# Patient Record
Sex: Female | Born: 1940
Health system: Southern US, Community
[De-identification: ages and names within clinical notes are randomized; demographics above are authoritative.]

## PROBLEM LIST (undated history)

## (undated) DIAGNOSIS — E876 Hypokalemia: Secondary | ICD-10-CM

## (undated) DIAGNOSIS — R748 Abnormal levels of other serum enzymes: Secondary | ICD-10-CM

## (undated) DIAGNOSIS — G709 Myoneural disorder, unspecified: Secondary | ICD-10-CM

## (undated) DIAGNOSIS — R011 Cardiac murmur, unspecified: Secondary | ICD-10-CM

## (undated) DIAGNOSIS — M1710 Unilateral primary osteoarthritis, unspecified knee: Secondary | ICD-10-CM

## (undated) DIAGNOSIS — I4891 Unspecified atrial fibrillation: Secondary | ICD-10-CM

## (undated) DIAGNOSIS — J309 Allergic rhinitis, unspecified: Secondary | ICD-10-CM

## (undated) DIAGNOSIS — Z8719 Personal history of other diseases of the digestive system: Secondary | ICD-10-CM

## (undated) DIAGNOSIS — R978 Other abnormal tumor markers: Secondary | ICD-10-CM

## (undated) DIAGNOSIS — H269 Unspecified cataract: Secondary | ICD-10-CM

## (undated) DIAGNOSIS — E119 Type 2 diabetes mellitus without complications: Secondary | ICD-10-CM

## (undated) DIAGNOSIS — M48061 Spinal stenosis, lumbar region without neurogenic claudication: Secondary | ICD-10-CM

## (undated) DIAGNOSIS — T7840XA Allergy, unspecified, initial encounter: Secondary | ICD-10-CM

## (undated) DIAGNOSIS — I10 Essential (primary) hypertension: Secondary | ICD-10-CM

## (undated) DIAGNOSIS — K219 Gastro-esophageal reflux disease without esophagitis: Secondary | ICD-10-CM

## (undated) DIAGNOSIS — M810 Age-related osteoporosis without current pathological fracture: Secondary | ICD-10-CM

## (undated) DIAGNOSIS — E785 Hyperlipidemia, unspecified: Secondary | ICD-10-CM

## (undated) DIAGNOSIS — M545 Low back pain: Secondary | ICD-10-CM

## (undated) DIAGNOSIS — E039 Hypothyroidism, unspecified: Secondary | ICD-10-CM

## (undated) HISTORY — DX: Allergic rhinitis, unspecified: J30.9

## (undated) HISTORY — DX: Hypokalemia: E87.6

## (undated) HISTORY — DX: Gastro-esophageal reflux disease without esophagitis: K21.9

## (undated) HISTORY — DX: Age-related osteoporosis without current pathological fracture: M81.0

## (undated) HISTORY — DX: Unspecified atrial fibrillation: I48.91

## (undated) HISTORY — DX: Personal history of other diseases of the digestive system: Z87.19

## (undated) HISTORY — PX: BACK SURGERY: SHX140

## (undated) HISTORY — DX: Myoneural disorder, unspecified: G70.9

## (undated) HISTORY — DX: Type 2 diabetes mellitus without complications: E11.9

## (undated) HISTORY — DX: Allergy, unspecified, initial encounter: T78.40XA

## (undated) HISTORY — PX: JOINT REPLACEMENT: SHX530

## (undated) HISTORY — DX: Hyperlipidemia, unspecified: E78.5

## (undated) HISTORY — DX: Unspecified cataract: H26.9

## (undated) HISTORY — DX: Other abnormal tumor markers: R97.8

## (undated) HISTORY — DX: Hypothyroidism, unspecified: E03.9

## (undated) HISTORY — DX: Low back pain: M54.5

## (undated) HISTORY — DX: Unilateral primary osteoarthritis, unspecified knee: M17.10

## (undated) HISTORY — DX: Essential (primary) hypertension: I10

## (undated) HISTORY — DX: Spinal stenosis, lumbar region without neurogenic claudication: M48.061

---

## 1997-09-09 ENCOUNTER — Ambulatory Visit (HOSPITAL_COMMUNITY): Admission: RE | Admit: 1997-09-09 | Discharge: 1997-09-09 | Payer: Self-pay | Admitting: *Deleted

## 1999-03-28 ENCOUNTER — Encounter: Payer: Self-pay | Admitting: Family Medicine

## 1999-03-28 ENCOUNTER — Ambulatory Visit (HOSPITAL_COMMUNITY): Admission: RE | Admit: 1999-03-28 | Discharge: 1999-03-28 | Payer: Self-pay | Admitting: Family Medicine

## 1999-08-02 ENCOUNTER — Encounter: Payer: Self-pay | Admitting: *Deleted

## 1999-08-02 ENCOUNTER — Encounter: Admission: RE | Admit: 1999-08-02 | Discharge: 1999-08-02 | Payer: Self-pay | Admitting: *Deleted

## 1999-08-15 ENCOUNTER — Encounter: Admission: RE | Admit: 1999-08-15 | Discharge: 1999-08-15 | Payer: Self-pay | Admitting: *Deleted

## 1999-08-15 ENCOUNTER — Encounter: Payer: Self-pay | Admitting: *Deleted

## 1999-08-22 ENCOUNTER — Other Ambulatory Visit: Admission: RE | Admit: 1999-08-22 | Discharge: 1999-08-22 | Payer: Self-pay | Admitting: Obstetrics and Gynecology

## 2000-08-26 ENCOUNTER — Encounter: Payer: Self-pay | Admitting: Internal Medicine

## 2000-08-26 ENCOUNTER — Ambulatory Visit (HOSPITAL_COMMUNITY): Admission: RE | Admit: 2000-08-26 | Discharge: 2000-08-26 | Payer: Self-pay | Admitting: Internal Medicine

## 2000-10-01 ENCOUNTER — Other Ambulatory Visit: Admission: RE | Admit: 2000-10-01 | Discharge: 2000-10-01 | Payer: Self-pay | Admitting: Obstetrics and Gynecology

## 2001-08-27 ENCOUNTER — Encounter: Payer: Self-pay | Admitting: Internal Medicine

## 2001-08-27 ENCOUNTER — Ambulatory Visit (HOSPITAL_COMMUNITY): Admission: RE | Admit: 2001-08-27 | Discharge: 2001-08-27 | Payer: Self-pay | Admitting: Internal Medicine

## 2001-10-06 ENCOUNTER — Other Ambulatory Visit: Admission: RE | Admit: 2001-10-06 | Discharge: 2001-10-06 | Payer: Self-pay | Admitting: Obstetrics and Gynecology

## 2002-07-18 ENCOUNTER — Emergency Department (HOSPITAL_COMMUNITY): Admission: EM | Admit: 2002-07-18 | Discharge: 2002-07-18 | Payer: Self-pay | Admitting: Emergency Medicine

## 2002-07-18 ENCOUNTER — Encounter: Payer: Self-pay | Admitting: Emergency Medicine

## 2002-12-31 ENCOUNTER — Encounter: Admission: RE | Admit: 2002-12-31 | Discharge: 2002-12-31 | Payer: Self-pay | Admitting: Internal Medicine

## 2002-12-31 ENCOUNTER — Encounter: Payer: Self-pay | Admitting: Internal Medicine

## 2003-02-23 ENCOUNTER — Other Ambulatory Visit: Admission: RE | Admit: 2003-02-23 | Discharge: 2003-02-23 | Payer: Self-pay | Admitting: Obstetrics and Gynecology

## 2003-02-23 ENCOUNTER — Encounter: Payer: Self-pay | Admitting: Obstetrics and Gynecology

## 2003-02-23 ENCOUNTER — Ambulatory Visit (HOSPITAL_COMMUNITY): Admission: RE | Admit: 2003-02-23 | Discharge: 2003-02-23 | Payer: Self-pay | Admitting: Obstetrics and Gynecology

## 2004-03-06 ENCOUNTER — Ambulatory Visit (HOSPITAL_COMMUNITY): Admission: RE | Admit: 2004-03-06 | Discharge: 2004-03-06 | Payer: Self-pay | Admitting: Obstetrics and Gynecology

## 2004-04-09 ENCOUNTER — Other Ambulatory Visit: Admission: RE | Admit: 2004-04-09 | Discharge: 2004-04-09 | Payer: Self-pay | Admitting: Obstetrics and Gynecology

## 2004-05-09 ENCOUNTER — Inpatient Hospital Stay (HOSPITAL_COMMUNITY): Admission: RE | Admit: 2004-05-09 | Discharge: 2004-05-12 | Payer: Self-pay | Admitting: Orthopedic Surgery

## 2004-07-15 HISTORY — PX: TOTAL KNEE ARTHROPLASTY: SHX125

## 2005-04-30 ENCOUNTER — Ambulatory Visit (HOSPITAL_COMMUNITY): Admission: RE | Admit: 2005-04-30 | Discharge: 2005-04-30 | Payer: Self-pay | Admitting: Internal Medicine

## 2005-08-30 ENCOUNTER — Other Ambulatory Visit: Admission: RE | Admit: 2005-08-30 | Discharge: 2005-08-30 | Payer: Self-pay | Admitting: Internal Medicine

## 2006-04-14 LAB — HM COLONOSCOPY: HM Colonoscopy: NORMAL

## 2006-05-20 ENCOUNTER — Ambulatory Visit (HOSPITAL_COMMUNITY): Admission: RE | Admit: 2006-05-20 | Discharge: 2006-05-20 | Payer: Self-pay | Admitting: Internal Medicine

## 2007-04-29 ENCOUNTER — Encounter: Payer: Self-pay | Admitting: Internal Medicine

## 2007-04-29 ENCOUNTER — Ambulatory Visit: Payer: Self-pay | Admitting: Internal Medicine

## 2007-04-29 DIAGNOSIS — E785 Hyperlipidemia, unspecified: Secondary | ICD-10-CM | POA: Insufficient documentation

## 2007-04-29 DIAGNOSIS — E039 Hypothyroidism, unspecified: Secondary | ICD-10-CM | POA: Insufficient documentation

## 2007-04-29 DIAGNOSIS — M545 Low back pain, unspecified: Secondary | ICD-10-CM

## 2007-04-29 DIAGNOSIS — I10 Essential (primary) hypertension: Secondary | ICD-10-CM

## 2007-04-29 DIAGNOSIS — M48061 Spinal stenosis, lumbar region without neurogenic claudication: Secondary | ICD-10-CM

## 2007-04-29 DIAGNOSIS — Z8719 Personal history of other diseases of the digestive system: Secondary | ICD-10-CM

## 2007-04-29 DIAGNOSIS — J309 Allergic rhinitis, unspecified: Secondary | ICD-10-CM

## 2007-04-29 HISTORY — DX: Personal history of other diseases of the digestive system: Z87.19

## 2007-04-29 HISTORY — DX: Hyperlipidemia, unspecified: E78.5

## 2007-04-29 HISTORY — DX: Spinal stenosis, lumbar region without neurogenic claudication: M48.061

## 2007-04-29 HISTORY — DX: Allergic rhinitis, unspecified: J30.9

## 2007-04-29 HISTORY — DX: Low back pain, unspecified: M54.50

## 2007-04-29 HISTORY — DX: Essential (primary) hypertension: I10

## 2007-04-29 HISTORY — DX: Hypothyroidism, unspecified: E03.9

## 2007-04-29 LAB — CONVERTED CEMR LAB
AST: 47 units/L — ABNORMAL HIGH (ref 0–37)
Basophils Absolute: 0 10*3/uL (ref 0.0–0.1)
Bilirubin Urine: NEGATIVE
Bilirubin, Direct: 0.3 mg/dL (ref 0.0–0.3)
Chloride: 104 meq/L (ref 96–112)
Creatinine, Ser: 0.8 mg/dL (ref 0.4–1.2)
Glucose, Bld: 228 mg/dL — ABNORMAL HIGH (ref 70–99)
HCT: 43.6 % (ref 36.0–46.0)
Hemoglobin, Urine: NEGATIVE
Hemoglobin: 15 g/dL (ref 12.0–15.0)
Hgb A1c MFr Bld: 9.9 % — ABNORMAL HIGH (ref 4.6–6.0)
Lymphocytes Relative: 31.4 % (ref 12.0–46.0)
MCHC: 34.4 g/dL (ref 30.0–36.0)
MCV: 91.1 fL (ref 78.0–100.0)
Monocytes Absolute: 0.5 10*3/uL (ref 0.2–0.7)
Neutrophils Relative %: 55.3 % (ref 43.0–77.0)
Nitrite: NEGATIVE
RDW: 12.4 % (ref 11.5–14.6)
Sodium: 141 meq/L (ref 135–145)
Total Bilirubin: 1.2 mg/dL (ref 0.3–1.2)
Total Protein, Urine: NEGATIVE mg/dL
Total Protein: 6.7 g/dL (ref 6.0–8.3)
pH: 5.5 (ref 5.0–8.0)

## 2007-05-05 ENCOUNTER — Ambulatory Visit: Payer: Self-pay | Admitting: Internal Medicine

## 2007-05-12 ENCOUNTER — Encounter: Payer: Self-pay | Admitting: Internal Medicine

## 2007-05-12 DIAGNOSIS — M810 Age-related osteoporosis without current pathological fracture: Secondary | ICD-10-CM

## 2007-05-12 HISTORY — DX: Age-related osteoporosis without current pathological fracture: M81.0

## 2007-05-26 ENCOUNTER — Encounter: Payer: Self-pay | Admitting: Internal Medicine

## 2007-05-28 ENCOUNTER — Ambulatory Visit (HOSPITAL_COMMUNITY): Admission: RE | Admit: 2007-05-28 | Discharge: 2007-05-28 | Payer: Self-pay | Admitting: Obstetrics and Gynecology

## 2007-06-23 ENCOUNTER — Ambulatory Visit: Payer: Self-pay | Admitting: Internal Medicine

## 2007-06-23 DIAGNOSIS — E119 Type 2 diabetes mellitus without complications: Secondary | ICD-10-CM | POA: Insufficient documentation

## 2007-06-23 DIAGNOSIS — IMO0002 Reserved for concepts with insufficient information to code with codable children: Secondary | ICD-10-CM

## 2007-06-23 DIAGNOSIS — M1710 Unilateral primary osteoarthritis, unspecified knee: Secondary | ICD-10-CM

## 2007-06-23 DIAGNOSIS — E1122 Type 2 diabetes mellitus with diabetic chronic kidney disease: Secondary | ICD-10-CM | POA: Insufficient documentation

## 2007-06-23 HISTORY — DX: Reserved for concepts with insufficient information to code with codable children: IMO0002

## 2007-06-23 HISTORY — DX: Type 2 diabetes mellitus without complications: E11.9

## 2007-06-25 LAB — CONVERTED CEMR LAB
Calcium: 9.1 mg/dL (ref 8.4–10.5)
Chloride: 103 meq/L (ref 96–112)
GFR calc non Af Amer: 77 mL/min
Hgb A1c MFr Bld: 8.4 % — ABNORMAL HIGH (ref 4.6–6.0)
LDL Cholesterol: 93 mg/dL (ref 0–99)
VLDL: 14 mg/dL (ref 0–40)

## 2007-07-21 ENCOUNTER — Telehealth (INDEPENDENT_AMBULATORY_CARE_PROVIDER_SITE_OTHER): Payer: Self-pay | Admitting: *Deleted

## 2007-08-11 ENCOUNTER — Encounter: Payer: Self-pay | Admitting: Internal Medicine

## 2007-10-13 ENCOUNTER — Ambulatory Visit: Payer: Self-pay | Admitting: Internal Medicine

## 2007-10-13 LAB — CONVERTED CEMR LAB
CO2: 31 meq/L (ref 19–32)
Calcium: 8.7 mg/dL (ref 8.4–10.5)
Chloride: 106 meq/L (ref 96–112)
Glucose, Bld: 174 mg/dL — ABNORMAL HIGH (ref 70–99)
HDL: 58.5 mg/dL (ref >39.0)
Sodium: 141 meq/L (ref 135–145)
Triglycerides: 79 mg/dL (ref 0–149)

## 2007-10-20 ENCOUNTER — Ambulatory Visit: Payer: Self-pay | Admitting: Internal Medicine

## 2007-12-16 ENCOUNTER — Encounter: Payer: Self-pay | Admitting: Internal Medicine

## 2008-04-19 ENCOUNTER — Ambulatory Visit: Payer: Self-pay | Admitting: Internal Medicine

## 2008-04-20 LAB — CONVERTED CEMR LAB
AST: 32 units/L (ref 0–37)
Albumin: 3.7 g/dL (ref 3.5–5.2)
Alkaline Phosphatase: 52 units/L (ref 39–117)
Basophils Absolute: 0 10*3/uL (ref 0.0–0.1)
Bilirubin Urine: NEGATIVE
Chloride: 105 meq/L (ref 96–112)
Cholesterol: 173 mg/dL (ref 0–200)
Eosinophils Absolute: 0.1 10*3/uL (ref 0.0–0.7)
GFR calc Af Amer: 93 mL/min
GFR calc non Af Amer: 77 mL/min
HCT: 37.5 % (ref 36.0–46.0)
HDL: 56 mg/dL (ref >39.0)
Hemoglobin, Urine: NEGATIVE
Ketones, ur: NEGATIVE mg/dL
LDL Cholesterol: 98 mg/dL (ref 0–99)
MCHC: 34.2 g/dL (ref 30.0–36.0)
MCV: 93.4 fL (ref 78.0–100.0)
Monocytes Absolute: 0.4 10*3/uL (ref 0.1–1.0)
Neutrophils Relative %: 55.4 % (ref 43.0–77.0)
Platelets: 185 10*3/uL (ref 150–400)
Potassium: 4.4 meq/L (ref 3.5–5.1)
RDW: 12.9 % (ref 11.5–14.6)
Sodium: 143 meq/L (ref 135–145)
Total Bilirubin: 1 mg/dL (ref 0.3–1.2)
Urine Glucose: NEGATIVE mg/dL
Urobilinogen, UA: 0.2 (ref 0.0–1.0)
VLDL: 19 mg/dL (ref 0–40)
WBC: 3.1 10*3/uL — ABNORMAL LOW (ref 4.5–10.5)

## 2008-04-27 ENCOUNTER — Ambulatory Visit: Payer: Self-pay | Admitting: Internal Medicine

## 2008-05-31 ENCOUNTER — Ambulatory Visit (HOSPITAL_COMMUNITY): Admission: RE | Admit: 2008-05-31 | Discharge: 2008-05-31 | Payer: Self-pay | Admitting: Obstetrics and Gynecology

## 2008-08-16 ENCOUNTER — Encounter: Payer: Self-pay | Admitting: Internal Medicine

## 2008-10-18 ENCOUNTER — Ambulatory Visit: Payer: Self-pay | Admitting: Internal Medicine

## 2008-10-18 LAB — CONVERTED CEMR LAB
BUN: 23 mg/dL (ref 6–23)
Chloride: 106 meq/L (ref 96–112)
Cholesterol: 191 mg/dL (ref 0–200)
GFR calc non Af Amer: 76.28 mL/min (ref >60)
Glucose, Bld: 162 mg/dL — ABNORMAL HIGH (ref 70–99)
HDL: 56.4 mg/dL (ref >39.00)
Hgb A1c MFr Bld: 6.7 % — ABNORMAL HIGH (ref 4.6–6.5)
LDL Cholesterol: 117 mg/dL — ABNORMAL HIGH (ref 0–99)
Potassium: 3.8 meq/L (ref 3.5–5.1)
Sodium: 138 meq/L (ref 135–145)
VLDL: 18 mg/dL (ref 0.0–40.0)

## 2008-10-25 ENCOUNTER — Ambulatory Visit: Payer: Self-pay | Admitting: Internal Medicine

## 2009-03-16 ENCOUNTER — Encounter: Payer: Self-pay | Admitting: Internal Medicine

## 2009-04-26 ENCOUNTER — Ambulatory Visit: Payer: Self-pay | Admitting: Internal Medicine

## 2009-04-26 LAB — CONVERTED CEMR LAB
ALT: 36 units/L — ABNORMAL HIGH (ref 0–35)
AST: 38 units/L — ABNORMAL HIGH (ref 0–37)
Albumin: 3.9 g/dL (ref 3.5–5.2)
Basophils Relative: 0.4 % (ref 0.0–3.0)
Bilirubin Urine: NEGATIVE
CO2: 30 meq/L (ref 19–32)
Calcium: 9.3 mg/dL (ref 8.4–10.5)
Creatinine, Ser: 0.9 mg/dL (ref 0.4–1.2)
Creatinine,U: 224 mg/dL
Eosinophils Relative: 4.1 % (ref 0.0–5.0)
GFR calc non Af Amer: 66.48 mL/min (ref >60)
Glucose, Bld: 150 mg/dL — ABNORMAL HIGH (ref 70–99)
HCT: 40.4 % (ref 36.0–46.0)
Hemoglobin, Urine: NEGATIVE
Hemoglobin: 13.9 g/dL (ref 12.0–15.0)
Ketones, ur: NEGATIVE mg/dL
Leukocytes, UA: NEGATIVE
Lymphs Abs: 0.9 10*3/uL (ref 0.7–4.0)
MCV: 94.2 fL (ref 78.0–100.0)
Monocytes Absolute: 0.4 10*3/uL (ref 0.1–1.0)
Monocytes Relative: 11.2 % (ref 3.0–12.0)
Neutro Abs: 2.2 10*3/uL (ref 1.4–7.7)
RBC: 4.29 M/uL (ref 3.87–5.11)
Sodium: 140 meq/L (ref 135–145)
TSH: 0.61 microintl units/mL (ref 0.35–5.50)
Total Bilirubin: 1.1 mg/dL (ref 0.3–1.2)
Total CHOL/HDL Ratio: 4
Total Protein: 6.8 g/dL (ref 6.0–8.3)
WBC: 3.6 10*3/uL — ABNORMAL LOW (ref 4.5–10.5)
pH: 6 (ref 5.0–8.0)

## 2009-05-02 ENCOUNTER — Ambulatory Visit: Payer: Self-pay | Admitting: Internal Medicine

## 2009-05-02 DIAGNOSIS — E876 Hypokalemia: Secondary | ICD-10-CM | POA: Insufficient documentation

## 2009-05-02 HISTORY — DX: Hypokalemia: E87.6

## 2009-06-01 ENCOUNTER — Ambulatory Visit (HOSPITAL_COMMUNITY): Admission: RE | Admit: 2009-06-01 | Discharge: 2009-06-01 | Payer: Self-pay | Admitting: Internal Medicine

## 2009-06-20 ENCOUNTER — Ambulatory Visit: Payer: Self-pay | Admitting: Internal Medicine

## 2009-06-20 ENCOUNTER — Encounter: Payer: Self-pay | Admitting: Internal Medicine

## 2009-07-15 HISTORY — PX: OTHER SURGICAL HISTORY: SHX169

## 2009-10-24 ENCOUNTER — Ambulatory Visit: Payer: Self-pay | Admitting: Internal Medicine

## 2009-10-24 LAB — CONVERTED CEMR LAB
Chloride: 102 meq/L (ref 96–112)
Cholesterol: 174 mg/dL (ref 0–200)
GFR calc non Af Amer: 66.38 mL/min (ref >60)
Glucose, Bld: 144 mg/dL — ABNORMAL HIGH (ref 70–99)
HDL: 55.8 mg/dL (ref >39.00)
Hgb A1c MFr Bld: 6.5 % (ref 4.6–6.5)
LDL Cholesterol: 93 mg/dL (ref 0–99)
Potassium: 3.6 meq/L (ref 3.5–5.1)
Sodium: 141 meq/L (ref 135–145)
VLDL: 25.6 mg/dL (ref 0.0–40.0)

## 2009-10-31 ENCOUNTER — Telehealth: Payer: Self-pay | Admitting: Internal Medicine

## 2009-10-31 ENCOUNTER — Ambulatory Visit: Payer: Self-pay | Admitting: Internal Medicine

## 2009-11-06 ENCOUNTER — Telehealth: Payer: Self-pay | Admitting: Internal Medicine

## 2009-11-23 ENCOUNTER — Encounter: Payer: Self-pay | Admitting: Internal Medicine

## 2010-02-20 ENCOUNTER — Telehealth: Payer: Self-pay | Admitting: Internal Medicine

## 2010-02-20 DIAGNOSIS — M79609 Pain in unspecified limb: Secondary | ICD-10-CM | POA: Insufficient documentation

## 2010-03-12 ENCOUNTER — Encounter: Payer: Self-pay | Admitting: Internal Medicine

## 2010-03-20 ENCOUNTER — Telehealth (INDEPENDENT_AMBULATORY_CARE_PROVIDER_SITE_OTHER): Payer: Self-pay | Admitting: *Deleted

## 2010-05-01 ENCOUNTER — Ambulatory Visit: Payer: Self-pay | Admitting: Internal Medicine

## 2010-05-01 LAB — CONVERTED CEMR LAB
ALT: 27 units/L (ref 0–35)
AST: 35 units/L (ref 0–37)
Albumin: 3.9 g/dL (ref 3.5–5.2)
Basophils Relative: 0.6 % (ref 0.0–3.0)
Bilirubin Urine: NEGATIVE
Eosinophils Relative: 4.6 % (ref 0.0–5.0)
GFR calc non Af Amer: 68.92 mL/min (ref >60)
HCT: 36.5 % (ref 36.0–46.0)
Hemoglobin: 12.9 g/dL (ref 12.0–15.0)
Ketones, ur: NEGATIVE mg/dL
LDL Cholesterol: 107 mg/dL — ABNORMAL HIGH (ref 0–99)
Lymphs Abs: 0.9 10*3/uL (ref 0.7–4.0)
Monocytes Relative: 9 % (ref 3.0–12.0)
Neutro Abs: 2 10*3/uL (ref 1.4–7.7)
Potassium: 3.9 meq/L (ref 3.5–5.1)
Sodium: 141 meq/L (ref 135–145)
TSH: 0.17 microintl units/mL — ABNORMAL LOW (ref 0.35–5.50)
Total Bilirubin: 0.9 mg/dL (ref 0.3–1.2)
VLDL: 21.6 mg/dL (ref 0.0–40.0)
WBC: 3.4 10*3/uL — ABNORMAL LOW (ref 4.5–10.5)
pH: 6 (ref 5.0–8.0)

## 2010-05-08 ENCOUNTER — Ambulatory Visit: Payer: Self-pay | Admitting: Internal Medicine

## 2010-05-08 ENCOUNTER — Telehealth: Payer: Self-pay | Admitting: Internal Medicine

## 2010-05-09 ENCOUNTER — Telehealth: Payer: Self-pay | Admitting: Internal Medicine

## 2010-06-19 ENCOUNTER — Ambulatory Visit: Payer: Self-pay | Admitting: Internal Medicine

## 2010-06-26 LAB — CONVERTED CEMR LAB
GFR calc non Af Amer: 60.76 mL/min (ref >60.00)
Glucose, Bld: 112 mg/dL — ABNORMAL HIGH (ref 70–99)
Hgb A1c MFr Bld: 6.1 % (ref 4.6–6.5)
Potassium: 3.9 meq/L (ref 3.5–5.1)
Sodium: 140 meq/L (ref 135–145)
TSH: 2.11 microintl units/mL (ref 0.35–5.50)

## 2010-07-15 HISTORY — PX: TOTAL KNEE ARTHROPLASTY: SHX125

## 2010-08-14 ENCOUNTER — Encounter: Payer: Self-pay | Admitting: Internal Medicine

## 2010-08-15 ENCOUNTER — Telehealth: Payer: Self-pay | Admitting: Internal Medicine

## 2010-08-16 NOTE — Medication Information (Signed)
Summary: [facility]   Imported By: Lester Roundup 05/10/2010 08:56:28  _____________________________________________________________________  External Attachment:    Type:   Image     Comment:   External Document

## 2010-08-16 NOTE — Letter (Signed)
Summary: [facility]  [facility]   Imported By: Lennie Odor 12/01/2009 15:54:59  _____________________________________________________________________  External Attachment:    Type:   Image     Comment:   External Document

## 2010-08-16 NOTE — Assessment & Plan Note (Signed)
Summary: 6 mos f/u #/cd   Vital Signs:  Patient profile:   70 year old female Height:      66.5 inches Weight:      270.75 pounds BMI:     43.20 O2 Sat:      98 % on Room air Temp:     97.8 degrees F oral Pulse rate:   57 / minute BP sitting:   122 / 62  (left arm) Cuff size:   large  Vitals Entered ByZella Ball Ewing (October 31, 2009 9:14 AM)  O2 Flow:  Room air  CC: 6 Month Followup/RE   CC:  6 Month Followup/RE.  History of Present Illness: did not take the potassium pills;  lost her job in [place] and has been working on diet and  exercise, now with wanted wt loss as a result ;  pt states lost 19 lbs - chart show 12 lb wt loss since last visit 6 mo ago;  walks 1 mile per day and gym 3 times per wk;  had some depression to start after the job loss, but now feels more secure, less depressed with soc secuirty and pension; Pt denies CP, sob, doe, wheezing, orthopnea, pnd, worsening LE edema, palps, dizziness or syncope   Pt denies new neuro symptoms such as headache, facial or extremity weakness  .  Pt denies polydipsia, polyuria, or low sugar symptoms such as shakiness improved with eating.  Overall good compliance with meds, trying to follow low chol, DM diet.   Hearing: Intact bilateral except with background noise situation Visual Acuity: Grossly normal, gets exam yearly, wears glasses End-of-Life Planning: Advance directive - DNR per pt/has living will as well   Problems Prior to Update: 1)  Hypokalemia  (ICD-276.8) 2)  Preventive Health Care  (ICD-V70.0) 3)  Osteoarthritis, Knee, Left  (ICD-715.96) 4)  Diabetes Mellitus, Type II  (ICD-250.00) 5)  Osteoporosis Nos  (ICD-733.00) 6)  Preventive Health Care  (ICD-V70.0) 7)  Family History Diabetes 1st Degree Relative  (ICD-V18.0) 8)  Family History of Cad Female 1st Degree Relative <50  (ICD-V17.3) 9)  Allergic Rhinitis  (ICD-477.9) 10)  Hiatal Hernia, Hx of  (ICD-V12.79) 11)  Spinal Stenosis, Lumbar  (ICD-724.02) 12)  Low Back  Pain  (ICD-724.2) 13)  Hypothyroidism  (ICD-244.9) 14)  Hypertension  (ICD-401.9) 15)  Hyperlipidemia  (ICD-272.4)  Medications Prior to Update: 1)  Meloxicam 15 Mg Tabs (Meloxicam) .Marland Kitchen.. 1 By Mouth Once Daily 2)  Levothroid 175 Mcg  Tabs (Levothyroxine Sodium) .Marland Kitchen.. 1 By Mouth Once Daily 3)  Atenolol-Chlorthalidone 50-25 Mg Tabs (Atenolol-Chlorthalidone) .Marland Kitchen.. 1 By Mouth Once Daily 4)  Actos 45 Mg  Tabs (Pioglitazone Hcl) .Marland Kitchen.. 1 By Mouth Once Daily 5)  Alendronate Sodium 70 Mg  Tabs (Alendronate Sodium) .Marland Kitchen.. 1 By Mouth Q Wk 6)  Onetouch Ultra Test   Strp (Glucose Blood) .... Use Asd 1 Two Times A Day 7)  Lancets   Misc (Lancets) .... Use Asd Two Times A Day 8)  Metformin Hcl 500 Mg  Tabs (Metformin Hcl) .... 2 By Mouth Once Daily 9)  Adult Aspirin Ec Low Strength 81 Mg Tbec (Aspirin) .Marland Kitchen.. 1po Once Daily 10)  Klor-Con 10 10 Meq Cr-Tabs (Potassium Chloride) .Marland Kitchen.. 1 By Mouth Once Daily  Current Medications (verified): 1)  Meloxicam 15 Mg Tabs (Meloxicam) .Marland Kitchen.. 1 By Mouth Once Daily 2)  Levothroid 175 Mcg  Tabs (Levothyroxine Sodium) .Marland Kitchen.. 1 By Mouth Once Daily 3)  Atenolol-Chlorthalidone 50-25 Mg Tabs (Atenolol-Chlorthalidone) .Marland Kitchen.. 1 By  Mouth Once Daily 4)  Actos 45 Mg  Tabs (Pioglitazone Hcl) .Marland Kitchen.. 1 By Mouth Once Daily 5)  Alendronate Sodium 70 Mg  Tabs (Alendronate Sodium) .Marland Kitchen.. 1 By Mouth Q Wk 6)  Onetouch Ultra Test   Strp (Glucose Blood) .... Use Asd 1 Two Times A Day 7)  Lancets   Misc (Lancets) .... Use Asd Two Times A Day 8)  Metformin Hcl 500 Mg  Tabs (Metformin Hcl) .... 2 By Mouth Once Daily 9)  Adult Aspirin Ec Low Strength 81 Mg Tbec (Aspirin) .Marland Kitchen.. 1po Once Daily 10)  Klor-Con 10 10 Meq Cr-Tabs (Potassium Chloride) .Marland Kitchen.. 1 By Mouth Once Daily  Allergies (verified): No Known Drug Allergies  Directives (verified): 1)  Do Not Resuscitate   Past History:  Past Medical History: Last updated: 10/20/2007 Hyperlipidemia Hypertension Hypothyroidism Low back pain/spinal  stenosis hiatal hernia Allergic rhinitis Diabetes mellitus, type II left knee DJD  Past Surgical History: Last updated: 10/20/2007 Total knee replacement - right - 2006  Social History: Last updated: 10/20/2007 Never Smoked Alcohol use-yes 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, materials at the [employer]  Risk Factors: Smoking Status: never (04/29/2007)  Family History: Reviewed history from 04/27/2008 and no changes required. Family History of CAD Female 1st degree relative Family History Diabetes 1st degree relative  Social History: Reviewed history from 10/20/2007 and no changes required. 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   Head:  normocephalic and atraumatic.   Eyes:  vision grossly intact, pupils equal, and pupils round.   Ears:  R ear normal and L ear normal.   Nose:  no external deformity and no nasal discharge.   Mouth:  no gingival abnormalities and pharynx pink and moist.   Neck:  supple and no masses.   Lungs:  normal respiratory effort and normal breath sounds.   Heart:  normal rate and regular rhythm.   Extremities:  no edema, no erythema  Skin:  color normal and no rashes.   Psych:  not anxious appearing and not depressed appearing.     Impression & Recommendations:  Problem # 1:  OSTEOPOROSIS NOS (ICD-733.00)  Her updated medication list for this problem includes:    Alendronate Sodium 70 Mg Tabs (Alendronate sodium) .Marland Kitchen... 1 by mouth q wk d/w pt option of prolia, has only been on alendronate  for approx 2 yrs, will cont same tx for now  Problem # 2:  DIABETES MELLITUS, TYPE II (ICD-250.00)  Her updated medication list for this problem includes:    Actos 45 Mg Tabs (Pioglitazone hcl) .Marland Kitchen... 1 by mouth once daily    Metformin Hcl 500 Mg Tabs (Metformin hcl) .Marland Kitchen... 2 by mouth once daily    Adult Aspirin Ec  Low Strength 81 Mg Tbec (Aspirin) .Marland Kitchen... 1po once daily  Labs Reviewed: Creat: 0.9 (10/24/2009)    Reviewed HgBA1c results: 6.5 (10/24/2009)  6.7 (04/26/2009) stable overall by hx and exam, ok to continue meds/tx as is   Problem # 3:  HYPERTENSION (ICD-401.9)  Her updated medication list for this problem includes:    Atenolol-chlorthalidone 50-25 Mg Tabs (Atenolol-chlorthalidone) .Marland Kitchen... 1 by mouth once daily  BP today: 122/62 Prior BP: 120/64 (05/02/2009)  Labs Reviewed: K+: 3.6 (10/24/2009) Creat: : 0.9 (10/24/2009)   Chol: 174 (10/24/2009)   HDL: 55.80 (10/24/2009)   LDL:  93 (10/24/2009)   TG: 128.0 (10/24/2009) stable overall by hx and exam, ok to continue meds/tx as is   Problem # 4:  HYPERLIPIDEMIA (ICD-272.4)  Labs Reviewed: SGOT: 38 (04/26/2009)   SGPT: 36 (04/26/2009)   HDL:55.80 (10/24/2009), 53.00 (04/26/2009)  LDL:93 (10/24/2009), 119 (14/78/2956)  Chol:174 (10/24/2009), 195 (04/26/2009)  Trig:128.0 (10/24/2009), 115.0 (04/26/2009) d/w pt - LDL goal < 70, she has been doing better with diet and wt loss and wants to cont as is for now  Complete Medication List: 1)  Meloxicam 15 Mg Tabs (Meloxicam) .Marland Kitchen.. 1 by mouth once daily 2)  Levothroid 175 Mcg Tabs (Levothyroxine sodium) .Marland Kitchen.. 1 by mouth once daily 3)  Atenolol-chlorthalidone 50-25 Mg Tabs (Atenolol-chlorthalidone) .Marland Kitchen.. 1 by mouth once daily 4)  Actos 45 Mg Tabs (Pioglitazone hcl) .Marland Kitchen.. 1 by mouth once daily 5)  Alendronate Sodium 70 Mg Tabs (Alendronate sodium) .Marland Kitchen.. 1 by mouth q wk 6)  Onetouch Ultra Test Strp (Glucose blood) .... Use asd 1 two times a day 7)  Lancets Misc (Lancets) .... Use asd two times a day 8)  Metformin Hcl 500 Mg Tabs (Metformin hcl) .... 2 by mouth once daily 9)  Adult Aspirin Ec Low Strength 81 Mg Tbec (Aspirin) .Marland Kitchen.. 1po once daily 10)  Klor-con 10 10 Meq Cr-tabs (Potassium chloride) .Marland Kitchen.. 1 by mouth once daily  Patient Instructions: 1)  Continue all previous medications as before this  visit 2)   consider change of fosamax to Prolia in the future 3)  Continue all previous medications as before this visit  4)  Please schedule a follow-up appointment in 6 months. 5)  (your BP med was sent to the pharmacy on the computer) 6)  we'll do you lab work at your next office visit Prescriptions: ATENOLOL-CHLORTHALIDONE 50-25 MG TABS (ATENOLOL-CHLORTHALIDONE) 1 by mouth once daily  #90 x 3   Entered and Authorized by:   Corwin Levins MD   Signed by:   Corwin Levins MD on 10/31/2009   Method used:   Electronically to        [facility]  [phone]* (retail)       [address]       [place], [place]  [address]       Ph: [phone] or [phone]       Fax: [phone]   RxID:   [id number]

## 2010-08-16 NOTE — Progress Notes (Signed)
----   Converted from flag ---- ---- 11/04/2009 9:43 PM, Corwin Levins MD wrote: please arrange for CPX labs to be done prior to next visit in 6 mo (has [employer] insurance - should be ok) ------------------------------  called pt left msg to call back 11/06/2009   called pt left msg to call back 11/06/2009  called pt scheduled for CPX v70.0 labs for May 01, 2010 11/06/2009

## 2010-08-16 NOTE — Progress Notes (Signed)
  Phone Note Refill Request  on October 31, 2009 10:36 AM  Refills Requested: Medication #1:  ATENOLOL-CHLORTHALIDONE 50-25 MG TABS 1 by mouth once daily   Dosage confirmed as above?Dosage Confirmed   Notes: [facility] Initial call taken by: Scharlene Gloss,  October 31, 2009 10:36 AM    Prescriptions: ATENOLOL-CHLORTHALIDONE 50-25 MG TABS (ATENOLOL-CHLORTHALIDONE) 1 by mouth once daily  #90 x 3   Entered by:   Scharlene Gloss   Authorized by:   Corwin Levins MD   Signed by:   Scharlene Gloss on 10/31/2009   Method used:   Faxed to ...       [facility] (retail)       [address]       [place], [place]  [address]       Ph: [phone]       Fax: [phone]   RxID:   [id number]

## 2010-08-16 NOTE — Consult Note (Signed)
Summary: [facility]   Imported By: Lennie Odor 03/20/2010 14:24:38  _____________________________________________________________________  External Attachment:    Type:   Image     Comment:   External Document

## 2010-08-16 NOTE — Progress Notes (Signed)
Summary: Referral  Phone Note Call from Patient Call back at Home Phone [phone]   Caller: Patient Summary of Call: Pt called requesting referral to Podiatry for problems with nail on her LT foot. Initial call taken by: Ambry Pyle, CMA,  February 20, 2010 3:37 PM  Follow-up for Phone Call        ok - will do Follow-up by: Corwin Levins MD,  February 20, 2010 3:40 PM  Additional Follow-up for Phone Call Additional follow up Details #1::        Pt informed and will expect call from [facility] Additional Follow-up by: Celeste Pyle, CMA,  February 20, 2010 4:07 PM  New Problems: TOE PAIN (ICD-729.5)   New Problems: TOE PAIN (ICD-729.5)

## 2010-08-16 NOTE — Assessment & Plan Note (Signed)
Summary: 6 mo rov /nws   Vital Signs:  Patient profile:   70 year old female Height:      66.5 inches Weight:      261.50 pounds BMI:     41.73 O2 Sat:      98 % on Room air Temp:     97 degrees F oral Pulse rate:   54 / minute BP sitting:   110 / 70  (left arm) Cuff size:   large  Vitals Entered By: Zella Ball Ewing CMA Duncan Dull) (May 08, 2010 9:02 AM)  O2 Flow:  Room air  Preventive Care Screening  Pap Smear:    Date:  05/04/2010    Results:  normal   Bone Density:    Date:  06/30/2009    Results:  abnormal std dev  CC: 6 month ROV/RE   CC:  6 month ROV/RE.  History of Present Illness: here for wellness;  never took the K supplement; Pt denies CP, worsening sob, doe, wheezing, orthopnea, pnd, worsening LE edema, palps, dizziness or syncope Pt denies new neuro symptoms such as headache, facial or extremity weakness  Pt denies polydipsia, polyuria, or low sugar symptoms such as shakiness improved with eating.  Overall good compliance with meds, trying to follow low chol, DM diet, wt stable, little excercise however  No fever, wt loss, night sweats, loss of appetite or other constitutional symptoms  Denies worsening depressive symptoms, suicidal ideation, or panic.  Pt states good ability with ADL's, low fall risk, home safety reviewed and adequate, no significant change in hearing or vision, trying to follow lower chol diet, and occasionally active only with regular excercise.   Preventive Screening-Counseling & Management      Drug Use:  no.    Problems Prior to Update: 1)  Toe Pain  (ICD-729.5) 2)  Hypokalemia  (ICD-276.8) 3)  Preventive Health Care  (ICD-V70.0) 4)  Osteoarthritis, Knee, Left  (ICD-715.96) 5)  Diabetes Mellitus, Type II  (ICD-250.00) 6)  Osteoporosis Nos  (ICD-733.00) 7)  Preventive Health Care  (ICD-V70.0) 8)  Family History Diabetes 1st Degree Relative  (ICD-V18.0) 9)  Family History of Cad Female 1st Degree Relative <50  (ICD-V17.3) 10)  Allergic  Rhinitis  (ICD-477.9) 11)  Hiatal Hernia, Hx of  (ICD-V12.79) 12)  Spinal Stenosis, Lumbar  (ICD-724.02) 13)  Low Back Pain  (ICD-724.2) 14)  Hypothyroidism  (ICD-244.9) 15)  Hypertension  (ICD-401.9) 16)  Hyperlipidemia  (ICD-272.4)  Medications Prior to Update: 1)  Meloxicam 15 Mg Tabs (Meloxicam) .Marland Kitchen.. 1 By Mouth Once Daily 2)  Levothroid 175 Mcg  Tabs (Levothyroxine Sodium) .Marland Kitchen.. 1 By Mouth Once Daily 3)  Atenolol-Chlorthalidone 50-25 Mg Tabs (Atenolol-Chlorthalidone) .Marland Kitchen.. 1 By Mouth Once Daily 4)  Actos 45 Mg  Tabs (Pioglitazone Hcl) .Marland Kitchen.. 1 By Mouth Once Daily 5)  Alendronate Sodium 70 Mg  Tabs (Alendronate Sodium) .Marland Kitchen.. 1 By Mouth Q Wk 6)  Onetouch Ultra Test   Strp (Glucose Blood) .... Use Asd 1 Two Times A Day 7)  Lancets   Misc (Lancets) .... Use Asd Two Times A Day 8)  Metformin Hcl 500 Mg  Tabs (Metformin Hcl) .... 2 By Mouth Once Daily 9)  Adult Aspirin Ec Low Strength 81 Mg Tbec (Aspirin) .Marland Kitchen.. 1po Once Daily 10)  Klor-Con 10 10 Meq Cr-Tabs (Potassium Chloride) .Marland Kitchen.. 1 By Mouth Once Daily  Current Medications (verified): 1)  Meloxicam 15 Mg Tabs (Meloxicam) .Marland Kitchen.. 1 By Mouth Once Daily 2)  Levothyroxine Sodium 150 Mcg Tabs (Levothyroxine Sodium) .Marland KitchenMarland KitchenMarland Kitchen  1po Once Daily 3)  Atenolol-Chlorthalidone 50-25 Mg Tabs (Atenolol-Chlorthalidone) .Marland Kitchen.. 1 By Mouth Once Daily 4)  Actos 45 Mg  Tabs (Pioglitazone Hcl) .Marland Kitchen.. 1 By Mouth Once Daily 5)  Alendronate Sodium 70 Mg  Tabs (Alendronate Sodium) .Marland Kitchen.. 1 By Mouth Q Wk 6)  Onetouch Ultra Test   Strp (Glucose Blood) .... Use Asd 1 Two Times A Day 7)  Lancets   Misc (Lancets) .... Use Asd Two Times A Day 8)  Metformin Hcl 500 Mg  Tabs (Metformin Hcl) .... 2 By Mouth Once Daily 9)  Adult Aspirin Ec Low Strength 81 Mg Tbec (Aspirin) .Marland Kitchen.. 1po Once Daily 10)  Tums E-X 750 750 Mg Chew (Calcium Carbonate Antacid) .Marland Kitchen.. 1 By Mouth Two Times A Day  Allergies (verified): No Known Drug Allergies  Directives: 1)  Do Not Resuscitate   Past  History:  Past Medical History: Last updated: 10/20/2007 Hyperlipidemia Hypertension Hypothyroidism Low back pain/spinal stenosis hiatal hernia Allergic rhinitis Diabetes mellitus, type II left knee DJD  Past Surgical History: Last updated: 10/20/2007 Total knee replacement - right - 2006  Family History: Last updated: 04/27/2008 Family History of CAD Female 1st degree relative Family History Diabetes 1st degree relative  Social History: Last updated: 05/08/2010 Never Smoked Alcohol use-yes 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, 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  Risk Factors: Smoking Status: never (04/29/2007)  Social History: Never Smoked Alcohol use-yes 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         all otherwise negative per pt -    Physical Exam  General:  alert and overweight-appearing.   Head:  normocephalic and atraumatic.   Eyes:  vision grossly intact, pupils equal, and pupils round.   Ears:  R ear normal and L ear normal.   Nose:  no external deformity and no nasal discharge.   Mouth:  no gingival abnormalities and pharynx pink and moist.   Neck:  supple and no masses.   Lungs:  normal respiratory effort and normal breath sounds.   Heart:  normal rate and regular rhythm.   Abdomen:  soft, non-tender, and normal bowel sounds.   Msk:  no joint tenderness and no joint swelling.   Extremities:  no edema, no erythema  Neurologic:  cranial nerves II-XII intact and strength normal in all  extremities.   Skin:  color normal and no rashes.   Psych:  not anxious appearing and not depressed appearing.     Impression & Recommendations:  Problem # 1:  Preventive Health Care (ICD-V70.0) Overall doing well, age appropriate education and counseling updated, referral for preventive services and immunizations addressed, dietary counseling and smoking status adressed , most recent labs reviewed, ecg declined I have personally reviewed and have noted 1.The patient's medical and social history 2.Their use of alcohol, tobacco or illicit drugs 3.Their current medications and supplements 4. Functional ability including ADL's, fall risk, home safety risk, hearing & visual impairment  5.Diet and physical activities 6.Evidence for depression or mood disorders The patients weight, height, BMI  have been recorded in the chart I have made referrals, counseling and provided education to the patient based  review of the above   Problem # 2:  DIABETES MELLITUS, TYPE II (ICD-250.00)  Her updated medication list for this problem includes:    Actos 45 Mg Tabs (Pioglitazone hcl) .Marland Kitchen... 1 by mouth once daily    Metformin Hcl 500 Mg Tabs (Metformin hcl) .Marland Kitchen... 2 by mouth once daily    Adult Aspirin Ec Low Strength 81 Mg Tbec (Aspirin) .Marland Kitchen... 1po once daily    Glimepiride 4 Mg Tabs (Glimepiride) .Marland Kitchen... 1 by mouth two times a day  Labs Reviewed: Creat: 0.9 (05/01/2010)    Reviewed HgBA1c results: 6.5 (10/24/2009)  6.7 (04/26/2009) treat as above, f/u any worsening signs or symptoms  - consider change of actos due to ? incr risk of bladder cancer  Problem # 3:  HYPOTHYROIDISM (ICD-244.9)  Her updated medication list for this problem includes:    Levothyroxine Sodium 150 Mcg Tabs (Levothyroxine sodium) .Marland Kitchen... 1po once daily overcontrolled - to decr to 150  Labs Reviewed: TSH: 0.17 (05/01/2010)    HgBA1c: 6.5 (10/24/2009) Chol: 193 (05/01/2010)   HDL: 64.50 (05/01/2010)   LDL: 107 (05/01/2010)   TG:  108.0 (05/01/2010)  Problem # 4:  OSTEOPOROSIS NOS (ICD-733.00)  Her updated medication list for this problem includes:    Alendronate Sodium 70 Mg Tabs (Alendronate sodium) .Marland Kitchen... 1 by mouth q wk consider change to prolia  Complete Medication List: 1)  Meloxicam 15 Mg Tabs (Meloxicam) .Marland Kitchen.. 1 by mouth once daily 2)  Levothyroxine Sodium 150 Mcg Tabs (Levothyroxine sodium) .Marland Kitchen.. 1po once daily 3)  Atenolol-chlorthalidone 50-25 Mg Tabs (Atenolol-chlorthalidone) .Marland Kitchen.. 1 by mouth once daily 4)  Actos 45 Mg Tabs (Pioglitazone hcl) .Marland Kitchen.. 1 by mouth once daily 5)  Alendronate Sodium 70 Mg Tabs (Alendronate sodium) .Marland Kitchen.. 1 by mouth q wk 6)  Onetouch Ultra Test Strp (Glucose blood) .... Use asd 1 two times a day 7)  Lancets Misc (Lancets) .... Use asd two times a day 8)  Metformin Hcl 500 Mg Tabs (Metformin hcl) .... 2 by mouth once daily 9)  Adult Aspirin Ec Low Strength 81 Mg Tbec (Aspirin) .Marland Kitchen.. 1po once daily 10)  Tums E-x 750 750 Mg Chew (Calcium carbonate antacid) .Marland Kitchen.. 1 by mouth two times a day 11)  Glimepiride 4 Mg Tabs (Glimepiride) .Marland Kitchen.. 1 by mouth two times a day  Patient Instructions: 1)  we will look into the prolia copay, and you should be called  2)  please contact your pharmacist about your copay with your insurance for the janumet 50/1000 two times a day   --   if it seems reasonable we can switch from the actos + metformin you are now taking 3)  please return in 4 wks for LAB only:   4)  BMP prior to visit, ICD-9: 250.02 5)  HbgA1C prior to visit, ICD-9: 250.02 6)  TSH prior to visit, ICD-9: 244.8 7)  Please schedule a follow-up appointment in 6 months with: 8)  BMP prior to visit, ICD-9: 250.02 9)  Lipid Panel prior to visit, ICD-9: 10)  HbgA1C prior to visit, ICD-9: Prescriptions: ONETOUCH ULTRA TEST   STRP (GLUCOSE BLOOD) use asd 1 two times a day  #200 x 11   Entered and Authorized by:   Corwin Levins MD   Signed by:   Corwin Levins MD on 05/08/2010   Method used:   Print then  Give to Patient   RxID:   [id number] METFORMIN HCL 500 MG  TABS (METFORMIN HCL) 2 by mouth once daily  #  180 x 3   Entered and Authorized by:   Corwin Levins MD   Signed by:   Corwin Levins MD on 05/08/2010   Method used:   Print then Give to Patient   RxID:   [id number] ALENDRONATE SODIUM 70 MG  TABS (ALENDRONATE SODIUM) 1 by mouth q wk  #12 x 3   Entered and Authorized by:   Corwin Levins MD   Signed by:   Corwin Levins MD on 05/08/2010   Method used:   Print then Give to Patient   RxID:   [id number] MELOXICAM 15 MG TABS (MELOXICAM) 1 by mouth once daily  #90 x 3   Entered and Authorized by:   Corwin Levins MD   Signed by:   Corwin Levins MD on 05/08/2010   Method used:   Print then Give to Patient   RxID:   [id number] ATENOLOL-CHLORTHALIDONE 50-25 MG TABS (ATENOLOL-CHLORTHALIDONE) 1 by mouth once daily  #90 x 3   Entered and Authorized by:   Corwin Levins MD   Signed by:   Corwin Levins MD on 05/08/2010   Method used:   Print then Give to Patient   RxID:   [id number] ACTOS 45 MG  TABS (PIOGLITAZONE HCL) 1 by mouth once daily  #90 x 3   Entered and Authorized by:   Corwin Levins MD   Signed by:   Corwin Levins MD on 05/08/2010   Method used:   Print then Give to Patient   RxID:   [id number] LEVOTHYROXINE SODIUM 150 MCG TABS (LEVOTHYROXINE SODIUM) 1po once daily  #90 x 3   Entered and Authorized by:   Corwin Levins MD   Signed by:   Corwin Levins MD on 05/08/2010   Method used:   Electronically to        [facility] (retail)       [address]       [place], [place]  [address]       Ph: [phone]       Fax: [phone]   RxID:   [id number]    Orders Added: 1)  Est. Patient 65& > [31517]

## 2010-08-16 NOTE — Progress Notes (Signed)
  Phone Note Other Incoming   Request: Send information Summary of Call: Request received from [facility] forwarded to [facility].

## 2010-08-16 NOTE — Progress Notes (Signed)
Summary: Prolia  Phone Note Outgoing Call   Call placed by: Lanier Prude, [facility]),  May 08, 2010 11:31 AM Summary of Call: faxed in verfi request for Prolia.Marland Kitchen...will wait on summary of benefits  Follow-up for Phone Call        left mess for pt to call back to inform her Prolia will cost 20% which is around $180-190. Follow-up by: Lanier Prude, [facility]),  May 14, 2010 9:46 AM  Additional Follow-up for Phone Call Additional follow up Details #1::        pt informed......she states she will wait for now and will discuss it furhter at next OV. Additional Follow-up by: Lanier Prude, [facility]),  May 14, 2010 12:07 PM    Additional Follow-up for Phone Call Additional follow up Details #2::    noted Follow-up by: Corwin Levins MD,  May 14, 2010 12:58 PM

## 2010-08-16 NOTE — Progress Notes (Signed)
Summary: Rx change  Phone Note Call from Patient Call back at Home Phone [phone]   Caller: Patient [phone] Summary of Call: Pt called stating that copay for Janumet 50/1000 is affordable, pt is req Rx to [facility]. Okay to change Rxs and send? Initial call taken by: Shaquitta Pyle, CMA,  May 09, 2010 1:58 PM  Follow-up for Phone Call        we'll abandon the janumet idea for now  ok to d/c actos; and start glimeparide 4 mg two times a day  - ro robin to handle Follow-up by: Corwin Levins MD,  May 09, 2010 8:28 PM    New/Updated Medications: GLIMEPIRIDE 4 MG TABS (GLIMEPIRIDE) 1 by mouth two times a day Prescriptions: GLIMEPIRIDE 4 MG TABS (GLIMEPIRIDE) 1 by mouth two times a day  #60 x 11   Entered by:   Zella Ball Ewing CMA (AAMA)   Authorized by:   Corwin Levins MD   Signed by:   Scharlene Gloss CMA (AAMA) on 05/10/2010   Method used:   Faxed to ...       [facility] DrMarland Kitchen (retail)       [address]       [place], [place]  [address]       Ph: [phone]       Fax: [phone]   RxID:   [id number]

## 2010-08-22 NOTE — Progress Notes (Signed)
Summary: Labs prior  Phone Note Call from Patient Call back at Home Phone [phone]   Caller: Patient Summary of Call: Pt called stating that she has appt 04/02 for surgical clearance. Pt wants to know if MD suggest labs prior to appt Initial call taken by: Meagen Pyle, CMA,  August 15, 2010 11:24 AM  Follow-up for Phone Call        ok - for BMP/hgba1c and lipids 250.02 Follow-up by: Corwin Levins MD,  August 15, 2010 12:03 PM  Additional Follow-up for Phone Call Additional follow up Details #1::        Labs scheduled 03/26 @ 8a, pt informed via home VM. Told to call back with any further questions or concerns Additional Follow-up by: Kyleeann Pyle, CMA,  August 15, 2010 3:55 PM

## 2010-08-30 NOTE — Letter (Signed)
Summary: Nilda Simmer MD  Nilda Simmer MD   Imported By: Lester [place] 08/21/2010 09:13:43  _____________________________________________________________________  External Attachment:    Type:   Image     Comment:   External Document

## 2010-09-13 ENCOUNTER — Other Ambulatory Visit: Payer: Self-pay | Admitting: Internal Medicine

## 2010-09-13 DIAGNOSIS — Z1231 Encounter for screening mammogram for malignant neoplasm of breast: Secondary | ICD-10-CM

## 2010-09-20 ENCOUNTER — Ambulatory Visit (HOSPITAL_COMMUNITY)
Admission: RE | Admit: 2010-09-20 | Discharge: 2010-09-20 | Disposition: A | Discharge status: Home / Self Care | Payer: Medicare Other | Source: Ambulatory Visit | Attending: Internal Medicine | Admitting: Internal Medicine

## 2010-09-20 DIAGNOSIS — Z1231 Encounter for screening mammogram for malignant neoplasm of breast: Secondary | ICD-10-CM | POA: Insufficient documentation

## 2010-10-05 ENCOUNTER — Other Ambulatory Visit: Payer: Self-pay | Admitting: *Deleted

## 2010-10-05 DIAGNOSIS — E785 Hyperlipidemia, unspecified: Secondary | ICD-10-CM

## 2010-10-05 DIAGNOSIS — IMO0001 Reserved for inherently not codable concepts without codable children: Secondary | ICD-10-CM

## 2010-10-08 ENCOUNTER — Other Ambulatory Visit (INDEPENDENT_AMBULATORY_CARE_PROVIDER_SITE_OTHER): Payer: Medicare Other

## 2010-10-08 DIAGNOSIS — IMO0001 Reserved for inherently not codable concepts without codable children: Secondary | ICD-10-CM

## 2010-10-08 DIAGNOSIS — E119 Type 2 diabetes mellitus without complications: Secondary | ICD-10-CM

## 2010-10-08 DIAGNOSIS — E785 Hyperlipidemia, unspecified: Secondary | ICD-10-CM

## 2010-10-08 LAB — BASIC METABOLIC PANEL
BUN: 23 mg/dL (ref 6–23)
CO2: 31 mEq/L (ref 19–32)
Calcium: 9.3 mg/dL (ref 8.4–10.5)
Chloride: 98 mEq/L (ref 96–112)
Creatinine, Ser: 1 mg/dL (ref 0.4–1.2)
Glucose, Bld: 148 mg/dL — ABNORMAL HIGH (ref 70–99)

## 2010-10-08 LAB — LIPID PANEL
LDL Cholesterol: 101 mg/dL — ABNORMAL HIGH (ref 0–99)
Total CHOL/HDL Ratio: 3
VLDL: 28 mg/dL (ref 0.0–40.0)

## 2010-10-15 ENCOUNTER — Encounter: Payer: Self-pay | Admitting: Internal Medicine

## 2010-10-15 ENCOUNTER — Ambulatory Visit (INDEPENDENT_AMBULATORY_CARE_PROVIDER_SITE_OTHER): Payer: Medicare Other | Admitting: Internal Medicine

## 2010-10-15 VITALS — BP 144/74 | HR 58 | Temp 98.0°F | Ht 67.0 in | Wt 286.2 lb

## 2010-10-15 DIAGNOSIS — I10 Essential (primary) hypertension: Secondary | ICD-10-CM

## 2010-10-15 DIAGNOSIS — E785 Hyperlipidemia, unspecified: Secondary | ICD-10-CM

## 2010-10-15 DIAGNOSIS — Z01818 Encounter for other preprocedural examination: Secondary | ICD-10-CM | POA: Insufficient documentation

## 2010-10-15 DIAGNOSIS — Z0000 Encounter for general adult medical examination without abnormal findings: Secondary | ICD-10-CM

## 2010-10-15 DIAGNOSIS — E119 Type 2 diabetes mellitus without complications: Secondary | ICD-10-CM

## 2010-10-15 DIAGNOSIS — Z0001 Encounter for general adult medical examination with abnormal findings: Secondary | ICD-10-CM | POA: Insufficient documentation

## 2010-10-15 MED ORDER — MELOXICAM 15 MG PO TABS: 15.0000 mg | ORAL_TABLET | Freq: Every day | ORAL | Status: DC

## 2010-10-15 MED ORDER — ATENOLOL-CHLORTHALIDONE 50-25 MG PO TABS: 1.0000 | ORAL_TABLET | Freq: Every day | ORAL | Status: DC

## 2010-10-15 MED ORDER — METFORMIN HCL 500 MG PO TABS: 500.0000 mg | ORAL_TABLET | Freq: Two times a day (BID) | ORAL | Status: DC

## 2010-10-15 MED ORDER — PIOGLITAZONE HCL 45 MG PO TABS: 45.0000 mg | ORAL_TABLET | Freq: Every day | ORAL | Status: DC

## 2010-10-15 MED ORDER — LEVOTHYROXINE SODIUM 150 MCG PO TABS: 150.0000 ug | ORAL_TABLET | Freq: Every day | ORAL | Status: DC

## 2010-10-15 MED ORDER — ALENDRONATE SODIUM 70 MG PO TABS: 70.0000 mg | ORAL_TABLET | ORAL | Status: DC

## 2010-10-15 MED ORDER — GLIMEPIRIDE 4 MG PO TABS: 4.0000 mg | ORAL_TABLET | Freq: Two times a day (BID) | ORAL | Status: DC

## 2010-10-15 NOTE — Assessment & Plan Note (Signed)
stable overall by hx and exam, most recent lab reviewed with pt, and pt to continue medical treatment as before, but with mult CRFs will need preop CV evaluation - for adenosine stress test. If neg will be OK for surgury

## 2010-10-15 NOTE — Assessment & Plan Note (Signed)
stable overall by hx and exam, most recent lab reviewed with pt, and pt to continue medical treatment as before   BP Readings from Last 3 Encounters:  10/15/10 144/74  05/08/10 110/70  10/31/09 122/62

## 2010-10-15 NOTE — Patient Instructions (Signed)
Continue all other medications as before You will be contacted regarding the referral for: stress test Please return in 6 mo with Lab testing done 3-5 days before

## 2010-10-15 NOTE — Assessment & Plan Note (Signed)
stable overall by hx and exam, most recent lab reviewed with pt, and pt to continue medical treatment as before   Lab Results  Component Value Date   HGBA1C 7.0* 10/08/2010

## 2010-10-15 NOTE — Assessment & Plan Note (Signed)
stable overall by hx and exam, most recent lab reviewed with pt, and pt to continue medical treatment as before   Lab Results  Component Value Date   LDLCALC 101* 10/08/2010

## 2010-10-15 NOTE — Progress Notes (Signed)
Subjective:    Patient ID: Kelsey Keller, female    DOB: [birth year], 70 y.o.   MRN: [id number]  HPI  Here to f/u;  Has been shceduled complete left TKA for apr 30.  Pt denies chest pain, increased sob or doe, wheezing, orthopnea, PND, increased LE swelling, palpitations, dizziness or syncope.  Pt denies new neurological symptoms such as new headache, or facial or extremity weakness or numbness   Pt denies polydipsia, polyuria, or low sugar symptoms such as weakness or confusion improved with po intake.  Pt states overall good compliance with meds, trying to follow lower cholesterol, diabetic diet, but little exercise however due to the knee , and has gained a few lbs back she had lost previously.  Denies worsening depressive symptoms except more "feeling sorry for myself lately", no suicidal ideation, or panic, though has ongoing anxiety, not increased recently. Overall good compliance with treatment, and good medicine tolerability.  Pt denies fever, wt loss, night sweats, loss of appetite, or other constitutional symptoms .   No recent falls, walks with cane to right hand., knee has some potential giveaway, and has to get up slowly to avoid falling.  Never had stress test in the past, no hx of heart.  dz. BP at home has been < 140/90  Past Medical History  Diagnosis Date  . HYPOTHYROIDISM 04/29/2007  . DIABETES MELLITUS, TYPE II 06/23/2007  . HYPERLIPIDEMIA 04/29/2007  . HYPOKALEMIA 05/02/2009  . HYPERTENSION 04/29/2007  . ALLERGIC RHINITIS 04/29/2007  . OSTEOARTHRITIS, KNEE, LEFT 06/23/2007  . SPINAL STENOSIS, LUMBAR 04/29/2007  . LOW BACK PAIN 04/29/2007  . TOE PAIN 02/20/2010  . OSTEOPOROSIS NOS 05/12/2007  . HIATAL HERNIA, HX OF 04/29/2007   Past Surgical History  Procedure Date  . Total knee arthroplasty 2006    Right  . Total knee arthroplasty 2006    right    reports that she has never smoked. She does not have any smokeless tobacco history on file. She reports that she drinks  alcohol. She reports that she does not use illicit drugs. family history includes Coronary artery disease in her other and Diabetes in her other. No Known Allergies  Current Outpatient Prescriptions on File Prior to Visit  Medication Sig Dispense Refill  . alendronate (FOSAMAX) 70 MG tablet Take 70 mg by mouth every 7 (seven) days. Take with a full glass of water on an empty stomach.       Marland Kitchen aspirin 81 MG EC tablet Take 81 mg by mouth daily.        Marland Kitchen atenolol-chlorthalidone (TENORETIC) 50-25 MG per tablet Take 1 tablet by mouth daily.        . calcium carbonate (TUMS EX) 750 MG chewable tablet Chew 1 tablet by mouth 2 (two) times daily.        Marland Kitchen glimepiride (AMARYL) 4 MG tablet Take 4 mg by mouth 2 (two) times daily.        Marland Kitchen glucose blood (ONE TOUCH ULTRA TEST) test strip 1 each. Use as directed two times a day       . Lancets MISC Use as directed two times a day       . levothyroxine (SYNTHROID, LEVOTHROID) 150 MCG tablet Take 150 mcg by mouth daily.        . meloxicam (MOBIC) 15 MG tablet Take 15 mg by mouth daily.        . metFORMIN (GLUCOPHAGE) 500 MG tablet Take 500 mg by mouth 2 (two) times daily.        Marland Kitchen  pioglitazone (ACTOS) 45 MG tablet Take 45 mg by mouth daily.         Review of Systems Review of Systems  Constitutional: Negative for diaphoresis, activity change, appetite change and unexpected weight change.  HENT: Negative for hearing loss, ear pain, facial swelling, mouth sores and neck stiffness.   Eyes: Negative for pain, redness and visual disturbance.  Respiratory: Negative for shortness of breath and wheezing.   Cardiovascular: Negative for chest pain and palpitations.  Gastrointestinal: Negative for diarrhea, blood in stool, abdominal distention and rectal pain.  Genitourinary: Negative for hematuria, flank pain and decreased urine volume.  Musculoskeletal: Negative for myalgias and joint swelling.  Skin: Negative for color change and wound.  Neurological: Negative  for syncope and numbness.  Hematological: Negative for adenopathy.  Psychiatric/Behavioral: Negative for hallucinations, self-injury, decreased concentration and agitation.     Objective:   Physical Exam BP 144/74  Pulse 58  Temp(Src) 98 F (36.7 C) (Oral)  Ht 5\' 7"  (1.702 m)  Wt 286 lb 4 oz (129.842 kg)  BMI 44.83 kg/m2  SpO2 96% Physical Exam  VS noted Constitutional: Pt is oriented to person, place, and time. Appears well-developed and well-nourished.  HENT:  Head: Normocephalic and atraumatic.  Right Ear: External ear normal.  Left Ear: External ear normal.  Nose: Nose normal.  Mouth/Throat: Oropharynx is clear and moist.  Eyes: Conjunctivae and EOM are normal. Pupils are equal, round, and reactive to light.  Neck: Normal range of motion. Neck supple. No JVD present. No tracheal deviation present.  Cardiovascular: Normal rate, regular rhythm, normal heart sounds and intact distal pulses.   Pulmonary/Chest: Effort normal and breath sounds normal.  Abdominal: Soft. Bowel sounds are normal. There is no tenderness.  Musculoskeletal: Normal range of motion. Exhibits no edema.  Lymphadenopathy:  Has no cervical adenopathy.  Neurological: Pt is alert and oriented to person, place, and time. Pt has normal reflexes. No cranial nerve deficit.  Skin: Skin is warm and dry. No rash noted.  Psychiatric:  Has  normal mood and affect. Behavior is normal.  Right knee normal ROM, NT, no effusion Left knee with mild tenderness, 1+ effusion and decreased ROM with crepitus       Assessment & Plan:

## 2010-10-24 ENCOUNTER — Telehealth: Payer: Self-pay

## 2010-10-24 DIAGNOSIS — Z01810 Encounter for preprocedural cardiovascular examination: Secondary | ICD-10-CM

## 2010-10-24 NOTE — Telephone Encounter (Addendum)
Pt called to check the status of stress test she was advised to have at last OV. I see were MD states she will be contacted but referral for test but referral was not placed, please order test.

## 2010-10-24 NOTE — Telephone Encounter (Signed)
Order forwarded to [facility] Debra for scheduling

## 2010-10-24 NOTE — Telephone Encounter (Signed)
Test ordered

## 2010-10-29 ENCOUNTER — Ambulatory Visit (HOSPITAL_COMMUNITY): Payer: Medicare Other | Attending: Internal Medicine | Admitting: Radiology

## 2010-10-29 ENCOUNTER — Encounter (HOSPITAL_COMMUNITY): Payer: Medicare Other | Admitting: Radiology

## 2010-10-29 DIAGNOSIS — R0989 Other specified symptoms and signs involving the circulatory and respiratory systems: Secondary | ICD-10-CM

## 2010-10-29 DIAGNOSIS — R0602 Shortness of breath: Secondary | ICD-10-CM

## 2010-10-29 DIAGNOSIS — E119 Type 2 diabetes mellitus without complications: Secondary | ICD-10-CM

## 2010-10-29 DIAGNOSIS — R0609 Other forms of dyspnea: Secondary | ICD-10-CM

## 2010-10-29 DIAGNOSIS — Z01810 Encounter for preprocedural cardiovascular examination: Secondary | ICD-10-CM | POA: Insufficient documentation

## 2010-10-29 MED ORDER — REGADENOSON 0.4 MG/5ML IV SOLN
0.4000 mg | Freq: Once | INTRAVENOUS | Status: AC
  Administered 2010-10-29: 0.4 mg via INTRAVENOUS

## 2010-10-29 MED ORDER — TECHNETIUM TC 99M TETROFOSMIN IV KIT
11.0000 | PACK | Freq: Once | INTRAVENOUS | Status: AC | PRN
  Administered 2010-10-29: 11 via INTRAVENOUS

## 2010-10-29 MED ORDER — TECHNETIUM TC 99M TETROFOSMIN IV KIT
33.0000 | PACK | Freq: Once | INTRAVENOUS | Status: AC | PRN
  Administered 2010-10-29: 33 via INTRAVENOUS

## 2010-10-29 NOTE — Progress Notes (Signed)
[facility] SITE 3 NUCLEAR MED [address] [place] [address] [phone]  Cardiology Nuclear Med Study  Kelsey Keller is a 71 y.o. female [id number] June 21, 1941   Nuclear Med Background Indication for Stress Test:  Evaluation for Ischemia and Surgical Clearance (L) TKR on 11/12/10 by Dr.Robert Thurston Hole History:  No previous documented CAD Cardiac Risk Factors: Family History - CAD, Hypertension, Lipids and NIDDM  Symptoms:  DOE   Nuclear Pre-Procedure Caffeine/Decaff Intake:  None NPO After: 7:00am   Lungs:  clear IV 0.9% NS with Angio Cath:  22g  IV Site: R Hand  IV Started by:  Irean Hong, RN  Chest Size (in):  46 Cup Size: C  Height: 5\' 7"  (1.702 m)  Weight:  284 lb (128.822 kg)  BMI:  Body mass index is 44.48 kg/(m^2). Tech Comments:  Atenolol/chlor held x 24 hrs.    Nuclear Med Study 1 or 2 day study: 1 day  Stress Test Type:  Eugenie Birks  Reading MD: Willa Rough, MD  Order Authorizing Provider:  J.John  Resting Radionuclide: Technetium 69m Tetrofosmin  Resting Radionuclide Dose: 11 mCi   Stress Radionuclide:  Technetium 79m Tetrofosmin  Stress Radionuclide Dose: 33 mCi           Stress Protocol Rest HR: 67 Stress HR: 81  Rest BP: 121/75 Stress BP: 148/46  Exercise Time (min): n/a METS: n/a   Predicted Max HR: 152 bpm % Max HR: 53.29 bpm Rate Pressure Product: 21308   Dose of Adenosine (mg):  n/a Dose of Lexiscan: 0.4 mg  Dose of Atropine (mg): n/a Dose of Dobutamine: n/a mcg/kg/min (at max HR)  Stress Test Technologist: Milana Na, EMT-P  Nuclear Technologist:  Domenic Polite, CNMT     Rest Procedure:  Myocardial perfusion imaging was performed at rest 45 minutes following the intravenous administration of Technetium 51m Tetrofosmin. Rest ECG: SR with PACS  Stress Procedure:  The patient received IV Lexiscan 0.4 mg over 15-seconds.  Technetium 69m Tetrofosmin injected at 30-seconds.  There were no significant changes and occ  pacs with Lexiscan.  Quantitative spect images were obtained after a 45 minute delay. Stress ECG: No significant change from baseline ECG  QPS Raw Data Images:  Normal; no motion artifact; normal heart/lung ratio. Stress Images:  Normal homogeneous uptake in all areas of the myocardium. Rest Images:  Normal homogeneous uptake in all areas of the myocardium. Subtraction (SDS):  No evidence of ischemia. Transient Ischemic Dilatation (Normal <1.22):  0.98 Lung/Heart Ratio (Normal <0.45):  0.34  Quantitative Gated Spect Images QGS EDV:  n/a QGS ESV:  n/a QGS cine images:  study not gated QGS EF: Study not gated  Impression Exercise Capacity:  Lexiscan with no exercise. BP Response:  Normal blood pressure response. Clinical Symptoms:  Dizzy ECG Impression:  No significant ST segment change suggestive of ischemia. Comparison with Prior Nuclear Study: No images to compare  Overall Impression:  Normal stress nuclear study. Wall motion could not be assessed.    Willa Rough

## 2010-10-30 ENCOUNTER — Telehealth: Payer: Self-pay | Admitting: Internal Medicine

## 2010-10-30 NOTE — Progress Notes (Signed)
COPY ROUTED TO DR . Jonny Ruiz.Mirna Mires

## 2010-10-30 NOTE — Telephone Encounter (Signed)
Stress test negative - robin to notify pt  Robin to prepare note to Dr Thurston Hole: - [facility]  OK for surgury

## 2010-10-31 NOTE — Telephone Encounter (Signed)
Completed letter and faxed along with stress test to [facility] attn. Dr. Thurston Hole to [phone].

## 2010-11-01 ENCOUNTER — Other Ambulatory Visit: Payer: Self-pay | Admitting: Internal Medicine

## 2010-11-01 ENCOUNTER — Other Ambulatory Visit: Payer: Self-pay

## 2010-11-01 DIAGNOSIS — IMO0001 Reserved for inherently not codable concepts without codable children: Secondary | ICD-10-CM

## 2010-11-06 ENCOUNTER — Encounter (HOSPITAL_COMMUNITY)
Admission: RE | Admit: 2010-11-06 | Discharge: 2010-11-06 | Disposition: A | Discharge status: Home / Self Care | Payer: Medicare Other | Source: Ambulatory Visit | Attending: Orthopedic Surgery | Admitting: Orthopedic Surgery

## 2010-11-06 ENCOUNTER — Ambulatory Visit (HOSPITAL_COMMUNITY)
Admission: RE | Admit: 2010-11-06 | Discharge: 2010-11-06 | Disposition: A | Discharge status: Home / Self Care | Payer: Medicare Other | Source: Ambulatory Visit | Attending: Orthopedic Surgery | Admitting: Orthopedic Surgery

## 2010-11-06 ENCOUNTER — Other Ambulatory Visit (HOSPITAL_COMMUNITY): Payer: Self-pay | Admitting: Orthopedic Surgery

## 2010-11-06 DIAGNOSIS — Z01810 Encounter for preprocedural cardiovascular examination: Secondary | ICD-10-CM | POA: Insufficient documentation

## 2010-11-06 DIAGNOSIS — Z01818 Encounter for other preprocedural examination: Secondary | ICD-10-CM | POA: Insufficient documentation

## 2010-11-06 DIAGNOSIS — M1712 Unilateral primary osteoarthritis, left knee: Secondary | ICD-10-CM

## 2010-11-06 DIAGNOSIS — Z01812 Encounter for preprocedural laboratory examination: Secondary | ICD-10-CM | POA: Insufficient documentation

## 2010-11-06 DIAGNOSIS — M1710 Unilateral primary osteoarthritis, unspecified knee: Secondary | ICD-10-CM | POA: Insufficient documentation

## 2010-11-06 DIAGNOSIS — IMO0002 Reserved for concepts with insufficient information to code with codable children: Secondary | ICD-10-CM | POA: Insufficient documentation

## 2010-11-06 DIAGNOSIS — I517 Cardiomegaly: Secondary | ICD-10-CM | POA: Insufficient documentation

## 2010-11-06 LAB — CBC
HCT: 42.6 % (ref 36.0–46.0)
Hemoglobin: 14.7 g/dL (ref 12.0–15.0)
MCHC: 34.5 g/dL (ref 30.0–36.0)
RBC: 4.56 MIL/uL (ref 3.87–5.11)
WBC: 6.4 10*3/uL (ref 4.0–10.5)

## 2010-11-06 LAB — COMPREHENSIVE METABOLIC PANEL
ALT: 44 U/L — ABNORMAL HIGH (ref 0–35)
AST: 47 U/L — ABNORMAL HIGH (ref 0–37)
Calcium: 10.2 mg/dL (ref 8.4–10.5)
Creatinine, Ser: 0.99 mg/dL (ref 0.4–1.2)
GFR calc Af Amer: >60 mL/min (ref >60)
GFR calc non Af Amer: 56 mL/min — ABNORMAL LOW (ref >60)
Sodium: 136 mEq/L (ref 135–145)
Total Protein: 6.2 g/dL (ref 6.0–8.3)

## 2010-11-06 LAB — DIFFERENTIAL
Basophils Absolute: 0 10*3/uL (ref 0.0–0.1)
Basophils Relative: 0 % (ref 0–1)
Eosinophils Relative: 5 % (ref 0–5)
Monocytes Absolute: 0.5 10*3/uL (ref 0.1–1.0)

## 2010-11-06 LAB — PROTIME-INR
INR: 0.9 (ref 0.00–1.49)
Prothrombin Time: 12.4 seconds (ref 11.6–15.2)

## 2010-11-06 LAB — URINALYSIS, ROUTINE W REFLEX MICROSCOPIC
Glucose, UA: NEGATIVE mg/dL
Hgb urine dipstick: NEGATIVE
Specific Gravity, Urine: 1.009 (ref 1.005–1.030)
pH: 7.5 (ref 5.0–8.0)

## 2010-11-06 LAB — SURGICAL PCR SCREEN: Staphylococcus aureus: NEGATIVE

## 2010-11-07 LAB — URINE CULTURE
Culture  Setup Time: 201204241859
Culture: NO GROWTH

## 2010-11-08 ENCOUNTER — Ambulatory Visit: Payer: Self-pay | Admitting: Internal Medicine

## 2010-11-12 ENCOUNTER — Inpatient Hospital Stay (HOSPITAL_COMMUNITY)
Admission: RE | Admit: 2010-11-12 | Discharge: 2010-11-14 | DRG: 470 | Disposition: A | Discharge status: Home / Self Care | Payer: Medicare Other | Source: Ambulatory Visit | Attending: Orthopedic Surgery | Admitting: Orthopedic Surgery

## 2010-11-12 DIAGNOSIS — D62 Acute posthemorrhagic anemia: Secondary | ICD-10-CM | POA: Diagnosis not present

## 2010-11-12 DIAGNOSIS — M1710 Unilateral primary osteoarthritis, unspecified knee: Principal | ICD-10-CM | POA: Diagnosis present

## 2010-11-12 DIAGNOSIS — I10 Essential (primary) hypertension: Secondary | ICD-10-CM | POA: Diagnosis present

## 2010-11-12 DIAGNOSIS — K5900 Constipation, unspecified: Secondary | ICD-10-CM | POA: Diagnosis present

## 2010-11-12 DIAGNOSIS — E039 Hypothyroidism, unspecified: Secondary | ICD-10-CM | POA: Diagnosis present

## 2010-11-12 DIAGNOSIS — E119 Type 2 diabetes mellitus without complications: Secondary | ICD-10-CM | POA: Diagnosis present

## 2010-11-12 LAB — GLUCOSE, CAPILLARY
Glucose-Capillary: 144 mg/dL — ABNORMAL HIGH (ref 70–99)
Glucose-Capillary: 254 mg/dL — ABNORMAL HIGH (ref 70–99)
Glucose-Capillary: 147 mg/dL — ABNORMAL HIGH (ref 70–99)

## 2010-11-12 LAB — ABO/RH: ABO/RH(D): O POS

## 2010-11-12 LAB — TYPE AND SCREEN

## 2010-11-13 LAB — GLUCOSE, CAPILLARY
Glucose-Capillary: 219 mg/dL — ABNORMAL HIGH (ref 70–99)
Glucose-Capillary: 131 mg/dL — ABNORMAL HIGH (ref 70–99)
Glucose-Capillary: 178 mg/dL — ABNORMAL HIGH (ref 70–99)

## 2010-11-13 LAB — BASIC METABOLIC PANEL
Calcium: 9 mg/dL (ref 8.4–10.5)
GFR calc Af Amer: >60 mL/min (ref >60)
GFR calc non Af Amer: 54 mL/min — ABNORMAL LOW (ref >60)
Glucose, Bld: 161 mg/dL — ABNORMAL HIGH (ref 70–99)
Potassium: 4.2 mEq/L (ref 3.5–5.1)
Sodium: 133 mEq/L — ABNORMAL LOW (ref 135–145)

## 2010-11-13 LAB — CBC
HCT: 33.9 % — ABNORMAL LOW (ref 36.0–46.0)
Hemoglobin: 11.5 g/dL — ABNORMAL LOW (ref 12.0–15.0)
RBC: 3.65 MIL/uL — ABNORMAL LOW (ref 3.87–5.11)
WBC: 8 10*3/uL (ref 4.0–10.5)

## 2010-11-13 LAB — HEMOGLOBIN A1C
Hgb A1c MFr Bld: 6.9 % — ABNORMAL HIGH (ref <5.7)
Mean Plasma Glucose: 151 mg/dL — ABNORMAL HIGH (ref <117)

## 2010-11-14 LAB — CBC
HCT: 29.8 % — ABNORMAL LOW (ref 36.0–46.0)
Hemoglobin: 10.1 g/dL — ABNORMAL LOW (ref 12.0–15.0)
MCH: 31.6 pg (ref 26.0–34.0)
MCHC: 33.9 g/dL (ref 30.0–36.0)
MCV: 93.1 fL (ref 78.0–100.0)
RBC: 3.2 MIL/uL — ABNORMAL LOW (ref 3.87–5.11)

## 2010-11-14 LAB — GLUCOSE, CAPILLARY: Glucose-Capillary: 189 mg/dL — ABNORMAL HIGH (ref 70–99)

## 2010-11-14 LAB — BASIC METABOLIC PANEL
CO2: 32 mEq/L (ref 19–32)
Chloride: 92 mEq/L — ABNORMAL LOW (ref 96–112)
Creatinine, Ser: 0.97 mg/dL (ref 0.4–1.2)
GFR calc Af Amer: >60 mL/min (ref >60)
Glucose, Bld: 186 mg/dL — ABNORMAL HIGH (ref 70–99)

## 2010-11-30 NOTE — Op Note (Signed)
Kelsey Keller, Kelsey Keller              ACCOUNT NO.:  [id number]   MEDICAL RECORD NO.:  [id number]          PATIENT TYPE:  INP   LOCATION:  4705                         FACILITY:  [facility]   PHYSICIAN:  Robert A. Thurston Hole, M.D. DATE OF BIRTH:  24-Sep-1940   DATE OF PROCEDURE:  05/09/2004  DATE OF DISCHARGE:                                 OPERATIVE REPORT   PREOPERATIVE DIAGNOSIS:  Right knee degenerative joint disease.   POSTOPERATIVE DIAGNOSIS:  Right knee degenerative joint disease.   PROCEDURE:  Right total knee replacement using [employer]ics Scorpio total knee  system with #11 cemented femur, #9 cemented tibia, with 15 mm polyethylene  flexed tibial spacer and 28 mm polyethylene cemented patella.   SURGEON:  Elana Alm. Thurston Hole, MD.   ASSISTANTJulien Girt, PA.   ANESTHESIA:  General.   OPERATIVE TIME:  1 hour and 30 minutes.   COMPLICATIONS:  None.   DESCRIPTION OF PROCEDURE:  Ms. Wahlberg is brought to the operating room on  May 09, 2004, after a femoral nerve block had been placed by Anesthesia  in the holding room.  She was placed on the operative table in the supine  position.  After an adequate level of general anesthesia was obtained, her  right knee was examined.  Range of motion from minus 5 to 125 degrees,  moderate varus deformity, knee stable ligamentous exam with normal patella  tracking.  She had a Foley catheter placed under sterile conditions and  received Ancef 1 gm IV preoperatively for prophylaxis.  Her right leg was  prepped using sterile Duraprep and draped using a sterile technique.  The  leg was exsanguinated and a thigh tourniquet elevated to 375 mm.  Initially  through a 15 cm longitudinal incision based over the patella, initial  exposure was made.  The underlying subcutaneous tissues were incised in line  with the skin incision.  A median arthrotomy was performed revealing an  excessive amount of normal-appearing joint fluid.  The articular  surfaces  were inspected.  She had grade 4 changes medially, grade 3 changes  laterally, and grade 3 and 4 changes in the patellofemoral joint.  The  medial and lateral meniscal remnants were removed as well as the anterior  cruciate ligament.  Osteophytes were removed off the femoral condyles and  tibial plateau.  The intramedullary drill was then drilled up the femoral  canal for placement of the distal femoral cutting jig, which was placed in  the appropriate amount of rotation, and the distal 12-mm cut was made.  The  distal femur was incised.  A #11 was found to be the appropriate size.  A  #11 cutting jig was placed and then these cuts were made.  After this was  done, the proximal tibia was then exposed.  Tibial __________  were removed  with an oscillating saw.  Intramedullary drill drilled down the tibial canal  for placement of the proximal tibial cutting jig, which was placed in the  appropriate amount of rotation, and a proximal 6-mm cut was made.  A Scorpio  PCL cutter was then placed back  on the distal femur, and these cuts were  made.  After this was done, a #11 femoral trial was placed.  The #9 tibial  base plate trial was placed, and with a 15 mm polyethylene flexed tibial  spacer there was found to be excellent restoration with normal alignment,  excellent stability, range of motion from 0 to 125 degrees with no lift-off  on the tray.  The tibial tray was then marked for rotation, and the Keel cut  was made.  After this was done, the patella was sized.  A 28 mm was found to  be the appropriate size, and a recessed 10 mm x 28-mm cut was made and 3  locking holes were placed.  At this point, it was felt that all the trial  components were of excellent size, fit, and stability.  They were then  removed, and the knee was then jet-lavaged irrigated with 3 liters of saline  solution.  The proximal tibia was then exposed.  Then, a #9 tibial base  plate with cement backing was  hammered into position with an excellent fit,  with excess cement being removed from around the edges.  The #11 femoral  component with cement backing was hammered into position also with an  excellent fit, with excess cement being removed from around the edges.  The  15 mm polyethylene flexed tibial spacer was then locked on the tibial base  plate.  Knee taken through a range of motion from 0 to 120 degrees with  excellent stability and excellent correction of her varus deformity.  The 28  mm polyethylene cement backed patella was then locked into its recess hole  and held there with a clamp.  After the cement hardened, patellofemoral  tracking was evaluated and this was found to be normal.  At this point, it  was felt that all of the components were of excellent size, fit, and  stability.  The knee was further irrigated with saline. and then the  arthrotomy was closed with #1 Ethibond suture over 2 medium Hemovac drains.  The subcutaneous tissues were closed with 0 and 2-0 Vicryl, the skin closed  with skin staples, sterile dressings were applied, the Hemovac injected with  0.25% Marcaine with epinephrine, and clamped.  Tourniquet was released, a  long leg splint applied after sterile dressings were applied, and then the  patient awakened, extubated, and taken to the recovery room in stable  condition.  Needle and sponge counts were correct x2 at the end of the case.       RAW/MEDQ  D:  05/09/2004  T:  05/09/2004  Job:  [id number]

## 2010-11-30 NOTE — Consult Note (Signed)
NAMELESLEIGH, HUGHSON              ACCOUNT NO.:  [id number]   MEDICAL RECORD NO.:  [id number]          PATIENT TYPE:  INP   LOCATION:  4705                         FACILITY:  [facility]   PHYSICIAN:  Hollice Espy, M.D.DATE OF BIRTH:  1940-09-12   DATE OF CONSULTATION:  05/09/2004  DATE OF DISCHARGE:                                   CONSULTATION   REQUESTING PHYSICIAN:  Elana Alm. Thurston Hole, M.D.   REASON FOR CONSULTATION:  Medical management of hypertension and  hypothyroidism.   The patient is a 70 year old white female with past medical history of  arthritis, hypertension, and hypothyroidism who underwent a right total knee  replacement for degenerative joint disease.  Following the procedure, she  was transferred to the medical floor where she is stable.  Medical consult  was requested for management of her medical issues.  Specifically, the  patient has history of hypertension which she tells me is relatively well  controlled as well as arthritis which she is on anti-inflammatories as well  as Chondroitin and Glucosamine for.  In addition, the patient reportedly has  a history of diabetes according to the requesting physician.  Blood sugars  were reportedly in the 200s.  However, I am unable to see any vital sign  reports of this, and the patient tells me she has no history of diabetes.  She apparently tells me she is feeling quite tired and her right leg is  sore, but she otherwise has no complaints.  She is currently in a cast and  immobilization with the leg elevated.  She denies any other complaints such  as headaches, visual changes, dysphagia, chest pain, palpitations, shortness  of breath, wheeze, cough, abdominal pain, hematuria, dysuria, constipation  or diarrhea.   The patient's past medical history is for hypertension, hypothyroidism,  arthritis.  She has no known drug allergies.   SOCIAL HISTORY:  She denies any tobacco, alcohol or drug use.   MEDICATIONS:  1.   Evista 60 mg p.o. daily.  2.  Synthroid 200 mcg p.o. daily.  3.  Bisoprolol/Hydrochlorothiazide 5/6.25 daily.  4.  Chondroitin, Glucosamine.  5.  Tylenol 800 mg three times daily.  6.  Multivitamin.   Her family history is noncontributory.   PHYSICAL EXAMINATION:  VITAL SIGNS:  Vitals postop:  Blood pressure 130/79,  respirations 20, pulse 62, temperature 96.4, O2 saturations 100% on two  liters.  GENERAL:  She is alert and oriented x3 in no apparent distress.  HEENT:  Normocephalic, atraumatic.  Mucous membranes are moist.  She has no  carotid bruits.  HEART:  Regular rate and rhythm. S1, S2.  LUNGS:  Clear to auscultation bilaterally.  ABDOMEN:  Soft, nontender, nondistended.  Hypoactive bowel sounds.  EXTREMITIES:  Her right lower extremity is status post right total knee and  is casted and elevated.  Her left leg shows trace pitting edema, at best  with good 2+ pulses.   LABORATORY DATA:  Preop labs done from October 20 show normal urinalysis  with no evidence of any infection. PT, PTT, and INR are normal.  The patient  has a  normal white count of 5.4 with H&H of 14.1 and 41, MCV 91, platelet  count 244, sodium 137, potassium 4, chloride 104, bicarb 27, BUN 15,  creatinine 0.8, glucose 212, calcium 9.4.   ASSESSMENT/PLAN:  1.  Hypertension.  Her blood pressure appears stable.  I would just continue      her bisoprolol and Hydrochlorothiazide at this time although with pain      the patient can become slightly dehydrated easily and would watch an      occasional BMET to ensure that she does not become dry, and if so,      Hydrochlorothiazide may need to be held.  2.  Hypothyroidism.  Continue her Synthroid.  3.  Noted blood sugar of 212.  This is not a fasting blood sugar.  It is a      not a fully appropriate screening for diabetes.  I would order a fasting      blood sugar for the morning, and if this is normal then would assume      that she does not have any history of  diabetes.  4.  History of arthritis and possible osteoporosis.  Continue her Evista.      She may resume glucosamine and chondroitin as an outpatient.      Send   SKK/MEDQ  D:  05/09/2004  T:  05/09/2004  Job:  [id number]

## 2010-12-04 NOTE — Op Note (Signed)
NAMEANNELIES, COYT              ACCOUNT NO.:  [id number]  MEDICAL RECORD NO.:  [id number]           PATIENT TYPE:  I  LOCATION:  5021                         FACILITY:  [facility]  PHYSICIAN:  Euretha Najarro A. Thurston Hole, M.D. DATE OF BIRTH:  [birth year]  DATE OF PROCEDURE:  11/12/2010 DATE OF DISCHARGE:                              OPERATIVE REPORT   PREOPERATIVE DIAGNOSIS:  Left knee degenerative joint disease.  POSTOPERATIVE DIAGNOSIS:  Left knee degenerative joint disease.  PROCEDURES: 1. Left total knee replacement using DePuy cemented total knee system     with #5 cemented femur, #4 cemented tibia with 12.5-mm polyethylene     RP tibial spacer, and 38-mm polyethylene cemented patella. 2. Left knee lateral retinacular release. 3. Left knee Zinacef-impregnated cement.  SURGEON:  Elana Alm. Thurston Hole, MD  ASSISTANT:  Julien Girt, PA-C  ANESTHESIA:  General.  OPERATIVE TIME:  One hour and forty minutes.  COMPLICATIONS:  None.  DESCRIPTION OF THE PROCEDURE:  Ms. Dearman was brought to the operating room on November 12, 2010, after a femoral nerve block was placed in holding area by Anesthesia.  She was placed on the operating table in the supine position.  She received Ancef 2 g IV preoperatively for prophylaxis.  After being placed under general anesthesia, she had a Foley catheter placed under sterile conditions.  Her left knee was examined.  Range of motion from -8 to 125 degrees, moderate valgus deformity of knee, stable ligamentous exam with moderate lateral patellar tracking.  Left leg was prepped using sterile DuraPrep and draped using sterile technique.  Time-out procedure was called and the correct left knee was identified.  The left leg was exsanguinated and a thigh tourniquet was elevated at 375 mmHg.  Initially, through a 15-cm longitudinal incision based over the patella, initial exposure was made. The underlying subcutaneous tissues were incised along with  skin incision.  A median arthrotomy was performed revealing an excessive amount of normal-appearing joint fluid.  The articular surfaces were inspected.  She had grade 4 changes medially, laterally, and in the patellofemoral joint.  Osteophytes were removed from the femoral condyles and tibial plateau.  The medial and lateral meniscal remnants were removed as well as the anterior cruciate ligament.  Intramedullary drill was then drilled up the femoral canal for placement of distal femoral cutting guide which was placed in the appropriate manner rotation and a distal 11-mm cut was made.  The distal femur was incised. A #5 was found be the appropriate size.  A #5 cutting jig was placed in the appropriate manner of external rotation and then these cuts were made.  At this point, the proximal tibia was exposed.  The tibial spines were removed with an oscillating saw.  Intramedullary drill was drilled down the tibial canal for placement of proximal tibial cutting jig which was placed in the appropriate manner rotation and a proximal 2-mm cut was made based off the lateral or lower side.  Spacer blocks were then placed in flexion and extension.  A 12.5-mm epoxy gave excellent balancing, excellent stability, and excellent correction of her flexion and valgus deformities.  At this point,  the #4 tibial baseplate trial was placed on the cut tibial surface with an excellent fit and the keel cut was made.  The PCL box cutter was then placed on the distal femur and these cuts were made.  At this point, a #5 femoral trial was placed and with a #4 tibial baseplate trial and a 12.5-mm polyethylene RP tibial spacer, knee was reduced, taken through range of motion from 0 to 125 degrees with excellent stability and excellent correction of her flexion and valgus deformities.  There was still moderate lateral patellar tracking, thus a lateral retinacular release was carried out significantly improving  patellar tracking.  At this point, a resurfacing 9-mm cut was then made on the patella and three locking holes were placed for a 38-mm polyethylene patellar trial, and at this point, patellofemoral tracking was reevaluated and this was found to be normal. At this point, it was felt that all trial components were of excellent size, fit, and stability.  They were then removed.  The knee was then jet lavage irrigated with 3 liters of saline.  Proximal tibia was then exposed and a #4 tibial baseplate with Zinacef-impregnated cement was hammered into position with an excellent fit with excess cement being removed from around the edges.  A #5 femoral component with cement backing was hammered into position, also with an excellent fit with excess cement being removed around the edges.  The 12.5-mm polyethylene RP tibial spacer was placed on tibial baseplate, the knee reduced, taken through the range of motion from 0 to 125 degrees with excellent stability and excellent correction of her flexion and valgus deformities.  The 38-mm polyethylene cement backed patella was then placed in its position and held there with a clamp.  After the cement hardened, again patellofemoral tracking was evaluated and found to be normal.  At this point, it was felt that all the components were of excellent size, fit, and stability.  The wound was further irrigated with saline.  The tourniquet was released.  Hemostasis was obtained with cautery.  The arthrotomy was then closed with #1 Ethibond suture over two medium Hemovac drains.  Subcutaneous tissues were closed 0 and 2-0 Vicryl, subcuticular layer was closed with 4-0 Monocryl.  Sterile dressings and a long-leg splint applied.  The patient was awakened, extubated, and taken to recovery room in stable condition.  Needle, sponge counts were correct x2 at the end of the case.  Neurovascular status normal.  Pulses 2+ and symmetric.     Kimara Bencomo A. Thurston Hole,  M.D.     RAW/MEDQ  D:  11/12/2010  T:  11/13/2010  Job:  [id number]  Electronically Signed by Salvatore Marvel M.D. on 12/04/2010 05:22:44 PM

## 2010-12-25 ENCOUNTER — Encounter: Payer: Self-pay | Admitting: Internal Medicine

## 2011-03-01 ENCOUNTER — Other Ambulatory Visit: Payer: Self-pay | Admitting: Internal Medicine

## 2011-04-16 ENCOUNTER — Ambulatory Visit: Payer: Medicare Other | Admitting: Internal Medicine

## 2011-05-16 HISTORY — PX: CATARACT EXTRACTION W/ INTRAOCULAR LENS  IMPLANT, BILATERAL: SHX1307

## 2011-09-05 ENCOUNTER — Other Ambulatory Visit: Payer: Self-pay | Admitting: Internal Medicine

## 2011-09-05 DIAGNOSIS — Z1231 Encounter for screening mammogram for malignant neoplasm of breast: Secondary | ICD-10-CM

## 2011-09-10 ENCOUNTER — Other Ambulatory Visit (INDEPENDENT_AMBULATORY_CARE_PROVIDER_SITE_OTHER): Payer: Medicare Other

## 2011-09-10 ENCOUNTER — Telehealth: Payer: Self-pay | Admitting: Internal Medicine

## 2011-09-10 DIAGNOSIS — E785 Hyperlipidemia, unspecified: Secondary | ICD-10-CM

## 2011-09-10 DIAGNOSIS — IMO0001 Reserved for inherently not codable concepts without codable children: Secondary | ICD-10-CM

## 2011-09-10 DIAGNOSIS — Z0000 Encounter for general adult medical examination without abnormal findings: Secondary | ICD-10-CM

## 2011-09-10 LAB — BASIC METABOLIC PANEL
BUN: 22 mg/dL (ref 6–23)
CO2: 28 mEq/L (ref 19–32)
Calcium: 9.7 mg/dL (ref 8.4–10.5)
Chloride: 99 mEq/L (ref 96–112)
Creatinine, Ser: 0.9 mg/dL (ref 0.4–1.2)

## 2011-09-10 LAB — URINALYSIS, ROUTINE W REFLEX MICROSCOPIC
Hgb urine dipstick: NEGATIVE
Nitrite: NEGATIVE
Specific Gravity, Urine: 1.02 (ref 1.000–1.030)
Total Protein, Urine: NEGATIVE
Urine Glucose: NEGATIVE

## 2011-09-10 LAB — CBC WITH DIFFERENTIAL/PLATELET
Basophils Relative: 0.6 % (ref 0.0–3.0)
Eosinophils Absolute: 0.2 10*3/uL (ref 0.0–0.7)
Eosinophils Relative: 3.7 % (ref 0.0–5.0)
Hemoglobin: 14.2 g/dL (ref 12.0–15.0)
Lymphocytes Relative: 21.6 % (ref 12.0–46.0)
Monocytes Relative: 10.8 % (ref 3.0–12.0)
Neutro Abs: 2.6 10*3/uL (ref 1.4–7.7)
Neutrophils Relative %: 63.3 % (ref 43.0–77.0)
RBC: 4.47 Mil/uL (ref 3.87–5.11)
WBC: 4.1 10*3/uL — ABNORMAL LOW (ref 4.5–10.5)

## 2011-09-10 LAB — HEPATIC FUNCTION PANEL
ALT: 54 U/L — ABNORMAL HIGH (ref 0–35)
Albumin: 4 g/dL (ref 3.5–5.2)
Bilirubin, Direct: 0.2 mg/dL (ref 0.0–0.3)
Total Protein: 6.9 g/dL (ref 6.0–8.3)

## 2011-09-10 LAB — MICROALBUMIN / CREATININE URINE RATIO: Microalb, Ur: 1.5 mg/dL (ref 0.0–1.9)

## 2011-09-10 LAB — LIPID PANEL
Cholesterol: 171 mg/dL (ref 0–200)
HDL: 59.9 mg/dL (ref >39.00)
Total CHOL/HDL Ratio: 3
Triglycerides: 140 mg/dL (ref 0.0–149.0)

## 2011-09-10 NOTE — Telephone Encounter (Signed)
Message copied by Corwin Levins on Tue Sep 10, 2011 11:42 AM ------      Message from: Pincus Sanes      Created: Tue Sep 10, 2011  9:36 AM       Patient came to the lab needs labs put in.

## 2011-09-10 NOTE — Telephone Encounter (Signed)
Labs done.

## 2011-09-17 ENCOUNTER — Ambulatory Visit: Payer: Medicare Other | Admitting: Internal Medicine

## 2011-09-18 ENCOUNTER — Ambulatory Visit (INDEPENDENT_AMBULATORY_CARE_PROVIDER_SITE_OTHER): Payer: Medicare Other | Admitting: Internal Medicine

## 2011-09-18 ENCOUNTER — Encounter: Payer: Self-pay | Admitting: Internal Medicine

## 2011-09-18 VITALS — BP 132/70 | HR 61 | Temp 98.5°F | Ht 66.0 in | Wt 277.5 lb

## 2011-09-18 DIAGNOSIS — E669 Obesity, unspecified: Secondary | ICD-10-CM

## 2011-09-18 DIAGNOSIS — I10 Essential (primary) hypertension: Secondary | ICD-10-CM

## 2011-09-18 DIAGNOSIS — E119 Type 2 diabetes mellitus without complications: Secondary | ICD-10-CM

## 2011-09-18 DIAGNOSIS — Z0000 Encounter for general adult medical examination without abnormal findings: Secondary | ICD-10-CM

## 2011-09-18 MED ORDER — ATENOLOL-CHLORTHALIDONE 50-25 MG PO TABS: 1.0000 | ORAL_TABLET | Freq: Every day | ORAL | Status: DC

## 2011-09-18 MED ORDER — MELOXICAM 15 MG PO TABS: 15.0000 mg | ORAL_TABLET | Freq: Every day | ORAL | Status: DC

## 2011-09-18 MED ORDER — GLUCOSE BLOOD VI STRP: ORAL_STRIP | Status: DC

## 2011-09-18 MED ORDER — GLIMEPIRIDE 4 MG PO TABS: 4.0000 mg | ORAL_TABLET | Freq: Two times a day (BID) | ORAL | Status: DC

## 2011-09-18 MED ORDER — PHENTERMINE HCL 37.5 MG PO CAPS: 37.5000 mg | ORAL_CAPSULE | ORAL | Status: DC

## 2011-09-18 MED ORDER — METFORMIN HCL 500 MG PO TABS: 500.0000 mg | ORAL_TABLET | Freq: Two times a day (BID) | ORAL | Status: DC

## 2011-09-18 MED ORDER — ALENDRONATE SODIUM 70 MG PO TABS: 70.0000 mg | ORAL_TABLET | ORAL | Status: DC

## 2011-09-18 MED ORDER — LEVOTHYROXINE SODIUM 150 MCG PO TABS: 150.0000 ug | ORAL_TABLET | Freq: Every day | ORAL | Status: DC

## 2011-09-18 MED ORDER — LANCETS MISC: 1.0000 "application " | Freq: Two times a day (BID) | Status: DC

## 2011-09-18 NOTE — Patient Instructions (Signed)
Take all new medications as prescribed - the phentermine Continue all other medications as before Your refills are all sent to the pharmacy You are otherwise up to date with prevention at this time Please return in 6 mo with Lab testing done 3-5 days before

## 2011-09-18 NOTE — Progress Notes (Signed)
Subjective:    Patient ID: Kelsey Keller, female    DOB: 01-19-1941, 71 y.o.   MRN: [id number]  HPI  Here for wellness and f/u;  Overall doing ok;  Pt denies CP, worsening SOB, DOE, wheezing, orthopnea, PND, worsening LE edema, palpitations, dizziness or syncope.  Pt denies neurological change such as new Headache, facial or extremity weakness.  Pt denies polydipsia, polyuria, or low sugar symptoms. Pt states overall good compliance with treatment and medications, good tolerability, and trying to follow lower cholesterol diet.  Pt denies worsening depressive symptoms, suicidal ideation or panic. No fever, wt loss, night sweats, loss of appetite, or other constitutional symptoms.  Pt states good ability with ADL's, low fall risk, home safety reviewed and adequate, no significant changes in hearing or vision, and occasionally active with exercise.  S/p left knee replacement approx 1 yr with some numbness just below the knee persistent since then. Going to the gym regularly with about 10 lbs wt loss; but wants to consider med for wt loss. Past Medical History  Diagnosis Date  . HYPOTHYROIDISM 04/29/2007  . DIABETES MELLITUS, TYPE II 06/23/2007  . HYPERLIPIDEMIA 04/29/2007  . HYPOKALEMIA 05/02/2009  . HYPERTENSION 04/29/2007  . ALLERGIC RHINITIS 04/29/2007  . OSTEOARTHRITIS, KNEE, LEFT 06/23/2007  . SPINAL STENOSIS, LUMBAR 04/29/2007  . LOW BACK PAIN 04/29/2007  . TOE PAIN 02/20/2010  . OSTEOPOROSIS NOS 05/12/2007  . HIATAL HERNIA, HX OF 04/29/2007   Past Surgical History  Procedure Date  . Total knee arthroplasty 2006    Right  . Total knee arthroplasty 2006    right    reports that she has never smoked. She does not have any smokeless tobacco history on file. She reports that she drinks alcohol. She reports that she does not use illicit drugs. family history includes Coronary artery disease in her other and Diabetes in her other. No Known Allergies Current Outpatient Prescriptions on File  Prior to Visit  Medication Sig Dispense Refill  . aspirin 81 MG EC tablet Take 81 mg by mouth daily.        . calcium carbonate (TUMS EX) 750 MG chewable tablet Chew 1 tablet by mouth 2 (two) times daily.         Review of Systems Review of Systems  Constitutional: Negative for diaphoresis, activity change, appetite change and unexpected weight change.  HENT: Negative for hearing loss, ear pain, facial swelling, mouth sores and neck stiffness.   Eyes: Negative for pain, redness and visual disturbance.  Respiratory: Negative for shortness of breath and wheezing.   Cardiovascular: Negative for chest pain and palpitations.  Gastrointestinal: Negative for diarrhea, blood in stool, abdominal distention and rectal pain.  Genitourinary: Negative for hematuria, flank pain and decreased urine volume.  Musculoskeletal: Negative for myalgias and joint swelling.  Skin: Negative for color change and wound.  Neurological: Negative for syncope and numbness.  Hematological: Negative for adenopathy.  Psychiatric/Behavioral: Negative for hallucinations, self-injury, decreased concentration and agitation.      Objective:   Physical Exam BP 132/70  Pulse 61  Temp(Src) 98.5 F (36.9 C) (Oral)  Ht 5\' 6"  (1.676 m)  Wt 277 lb 8 oz (125.873 kg)  BMI 44.79 kg/m2  SpO2 96% Physical Exam  VS noted Constitutional: Pt is oriented to person, place, and time. Appears well-developed and well-nourished.  HENT:  Head: Normocephalic and atraumatic.  Right Ear: External ear normal.  Left Ear: External ear normal.  Nose: Nose normal.  Mouth/Throat: Oropharynx is clear and moist.  Eyes: Conjunctivae and EOM are normal. Pupils are equal, round, and reactive to light.  Neck: Normal range of motion. Neck supple. No JVD present. No tracheal deviation present.  Cardiovascular: Normal rate, regular rhythm, normal heart sounds and intact distal pulses.   Pulmonary/Chest: Effort normal and breath sounds normal.   Abdominal: Soft. Bowel sounds are normal. There is no tenderness.  Musculoskeletal: Normal range of motion. Exhibits no edema.  Lymphadenopathy:  Has no cervical adenopathy.  Neurological: Pt is alert and oriented to person, place, and time. Pt has normal reflexes. No cranial nerve deficit.  Skin: Skin is warm and dry. No rash noted.  Psychiatric:  Has  normal mood and affect. Behavior is normal.     Assessment & Plan:

## 2011-09-22 ENCOUNTER — Encounter: Payer: Self-pay | Admitting: Internal Medicine

## 2011-09-22 NOTE — Assessment & Plan Note (Signed)
Ok for phentermine asd 

## 2011-09-22 NOTE — Assessment & Plan Note (Signed)
Consider decr med for wt loss

## 2011-09-22 NOTE — Assessment & Plan Note (Signed)
Consider decreased med for wt loss

## 2011-09-22 NOTE — Assessment & Plan Note (Signed)

## 2011-10-01 ENCOUNTER — Ambulatory Visit (HOSPITAL_COMMUNITY)
Admission: RE | Admit: 2011-10-01 | Discharge: 2011-10-01 | Disposition: A | Discharge status: Home / Self Care | Payer: Medicare Other | Source: Ambulatory Visit | Attending: Internal Medicine | Admitting: Internal Medicine

## 2011-10-01 DIAGNOSIS — Z1231 Encounter for screening mammogram for malignant neoplasm of breast: Secondary | ICD-10-CM

## 2012-02-05 ENCOUNTER — Other Ambulatory Visit: Payer: Self-pay | Admitting: Orthopedic Surgery

## 2012-02-05 DIAGNOSIS — M545 Low back pain, unspecified: Secondary | ICD-10-CM

## 2012-02-06 ENCOUNTER — Ambulatory Visit
Admission: RE | Admit: 2012-02-06 | Discharge: 2012-02-06 | Disposition: A | Discharge status: Home / Self Care | Payer: Medicare Other | Source: Ambulatory Visit | Attending: Orthopedic Surgery | Admitting: Orthopedic Surgery

## 2012-02-06 DIAGNOSIS — M545 Low back pain, unspecified: Secondary | ICD-10-CM

## 2012-02-08 ENCOUNTER — Other Ambulatory Visit: Payer: Medicare Other

## 2012-03-18 ENCOUNTER — Other Ambulatory Visit (INDEPENDENT_AMBULATORY_CARE_PROVIDER_SITE_OTHER): Payer: Medicare Other

## 2012-03-18 DIAGNOSIS — E785 Hyperlipidemia, unspecified: Secondary | ICD-10-CM

## 2012-03-18 DIAGNOSIS — E119 Type 2 diabetes mellitus without complications: Secondary | ICD-10-CM

## 2012-03-18 LAB — LIPID PANEL
Cholesterol: 203 mg/dL — ABNORMAL HIGH (ref 0–200)
Total CHOL/HDL Ratio: 3
Triglycerides: 175 mg/dL — ABNORMAL HIGH (ref 0.0–149.0)
VLDL: 35 mg/dL (ref 0.0–40.0)

## 2012-03-18 LAB — LDL CHOLESTEROL, DIRECT: Direct LDL: 113 mg/dL

## 2012-03-18 LAB — BASIC METABOLIC PANEL
Calcium: 9.5 mg/dL (ref 8.4–10.5)
Chloride: 95 mEq/L — ABNORMAL LOW (ref 96–112)
Creatinine, Ser: 0.8 mg/dL (ref 0.4–1.2)
Sodium: 133 mEq/L — ABNORMAL LOW (ref 135–145)

## 2012-03-25 ENCOUNTER — Ambulatory Visit (INDEPENDENT_AMBULATORY_CARE_PROVIDER_SITE_OTHER): Payer: Medicare Other | Admitting: Internal Medicine

## 2012-03-25 ENCOUNTER — Encounter: Payer: Self-pay | Admitting: Internal Medicine

## 2012-03-25 VITALS — BP 122/80 | HR 64 | Temp 97.3°F | Ht 66.0 in | Wt 256.5 lb

## 2012-03-25 DIAGNOSIS — Z0000 Encounter for general adult medical examination without abnormal findings: Secondary | ICD-10-CM

## 2012-03-25 DIAGNOSIS — E785 Hyperlipidemia, unspecified: Secondary | ICD-10-CM

## 2012-03-25 DIAGNOSIS — E119 Type 2 diabetes mellitus without complications: Secondary | ICD-10-CM

## 2012-03-25 DIAGNOSIS — I10 Essential (primary) hypertension: Secondary | ICD-10-CM

## 2012-03-25 MED ORDER — METFORMIN HCL 500 MG PO TABS: 500.0000 mg | ORAL_TABLET | Freq: Two times a day (BID) | ORAL | Status: DC

## 2012-03-25 MED ORDER — LEVOTHYROXINE SODIUM 150 MCG PO TABS: 150.0000 ug | ORAL_TABLET | Freq: Every day | ORAL | Status: DC

## 2012-03-25 MED ORDER — MELOXICAM 15 MG PO TABS: 15.0000 mg | ORAL_TABLET | Freq: Every day | ORAL | Status: DC

## 2012-03-25 MED ORDER — ALENDRONATE SODIUM 70 MG PO TABS: 70.0000 mg | ORAL_TABLET | ORAL | Status: DC

## 2012-03-25 MED ORDER — ATENOLOL-CHLORTHALIDONE 50-25 MG PO TABS: 1.0000 | ORAL_TABLET | Freq: Every day | ORAL | Status: DC

## 2012-03-25 MED ORDER — GLIMEPIRIDE 4 MG PO TABS: 4.0000 mg | ORAL_TABLET | Freq: Two times a day (BID) | ORAL | Status: DC

## 2012-03-25 NOTE — Patient Instructions (Addendum)
Continue all other medications as before Please continue your efforts at being more active, low cholesterol diabetic diet, and weight control. Please return if you change your mind about the flu shot Please return in 6 mo with Lab testing done 3-5 days before All of your medications were refilled today

## 2012-03-28 ENCOUNTER — Encounter: Payer: Self-pay | Admitting: Internal Medicine

## 2012-03-28 NOTE — Assessment & Plan Note (Signed)
With some increased a1c despite wt loss and recent steroid tx,  O/w stable overall by hx and exam, most recent data reviewed with pt, and pt to continue medical treatment as before Lab Results  Component Value Date   HGBA1C 7.5* 03/18/2012   HGBA1C 6.8* 09/10/2011   HGBA1C  Value: 6.9 (NOTE)                                                                       According to the ADA Clinical Practice Recommendations for 2011, when HbA1c is used as a screening test:   >=6.5%   Diagnostic of Diabetes Mellitus           (if abnormal result  is confirmed)  5.7-6.4%   Increased risk of developing Diabetes Mellitus  References:Diagnosis and Classification of Diabetes Mellitus,Diabetes Care,2011,34(Suppl 1):S62-S69 and Standards of Medical Care in         Diabetes - 2011,Diabetes Care,2011,34  (Suppl 1):S11-S61.* 11/13/2010   Lab Results  Component Value Date   MICROALBUR 1.5 09/10/2011   LDLCALC 83 09/10/2011   CREATININE 0.8 03/18/2012

## 2012-03-28 NOTE — Assessment & Plan Note (Signed)
stable overall by hx and exam, most recent data reviewed with pt, and pt to continue medical treatment as before Lab Results  Component Value Date   LDLCALC 83 09/10/2011

## 2012-03-28 NOTE — Assessment & Plan Note (Signed)
stable overall by hx and exam, most recent data reviewed with pt, and pt to continue medical treatment as before BP Readings from Last 3 Encounters:  03/25/12 122/80  09/18/11 132/70  10/15/10 144/74

## 2012-03-28 NOTE — Progress Notes (Signed)
Subjective:    Patient ID: Kelsey Keller, female    DOB: 1941/05/14, 71 y.o.   MRN: [id number]  HPI  Here to f/u; overall doing ok,  Pt denies chest pain, increased sob or doe, wheezing, orthopnea, PND, increased LE swelling, palpitations, dizziness or syncope.  Pt denies new neurological symptoms such as new headache, or facial or extremity weakness or numbness   Pt denies polydipsia, polyuria, or low sugar symptoms such as weakness or confusion improved with po intake.  Pt states overall good compliance with meds, trying to follow lower cholesterol, diabetic diet, wt overall stable but little exercise however. Had recent predpack for back pain and PT per ortho, and tramadol prn.  Has had intentional wt loss from mar 2012 277 to current 256.  Declines flu shot.   Pt denies fever, wt loss, night sweats, loss of appetite, or other constitutional symptoms  No other acute complaints Past Medical History  Diagnosis Date  . HYPOTHYROIDISM 04/29/2007  . DIABETES MELLITUS, TYPE II 06/23/2007  . HYPERLIPIDEMIA 04/29/2007  . HYPOKALEMIA 05/02/2009  . HYPERTENSION 04/29/2007  . ALLERGIC RHINITIS 04/29/2007  . OSTEOARTHRITIS, KNEE, LEFT 06/23/2007  . SPINAL STENOSIS, LUMBAR 04/29/2007  . LOW BACK PAIN 04/29/2007  . TOE PAIN 02/20/2010  . OSTEOPOROSIS NOS 05/12/2007  . HIATAL HERNIA, HX OF 04/29/2007   Past Surgical History  Procedure Date  . Total knee arthroplasty 2006    Right  . Total knee arthroplasty 2006    right    reports that she has never smoked. She does not have any smokeless tobacco history on file. She reports that she drinks alcohol. She reports that she does not use illicit drugs. family history includes Coronary artery disease in her other and Diabetes in her other. No Known Allergies Current Outpatient Prescriptions on File Prior to Visit  Medication Sig Dispense Refill  . aspirin 81 MG EC tablet Take 81 mg by mouth daily.        Marland Kitchen atenolol-chlorthalidone (TENORETIC) 50-25 MG  per tablet Take 1 tablet by mouth daily.  90 tablet  3  . calcium carbonate (TUMS EX) 750 MG chewable tablet Chew 1 tablet by mouth 2 (two) times daily.        Marland Kitchen glimepiride (AMARYL) 4 MG tablet Take 1 tablet (4 mg total) by mouth 2 (two) times daily.  180 tablet  3  . metFORMIN (GLUCOPHAGE) 500 MG tablet Take 1 tablet (500 mg total) by mouth 2 (two) times daily.  180 tablet  3  . levothyroxine (SYNTHROID, LEVOTHROID) 150 MCG tablet Take 1 tablet (150 mcg total) by mouth daily.  90 tablet  3  . phentermine 37.5 MG capsule Take 1 capsule (37.5 mg total) by mouth every morning.  30 capsule  2   Review of Systems  Constitutional: Negative for diaphoresis and unexpected weight change.  HENT: Negative for tinnitus.   Eyes: Negative for photophobia and visual disturbance.  Respiratory: Negative for choking and stridor.   Gastrointestinal: Negative for vomiting and blood in stool.  Genitourinary: Negative for hematuria and decreased urine volume.  Musculoskeletal: Negative for gait problem.  Skin: Negative for color change and wound.  Neurological: Negative for tremors and numbness.  Psychiatric/Behavioral: Negative for decreased concentration. The patient is not hyperactive.       Objective:   Physical Exam BP 122/80  Pulse 64  Temp 97.3 F (36.3 C) (Oral)  Ht 5\' 6"  (1.676 m)  Wt 256 lb 8 oz (116.348 kg)  BMI 41.40  kg/m2  SpO2 96% Physical Exam  VS noted, not ill appaering Constitutional: Pt appears well-developed and well-nourished.  HENT: Head: Normocephalic.  Right Ear: External ear normal.  Left Ear: External ear normal.  Eyes: Conjunctivae and EOM are normal. Pupils are equal, round, and reactive to light.  Neck: Normal range of motion. Neck supple.  Cardiovascular: Normal rate and regular rhythm.   Pulmonary/Chest: Effort normal and breath sounds normal.  Neurological: Pt is alert. Not confused  Skin: Skin is warm. No erythema.  Psychiatric: Pt behavior is normal. Thought  content normal.     Assessment & Plan:

## 2012-07-20 ENCOUNTER — Other Ambulatory Visit: Payer: Self-pay

## 2012-07-20 MED ORDER — METFORMIN HCL 500 MG PO TABS: 500.0000 mg | ORAL_TABLET | Freq: Two times a day (BID) | ORAL | Status: DC

## 2012-07-20 MED ORDER — ATENOLOL-CHLORTHALIDONE 50-25 MG PO TABS: 1.0000 | ORAL_TABLET | Freq: Every day | ORAL | Status: DC

## 2012-07-20 MED ORDER — LEVOTHYROXINE SODIUM 150 MCG PO TABS: 150.0000 ug | ORAL_TABLET | Freq: Every day | ORAL | Status: DC

## 2012-07-20 MED ORDER — ALENDRONATE SODIUM 70 MG PO TABS: 70.0000 mg | ORAL_TABLET | ORAL | Status: DC

## 2012-07-20 MED ORDER — MELOXICAM 15 MG PO TABS: 15.0000 mg | ORAL_TABLET | Freq: Every day | ORAL | Status: DC

## 2012-07-20 MED ORDER — GLIMEPIRIDE 4 MG PO TABS: 4.0000 mg | ORAL_TABLET | Freq: Two times a day (BID) | ORAL | Status: DC

## 2012-08-31 ENCOUNTER — Other Ambulatory Visit: Payer: Self-pay | Admitting: Neurosurgery

## 2012-08-31 ENCOUNTER — Ambulatory Visit
Admission: RE | Admit: 2012-08-31 | Discharge: 2012-08-31 | Disposition: A | Discharge status: Home / Self Care | Payer: Medicare Other | Source: Ambulatory Visit | Attending: Neurosurgery | Admitting: Neurosurgery

## 2012-08-31 DIAGNOSIS — M47816 Spondylosis without myelopathy or radiculopathy, lumbar region: Secondary | ICD-10-CM

## 2012-09-02 ENCOUNTER — Other Ambulatory Visit: Payer: Self-pay | Admitting: Neurosurgery

## 2012-09-02 DIAGNOSIS — M810 Age-related osteoporosis without current pathological fracture: Secondary | ICD-10-CM

## 2012-09-15 ENCOUNTER — Ambulatory Visit
Admission: RE | Admit: 2012-09-15 | Discharge: 2012-09-15 | Disposition: A | Discharge status: Home / Self Care | Payer: Medicare Other | Source: Ambulatory Visit | Attending: Neurosurgery | Admitting: Neurosurgery

## 2012-09-15 ENCOUNTER — Encounter: Payer: Self-pay | Admitting: Internal Medicine

## 2012-09-15 DIAGNOSIS — M810 Age-related osteoporosis without current pathological fracture: Secondary | ICD-10-CM

## 2012-09-17 ENCOUNTER — Other Ambulatory Visit (INDEPENDENT_AMBULATORY_CARE_PROVIDER_SITE_OTHER): Payer: Medicare Other

## 2012-09-17 DIAGNOSIS — Z0000 Encounter for general adult medical examination without abnormal findings: Secondary | ICD-10-CM

## 2012-09-17 DIAGNOSIS — E119 Type 2 diabetes mellitus without complications: Secondary | ICD-10-CM

## 2012-09-17 LAB — CBC WITH DIFFERENTIAL/PLATELET
Basophils Relative: 0.7 % (ref 0.0–3.0)
Eosinophils Absolute: 0.3 10*3/uL (ref 0.0–0.7)
Eosinophils Relative: 4.5 % (ref 0.0–5.0)
HCT: 43 % (ref 36.0–46.0)
Hemoglobin: 14.7 g/dL (ref 12.0–15.0)
MCHC: 34.2 g/dL (ref 30.0–36.0)
MCV: 92.5 fl (ref 78.0–100.0)
Monocytes Absolute: 0.5 10*3/uL (ref 0.1–1.0)
Neutro Abs: 3.9 10*3/uL (ref 1.4–7.7)
RBC: 4.65 Mil/uL (ref 3.87–5.11)

## 2012-09-17 LAB — LIPID PANEL
Cholesterol: 204 mg/dL — ABNORMAL HIGH (ref 0–200)
HDL: 68 mg/dL (ref >39.00)
Total CHOL/HDL Ratio: 3
VLDL: 30.4 mg/dL (ref 0.0–40.0)

## 2012-09-17 LAB — URINALYSIS, ROUTINE W REFLEX MICROSCOPIC
Bilirubin Urine: NEGATIVE
Hgb urine dipstick: NEGATIVE
Total Protein, Urine: NEGATIVE
Urine Glucose: NEGATIVE
pH: 6 (ref 5.0–8.0)

## 2012-09-17 LAB — BASIC METABOLIC PANEL
Calcium: 9.9 mg/dL (ref 8.4–10.5)
Chloride: 98 mEq/L (ref 96–112)
Creatinine, Ser: 1.1 mg/dL (ref 0.4–1.2)
GFR: 51.13 mL/min — ABNORMAL LOW (ref >60.00)

## 2012-09-17 LAB — HEMOGLOBIN A1C: Hgb A1c MFr Bld: 7.5 % — ABNORMAL HIGH (ref 4.6–6.5)

## 2012-09-17 LAB — HEPATIC FUNCTION PANEL
Bilirubin, Direct: 0.2 mg/dL (ref 0.0–0.3)
Total Bilirubin: 1.2 mg/dL (ref 0.3–1.2)

## 2012-09-17 LAB — LDL CHOLESTEROL, DIRECT: Direct LDL: 111.1 mg/dL

## 2012-09-23 ENCOUNTER — Ambulatory Visit (INDEPENDENT_AMBULATORY_CARE_PROVIDER_SITE_OTHER): Payer: Medicare Other | Admitting: Internal Medicine

## 2012-09-23 ENCOUNTER — Encounter: Payer: Self-pay | Admitting: Internal Medicine

## 2012-09-23 VITALS — BP 130/80 | HR 72 | Temp 97.9°F | Ht 66.0 in | Wt 268.4 lb

## 2012-09-23 DIAGNOSIS — M545 Low back pain, unspecified: Secondary | ICD-10-CM

## 2012-09-23 DIAGNOSIS — Z0000 Encounter for general adult medical examination without abnormal findings: Secondary | ICD-10-CM

## 2012-09-23 DIAGNOSIS — E785 Hyperlipidemia, unspecified: Secondary | ICD-10-CM

## 2012-09-23 DIAGNOSIS — E119 Type 2 diabetes mellitus without complications: Secondary | ICD-10-CM

## 2012-09-23 MED ORDER — METFORMIN HCL 500 MG PO TABS: ORAL_TABLET | ORAL | Status: DC

## 2012-09-23 NOTE — Patient Instructions (Addendum)
OK to increase the metformin to 1000 mg in the AM, and 500 mg in the PM (this was sent to the [facility] pharmacy) Please continue all other medications as before, and refills have been done if requested. Please have the pharmacy call with any other refills you may need. Please remember to followup with your GYN for the yearly pap smear and/or mammogram Please continue your efforts at being more active, low cholesterol diet, and weight control (as you can) You are otherwise up to date with prevention measures today. We will call for recent records from Dr Channing Mutters, and try to do a support letter later for your proposed back surgury Thank you for enrolling in MyChart. Please follow the instructions below to securely access your online medical record. MyChart allows you to send messages to your doctor, view your test results, renew your prescriptions, schedule appointments, and more. To Log into My Chart online, please go by [employer] or [employer] to [email or web address], or download the MyChart App from the [employer] of [employer] .  Your Username is: [email or web address] (pass [phone]) Please send a practice Message on Mychart later today. Please return in 6 months, or sooner if needed, with Lab testing done 3-5 days before

## 2012-09-23 NOTE — Assessment & Plan Note (Signed)
Declines statin for now, wants to get through her back surgury first

## 2012-09-23 NOTE — Assessment & Plan Note (Signed)
Mild uncontrolled, to increase the metformin to 1000 am, 500 pm, cont diet and efforts at wt control,  to f/u any worsening symptoms or concerns

## 2012-09-23 NOTE — Assessment & Plan Note (Signed)

## 2012-09-23 NOTE — Assessment & Plan Note (Signed)
Ongoing pain markedly activity limiting, severe predicament and health threatening with respect to her ability to improve her co-morbids such as DM, also with recent fall and functional impairment, not expected to improve, should have surgury as proposed per Dr Channing Mutters, Neurosurgury

## 2012-09-23 NOTE — Progress Notes (Signed)
Subjective:    Patient ID: Kelsey Keller, female    DOB: October 21, 1940, 72 y.o.   MRN: [id number]  HPI Here for wellness and f/u;  Overall doing ok;  Pt denies CP, worsening SOB, DOE, wheezing, orthopnea, PND, worsening LE edema, palpitations, dizziness or syncope.  Pt denies neurological change such as new headache, facial or extremity weakness.  Pt denies polydipsia, polyuria, or low sugar symptoms. Pt states overall good compliance with treatment and medications, good tolerability, and has been trying to follow lower cholesterol diet.  Pt denies worsening depressive symptoms, suicidal ideation or panic. No fever, night sweats, wt loss, loss of appetite, or other constitutional symptoms.  Pt states good ability with ADL's, has low fall risk, home safety reviewed and adequate, no other significant changes in hearing or vision, and only occasionally active with exercise.  Had recent feb 2014 plain back films with T12/l1 fx, has scoliosis and spinal stenosis surgury that is quite involved with rods and spacers needed. Has persistent back pain, worse on the left with left leg giveaway., despite recent MRI, steroid shot, PT and chiropracter and accupunture. Has also seen neurology.   Pt fell for the first time with tripping on rug at church 2 days ago,but states pain not worse, declines need for f/u films. Has osteopenia by DXA ith t-score -2.0.  Pain ok for now with ES tylneol and tramadol and mobic. Walks with cane daily. Past Medical History  Diagnosis Date  . HYPOTHYROIDISM 04/29/2007  . DIABETES MELLITUS, TYPE II 06/23/2007  . HYPERLIPIDEMIA 04/29/2007  . HYPOKALEMIA 05/02/2009  . HYPERTENSION 04/29/2007  . ALLERGIC RHINITIS 04/29/2007  . OSTEOARTHRITIS, KNEE, LEFT 06/23/2007  . SPINAL STENOSIS, LUMBAR 04/29/2007  . LOW BACK PAIN 04/29/2007  . TOE PAIN 02/20/2010  . OSTEOPOROSIS NOS 05/12/2007  . HIATAL HERNIA, HX OF 04/29/2007   Past Surgical History  Procedure Laterality Date  . Total knee  arthroplasty  2006    Right  . Total knee arthroplasty  2006    right    reports that she has never smoked. She does not have any smokeless tobacco history on file. She reports that  drinks alcohol. She reports that she does not use illicit drugs. family history includes Coronary artery disease in her other and Diabetes in her other. No Known Allergies Current Outpatient Prescriptions on File Prior to Visit  Medication Sig Dispense Refill  . alendronate (FOSAMAX) 70 MG tablet Take 1 tablet (70 mg total) by mouth every 7 (seven) days. Take with a full glass of water on an empty stomach.  12 tablet  3  . aspirin 81 MG EC tablet Take 81 mg by mouth daily.        Marland Kitchen atenolol-chlorthalidone (TENORETIC) 50-25 MG per tablet Take 1 tablet by mouth daily.  90 tablet  3  . calcium carbonate (TUMS EX) 750 MG chewable tablet Chew 1 tablet by mouth 2 (two) times daily.        Marland Kitchen glimepiride (AMARYL) 4 MG tablet Take 1 tablet (4 mg total) by mouth 2 (two) times daily.  180 tablet  3  . glucose blood test strip Use as directed two times a day, diagnosis code 250.00      . Lancets MISC Apply 1 application topically 2 (two) times daily. Use as directed two times a day, diagnosis code 250.00      . levothyroxine (SYNTHROID, LEVOTHROID) 150 MCG tablet Take 1 tablet (150 mcg total) by mouth daily.  90 tablet  3  .  meloxicam (MOBIC) 15 MG tablet Take 1 tablet (15 mg total) by mouth daily.  90 tablet  3   No current facility-administered medications on file prior to visit.   Review of Systems Constitutional: Negative for diaphoresis, activity change, appetite change or unexpected weight change.  HENT: Negative for hearing loss, ear pain, facial swelling, mouth sores and neck stiffness.   Eyes: Negative for pain, redness and visual disturbance.  Respiratory: Negative for shortness of breath and wheezing.   Cardiovascular: Negative for chest pain and palpitations.  Gastrointestinal: Negative for diarrhea, blood in  stool, abdominal distention or other pain Genitourinary: Negative for hematuria, flank pain or change in urine volume.  Musculoskeletal: Negative for myalgias and joint swelling.  Skin: Negative for color change and wound.  Neurological: Negative for syncope and numbness. other than noted Hematological: Negative for adenopathy.  Psychiatric/Behavioral: Negative for hallucinations, self-injury, decreased concentration and agitation. , but frustrated and tearful when speaking of her back predicament as this is markedly activity limiting, hard to lose wt    Objective:   Physical Exam BP 130/80  Pulse 72  Temp(Src) 97.9 F (36.6 C) (Oral)  Ht 5\' 6"  (1.676 m)  Wt 268 lb 6 oz (121.734 kg)  BMI 43.34 kg/m2  SpO2 95% VS noted, morbid obese Constitutional: Pt is oriented to person, place, and time. Appears well-developed and well-nourished.  Head: Normocephalic and atraumatic.  Right Ear: External ear normal.  Left Ear: External ear normal.  Nose: Nose normal.  Mouth/Throat: Oropharynx is clear and moist.  Eyes: Conjunctivae and EOM are normal. Pupils are equal, round, and reactive to light.  Neck: Normal range of motion. Neck supple. No JVD present. No tracheal deviation present.  Cardiovascular: Normal rate, regular rhythm, normal heart sounds and intact distal pulses.   Pulmonary/Chest: Effort normal and breath sounds normal.  Abdominal: Soft. Bowel sounds are normal. There is no tenderness. No HSM  Musculoskeletal: Normal range of motion. Exhibits no edema.Spine with diffuse marked tender to upper lumbar midline, no swelling or erythema noted  Lymphadenopathy:  Has no cervical adenopathy.  Neurological: Pt is alert and oriented to person, place, and time. Pt has normal reflexes. No cranial nerve deficit.  Skin: Skin is warm and dry. No rash noted.  Psychiatric:  Has  normal mood and affect except tears when speaking of her pain and impairment. Behavior is normal.     Assessment & Plan:

## 2012-11-09 ENCOUNTER — Other Ambulatory Visit: Payer: Self-pay | Admitting: Internal Medicine

## 2012-11-09 DIAGNOSIS — Z1231 Encounter for screening mammogram for malignant neoplasm of breast: Secondary | ICD-10-CM

## 2012-11-20 ENCOUNTER — Ambulatory Visit (HOSPITAL_COMMUNITY)
Admission: RE | Admit: 2012-11-20 | Discharge: 2012-11-20 | Disposition: A | Discharge status: Home / Self Care | Payer: Medicare Other | Source: Ambulatory Visit | Attending: Internal Medicine | Admitting: Internal Medicine

## 2012-11-20 DIAGNOSIS — Z1231 Encounter for screening mammogram for malignant neoplasm of breast: Secondary | ICD-10-CM | POA: Insufficient documentation

## 2012-12-21 ENCOUNTER — Encounter: Payer: Self-pay | Admitting: Internal Medicine

## 2013-03-30 ENCOUNTER — Ambulatory Visit: Payer: Medicare Other | Admitting: Internal Medicine

## 2013-04-15 ENCOUNTER — Encounter: Payer: Self-pay | Admitting: Internal Medicine

## 2013-04-15 ENCOUNTER — Ambulatory Visit (INDEPENDENT_AMBULATORY_CARE_PROVIDER_SITE_OTHER): Payer: Medicare Other | Admitting: Internal Medicine

## 2013-04-15 VITALS — BP 110/64 | HR 71 | Temp 97.2°F | Ht 65.0 in | Wt 259.0 lb

## 2013-04-15 DIAGNOSIS — E119 Type 2 diabetes mellitus without complications: Secondary | ICD-10-CM

## 2013-04-15 DIAGNOSIS — E785 Hyperlipidemia, unspecified: Secondary | ICD-10-CM

## 2013-04-15 DIAGNOSIS — Z0000 Encounter for general adult medical examination without abnormal findings: Secondary | ICD-10-CM

## 2013-04-15 DIAGNOSIS — Z23 Encounter for immunization: Secondary | ICD-10-CM

## 2013-04-15 DIAGNOSIS — I10 Essential (primary) hypertension: Secondary | ICD-10-CM

## 2013-04-15 MED ORDER — LANCETS MISC: 1.0000 "application " | Freq: Two times a day (BID) | Status: DC

## 2013-04-15 MED ORDER — MELOXICAM 15 MG PO TABS: 15.0000 mg | ORAL_TABLET | Freq: Every day | ORAL | Status: DC

## 2013-04-15 MED ORDER — ATENOLOL-CHLORTHALIDONE 50-25 MG PO TABS: 1.0000 | ORAL_TABLET | Freq: Every day | ORAL | Status: DC

## 2013-04-15 MED ORDER — GLIMEPIRIDE 4 MG PO TABS: 4.0000 mg | ORAL_TABLET | Freq: Two times a day (BID) | ORAL | Status: DC

## 2013-04-15 MED ORDER — GLUCOSE BLOOD VI STRP: ORAL_STRIP | Status: DC

## 2013-04-15 MED ORDER — METFORMIN HCL 500 MG PO TABS: ORAL_TABLET | ORAL | Status: DC

## 2013-04-15 MED ORDER — ALENDRONATE SODIUM 70 MG PO TABS: 70.0000 mg | ORAL_TABLET | ORAL | Status: DC

## 2013-04-15 MED ORDER — LEVOTHYROXINE SODIUM 150 MCG PO TABS: 150.0000 ug | ORAL_TABLET | Freq: Every day | ORAL | Status: DC

## 2013-04-15 NOTE — Addendum Note (Signed)
Addended by: Scharlene Gloss B on: 04/15/2013 02:55 PM   Modules accepted: Orders

## 2013-04-15 NOTE — Assessment & Plan Note (Signed)
stable overall by history and exam, recent data reviewed with pt, and pt to continue medical treatment as before,  to f/u any worsening symptoms or concerns Lab Results  Component Value Date   HGBA1C 7.5* 09/17/2012    

## 2013-04-15 NOTE — Assessment & Plan Note (Signed)
stable overall by history and exam, recent data reviewed with pt, and pt to continue medical treatment as before,  to f/u any worsening symptoms or concerns Lab Results  Component Value Date   LDLCALC 83 09/10/2011    

## 2013-04-15 NOTE — Progress Notes (Signed)
Subjective:    Patient ID: Kelsey Keller, female    DOB: Jan 22, 1941, 72 y.o.   MRN: [id number]  HPI  Here to f/u; overall doing ok,  Pt denies chest pain, increased sob or doe, wheezing, orthopnea, PND, increased LE swelling, palpitations, dizziness or syncope.  Pt denies polydipsia, polyuria, or low sugar symptoms such as weakness or confusion improved with po intake.  Pt denies new neurological symptoms such as new headache, or facial or extremity weakness or numbness.   Pt states overall good compliance with meds, has been trying to follow lower cholesterol, diabetic diet, with wt overall stable,  but little exercise however.  S/p fusion revision t10 through s1 sept 19 per NS in [place].  Also with disc surgury recently April 1.  Pt continues to have recurring LBP though much improved, bowel or bladder change, fever, wt loss,  worsening LE pain/numbness/weakness, gait change or falls.  Getting PT at home.  No falls.  No overt bleeding or bruising. Declines flu shot today Past Medical History  Diagnosis Date  . HYPOTHYROIDISM 04/29/2007  . DIABETES MELLITUS, TYPE II 06/23/2007  . HYPERLIPIDEMIA 04/29/2007  . HYPOKALEMIA 05/02/2009  . HYPERTENSION 04/29/2007  . ALLERGIC RHINITIS 04/29/2007  . OSTEOARTHRITIS, KNEE, LEFT 06/23/2007  . SPINAL STENOSIS, LUMBAR 04/29/2007  . LOW BACK PAIN 04/29/2007  . TOE PAIN 02/20/2010  . OSTEOPOROSIS NOS 05/12/2007  . HIATAL HERNIA, HX OF 04/29/2007   Past Surgical History  Procedure Laterality Date  . Total knee arthroplasty  2006    Right  . Total knee arthroplasty  2006    right    reports that she has never smoked. She does not have any smokeless tobacco history on file. She reports that  drinks alcohol. She reports that she does not use illicit drugs. family history includes Coronary artery disease in her other; Diabetes in her other. No Known Allergies' Current Outpatient Prescriptions on File Prior to Visit  Medication Sig Dispense Refill  .  aspirin 81 MG EC tablet Take 81 mg by mouth daily.        . calcium carbonate (TUMS EX) 750 MG chewable tablet Chew 1 tablet by mouth 2 (two) times daily.         No current facility-administered medications on file prior to visit.   Review of Systems  Constitutional: Negative for unexpected weight change, or unusual diaphoresis  HENT: Negative for tinnitus.   Eyes: Negative for photophobia and visual disturbance.  Respiratory: Negative for choking and stridor.   Gastrointestinal: Negative for vomiting and blood in stool.  Genitourinary: Negative for hematuria and decreased urine volume.  Musculoskeletal: Negative for acute joint swelling Skin: Negative for color change and wound.  Neurological: Negative for tremors and numbness other than noted  Psychiatric/Behavioral: Negative for decreased concentration or  hyperactivity.       Objective:   Physical Exam BP 110/64  Pulse 71  Temp(Src) 97.2 F (36.2 C) (Oral)  Ht 5\' 5"  (1.651 m)  Wt 259 lb (117.482 kg)  BMI 43.1 kg/m2  SpO2 94% VS noted, wearing back support Constitutional: Pt appears well-developed and well-nourished.  HENT: Head: NCAT.  Right Ear: External ear normal.  Left Ear: External ear normal.  Eyes: Conjunctivae and EOM are normal. Pupils are equal, round, and reactive to light.  Neck: Normal range of motion. Neck supple.  Cardiovascular: Normal rate and regular rhythm.   Pulmonary/Chest: Effort normal and breath sounds normal.  Abd:  Soft, NT, non-distended, + BS Neurological: Pt  is alert. Not confused  Skin: Skin is warm. No erythema.  Psychiatric: Pt behavior is normal. Thought content normal.     Assessment & Plan:

## 2013-04-15 NOTE — Patient Instructions (Addendum)
You had the Prevnar pneumonia shot today Please continue all other medications as before, and refills have been done if requested. I think we can hold off on more blood work today  To Log into My Chart online, please go by [employer] or [employer] to [email or web address], or download the MyChart App from the [employer] of [employer] . Your Username is: [email or web address] (pass [id number])  Please return in 6 months, or sooner if needed, with Lab testing done 3-5 days before

## 2013-04-15 NOTE — Assessment & Plan Note (Signed)
stable overall by history and exam, recent data reviewed with pt, and pt to continue medical treatment as before,  to f/u any worsening symptoms or concerns BP Readings from Last 3 Encounters:  04/15/13 110/64  09/23/12 130/80  03/25/12 122/80

## 2013-05-04 ENCOUNTER — Telehealth: Payer: Self-pay

## 2013-05-04 MED ORDER — LANCETS MISC: 1.0000 "application " | Freq: Two times a day (BID) | Status: DC

## 2013-05-04 MED ORDER — GLUCOSE BLOOD VI STRP: ORAL_STRIP | Status: DC

## 2013-05-04 NOTE — Telephone Encounter (Signed)
Strips and lancets sent to pharmacy 

## 2013-05-20 ENCOUNTER — Other Ambulatory Visit: Payer: Self-pay

## 2013-07-12 ENCOUNTER — Telehealth: Payer: Self-pay

## 2013-07-12 NOTE — Telephone Encounter (Signed)
The patient is hoping to get something called in for a sinus infection.  She states she does not want to come in, but is hoping to pick up an rx from the pharmacy.   Callback [phone]

## 2013-07-13 NOTE — Telephone Encounter (Signed)
Very sorry, office policy is such that OV is needed for antibx tx, due to the fact the problem sometimes is not infection, and we try to use antibx appropriately

## 2013-07-13 NOTE — Telephone Encounter (Signed)
Patient informed and did schedule appointment for next week.

## 2013-07-21 ENCOUNTER — Other Ambulatory Visit (INDEPENDENT_AMBULATORY_CARE_PROVIDER_SITE_OTHER): Payer: Medicare HMO

## 2013-07-21 ENCOUNTER — Encounter: Payer: Self-pay | Admitting: Internal Medicine

## 2013-07-21 ENCOUNTER — Ambulatory Visit (INDEPENDENT_AMBULATORY_CARE_PROVIDER_SITE_OTHER): Payer: Medicare HMO | Admitting: Internal Medicine

## 2013-07-21 VITALS — BP 128/70 | HR 65 | Temp 98.0°F | Resp 16 | Wt 265.0 lb

## 2013-07-21 DIAGNOSIS — Z0000 Encounter for general adult medical examination without abnormal findings: Secondary | ICD-10-CM

## 2013-07-21 DIAGNOSIS — J019 Acute sinusitis, unspecified: Secondary | ICD-10-CM

## 2013-07-21 DIAGNOSIS — E119 Type 2 diabetes mellitus without complications: Secondary | ICD-10-CM

## 2013-07-21 DIAGNOSIS — M519 Unspecified thoracic, thoracolumbar and lumbosacral intervertebral disc disorder: Secondary | ICD-10-CM

## 2013-07-21 DIAGNOSIS — I10 Essential (primary) hypertension: Secondary | ICD-10-CM

## 2013-07-21 DIAGNOSIS — E785 Hyperlipidemia, unspecified: Secondary | ICD-10-CM

## 2013-07-21 LAB — LIPID PANEL
CHOL/HDL RATIO: 3
Cholesterol: 210 mg/dL — ABNORMAL HIGH (ref 0–200)
HDL: 68.2 mg/dL (ref >39.00)
Triglycerides: 159 mg/dL — ABNORMAL HIGH (ref 0.0–149.0)
VLDL: 31.8 mg/dL (ref 0.0–40.0)

## 2013-07-21 LAB — BASIC METABOLIC PANEL
BUN: 23 mg/dL (ref 6–23)
CO2: 31 mEq/L (ref 19–32)
CREATININE: 1 mg/dL (ref 0.4–1.2)
Calcium: 10.1 mg/dL (ref 8.4–10.5)
Chloride: 101 mEq/L (ref 96–112)
GFR: 55.56 mL/min — ABNORMAL LOW (ref >60.00)
Glucose, Bld: 95 mg/dL (ref 70–99)
Potassium: 4.2 mEq/L (ref 3.5–5.1)
Sodium: 140 mEq/L (ref 135–145)

## 2013-07-21 LAB — HEPATIC FUNCTION PANEL
ALK PHOS: 81 U/L (ref 39–117)
ALT: 47 U/L — AB (ref 0–35)
AST: 56 U/L — ABNORMAL HIGH (ref 0–37)
Albumin: 4.1 g/dL (ref 3.5–5.2)
BILIRUBIN DIRECT: 0.1 mg/dL (ref 0.0–0.3)
TOTAL PROTEIN: 7.4 g/dL (ref 6.0–8.3)
Total Bilirubin: 0.5 mg/dL (ref 0.3–1.2)

## 2013-07-21 LAB — LDL CHOLESTEROL, DIRECT: Direct LDL: 107.5 mg/dL

## 2013-07-21 LAB — HEMOGLOBIN A1C: Hgb A1c MFr Bld: 7.9 % — ABNORMAL HIGH (ref 4.6–6.5)

## 2013-07-21 MED ORDER — LEVOFLOXACIN 250 MG PO TABS: 250.0000 mg | ORAL_TABLET | Freq: Every day | ORAL | Status: DC

## 2013-07-21 MED ORDER — ATENOLOL-CHLORTHALIDONE 50-25 MG PO TABS: 1.0000 | ORAL_TABLET | Freq: Every day | ORAL | Status: DC

## 2013-07-21 MED ORDER — LEVOTHYROXINE SODIUM 150 MCG PO TABS
150.0000 ug | ORAL_TABLET | Freq: Every day | ORAL | Status: DC
Start: 2013-07-21 — End: 2014-01-21

## 2013-07-21 MED ORDER — GLIMEPIRIDE 4 MG PO TABS: 4.0000 mg | ORAL_TABLET | Freq: Two times a day (BID) | ORAL | Status: DC

## 2013-07-21 MED ORDER — HYDROCODONE-HOMATROPINE 5-1.5 MG/5ML PO SYRP: 5.0000 mL | ORAL_SOLUTION | Freq: Four times a day (QID) | ORAL | Status: DC | PRN

## 2013-07-21 MED ORDER — MELOXICAM 15 MG PO TABS: 15.0000 mg | ORAL_TABLET | Freq: Every day | ORAL | Status: DC

## 2013-07-21 MED ORDER — METFORMIN HCL 500 MG PO TABS: ORAL_TABLET | ORAL | Status: DC

## 2013-07-21 MED ORDER — ALENDRONATE SODIUM 70 MG PO TABS: 70.0000 mg | ORAL_TABLET | ORAL | Status: DC

## 2013-07-21 NOTE — Assessment & Plan Note (Signed)
stable overall by history and exam, recent data reviewed with pt, and pt to continue medical treatment as before,  to f/u any worsening symptoms or concerns Lab Results  Component Value Date   LDLCALC 83 09/10/2011

## 2013-07-21 NOTE — Progress Notes (Signed)
Pre-visit discussion using our clinic review tool. No additional management support is needed unless otherwise documented below in the visit note.  

## 2013-07-21 NOTE — Patient Instructions (Signed)
Please take all new medication as prescribed - the antibiotic and cough medicine Please continue all other medications as before, and refills have been done if requested -to the mailorder Please have the pharmacy call with any other refills you may need. Please go to the LAB in the Basement (turn left off the elevator) for the tests to be done today You will be contacted by phone if any changes need to be made immediately.  Otherwise, you will receive a letter about your results with an explanation, but please check with MyChart first.  Please remember to sign up for My Chart if you have not done so, as this will be important to you in the future with finding out test results, communicating by private email, and scheduling acute appointments online when needed.  OK to cancer the April 2015 appt  Please return in 6 months, or sooner if needed, with Lab testing done 3-5 days before

## 2013-07-21 NOTE — Assessment & Plan Note (Signed)
stable overall by history and exam, recent data reviewed with pt, and pt to continue medical treatment as before,  to f/u any worsening symptoms or concerns BP Readings from Last 3 Encounters:  07/21/13 128/70  04/15/13 110/64  09/23/12 130/80

## 2013-07-21 NOTE — Progress Notes (Signed)
Subjective:    Patient ID: Kelsey Keller, female    DOB: [birth year], 73 y.o.   MRN: [id number]  HPI    Here with 2-3 days acute onset fever, facial pain, pressure, headache, general weakness and malaise, and greenish d/c, with mild ST and signifi cough, cant sleep at night,  but pt denies chest pain, wheezing, increased sob or doe, orthopnea, PND, increased LE swelling, palpitations, dizziness or syncope.  Past Medical History  Diagnosis Date  . HYPOTHYROIDISM 04/29/2007  . DIABETES MELLITUS, TYPE II 06/23/2007  . HYPERLIPIDEMIA 04/29/2007  . HYPOKALEMIA 05/02/2009  . HYPERTENSION 04/29/2007  . ALLERGIC RHINITIS 04/29/2007  . OSTEOARTHRITIS, KNEE, LEFT 06/23/2007  . SPINAL STENOSIS, LUMBAR 04/29/2007  . LOW BACK PAIN 04/29/2007  . TOE PAIN 02/20/2010  . OSTEOPOROSIS NOS 05/12/2007  . HIATAL HERNIA, HX OF 04/29/2007   Past Surgical History  Procedure Laterality Date  . Total knee arthroplasty  2006    Right  . Total knee arthroplasty  2006    right    reports that she has never smoked. She does not have any smokeless tobacco history on file. She reports that she drinks alcohol. She reports that she does not use illicit drugs. family history includes Coronary artery disease in her other; Diabetes in her other. No Known Allergies Current Outpatient Prescriptions on File Prior to Visit  Medication Sig Dispense Refill  . alendronate (FOSAMAX) 70 MG tablet Take 1 tablet (70 mg total) by mouth every 7 (seven) days. Take with a full glass of water on an empty stomach.  12 tablet  3  . aspirin 81 MG EC tablet Take 81 mg by mouth daily.        Marland Kitchen atenolol-chlorthalidone (TENORETIC) 50-25 MG per tablet Take 1 tablet by mouth daily.  90 tablet  3  . calcium carbonate (TUMS EX) 750 MG chewable tablet Chew 1 tablet by mouth 2 (two) times daily.        Marland Kitchen glimepiride (AMARYL) 4 MG tablet Take 1 tablet (4 mg total) by mouth 2 (two) times daily.  180 tablet  3  . glucose blood test strip Use as  directed two times a day, diagnosis code 250.00  200 each  3  . Lancets MISC Apply 1 application topically 2 (two) times daily. Use as directed two times a day, diagnosis code 250.00  200 each  3  . levothyroxine (SYNTHROID, LEVOTHROID) 150 MCG tablet Take 1 tablet (150 mcg total) by mouth daily.  90 tablet  3  . meloxicam (MOBIC) 15 MG tablet Take 1 tablet (15 mg total) by mouth daily.  90 tablet  3  . metFORMIN (GLUCOPHAGE) 500 MG tablet 2 tabs by mouth in the AM, and 1 tab in the PM  270 tablet  3   No current facility-administered medications on file prior to visit.   Review of Systems  Constitutional: Negative for unexpected weight change, or unusual diaphoresis  HENT: Negative for tinnitus.   Eyes: Negative for photophobia and visual disturbance.  Respiratory: Negative for choking and stridor.   Gastrointestinal: Negative for vomiting and blood in stool.  Genitourinary: Negative for hematuria and decreased urine volume.  Musculoskeletal: Negative for acute joint swelling Skin: Negative for color change and wound.  Neurological: Negative for tremors and numbness other than noted  Psychiatric/Behavioral: Negative for decreased concentration or  hyperactivity.       Objective:   Physical Exam BP 128/70  Pulse 65  Temp(Src) 98 F (36.7 C) (Oral)  Resp 16  Wt 265 lb 0.6 oz (120.221 kg)  SpO2 97% VS noted, mild ill Constitutional: Pt appears well-developed and well-nourished.  HENT: Head: NCAT.  Right Ear: External ear normal.  Left Ear: External ear normal.  Bilat tm's with mild erythema.  Max sinus areas mild tender.  Pharynx with mild erythema, no exudate Eyes: Conjunctivae and EOM are normal. Pupils are equal, round, and reactive to light.  Neck: Normal range of motion. Neck supple.  Cardiovascular: Normal rate and regular rhythm.   Pulmonary/Chest: Effort normal and breath sounds normal.  Neurological: Pt is alert. Not confused  Skin: Skin is warm. No erythema. except +  rosacea Psychiatric: Pt behavior is normal. Thought content normal.      Assessment & Plan:

## 2013-07-21 NOTE — Assessment & Plan Note (Signed)
stable overall by history and exam, recent data reviewed with pt, and pt to continue medical treatment as before,  to f/u any worsening symptoms or concerns Lab Results  Component Value Date   HGBA1C 7.5* 09/17/2012

## 2013-07-21 NOTE — Assessment & Plan Note (Signed)
Mild to mod, for antibx course,  to f/u any worsening symptoms or concerns 

## 2013-08-03 ENCOUNTER — Ambulatory Visit (INDEPENDENT_AMBULATORY_CARE_PROVIDER_SITE_OTHER): Payer: Medicare HMO | Admitting: Internal Medicine

## 2013-08-03 ENCOUNTER — Encounter: Payer: Self-pay | Admitting: Internal Medicine

## 2013-08-03 VITALS — BP 132/72 | HR 78 | Temp 98.2°F | Ht 67.0 in | Wt 269.0 lb

## 2013-08-03 DIAGNOSIS — J019 Acute sinusitis, unspecified: Secondary | ICD-10-CM

## 2013-08-03 DIAGNOSIS — I10 Essential (primary) hypertension: Secondary | ICD-10-CM

## 2013-08-03 DIAGNOSIS — J309 Allergic rhinitis, unspecified: Secondary | ICD-10-CM

## 2013-08-03 MED ORDER — METHYLPREDNISOLONE ACETATE 80 MG/ML IJ SUSP
80.0000 mg | Freq: Once | INTRAMUSCULAR | Status: AC
  Administered 2013-08-03: 80 mg via INTRAMUSCULAR

## 2013-08-03 MED ORDER — HYDROCODONE-HOMATROPINE 5-1.5 MG/5ML PO SYRP: 5.0000 mL | ORAL_SOLUTION | Freq: Four times a day (QID) | ORAL | Status: DC | PRN

## 2013-08-03 MED ORDER — AZITHROMYCIN 250 MG PO TABS: ORAL_TABLET | ORAL | Status: DC

## 2013-08-03 MED ORDER — CETIRIZINE HCL 10 MG PO TABS: 10.0000 mg | ORAL_TABLET | Freq: Every day | ORAL | Status: DC

## 2013-08-03 MED ORDER — FLUTICASONE PROPIONATE 50 MCG/ACT NA SUSP: 2.0000 | Freq: Every day | NASAL | Status: DC

## 2013-08-03 NOTE — Progress Notes (Signed)
Subjective:    Patient ID: Kelsey Keller, female    DOB: 08/01/1940, 73 y.o.   MRN: [id number]  HPI Here to f/u, was better then worse again with prod cough and sinus congestion, has had allergies in the past, no asthma or worsening reflux, also with low grade fever as well, sputum greenish yellow.  Pt denies chest pain, increased sob or doe, wheezing, orthopnea, PND, increased LE swelling, palpitations, dizziness or syncope.  ; Pt denies fever, wt loss, night sweats, loss of appetite, or other constitutional symptoms other than above.  Husband was former smoker, died of ENT cancer 10 yrs ago. Pt is never smoker Past Medical History  Diagnosis Date  . HYPOTHYROIDISM 04/29/2007  . DIABETES MELLITUS, TYPE II 06/23/2007  . HYPERLIPIDEMIA 04/29/2007  . HYPOKALEMIA 05/02/2009  . HYPERTENSION 04/29/2007  . ALLERGIC RHINITIS 04/29/2007  . OSTEOARTHRITIS, KNEE, LEFT 06/23/2007  . SPINAL STENOSIS, LUMBAR 04/29/2007  . LOW BACK PAIN 04/29/2007  . TOE PAIN 02/20/2010  . OSTEOPOROSIS NOS 05/12/2007  . HIATAL HERNIA, HX OF 04/29/2007   Past Surgical History  Procedure Laterality Date  . Total knee arthroplasty  2006    Right  . Total knee arthroplasty  2006    right    reports that she has never smoked. She does not have any smokeless tobacco history on file. She reports that she drinks alcohol. She reports that she does not use illicit drugs. family history includes Coronary artery disease in her other; Diabetes in her other. No Known Allergies Current Outpatient Prescriptions on File Prior to Visit  Medication Sig Dispense Refill  . alendronate (FOSAMAX) 70 MG tablet Take 1 tablet (70 mg total) by mouth every 7 (seven) days. Take with a full glass of water on an empty stomach.  12 tablet  3  . aspirin 81 MG EC tablet Take 81 mg by mouth daily.        Marland Kitchen atenolol-chlorthalidone (TENORETIC) 50-25 MG per tablet Take 1 tablet by mouth daily.  90 tablet  3  . calcium carbonate (TUMS EX) 750 MG  chewable tablet Chew 1 tablet by mouth 2 (two) times daily.        Marland Kitchen glimepiride (AMARYL) 4 MG tablet Take 1 tablet (4 mg total) by mouth 2 (two) times daily.  180 tablet  3  . glucose blood test strip Use as directed two times a day, diagnosis code 250.00  200 each  3  . HYDROcodone-homatropine (HYCODAN) 5-1.5 MG/5ML syrup Take 5 mLs by mouth every 6 (six) hours as needed for cough.  180 mL  0  . Lancets MISC Apply 1 application topically 2 (two) times daily. Use as directed two times a day, diagnosis code 250.00  200 each  3  . levothyroxine (SYNTHROID, LEVOTHROID) 150 MCG tablet Take 1 tablet (150 mcg total) by mouth daily.  90 tablet  3  . meloxicam (MOBIC) 15 MG tablet Take 1 tablet (15 mg total) by mouth daily.  90 tablet  3  . metFORMIN (GLUCOPHAGE) 500 MG tablet 2 tabs by mouth in the AM, and 1 tab in the PM  270 tablet  3   No current facility-administered medications on file prior to visit.   Review of Systems  Constitutional: Negative for unexpected weight change, or unusual diaphoresis  HENT: Negative for tinnitus.   Eyes: Negative for photophobia and visual disturbance.  Respiratory: Negative for choking and stridor.   Gastrointestinal: Negative for vomiting and blood in stool.  Genitourinary: Negative  for hematuria and decreased urine volume.  Musculoskeletal: Negative for acute joint swelling Skin: Negative for color change and wound.  Neurological: Negative for tremors and numbness other than noted  Psychiatric/Behavioral: Negative for decreased concentration or  hyperactivity.       Objective:   Physical Exam BP 132/72  Pulse 78  Temp(Src) 98.2 F (36.8 C) (Oral)  Ht 5\' 7"  (1.702 m)  Wt 269 lb (122.018 kg)  BMI 42.12 kg/m2  SpO2 96% VS noted, mild ill Constitutional: Pt appears well-developed and well-nourished.  HENT: Head: NCAT.  Right Ear: External ear normal.  Left Ear: External ear normal.  Bilat tm's with mild erythema.  Max sinus areas non tender.   Pharynx with mild erythema, no exudate Eyes: Conjunctivae and EOM are normal. Pupils are equal, round, and reactive to light.  Neck: Normal range of motion. Neck supple.  Cardiovascular: Normal rate and regular rhythm.   Pulmonary/Chest: Effort normal and breath sounds without rales, rhonchi or wheezing.  Neurological: Pt is alert. Not confused  Skin: Skin is warm. No erythema.  Psychiatric: Pt behavior is normal. Thought content normal.     Assessment & Plan:

## 2013-08-03 NOTE — Assessment & Plan Note (Signed)
stable overall by history and exam, recent data reviewed with pt, and pt to continue medical treatment as before,  to f/u any worsening symptoms or concerns BP Readings from Last 3 Encounters:  08/03/13 132/72  07/21/13 128/70  04/15/13 110/64

## 2013-08-03 NOTE — Assessment & Plan Note (Signed)
Mild to mod, for depomedrol IM, zyrtec/flonase asd,  to f/u any worsening symptoms or concerns

## 2013-08-03 NOTE — Progress Notes (Signed)
Pre-visit discussion using our clinic review tool. No additional management support is needed unless otherwise documented below in the visit note.  

## 2013-08-03 NOTE — Assessment & Plan Note (Signed)
Mild to mod, for antibx course,  to f/u any worsening symptoms or concerns 

## 2013-08-03 NOTE — Patient Instructions (Addendum)
You had the steroid shot today Please take all new medication as prescribed - the antibiotic, cough mediciine, zyrtec and flonase as directed Please continue all other medications as before, and refills have been done if requested. Please have the pharmacy call with any other refills you may need.  Please remember to sign up for My Chart if you have not done so, as this will be important to you in the future with finding out test results, communicating by private email, and scheduling acute appointments online when needed.

## 2013-08-04 ENCOUNTER — Telehealth: Payer: Self-pay | Admitting: Internal Medicine

## 2013-08-04 NOTE — Telephone Encounter (Signed)
Relevant patient education assigned to patient using Emmi. ° °

## 2013-10-19 ENCOUNTER — Ambulatory Visit: Payer: Medicare Other | Admitting: Internal Medicine

## 2013-10-20 ENCOUNTER — Other Ambulatory Visit: Payer: Self-pay | Admitting: Internal Medicine

## 2013-10-20 DIAGNOSIS — Z1231 Encounter for screening mammogram for malignant neoplasm of breast: Secondary | ICD-10-CM

## 2013-11-30 ENCOUNTER — Telehealth: Payer: Self-pay | Admitting: Internal Medicine

## 2013-11-30 DIAGNOSIS — E1165 Type 2 diabetes mellitus with hyperglycemia: Secondary | ICD-10-CM

## 2013-11-30 DIAGNOSIS — IMO0001 Reserved for inherently not codable concepts without codable children: Secondary | ICD-10-CM

## 2013-11-30 DIAGNOSIS — Z0100 Encounter for examination of eyes and vision without abnormal findings: Secondary | ICD-10-CM

## 2013-11-30 NOTE — Telephone Encounter (Signed)
Done per emr 

## 2013-11-30 NOTE — Telephone Encounter (Signed)
Pt request referral to go to Dr. Heather [place] on [address] for diabetic eye check. Pt has [facility] that's why they require the referral from PCP. Please advise. Appt with Dr. [place] is 12/07/13.

## 2013-11-30 NOTE — Telephone Encounter (Signed)
Notified pt md has placed the referral.../lmb 

## 2013-12-02 ENCOUNTER — Ambulatory Visit (HOSPITAL_COMMUNITY)
Admission: RE | Admit: 2013-12-02 | Discharge: 2013-12-02 | Disposition: A | Discharge status: Home / Self Care | Payer: Medicare HMO | Source: Ambulatory Visit | Attending: Internal Medicine | Admitting: Internal Medicine

## 2013-12-02 DIAGNOSIS — Z1231 Encounter for screening mammogram for malignant neoplasm of breast: Secondary | ICD-10-CM | POA: Insufficient documentation

## 2013-12-07 ENCOUNTER — Other Ambulatory Visit: Payer: Self-pay | Admitting: Internal Medicine

## 2013-12-07 DIAGNOSIS — R928 Other abnormal and inconclusive findings on diagnostic imaging of breast: Secondary | ICD-10-CM

## 2013-12-07 LAB — HM DIABETES EYE EXAM

## 2013-12-07 LAB — HM MAMMOGRAPHY

## 2013-12-28 ENCOUNTER — Ambulatory Visit
Admission: RE | Admit: 2013-12-28 | Discharge: 2013-12-28 | Disposition: A | Discharge status: Home / Self Care | Payer: Commercial Managed Care - HMO | Source: Ambulatory Visit | Attending: Internal Medicine | Admitting: Internal Medicine

## 2013-12-28 DIAGNOSIS — R928 Other abnormal and inconclusive findings on diagnostic imaging of breast: Secondary | ICD-10-CM

## 2014-01-11 ENCOUNTER — Other Ambulatory Visit (INDEPENDENT_AMBULATORY_CARE_PROVIDER_SITE_OTHER): Payer: Commercial Managed Care - HMO

## 2014-01-11 DIAGNOSIS — E119 Type 2 diabetes mellitus without complications: Secondary | ICD-10-CM

## 2014-01-11 DIAGNOSIS — Z0000 Encounter for general adult medical examination without abnormal findings: Secondary | ICD-10-CM

## 2014-01-11 LAB — CBC WITH DIFFERENTIAL/PLATELET
BASOS PCT: 0.5 % (ref 0.0–3.0)
Basophils Absolute: 0 10*3/uL (ref 0.0–0.1)
Eosinophils Absolute: 0.3 10*3/uL (ref 0.0–0.7)
Eosinophils Relative: 5.6 % — ABNORMAL HIGH (ref 0.0–5.0)
HEMATOCRIT: 41.5 % (ref 36.0–46.0)
HEMOGLOBIN: 14.1 g/dL (ref 12.0–15.0)
LYMPHS ABS: 1.2 10*3/uL (ref 0.7–4.0)
Lymphocytes Relative: 23.7 % (ref 12.0–46.0)
MCHC: 34 g/dL (ref 30.0–36.0)
MCV: 94.1 fl (ref 78.0–100.0)
MONO ABS: 0.5 10*3/uL (ref 0.1–1.0)
MONOS PCT: 9.1 % (ref 3.0–12.0)
NEUTROS ABS: 3 10*3/uL (ref 1.4–7.7)
Neutrophils Relative %: 61.1 % (ref 43.0–77.0)
PLATELETS: 212 10*3/uL (ref 150.0–400.0)
RBC: 4.41 Mil/uL (ref 3.87–5.11)
RDW: 13.8 % (ref 11.5–15.5)
WBC: 5 10*3/uL (ref 4.0–10.5)

## 2014-01-11 LAB — LIPID PANEL
CHOL/HDL RATIO: 3
Cholesterol: 201 mg/dL — ABNORMAL HIGH (ref 0–200)
HDL: 61.8 mg/dL (ref >39.00)
LDL Cholesterol: 108 mg/dL — ABNORMAL HIGH (ref 0–99)
NONHDL: 139.2
Triglycerides: 158 mg/dL — ABNORMAL HIGH (ref 0.0–149.0)
VLDL: 31.6 mg/dL (ref 0.0–40.0)

## 2014-01-11 LAB — BASIC METABOLIC PANEL
BUN: 24 mg/dL — ABNORMAL HIGH (ref 6–23)
CALCIUM: 9.7 mg/dL (ref 8.4–10.5)
CHLORIDE: 103 meq/L (ref 96–112)
CO2: 27 mEq/L (ref 19–32)
CREATININE: 1 mg/dL (ref 0.4–1.2)
GFR: 57.39 mL/min — ABNORMAL LOW (ref >60.00)
Glucose, Bld: 203 mg/dL — ABNORMAL HIGH (ref 70–99)
Potassium: 4.3 mEq/L (ref 3.5–5.1)
Sodium: 138 mEq/L (ref 135–145)

## 2014-01-11 LAB — URINALYSIS, ROUTINE W REFLEX MICROSCOPIC
BILIRUBIN URINE: NEGATIVE
Hgb urine dipstick: NEGATIVE
KETONES UR: NEGATIVE
Leukocytes, UA: NEGATIVE
Nitrite: NEGATIVE
RBC / HPF: NONE SEEN (ref 0–?)
Specific Gravity, Urine: 1.02 (ref 1.000–1.030)
TOTAL PROTEIN, URINE-UPE24: NEGATIVE
Urine Glucose: NEGATIVE
Urobilinogen, UA: 0.2 (ref 0.0–1.0)
WBC, UA: NONE SEEN (ref 0–?)
pH: 6.5 (ref 5.0–8.0)

## 2014-01-11 LAB — MICROALBUMIN / CREATININE URINE RATIO
Creatinine,U: 167.5 mg/dL
MICROALB UR: 0.2 mg/dL (ref 0.0–1.9)
MICROALB/CREAT RATIO: 0.1 mg/g (ref 0.0–30.0)

## 2014-01-11 LAB — HEMOGLOBIN A1C: HEMOGLOBIN A1C: 8.4 % — AB (ref 4.6–6.5)

## 2014-01-11 LAB — HEPATIC FUNCTION PANEL
ALT: 86 U/L — ABNORMAL HIGH (ref 0–35)
AST: 79 U/L — AB (ref 0–37)
Albumin: 4 g/dL (ref 3.5–5.2)
Alkaline Phosphatase: 78 U/L (ref 39–117)
BILIRUBIN TOTAL: 0.8 mg/dL (ref 0.2–1.2)
Bilirubin, Direct: 0.2 mg/dL (ref 0.0–0.3)
Total Protein: 7.3 g/dL (ref 6.0–8.3)

## 2014-01-11 LAB — TSH: TSH: 3.58 u[IU]/mL (ref 0.35–4.50)

## 2014-01-18 ENCOUNTER — Ambulatory Visit: Payer: Medicare HMO | Admitting: Internal Medicine

## 2014-01-21 ENCOUNTER — Ambulatory Visit (INDEPENDENT_AMBULATORY_CARE_PROVIDER_SITE_OTHER): Payer: Commercial Managed Care - HMO | Admitting: Internal Medicine

## 2014-01-21 ENCOUNTER — Encounter: Payer: Self-pay | Admitting: Internal Medicine

## 2014-01-21 VITALS — BP 130/80 | HR 77 | Temp 97.7°F | Ht 66.0 in | Wt 273.0 lb

## 2014-01-21 DIAGNOSIS — E119 Type 2 diabetes mellitus without complications: Secondary | ICD-10-CM

## 2014-01-21 DIAGNOSIS — E785 Hyperlipidemia, unspecified: Secondary | ICD-10-CM

## 2014-01-21 DIAGNOSIS — Z0000 Encounter for general adult medical examination without abnormal findings: Secondary | ICD-10-CM

## 2014-01-21 DIAGNOSIS — L989 Disorder of the skin and subcutaneous tissue, unspecified: Secondary | ICD-10-CM

## 2014-01-21 MED ORDER — METFORMIN HCL 1000 MG PO TABS: 1000.0000 mg | ORAL_TABLET | Freq: Two times a day (BID) | ORAL | Status: DC

## 2014-01-21 MED ORDER — MELOXICAM 15 MG PO TABS: 15.0000 mg | ORAL_TABLET | Freq: Every day | ORAL | Status: DC

## 2014-01-21 MED ORDER — ALENDRONATE SODIUM 70 MG PO TABS: 70.0000 mg | ORAL_TABLET | ORAL | Status: DC

## 2014-01-21 MED ORDER — FLUTICASONE PROPIONATE 50 MCG/ACT NA SUSP: 2.0000 | Freq: Every day | NASAL | Status: DC

## 2014-01-21 MED ORDER — ATENOLOL-CHLORTHALIDONE 50-25 MG PO TABS: 1.0000 | ORAL_TABLET | Freq: Every day | ORAL | Status: DC

## 2014-01-21 MED ORDER — LEVOTHYROXINE SODIUM 150 MCG PO TABS: 150.0000 ug | ORAL_TABLET | Freq: Every day | ORAL | Status: DC

## 2014-01-21 MED ORDER — METFORMIN HCL 500 MG PO TABS: ORAL_TABLET | ORAL | Status: DC

## 2014-01-21 MED ORDER — GLIMEPIRIDE 4 MG PO TABS: 4.0000 mg | ORAL_TABLET | Freq: Two times a day (BID) | ORAL | Status: DC

## 2014-01-21 MED ORDER — CETIRIZINE HCL 10 MG PO TABS: 10.0000 mg | ORAL_TABLET | Freq: Every day | ORAL | Status: DC

## 2014-01-21 NOTE — Patient Instructions (Addendum)
OK to increase the metformin to 1000 mg twice per day  Please continue all other medications as before, and refills have been done if requested.  Please have the pharmacy call with any other refills you may need.  Please continue your efforts at being more active, low cholesterol diet, and weight control.  You are otherwise up to date with prevention measures today.  Please keep your appointments with your specialists as you may have planned  You will be contacted regarding the referral for: dermatology  Please return in 6 months, or sooner if needed, with Lab testing done 3-5 days before

## 2014-01-21 NOTE — Assessment & Plan Note (Signed)

## 2014-01-21 NOTE — Assessment & Plan Note (Signed)
Ok to incr the metformin to 1000 bid, .stable overall by history and exam, recent data reviewed with pt, and pt to continue medical treatment as before,  to f/u any worsening symptoms or concerns Lab Results  Component Value Date   HGBA1C 8.4* 01/11/2014

## 2014-01-21 NOTE — Assessment & Plan Note (Signed)
Declines statin, cont dm diet

## 2014-01-21 NOTE — Assessment & Plan Note (Signed)
Ok for derm referral 

## 2014-01-21 NOTE — Progress Notes (Signed)
Pre visit review using our clinic review tool, if applicable. No additional management support is needed unless otherwise documented below in the visit note. 

## 2014-01-21 NOTE — Progress Notes (Signed)
Subjective:    Patient ID: Kelsey Keller, female    DOB: [birth year], 73 y.o.   MRN: [id number]  HPI  Here for wellness and f/u;  Overall doing ok;  Pt denies CP, worsening SOB, DOE, wheezing, orthopnea, PND, worsening LE edema, palpitations, dizziness or syncope.  Pt denies neurological change such as new headache, facial or extremity weakness.  Pt denies polydipsia, polyuria, or low sugar symptoms. Pt states overall good compliance with treatment and medications, good tolerability, and has been trying to follow lower cholesterol diet.  Pt denies worsening depressive symptoms, suicidal ideation or panic. No fever, night sweats, wt loss, loss of appetite, or other constitutional symptoms.  Pt states good ability with ADL's, has low fall risk, home safety reviewed and adequate, no other significant changes in hearing or vision, and only occasionally active with exercise.  No current complaints.    As occas feet or lower leg leg carmps, left > right, mild but has to stand.  Peak wt has been 285 in past, had lost 40 lbs, developed LBP, has surgury sept 2014, and much less active than prior post op, had been walking up to 3 miles per day before surgury.  Does have recent onset nasal lesion below bridge of nose  Declines statin due to risk of leg weakness, and stil trying to get over the surgur. Past Medical History  Diagnosis Date  . HYPOTHYROIDISM 04/29/2007  . DIABETES MELLITUS, TYPE II 06/23/2007  . HYPERLIPIDEMIA 04/29/2007  . HYPOKALEMIA 05/02/2009  . HYPERTENSION 04/29/2007  . ALLERGIC RHINITIS 04/29/2007  . OSTEOARTHRITIS, KNEE, LEFT 06/23/2007  . SPINAL STENOSIS, LUMBAR 04/29/2007  . LOW BACK PAIN 04/29/2007  . TOE PAIN 02/20/2010  . OSTEOPOROSIS NOS 05/12/2007  . HIATAL HERNIA, HX OF 04/29/2007   Past Surgical History  Procedure Laterality Date  . Total knee arthroplasty  2006    Right  . Total knee arthroplasty  2006    right    reports that she has never smoked. She does not have  any smokeless tobacco history on file. She reports that she drinks alcohol. She reports that she does not use illicit drugs. family history includes Coronary artery disease in her other; Diabetes in her other. No Known Allergies Current Outpatient Prescriptions on File Prior to Visit  Medication Sig Dispense Refill  . aspirin 81 MG EC tablet Take 81 mg by mouth daily.        . calcium carbonate (TUMS EX) 750 MG chewable tablet Chew 1 tablet by mouth 2 (two) times daily.        Marland Kitchen glucose blood test strip Use as directed two times a day, diagnosis code 250.00  200 each  3  . Lancets MISC Apply 1 application topically 2 (two) times daily. Use as directed two times a day, diagnosis code 250.00  200 each  3   No current facility-administered medications on file prior to visit.   Review of Systems Constitutional: Negative for increased diaphoresis, other activity, appetite or other siginficant weight change  HENT: Negative for worsening hearing loss, ear pain, facial swelling, mouth sores and neck stiffness.   Eyes: Negative for other worsening pain, redness or visual disturbance.  Respiratory: Negative for shortness of breath and wheezing.   Cardiovascular: Negative for chest pain and palpitations.  Gastrointestinal: Negative for diarrhea, blood in stool, abdominal distention or other pain Genitourinary: Negative for hematuria, flank pain or change in urine volume.  Musculoskeletal: Negative for myalgias or other joint complaints.  Skin:  Negative for color change and wound.  Neurological: Negative for syncope and numbness. other than noted Hematological: Negative for adenopathy. or other swelling Psychiatric/Behavioral: Negative for hallucinations, self-injury, decreased concentration or other worsening agitation.      Objective:   Physical Exam BP 130/80  Pulse 77  Temp(Src) 97.7 F (36.5 C) (Oral)  Ht 5\' 6"  (1.676 m)  Wt 273 lb (123.832 kg)  BMI 44.08 kg/m2  SpO2 96% VS noted,    Constitutional: Pt is oriented to person, place, and time. Appears well-developed and well-nourished.  Head: Normocephalic and atraumatic.  Right Ear: External ear normal.  Left Ear: External ear normal.  Nose: Nose normal.  Mouth/Throat: Oropharynx is clear and moist.  Eyes: Conjunctivae and EOM are normal. Pupils are equal, round, and reactive to light.  Neck: Normal range of motion. Neck supple. No JVD present. No tracheal deviation present.  Cardiovascular: Normal rate, regular rhythm, normal heart sounds and intact distal pulses.   Pulmonary/Chest: Effort normal and breath sounds without rales or wheezing  Abdominal: Soft. Bowel sounds are normal. NT. No HSM  Musculoskeletal: Normal range of motion. Exhibits no edema.  Lymphadenopathy:  Has no cervical adenopathy.  Neurological: Pt is alert and oriented to person, place, and time. Pt has normal reflexes. No cranial nerve deficit. Motor grossly intact Skin: Skin is warm and dry. No rash noted. Has small 10 mm area rough erythema, nonscaly, nontender  Psychiatric:  Has normal mood and affect. Behavior is normal.     Assessment & Plan:

## 2014-02-03 ENCOUNTER — Encounter: Payer: Self-pay | Admitting: Internal Medicine

## 2014-02-03 ENCOUNTER — Telehealth: Payer: Self-pay | Admitting: *Deleted

## 2014-02-03 NOTE — Telephone Encounter (Signed)
Left msg on triage stating [facility] been trying to contact md for clarification on her metformin. Requesting md to contact them [phone]. Called [facility] wanted to make sure which dosage of metformin is taking. They received 500 mg & 1000 mg on the same day. Spoke with Kristen/pharmacist md increase med to 1000 mg twice a day. She stated they will contact and ship med out...Johny Chess

## 2014-02-28 ENCOUNTER — Telehealth: Payer: Self-pay | Admitting: Internal Medicine

## 2014-02-28 ENCOUNTER — Encounter: Payer: Self-pay | Admitting: Internal Medicine

## 2014-02-28 DIAGNOSIS — E669 Obesity, unspecified: Secondary | ICD-10-CM

## 2014-02-28 NOTE — Telephone Encounter (Signed)
Patient is calling to request a referral to participate in a weight loss program at [facility] with Dr. Leeroy Cha. Ph#: [phone]  She says she has already mad an appointment with this office for Friday, 8/21.

## 2014-03-01 NOTE — Telephone Encounter (Signed)
Patient informed. 

## 2014-03-01 NOTE — Telephone Encounter (Signed)
referral done  I am guessing the provider is a Education officer, environmental

## 2014-05-31 ENCOUNTER — Other Ambulatory Visit: Payer: Self-pay | Admitting: Internal Medicine

## 2014-06-24 ENCOUNTER — Other Ambulatory Visit: Payer: Self-pay | Admitting: Internal Medicine

## 2014-07-14 ENCOUNTER — Other Ambulatory Visit (INDEPENDENT_AMBULATORY_CARE_PROVIDER_SITE_OTHER): Payer: Commercial Managed Care - HMO

## 2014-07-14 DIAGNOSIS — E119 Type 2 diabetes mellitus without complications: Secondary | ICD-10-CM

## 2014-07-14 LAB — LIPID PANEL
Cholesterol: 164 mg/dL (ref 0–200)
HDL: 52.9 mg/dL (ref >39.00)
LDL CALC: 83 mg/dL (ref 0–99)
NONHDL: 111.1
Total CHOL/HDL Ratio: 3
Triglycerides: 139 mg/dL (ref 0.0–149.0)
VLDL: 27.8 mg/dL (ref 0.0–40.0)

## 2014-07-14 LAB — HEPATIC FUNCTION PANEL
ALK PHOS: 74 U/L (ref 39–117)
ALT: 64 U/L — AB (ref 0–35)
AST: 85 U/L — ABNORMAL HIGH (ref 0–37)
Albumin: 3.7 g/dL (ref 3.5–5.2)
BILIRUBIN DIRECT: 0.2 mg/dL (ref 0.0–0.3)
BILIRUBIN TOTAL: 0.7 mg/dL (ref 0.2–1.2)
Total Protein: 7 g/dL (ref 6.0–8.3)

## 2014-07-14 LAB — BASIC METABOLIC PANEL
BUN: 25 mg/dL — AB (ref 6–23)
CHLORIDE: 101 meq/L (ref 96–112)
CO2: 30 mEq/L (ref 19–32)
Calcium: 9.7 mg/dL (ref 8.4–10.5)
Creatinine, Ser: 1 mg/dL (ref 0.4–1.2)
GFR: 58.65 mL/min — ABNORMAL LOW (ref >60.00)
Glucose, Bld: 152 mg/dL — ABNORMAL HIGH (ref 70–99)
Potassium: 3.8 mEq/L (ref 3.5–5.1)
Sodium: 135 mEq/L (ref 135–145)

## 2014-07-14 LAB — HEMOGLOBIN A1C: Hgb A1c MFr Bld: 7.7 % — ABNORMAL HIGH (ref 4.6–6.5)

## 2014-07-26 ENCOUNTER — Encounter: Payer: Self-pay | Admitting: Internal Medicine

## 2014-07-26 ENCOUNTER — Ambulatory Visit (INDEPENDENT_AMBULATORY_CARE_PROVIDER_SITE_OTHER)
Admission: RE | Admit: 2014-07-26 | Discharge: 2014-07-26 | Disposition: A | Discharge status: Home / Self Care | Payer: Commercial Managed Care - HMO | Source: Ambulatory Visit | Attending: Internal Medicine | Admitting: Internal Medicine

## 2014-07-26 ENCOUNTER — Ambulatory Visit (INDEPENDENT_AMBULATORY_CARE_PROVIDER_SITE_OTHER): Payer: Commercial Managed Care - HMO | Admitting: Internal Medicine

## 2014-07-26 VITALS — BP 138/84 | HR 64 | Temp 98.2°F | Resp 16 | Ht 66.5 in | Wt 260.0 lb

## 2014-07-26 DIAGNOSIS — R05 Cough: Secondary | ICD-10-CM

## 2014-07-26 DIAGNOSIS — E119 Type 2 diabetes mellitus without complications: Secondary | ICD-10-CM

## 2014-07-26 DIAGNOSIS — I10 Essential (primary) hypertension: Secondary | ICD-10-CM

## 2014-07-26 DIAGNOSIS — E785 Hyperlipidemia, unspecified: Secondary | ICD-10-CM

## 2014-07-26 DIAGNOSIS — R059 Cough, unspecified: Secondary | ICD-10-CM | POA: Insufficient documentation

## 2014-07-26 DIAGNOSIS — Z0000 Encounter for general adult medical examination without abnormal findings: Secondary | ICD-10-CM

## 2014-07-26 DIAGNOSIS — Z0189 Encounter for other specified special examinations: Secondary | ICD-10-CM

## 2014-07-26 MED ORDER — HYDROCODONE-HOMATROPINE 5-1.5 MG/5ML PO SYRP: 5.0000 mL | ORAL_SOLUTION | Freq: Four times a day (QID) | ORAL | Status: DC | PRN

## 2014-07-26 MED ORDER — AZITHROMYCIN 250 MG PO TABS: ORAL_TABLET | ORAL | Status: DC

## 2014-07-26 NOTE — Assessment & Plan Note (Signed)
Mild improved but still mild elev, pt declines further oral med, want  To cont current meds, diet, exercise, with f/u lab next visit Lab Results  Component Value Date   HGBA1C 7.7* 07/14/2014

## 2014-07-26 NOTE — Progress Notes (Signed)
Pre visit review using our clinic review tool, if applicable. No additional management support is needed unless otherwise documented below in the visit note. 

## 2014-07-26 NOTE — Progress Notes (Signed)
Subjective:    Patient ID: Kelsey Keller, female    DOB: Jan 05, 1941, 74 y.o.   MRN: [id number]  HPI  Here with persistent cough, scan prod, since onset post xmas of uri/bronchitis like symptoms. Pt denies chest pain, increased sob or doe, wheezing, orthopnea, PND, increased LE swelling, palpitations, dizziness or syncope, exceot for right lateral side (lateral to RUQ) pain with cough only, sharp, nonraidating, worse with cough and lying on left side, and not better with mucinex.  Just back fron [place] to visist vamiloy, had a misstep going down stairs that jarred her as well. Has hx of allergies, some post nasal gtt.   Pt denies polydipsia, polyuria, or low sugar symptoms such as weakness or confusion improved with po intake.  Pt states overall good compliance with meds, trying to follow lower cholesterol, diabetic diet, wt overall stable but little exercise however.  A1c improved, does not want further meds, wants to work further on diet.  Has been rx contrave to help but too expensive and BP slightly increased, so stopped. Lab Results  Component Value Date   HGBA1C 7.7* 07/14/2014  Lost 13 lbs overall with better diet Wt Readings from Last 3 Encounters:  07/26/14 260 lb (117.935 kg)  01/21/14 273 lb (123.832 kg)  08/03/13 269 lb (122.018 kg)   Past Medical History  Diagnosis Date  . HYPOTHYROIDISM 04/29/2007  . DIABETES MELLITUS, TYPE II 06/23/2007  . HYPERLIPIDEMIA 04/29/2007  . HYPOKALEMIA 05/02/2009  . HYPERTENSION 04/29/2007  . ALLERGIC RHINITIS 04/29/2007  . OSTEOARTHRITIS, KNEE, LEFT 06/23/2007  . SPINAL STENOSIS, LUMBAR 04/29/2007  . LOW BACK PAIN 04/29/2007  . TOE PAIN 02/20/2010  . OSTEOPOROSIS NOS 05/12/2007  . HIATAL HERNIA, HX OF 04/29/2007   Past Surgical History  Procedure Laterality Date  . Total knee arthroplasty  2006    Right  . Total knee arthroplasty  2006    right    reports that she has never smoked. She does not have any smokeless tobacco history on file. She  reports that she drinks alcohol. She reports that she does not use illicit drugs. family history includes Coronary artery disease in her other; Diabetes in her other. No Known Allergies Current Outpatient Prescriptions on File Prior to Visit  Medication Sig Dispense Refill  . alendronate (FOSAMAX) 70 MG tablet TAKE 1 TABLET EVERY 7 DAYS. TAKE WITH A FULL GLASS OF WATER ON AN EMPTY STOMACH. 12 tablet 3  . aspirin 81 MG EC tablet Take 81 mg by mouth daily.      Marland Kitchen atenolol-chlorthalidone (TENORETIC) 50-25 MG per tablet TAKE 1 TABLET EVERY DAY 90 tablet 1  . calcium carbonate (TUMS EX) 750 MG chewable tablet Chew 1 tablet by mouth 2 (two) times daily.      . cetirizine (ZYRTEC) 10 MG tablet Take 1 tablet (10 mg total) by mouth daily. 90 tablet 3  . fluticasone (FLONASE) 50 MCG/ACT nasal spray Place 2 sprays into both nostrils daily. 48 g 3  . glimepiride (AMARYL) 4 MG tablet Take 1 tablet (4 mg total) by mouth 2 (two) times daily. 180 tablet 3  . glucose blood test strip Use as directed two times a day, diagnosis code 250.00 200 each 3  . Lancets MISC Apply 1 application topically 2 (two) times daily. Use as directed two times a day, diagnosis code 250.00 200 each 3  . levothyroxine (SYNTHROID, LEVOTHROID) 150 MCG tablet TAKE 1 TABLET EVERY DAY 90 tablet 1  . meloxicam (MOBIC) 15 MG tablet  TAKE 1 TABLET EVERY DAY 90 tablet 1  . metFORMIN (GLUCOPHAGE) 1000 MG tablet Take 1 tablet (1,000 mg total) by mouth 2 (two) times daily with a meal. 180 tablet 3   No current facility-administered medications on file prior to visit.   Review of Systems  Constitutional: Negative for unusual diaphoresis or other sweats  HENT: Negative for ringing in ear Eyes: Negative for double vision or worsening visual disturbance.  Respiratory: Negative for choking and stridor.   Gastrointestinal: Negative for vomiting or other signifcant bowel change Genitourinary: Negative for hematuria or decreased urine volume.    Musculoskeletal: Negative for other MSK pain or swelling Skin: Negative for color change and worsening wound.  Neurological: Negative for tremors and numbness other than noted  Psychiatric/Behavioral: Negative for decreased concentration or agitation other than above       Objective:   Physical Exam BP 138/84 mmHg  Pulse 64  Temp(Src) 98.2 F (36.8 C) (Oral)  Resp 16  Ht 5' 6.5" (1.689 m)  Wt 260 lb (117.935 kg)  BMI 41.34 kg/m2  SpO2 98% VS noted, mild ill Constitutional: Pt appears well-developed, well-nourished.  HENT: Head: NCAT.  Right Ear: External ear normal.  Left Ear: External ear normal.  Bilat tm's with mild erythema.  Max sinus areas mild tender.  Pharynx with mild erythema, no exudate Eyes: . Pupils are equal, round, and reactive to light. Conjunctivae and EOM are normal Neck: Normal range of motion. Neck supple.  Cardiovascular: Normal rate and regular rhythm.   Pulmonary/Chest: Effort normal and breath sounds without rales or wheezing, but decreased somewhat bilat  Abd:  Soft, NT, ND, + BS Neurological: Pt is alert. Not confused , motor grossly intact Skin: Skin is warm. No rash Psychiatric: Pt behavior is normal. No agitation.      Assessment & Plan:

## 2014-07-26 NOTE — Patient Instructions (Signed)
Please take all new medication as prescribed - the antibiotic, and cough medicine  Please continue all other medications as before, and refills have been done if requested.  Please have the pharmacy call with any other refills you may need.  Please continue your efforts at being more active, low cholesterol diabetic diet, and weight control.  Please keep your appointments with your specialists as you may have planned  Please go to the XRAY Department in the Basement (go straight as you get off the elevator) for the x-ray testing  You will be contacted by phone if any changes need to be made immediately.  Otherwise, you will receive a letter about your results with an explanation, but please check with MyChart first.  Please remember to sign up for MyChart if you have not done so, as this will be important to you in the future with finding out test results, communicating by private email, and scheduling acute appointments online when needed.  Please return in 6 months, or sooner if needed, with Lab testing done 3-5 days before

## 2014-07-26 NOTE — Assessment & Plan Note (Signed)
stable overall by history and exam, recent data reviewed with pt, and pt to continue medical treatment as before,  to f/u any worsening symptoms or concerns BP Readings from Last 3 Encounters:  07/26/14 138/84  01/21/14 130/80  08/03/13 132/72

## 2014-07-26 NOTE — Assessment & Plan Note (Signed)
stable overall by history and exam, recent data reviewed with pt, and pt to continue medical treatment as before,  to f/u any worsening symptoms or concerns Lab Results  Component Value Date   LDLCALC 83 07/14/2014

## 2014-07-26 NOTE — Assessment & Plan Note (Signed)
C/w bronchitic type but with CP on the right will ask for cxr r/o pna, Mild to mod, for antibx course,  to f/u any worsening symptoms or concerns

## 2014-09-05 ENCOUNTER — Telehealth: Payer: Self-pay | Admitting: Internal Medicine

## 2014-09-05 NOTE — Telephone Encounter (Signed)
Is requesting silverback referral to Dr. Glenna Fellows.  Appt scheduled 3/15 at 9:30am.  Patient states she is a Lumbar lordosis patient.  She was not sure of spelling.

## 2014-09-06 NOTE — Telephone Encounter (Signed)
Silverback auth submitted. Awaiting approval. 

## 2014-09-10 ENCOUNTER — Encounter: Payer: Self-pay | Admitting: Internal Medicine

## 2014-09-10 DIAGNOSIS — M545 Low back pain: Secondary | ICD-10-CM

## 2014-09-12 NOTE — Telephone Encounter (Signed)
Silverback Kelsey Keller #[id number] valid 09/27/2014 - 03/26/2015 for 6 visits

## 2014-10-19 ENCOUNTER — Ambulatory Visit (INDEPENDENT_AMBULATORY_CARE_PROVIDER_SITE_OTHER): Payer: Commercial Managed Care - HMO

## 2014-10-19 VITALS — BP 144/84 | HR 69 | Temp 97.7°F | Ht 65.0 in | Wt 270.5 lb

## 2014-10-19 DIAGNOSIS — Z0000 Encounter for general adult medical examination without abnormal findings: Secondary | ICD-10-CM | POA: Diagnosis not present

## 2014-10-19 NOTE — Patient Instructions (Signed)
Kelsey Keller , Thank you for taking time to come for your Medicare Wellness Visit. I appreciate your ongoing commitment to your health goals. Please review the following plan we discussed and let me know if I can assist you in the future.   We reviewed risk and the patient plans to work on exercise and increasing from 120 a week to 190 minutes a week; Plans to try recumbent bike Likes to be out in the yard. Safety reviewed; one fall in garden pulling weeds; no injury Given information regarding advanced directives  Diet; lean meat; fruits; vegetables Dental; once time q year Diabetic Foot exam; Will discuss with MD Vision: once a year;   [facility] Dr. Heather [place] - back doctor  Glenna Fellows neurosugeon  Advance Directive Advance directives are the legal documents that allow you to make choices about your health care and medical treatment if you cannot speak for yourself. Advance directives are a way for you to communicate your wishes to family, friends, and health care providers. The specified people can then convey your decisions about end-of-life care to avoid confusion if you should become unable to communicate. Ideally, the process of discussing and writing advance directives should happen over time rather than making decisions all at once. Advance directives can be modified as your situation changes, and you can change your mind at any time, even after you have signed the advance directives. Each state has its own laws regarding advance directives. You may want to check with your health care provider, attorney, or state representative about the law in your state. Below are some examples of advance directives. LIVING WILL A living will is a set of instructions documenting your wishes about medical care when you cannot care for yourself. It is used if you become:  Terminally ill.  Incapacitated.  Unable to communicate.  Unable to make decisions. Items to consider in your living will  include:  The use or non-use of life-sustaining equipment, such as dialysis machines and breathing machines (ventilators).  A do not resuscitate (DNR) order, which is the instruction not to use cardiopulmonary resuscitation (CPR) if breathing or heartbeat stops.  Tube feeding.  Withholding of food and fluids.  Comfort (palliative) care when the goal becomes comfort rather than a cure.  Organ and tissue donation. A living will does not give instructions about distribution of your money and property if you should pass away. It is advisable to seek the expert advice of a lawyer in drawing up a will regarding your possessions. Decisions about taxes, beneficiaries, and asset distribution will be legally binding. This process can relieve your family and friends of any burdens surrounding disputes or questions that may come up about the allocation of your assets. DO NOT RESUSCITATE (DNR) A do not resuscitate (DNR) order is a request to not have CPR in the event that your heart stops beating or you stop breathing. Unless given other instructions, a health care provider will try to help any patient whose heart has stopped or who has stopped breathing.  HEALTH CARE PROXY AND DURABLE POWER OF ATTORNEY FOR HEALTH CARE A health care proxy is a person (agent) appointed to make medical decisions for you if you cannot. Generally, people choose someone they know well and trust to represent their preferences when they can no longer do so. You should be sure to ask this person for agreement to act as your agent. An agent may have to exercise judgment in the event of a medical decision for  which your wishes are not known. The durable power of attorney for health care is the legal document that names your health care proxy. Once written, it should be:  Signed.  Notarized.  Dated.  Copied.  Witnessed.  Incorporated into your medical record. You may also want to appoint someone to manage your financial  affairs if you cannot. This is called a durable power of attorney for finances. It is a separate legal document from the durable power of attorney for health care. You may choose the same person or someone different from your health care proxy to act as your agent in financial matters. Document Released: 10/08/2007 Document Revised: 07/06/2013 Document Reviewed: 11/18/2012 [facility] Patient Information 2015 [place], [place]. This information is not intended to replace advice given to you by your health care provider. Make sure you discuss any questions you have with your health care provider.  These are the goals we discussed: Goals    . Exercise 150 minutes per week (moderate activity)     Exercise plan bicycle and upper body strength; Accomplished 30 min  Likes work around the house Goal is to assist with back pain/ DDD      . Weight < 200 lb (90.719 kg)     Is using myfitnesspal to document and plans to start using a recumbant bike;        This is a list of the screening recommended for you and due dates:  Health Maintenance  Topic Date Due  . Flu Shot  02/13/2016*  . Eye exam for diabetics  12/08/2014  . Hemoglobin A1C  01/12/2015  . Urine Protein Check  01/12/2015  . Complete foot exam   01/22/2015  . Mammogram  12/29/2015  . Colon Cancer Screening  04/14/2016  . Tetanus Vaccine  10/26/2018  . DEXA scan (bone density measurement)  Completed  . Shingles Vaccine  Completed  . Pneumonia vaccines  Completed  *Topic was postponed. The date shown is not the original due date.

## 2014-10-19 NOTE — Progress Notes (Signed)
Subjective:   Kelsey Keller is a 74 y.o. female who presents for Medicare Annual (Subsequent) preventive examination.   Cardiac Risk Factors include: advanced age (>66men, >39 women);diabetes mellitus;hypertension;obesity (BMI >30kg/m2) Reviewed with the patient; A1c is improving; using my fitness pal; exercising    Objective:     Vitals: BP 144/84 mmHg  Pulse 69  Temp(Src) 97.7 F (36.5 C) (Oral)  Ht 5\' 5"  (1.651 m)  Wt 270 lb 8 oz (122.698 kg)  BMI 45.01 kg/m2  SpO2 96%  Tobacco History  Smoking status  . Never Smoker   Smokeless tobacco  . Not on file     Counseling given: Yes   Past Medical History  Diagnosis Date  . HYPOTHYROIDISM 04/29/2007  . DIABETES MELLITUS, TYPE II 06/23/2007  . HYPERLIPIDEMIA 04/29/2007  . HYPOKALEMIA 05/02/2009  . HYPERTENSION 04/29/2007  . ALLERGIC RHINITIS 04/29/2007  . OSTEOARTHRITIS, KNEE, LEFT 06/23/2007  . SPINAL STENOSIS, LUMBAR 04/29/2007  . LOW BACK PAIN 04/29/2007  . TOE PAIN 02/20/2010  . OSTEOPOROSIS NOS 05/12/2007  . HIATAL HERNIA, HX OF 04/29/2007   Past Surgical History  Procedure Laterality Date  . Total knee arthroplasty  2006    Right  . Total knee arthroplasty  2006    right  . Cataract extraction w/ intraocular lens  implant, bilateral Bilateral 05/2011    doing well  . Back surgery N/A 2014    Lower back? not sure of procedure, will get medical records for practice   Family History  Problem Relation Age of Onset  . Coronary artery disease Other     Female 1st degree relative  . Diabetes Other     1st degree relative  . Arthritis Mother   . Hypertension Mother   . Coronary artery disease Father   . Arthritis Brother   . Stroke Brother   . Hyperlipidemia Brother   . COPD Brother    History  Sexual Activity  . Sexual Activity: Not on file    Outpatient Encounter Prescriptions as of 10/19/2014  Medication Sig  . alendronate (FOSAMAX) 70 MG tablet TAKE 1 TABLET EVERY 7 DAYS. TAKE WITH A FULL  GLASS OF WATER ON AN EMPTY STOMACH.  Marland Kitchen aspirin 81 MG EC tablet Take 81 mg by mouth daily.    Marland Kitchen atenolol-chlorthalidone (TENORETIC) 50-25 MG per tablet TAKE 1 TABLET EVERY DAY  . calcium carbonate (TUMS EX) 750 MG chewable tablet Chew 1 tablet by mouth 2 (two) times daily.    . cetirizine (ZYRTEC) 10 MG tablet Take 1 tablet (10 mg total) by mouth daily.  Marland Kitchen co-enzyme Q-10 30 MG capsule Take 100 mg by mouth daily.  . fluticasone (FLONASE) 50 MCG/ACT nasal spray Place 2 sprays into both nostrils daily.  Marland Kitchen glimepiride (AMARYL) 4 MG tablet Take 1 tablet (4 mg total) by mouth 2 (two) times daily.  Marland Kitchen glucose blood test strip Use as directed two times a day, diagnosis code 250.00  . Lancets MISC Apply 1 application topically 2 (two) times daily. Use as directed two times a day, diagnosis code 250.00  . levothyroxine (SYNTHROID, LEVOTHROID) 150 MCG tablet TAKE 1 TABLET EVERY DAY  . meloxicam (MOBIC) 15 MG tablet TAKE 1 TABLET EVERY DAY  . metFORMIN (GLUCOPHAGE) 1000 MG tablet Take 1 tablet (1,000 mg total) by mouth 2 (two) times daily with a meal.  . Red Yeast Rice 600 MG CAPS Take 600 mg by mouth 2 (two) times daily.  . [DISCONTINUED] azithromycin (ZITHROMAX Z-PAK) 250 MG  tablet Use as directed  . [DISCONTINUED] HYDROcodone-homatropine (HYCODAN) 5-1.5 MG/5ML syrup Take 5 mLs by mouth every 6 (six) hours as needed for cough.    Activities of Daily Living In your present state of health, do you have any difficulty performing the following activities: 10/19/2014  Is the patient deaf or have difficulty hearing? N  Hearing N  Vision N  Difficulty concentrating or making decisions N  Walking or climbing stairs? N  Doing errands, shopping? N  Preparing Food and eating ? N  Using the Toilet? N  In the past six months, have you accidently leaked urine? Y  Do you have problems with loss of bowel control? N  Managing your Medications? N  Managing your Finances? N  Housekeeping or managing your  Housekeeping? N    Patient Care Team: Biagio Borg, MD as PCP - General    Assessment:     Exercise Activities and Dietary recommendations Current Exercise Habits:: Home exercise routine, Type of exercise: Other - see comments, Time (Minutes): 30, Frequency (Times/Week): 3, Weekly Exercise (Minutes/Week): 90  Goals    . Exercise 150 minutes per week (moderate activity)     Exercise plan bicycle and upper body strength; Accomplished 30 min  Likes work around the house Goal is to assist with back pain/ DDD      . Weight < 200 lb (90.719 kg)     Is using myfitnesspal to document and plans to start using a recumbant bike;       Fall Risk Fall Risk  10/19/2014 01/21/2014 04/15/2013  Falls in the past year? Yes Yes Yes  Number falls in past yr: 1 1 1   Injury with Fall? No No No  Risk for fall due to : Other (Comment) - -   Depression Screen PHQ 2/9 Scores 10/19/2014 01/21/2014 04/15/2013  PHQ - 2 Score 0 0 0     Cognitive Testing MMSE - Mini Mental State Exam 10/19/2014  Orientation to time 5  Orientation to Place 5  Registration 3  Attention/ Calculation 3  Language- name 2 objects 2  Language- repeat 1  Language- follow 3 step command 3  Language- read & follow direction 1  Write a sentence 1  Copy design 1    Immunization History  Administered Date(s) Administered  . Pneumococcal Conjugate-13 04/15/2013  . Pneumococcal Polysaccharide-23 04/27/2008  . Td 10/25/2008  . Zoster 10/20/2007   Screening Tests Health Maintenance  Topic Date Due  . INFLUENZA VACCINE  02/13/2016 (Originally 02/13/2015)  . OPHTHALMOLOGY EXAM  12/08/2014  . HEMOGLOBIN A1C  01/12/2015  . URINE MICROALBUMIN  01/12/2015  . FOOT EXAM  01/22/2015  . MAMMOGRAM  12/29/2015  . COLONOSCOPY  04/14/2016  . TETANUS/TDAP  10/26/2018  . DEXA SCAN  Completed  . ZOSTAVAX  Completed  . PNA vac Low Risk Adult  Completed      Plan:   During the course of the visit the patient was educated and counseled  about the following appropriate screening and preventive services:   Vaccines to include Pneumoccal, Influenza, Hepatitis B, Td, Zostavax, HCV patient is up to date  Electrocardiogram- deferred  Cardiovascular Disease - risk reviewed; plan discussed  Colorectal cancer screening -colonoscopy not due  Bone density screening; reviewed osteopenia  Diabetes screening YES  Glaucoma screening ; yes  Mammography/PAP / no  Nutrition counseling   Patient Instructions (the written plan) was given to the patient.   Wynetta Fines, RN  10/19/2014  Subjective:   Kelsey Keller is a 74 y.o. female who presents for Medicare Annual (Subsequent) preventive examination.    Cardiac Risk Factors include: advanced age (>1men, >72 women);diabetes mellitus;hypertension;obesity (BMI >30kg/m2)     Objective:     Vitals: BP 144/84 mmHg  Pulse 69  Temp(Src) 97.7 F (36.5 C) (Oral)  Ht 5\' 5"  (1.651 m)  Wt 270 lb 8 oz (122.698 kg)  BMI 45.01 kg/m2  SpO2 96%  Tobacco History  Smoking status  . Never Smoker   Smokeless tobacco  . Not on file     Counseling given: Yes   Past Medical History  Diagnosis Date  . HYPOTHYROIDISM 04/29/2007  . DIABETES MELLITUS, TYPE II 06/23/2007  . HYPERLIPIDEMIA 04/29/2007  . HYPOKALEMIA 05/02/2009  . HYPERTENSION 04/29/2007  . ALLERGIC RHINITIS 04/29/2007  . OSTEOARTHRITIS, KNEE, LEFT 06/23/2007  . SPINAL STENOSIS, LUMBAR 04/29/2007  . LOW BACK PAIN 04/29/2007  . TOE PAIN 02/20/2010  . OSTEOPOROSIS NOS 05/12/2007  . HIATAL HERNIA, HX OF 04/29/2007   Past Surgical History  Procedure Laterality Date  . Total knee arthroplasty  2006    Right  . Total knee arthroplasty  2006    right  . Cataract extraction w/ intraocular lens  implant, bilateral Bilateral 05/2011    doing well  . Back surgery N/A 2014    Lower back? not sure of procedure, will get medical records for practice   Family History  Problem Relation Age of Onset  . Coronary  artery disease Other     Female 1st degree relative  . Diabetes Other     1st degree relative  . Arthritis Mother   . Hypertension Mother   . Coronary artery disease Father   . Arthritis Brother   . Stroke Brother   . Hyperlipidemia Brother   . COPD Brother    History  Sexual Activity  . Sexual Activity: Not on file    Outpatient Encounter Prescriptions as of 10/19/2014  Medication Sig  . alendronate (FOSAMAX) 70 MG tablet TAKE 1 TABLET EVERY 7 DAYS. TAKE WITH A FULL GLASS OF WATER ON AN EMPTY STOMACH.  Marland Kitchen aspirin 81 MG EC tablet Take 81 mg by mouth daily.    Marland Kitchen atenolol-chlorthalidone (TENORETIC) 50-25 MG per tablet TAKE 1 TABLET EVERY DAY  . calcium carbonate (TUMS EX) 750 MG chewable tablet Chew 1 tablet by mouth 2 (two) times daily.    . cetirizine (ZYRTEC) 10 MG tablet Take 1 tablet (10 mg total) by mouth daily.  Marland Kitchen co-enzyme Q-10 30 MG capsule Take 100 mg by mouth daily.  . fluticasone (FLONASE) 50 MCG/ACT nasal spray Place 2 sprays into both nostrils daily.  Marland Kitchen glimepiride (AMARYL) 4 MG tablet Take 1 tablet (4 mg total) by mouth 2 (two) times daily.  Marland Kitchen glucose blood test strip Use as directed two times a day, diagnosis code 250.00  . Lancets MISC Apply 1 application topically 2 (two) times daily. Use as directed two times a day, diagnosis code 250.00  . levothyroxine (SYNTHROID, LEVOTHROID) 150 MCG tablet TAKE 1 TABLET EVERY DAY  . meloxicam (MOBIC) 15 MG tablet TAKE 1 TABLET EVERY DAY  . metFORMIN (GLUCOPHAGE) 1000 MG tablet Take 1 tablet (1,000 mg total) by mouth 2 (two) times daily with a meal.  . Red Yeast Rice 600 MG CAPS Take 600 mg by mouth 2 (two) times daily.  . [DISCONTINUED] azithromycin (ZITHROMAX Z-PAK) 250 MG tablet Use as directed  . [DISCONTINUED] HYDROcodone-homatropine (HYCODAN) 5-1.5 MG/5ML syrup  Take 5 mLs by mouth every 6 (six) hours as needed for cough.    Activities of Daily Living In your present state of health, do you have any difficulty performing the  following activities: 10/19/2014  Hearing? N  Vision? N  Difficulty concentrating or making decisions? N  Walking or climbing stairs? N  Dressing or bathing? N  Doing errands, shopping? N  Preparing Food and eating ? N  Using the Toilet? N  In the past six months, have you accidently leaked urine? Y  Do you have problems with loss of bowel control? N  Managing your Medications? N  Managing your Finances? N  Housekeeping or managing your Housekeeping? N    Patient Care Team: Biagio Borg, MD as PCP - General    Assessment:     Exercise Activities and Dietary recommendations Current Exercise Habits:: Home exercise routine, Type of exercise: Other - see comments, Time (Minutes): 30, Frequency (Times/Week): 3, Weekly Exercise (Minutes/Week): 90  Goals    . Exercise 150 minutes per week (moderate activity)     Exercise plan bicycle and upper body strength; Accomplished 30 min  Likes work around the house Goal is to assist with back pain/ DDD      . Weight < 200 lb (90.719 kg)     Is using myfitnesspal to document and plans to start using a recumbant bike;       Fall Risk Fall Risk  10/19/2014 01/21/2014 04/15/2013  Falls in the past year? Yes Yes Yes  Number falls in past yr: 1 1 1   Injury with Fall? No No No  Risk for fall due to : Other (Comment) - -   Depression Screen PHQ 2/9 Scores 10/19/2014 01/21/2014 04/15/2013  PHQ - 2 Score 0 0 0     Cognitive Testing MMSE - Mini Mental State Exam 10/19/2014  Orientation to time 5  Orientation to Place 5  Registration 3  Attention/ Calculation 3  Language- name 2 objects 2  Language- repeat 1  Language- follow 3 step command 3  Language- read & follow direction 1  Write a sentence 1  Copy design 1    Immunization History  Administered Date(s) Administered  . Pneumococcal Conjugate-13 04/15/2013  . Pneumococcal Polysaccharide-23 04/27/2008  . Td 10/25/2008  . Zoster 10/20/2007   Screening Tests Health Maintenance  Topic  Date Due  . INFLUENZA VACCINE  02/13/2016 (Originally 02/13/2015)  . OPHTHALMOLOGY EXAM  12/08/2014  . HEMOGLOBIN A1C  01/12/2015  . URINE MICROALBUMIN  01/12/2015  . FOOT EXAM  01/22/2015  . MAMMOGRAM  12/29/2015  . COLONOSCOPY  04/14/2016  . TETANUS/TDAP  10/26/2018  . DEXA SCAN  Completed  . ZOSTAVAX  Completed  . PNA vac Low Risk Adult  Completed      Plan:    During the course of the visit the patient was educated and counseled about the following appropriate screening and preventive services:   Vaccines to include Pneumoccal, Influenza, Hepatitis B, Td, Zostavax, HCV  Electrocardiogram  Cardiovascular Disease  Colorectal cancer screening  Bone density screening  Diabetes screening  Glaucoma screening  Mammography/PAP  Nutrition counseling   Patient Instructions (the written plan) was given to the patient.   Wynetta Fines, RN  10/26/2014

## 2014-10-21 NOTE — Progress Notes (Deleted)
Subjective:   Kelsey Keller is a 74 y.o. female who presents for Medicare Annual (Subsequent) preventive examination.  Review of Systems:   Cardiac Risk Factors include: advanced age (>53men, >100 women);diabetes mellitus;hypertension;obesity (BMI >30kg/m2)     Objective:     Vitals: BP 144/84 mmHg  Pulse 69  Temp(Src) 97.7 F (36.5 C) (Oral)  Ht 5\' 5"  (1.651 m)  Wt 270 lb 8 oz (122.698 kg)  BMI 45.01 kg/m2  SpO2 96%  Tobacco History  Smoking status  . Never Smoker   Smokeless tobacco  . Not on file     Counseling given: Yes   Past Medical History  Diagnosis Date  . HYPOTHYROIDISM 04/29/2007  . DIABETES MELLITUS, TYPE II 06/23/2007  . HYPERLIPIDEMIA 04/29/2007  . HYPOKALEMIA 05/02/2009  . HYPERTENSION 04/29/2007  . ALLERGIC RHINITIS 04/29/2007  . OSTEOARTHRITIS, KNEE, LEFT 06/23/2007  . SPINAL STENOSIS, LUMBAR 04/29/2007  . LOW BACK PAIN 04/29/2007  . TOE PAIN 02/20/2010  . OSTEOPOROSIS NOS 05/12/2007  . HIATAL HERNIA, HX OF 04/29/2007   Past Surgical History  Procedure Laterality Date  . Total knee arthroplasty  2006    Right  . Total knee arthroplasty  2006    right  . Cataract extraction w/ intraocular lens  implant, bilateral Bilateral 05/2011    doing well  . Back surgery N/A 2014    Lower back? not sure of procedure, will get medical records for practice   Family History  Problem Relation Age of Onset  . Coronary artery disease Other     Female 1st degree relative  . Diabetes Other     1st degree relative  . Arthritis Mother   . Hypertension Mother   . Coronary artery disease Father   . Arthritis Brother   . Stroke Brother   . Hyperlipidemia Brother   . COPD Brother    History  Sexual Activity  . Sexual Activity: Not on file    Outpatient Encounter Prescriptions as of 10/19/2014  Medication Sig  . alendronate (FOSAMAX) 70 MG tablet TAKE 1 TABLET EVERY 7 DAYS. TAKE WITH A FULL GLASS OF WATER ON AN EMPTY STOMACH.  Marland Kitchen aspirin 81 MG EC  tablet Take 81 mg by mouth daily.    Marland Kitchen atenolol-chlorthalidone (TENORETIC) 50-25 MG per tablet TAKE 1 TABLET EVERY DAY  . calcium carbonate (TUMS EX) 750 MG chewable tablet Chew 1 tablet by mouth 2 (two) times daily.    . cetirizine (ZYRTEC) 10 MG tablet Take 1 tablet (10 mg total) by mouth daily.  Marland Kitchen co-enzyme Q-10 30 MG capsule Take 100 mg by mouth daily.  . fluticasone (FLONASE) 50 MCG/ACT nasal spray Place 2 sprays into both nostrils daily.  Marland Kitchen glimepiride (AMARYL) 4 MG tablet Take 1 tablet (4 mg total) by mouth 2 (two) times daily.  Marland Kitchen glucose blood test strip Use as directed two times a day, diagnosis code 250.00  . Lancets MISC Apply 1 application topically 2 (two) times daily. Use as directed two times a day, diagnosis code 250.00  . levothyroxine (SYNTHROID, LEVOTHROID) 150 MCG tablet TAKE 1 TABLET EVERY DAY  . meloxicam (MOBIC) 15 MG tablet TAKE 1 TABLET EVERY DAY  . metFORMIN (GLUCOPHAGE) 1000 MG tablet Take 1 tablet (1,000 mg total) by mouth 2 (two) times daily with a meal.  . Red Yeast Rice 600 MG CAPS Take 600 mg by mouth 2 (two) times daily.  . [DISCONTINUED] azithromycin (ZITHROMAX Z-PAK) 250 MG tablet Use as directed  . [DISCONTINUED]  HYDROcodone-homatropine (HYCODAN) 5-1.5 MG/5ML syrup Take 5 mLs by mouth every 6 (six) hours as needed for cough.    Activities of Daily Living In your present state of health, do you have any difficulty performing the following activities: 10/19/2014  Hearing? N  Vision? N  Difficulty concentrating or making decisions? N  Walking or climbing stairs? N  Dressing or bathing? N  Doing errands, shopping? N  Preparing Food and eating ? N  Using the Toilet? N  In the past six months, have you accidently leaked urine? Y  Do you have problems with loss of bowel control? N  Managing your Medications? N  Managing your Finances? N  Housekeeping or managing your Housekeeping? N    Patient Care Team: Biagio Borg, MD as PCP - General    Assessment:       Exercise Activities and Dietary recommendations Current Exercise Habits:: Home exercise routine, Type of exercise: Other - see comments, Time (Minutes): 30, Frequency (Times/Week): 3, Weekly Exercise (Minutes/Week): 90  Goals    . Exercise 150 minutes per week (moderate activity)     Exercise plan bicycle and upper body strength; Accomplished 30 min  Likes work around the house Goal is to assist with back pain/ DDD      . Weight < 200 lb (90.719 kg)     Is using myfitnesspal to document and plans to start using a recumbant bike;       Fall Risk Fall Risk  10/19/2014 01/21/2014 04/15/2013  Falls in the past year? Yes Yes Yes  Number falls in past yr: 1 1 1   Injury with Fall? No No No  Risk for fall due to : Other (Comment) - -   Depression Screen PHQ 2/9 Scores 10/19/2014 01/21/2014 04/15/2013  PHQ - 2 Score 0 0 0     Cognitive Testing MMSE - Mini Mental State Exam 10/19/2014  Orientation to time 5  Orientation to Place 5  Registration 3  Attention/ Calculation 3  Language- name 2 objects 2  Language- repeat 1  Language- follow 3 step command 3  Language- read & follow direction 1  Write a sentence 1  Copy design 1    Immunization History  Administered Date(s) Administered  . Pneumococcal Conjugate-13 04/15/2013  . Pneumococcal Polysaccharide-23 04/27/2008  . Td 10/25/2008  . Zoster 10/20/2007   Screening Tests Health Maintenance  Topic Date Due  . INFLUENZA VACCINE  02/13/2016 (Originally 02/13/2015)  . OPHTHALMOLOGY EXAM  12/08/2014  . HEMOGLOBIN A1C  01/12/2015  . URINE MICROALBUMIN  01/12/2015  . FOOT EXAM  01/22/2015  . MAMMOGRAM  12/29/2015  . COLONOSCOPY  04/14/2016  . TETANUS/TDAP  10/26/2018  . DEXA SCAN  Completed  . ZOSTAVAX  Completed  . PNA vac Low Risk Adult  Completed      Plan:    During the course of the visit the patient was educated and counseled about the following appropriate screening and preventive services:   Vaccines to include  Pneumoccal, Influenza, Hepatitis B, Td, Zostavax, HCV  Electrocardiogram  Cardiovascular Disease  Colorectal cancer screening  Bone density screening  Diabetes screening  Glaucoma screening  Mammography/PAP  Nutrition counseling   Patient Instructions (the written plan) was given to the patient.   Gala Lewandowsky, RN  10/21/2014        Subjective:   Kelsey Keller is a 74 y.o. female who presents for Medicare Annual (Subsequent) preventive examination.  Review of Systems:   Cardiac Risk Factors include: advanced  age (>30men, >6 women);diabetes mellitus;hypertension;obesity (BMI >30kg/m2)     Objective:     Vitals: BP 144/84 mmHg  Pulse 69  Temp(Src) 97.7 F (36.5 C) (Oral)  Ht 5\' 5"  (1.651 m)  Wt 270 lb 8 oz (122.698 kg)  BMI 45.01 kg/m2  SpO2 96%  Tobacco History  Smoking status  . Never Smoker   Smokeless tobacco  . Not on file     Counseling given: Yes   Past Medical History  Diagnosis Date  . HYPOTHYROIDISM 04/29/2007  . DIABETES MELLITUS, TYPE II 06/23/2007  . HYPERLIPIDEMIA 04/29/2007  . HYPOKALEMIA 05/02/2009  . HYPERTENSION 04/29/2007  . ALLERGIC RHINITIS 04/29/2007  . OSTEOARTHRITIS, KNEE, LEFT 06/23/2007  . SPINAL STENOSIS, LUMBAR 04/29/2007  . LOW BACK PAIN 04/29/2007  . TOE PAIN 02/20/2010  . OSTEOPOROSIS NOS 05/12/2007  . HIATAL HERNIA, HX OF 04/29/2007   Past Surgical History  Procedure Laterality Date  . Total knee arthroplasty  2006    Right  . Total knee arthroplasty  2006    right  . Cataract extraction w/ intraocular lens  implant, bilateral Bilateral 05/2011    doing well  . Back surgery N/A 2014    Lower back? not sure of procedure, will get medical records for practice   Family History  Problem Relation Age of Onset  . Coronary artery disease Other     Female 1st degree relative  . Diabetes Other     1st degree relative  . Arthritis Mother   . Hypertension Mother   . Coronary artery disease Father   .  Arthritis Brother   . Stroke Brother   . Hyperlipidemia Brother   . COPD Brother    History  Sexual Activity  . Sexual Activity: Not on file    Outpatient Encounter Prescriptions as of 10/19/2014  Medication Sig  . alendronate (FOSAMAX) 70 MG tablet TAKE 1 TABLET EVERY 7 DAYS. TAKE WITH A FULL GLASS OF WATER ON AN EMPTY STOMACH.  Marland Kitchen aspirin 81 MG EC tablet Take 81 mg by mouth daily.    Marland Kitchen atenolol-chlorthalidone (TENORETIC) 50-25 MG per tablet TAKE 1 TABLET EVERY DAY  . calcium carbonate (TUMS EX) 750 MG chewable tablet Chew 1 tablet by mouth 2 (two) times daily.    . cetirizine (ZYRTEC) 10 MG tablet Take 1 tablet (10 mg total) by mouth daily.  Marland Kitchen co-enzyme Q-10 30 MG capsule Take 100 mg by mouth daily.  . fluticasone (FLONASE) 50 MCG/ACT nasal spray Place 2 sprays into both nostrils daily.  Marland Kitchen glimepiride (AMARYL) 4 MG tablet Take 1 tablet (4 mg total) by mouth 2 (two) times daily.  Marland Kitchen glucose blood test strip Use as directed two times a day, diagnosis code 250.00  . Lancets MISC Apply 1 application topically 2 (two) times daily. Use as directed two times a day, diagnosis code 250.00  . levothyroxine (SYNTHROID, LEVOTHROID) 150 MCG tablet TAKE 1 TABLET EVERY DAY  . meloxicam (MOBIC) 15 MG tablet TAKE 1 TABLET EVERY DAY  . metFORMIN (GLUCOPHAGE) 1000 MG tablet Take 1 tablet (1,000 mg total) by mouth 2 (two) times daily with a meal.  . Red Yeast Rice 600 MG CAPS Take 600 mg by mouth 2 (two) times daily.  . [DISCONTINUED] azithromycin (ZITHROMAX Z-PAK) 250 MG tablet Use as directed  . [DISCONTINUED] HYDROcodone-homatropine (HYCODAN) 5-1.5 MG/5ML syrup Take 5 mLs by mouth every 6 (six) hours as needed for cough.    Activities of Daily Living In your present state of health, do  you have any difficulty performing the following activities: 10/19/2014  Hearing? N  Vision? N  Difficulty concentrating or making decisions? N  Walking or climbing stairs? N  Dressing or bathing? N  Doing errands,  shopping? N  Preparing Food and eating ? N  Using the Toilet? N  In the past six months, have you accidently leaked urine? Y  Do you have problems with loss of bowel control? N  Managing your Medications? N  Managing your Finances? N  Housekeeping or managing your Housekeeping? N    Patient Care Team: Biagio Borg, MD as PCP - General    Assessment:     Exercise Activities and Dietary recommendations Current Exercise Habits:: Home exercise routine, Type of exercise: Other - see comments, Time (Minutes): 30, Frequency (Times/Week): 3, Weekly Exercise (Minutes/Week): 90  Goals    . Exercise 150 minutes per week (moderate activity)     Exercise plan bicycle and upper body strength; Accomplished 30 min  Likes work around the house Goal is to assist with back pain/ DDD      . Weight < 200 lb (90.719 kg)     Is using myfitnesspal to document and plans to start using a recumbant bike;       Fall Risk Fall Risk  10/19/2014 01/21/2014 04/15/2013  Falls in the past year? Yes Yes Yes  Number falls in past yr: 1 1 1   Injury with Fall? No No No  Risk for fall due to : Other (Comment) - -   Depression Screen PHQ 2/9 Scores 10/19/2014 01/21/2014 04/15/2013  PHQ - 2 Score 0 0 0     Cognitive Testing MMSE - Mini Mental State Exam 10/19/2014  Orientation to time 5  Orientation to Place 5  Registration 3  Attention/ Calculation 3  Language- name 2 objects 2  Language- repeat 1  Language- follow 3 step command 3  Language- read & follow direction 1  Write a sentence 1  Copy design 1    Immunization History  Administered Date(s) Administered  . Pneumococcal Conjugate-13 04/15/2013  . Pneumococcal Polysaccharide-23 04/27/2008  . Td 10/25/2008  . Zoster 10/20/2007   Screening Tests Health Maintenance  Topic Date Due  . INFLUENZA VACCINE  02/13/2016 (Originally 02/13/2015)  . OPHTHALMOLOGY EXAM  12/08/2014  . HEMOGLOBIN A1C  01/12/2015  . URINE MICROALBUMIN  01/12/2015  . FOOT EXAM   01/22/2015  . MAMMOGRAM  12/29/2015  . COLONOSCOPY  04/14/2016  . TETANUS/TDAP  10/26/2018  . DEXA SCAN  Completed  . ZOSTAVAX  Completed  . PNA vac Low Risk Adult  Completed      Plan:    During the course of the visit the patient was educated and counseled about the following appropriate screening and preventive services:   Vaccines to include Pneumoccal, Influenza, Hepatitis B, Td, Zostavax, HCV  Electrocardiogram  Cardiovascular Disease  Colorectal cancer screening  Bone density screening  Diabetes screening  Glaucoma screening  Mammography/PAP  Nutrition counseling   Patient Instructions (the written plan) was given to the patient.

## 2014-11-14 ENCOUNTER — Other Ambulatory Visit: Payer: Self-pay | Admitting: Internal Medicine

## 2014-11-23 ENCOUNTER — Other Ambulatory Visit: Payer: Self-pay

## 2014-11-23 DIAGNOSIS — Z1231 Encounter for screening mammogram for malignant neoplasm of breast: Secondary | ICD-10-CM

## 2014-12-27 ENCOUNTER — Ambulatory Visit: Payer: Commercial Managed Care - HMO

## 2015-01-06 ENCOUNTER — Ambulatory Visit
Admission: RE | Admit: 2015-01-06 | Discharge: 2015-01-06 | Disposition: A | Discharge status: Home / Self Care | Payer: Commercial Managed Care - HMO | Source: Ambulatory Visit

## 2015-01-06 DIAGNOSIS — Z1231 Encounter for screening mammogram for malignant neoplasm of breast: Secondary | ICD-10-CM

## 2015-01-10 ENCOUNTER — Other Ambulatory Visit (INDEPENDENT_AMBULATORY_CARE_PROVIDER_SITE_OTHER): Payer: Commercial Managed Care - HMO

## 2015-01-10 DIAGNOSIS — E119 Type 2 diabetes mellitus without complications: Secondary | ICD-10-CM

## 2015-01-10 DIAGNOSIS — Z0000 Encounter for general adult medical examination without abnormal findings: Secondary | ICD-10-CM

## 2015-01-10 DIAGNOSIS — Z0189 Encounter for other specified special examinations: Secondary | ICD-10-CM

## 2015-01-10 LAB — CBC WITH DIFFERENTIAL/PLATELET
Basophils Absolute: 0 10*3/uL (ref 0.0–0.1)
Basophils Relative: 0.7 % (ref 0.0–3.0)
EOS PCT: 5.2 % — AB (ref 0.0–5.0)
Eosinophils Absolute: 0.3 10*3/uL (ref 0.0–0.7)
HEMATOCRIT: 40.5 % (ref 36.0–46.0)
HEMOGLOBIN: 13.6 g/dL (ref 12.0–15.0)
Lymphocytes Relative: 22.3 % (ref 12.0–46.0)
Lymphs Abs: 1.2 10*3/uL (ref 0.7–4.0)
MCHC: 33.7 g/dL (ref 30.0–36.0)
MCV: 93.4 fl (ref 78.0–100.0)
MONOS PCT: 9.6 % (ref 3.0–12.0)
Monocytes Absolute: 0.5 10*3/uL (ref 0.1–1.0)
Neutro Abs: 3.5 10*3/uL (ref 1.4–7.7)
Neutrophils Relative %: 62.2 % (ref 43.0–77.0)
Platelets: 208 10*3/uL (ref 150.0–400.0)
RBC: 4.33 Mil/uL (ref 3.87–5.11)
RDW: 14.2 % (ref 11.5–15.5)
WBC: 5.6 10*3/uL (ref 4.0–10.5)

## 2015-01-10 LAB — LIPID PANEL
Cholesterol: 169 mg/dL (ref 0–200)
HDL: 58.7 mg/dL (ref >39.00)
LDL Cholesterol: 80 mg/dL (ref 0–99)
NONHDL: 110.3
Total CHOL/HDL Ratio: 3
Triglycerides: 150 mg/dL — ABNORMAL HIGH (ref 0.0–149.0)
VLDL: 30 mg/dL (ref 0.0–40.0)

## 2015-01-10 LAB — MICROALBUMIN / CREATININE URINE RATIO
Creatinine,U: 102.9 mg/dL
MICROALB/CREAT RATIO: 0.7 mg/g (ref 0.0–30.0)
Microalb, Ur: <0.7 mg/dL (ref 0.0–1.9)

## 2015-01-10 LAB — HEPATIC FUNCTION PANEL
ALK PHOS: 90 U/L (ref 39–117)
ALT: 57 U/L — ABNORMAL HIGH (ref 0–35)
AST: 58 U/L — ABNORMAL HIGH (ref 0–37)
Albumin: 3.9 g/dL (ref 3.5–5.2)
BILIRUBIN DIRECT: 0.1 mg/dL (ref 0.0–0.3)
BILIRUBIN TOTAL: 0.7 mg/dL (ref 0.2–1.2)
TOTAL PROTEIN: 7 g/dL (ref 6.0–8.3)

## 2015-01-10 LAB — HEMOGLOBIN A1C: HEMOGLOBIN A1C: 8.2 % — AB (ref 4.6–6.5)

## 2015-01-10 LAB — BASIC METABOLIC PANEL
BUN: 16 mg/dL (ref 6–23)
CHLORIDE: 98 meq/L (ref 96–112)
CO2: 31 mEq/L (ref 19–32)
Calcium: 10.2 mg/dL (ref 8.4–10.5)
Creatinine, Ser: 1.03 mg/dL (ref 0.40–1.20)
GFR: 55.95 mL/min — AB (ref >60.00)
GLUCOSE: 198 mg/dL — AB (ref 70–99)
Potassium: 4.2 mEq/L (ref 3.5–5.1)
SODIUM: 136 meq/L (ref 135–145)

## 2015-01-10 LAB — URINALYSIS, ROUTINE W REFLEX MICROSCOPIC
BILIRUBIN URINE: NEGATIVE
Hgb urine dipstick: NEGATIVE
Ketones, ur: NEGATIVE
LEUKOCYTES UA: NEGATIVE
NITRITE: NEGATIVE
RBC / HPF: NONE SEEN (ref 0–?)
Specific Gravity, Urine: 1.01 (ref 1.000–1.030)
Total Protein, Urine: NEGATIVE
UROBILINOGEN UA: 0.2 (ref 0.0–1.0)
Urine Glucose: NEGATIVE
pH: 6 (ref 5.0–8.0)

## 2015-01-10 LAB — TSH: TSH: 3.8 u[IU]/mL (ref 0.35–4.50)

## 2015-01-17 ENCOUNTER — Ambulatory Visit (INDEPENDENT_AMBULATORY_CARE_PROVIDER_SITE_OTHER): Payer: Commercial Managed Care - HMO | Admitting: Internal Medicine

## 2015-01-17 ENCOUNTER — Encounter: Payer: Self-pay | Admitting: Internal Medicine

## 2015-01-17 VITALS — BP 134/80 | HR 63 | Temp 97.9°F | Ht 66.0 in | Wt 269.0 lb

## 2015-01-17 DIAGNOSIS — R32 Unspecified urinary incontinence: Secondary | ICD-10-CM | POA: Diagnosis not present

## 2015-01-17 DIAGNOSIS — Z0000 Encounter for general adult medical examination without abnormal findings: Secondary | ICD-10-CM

## 2015-01-17 DIAGNOSIS — E119 Type 2 diabetes mellitus without complications: Secondary | ICD-10-CM

## 2015-01-17 DIAGNOSIS — I10 Essential (primary) hypertension: Secondary | ICD-10-CM | POA: Diagnosis not present

## 2015-01-17 DIAGNOSIS — M519 Unspecified thoracic, thoracolumbar and lumbosacral intervertebral disc disorder: Secondary | ICD-10-CM | POA: Insufficient documentation

## 2015-01-17 MED ORDER — CETIRIZINE HCL 10 MG PO TABS: 10.0000 mg | ORAL_TABLET | Freq: Every day | ORAL | Status: DC

## 2015-01-17 MED ORDER — PIOGLITAZONE HCL 15 MG PO TABS: 15.0000 mg | ORAL_TABLET | Freq: Every day | ORAL | Status: DC

## 2015-01-17 MED ORDER — FLUTICASONE PROPIONATE 50 MCG/ACT NA SUSP: 2.0000 | Freq: Every day | NASAL | Status: DC

## 2015-01-17 MED ORDER — LEVOTHYROXINE SODIUM 150 MCG PO TABS: 150.0000 ug | ORAL_TABLET | Freq: Every day | ORAL | Status: DC

## 2015-01-17 MED ORDER — ALENDRONATE SODIUM 70 MG PO TABS: 70.0000 mg | ORAL_TABLET | ORAL | Status: DC

## 2015-01-17 MED ORDER — ATENOLOL-CHLORTHALIDONE 50-25 MG PO TABS: 1.0000 | ORAL_TABLET | Freq: Every day | ORAL | Status: DC

## 2015-01-17 MED ORDER — GLIMEPIRIDE 4 MG PO TABS: 4.0000 mg | ORAL_TABLET | Freq: Two times a day (BID) | ORAL | Status: DC

## 2015-01-17 MED ORDER — MELOXICAM 15 MG PO TABS: 15.0000 mg | ORAL_TABLET | Freq: Every day | ORAL | Status: DC

## 2015-01-17 MED ORDER — METFORMIN HCL 1000 MG PO TABS: 1000.0000 mg | ORAL_TABLET | Freq: Two times a day (BID) | ORAL | Status: DC

## 2015-01-17 NOTE — Progress Notes (Signed)
Pre visit review using our clinic review tool, if applicable. No additional management support is needed unless otherwise documented below in the visit note. 

## 2015-01-17 NOTE — Progress Notes (Signed)
Subjective:    Patient ID: Kelsey Keller, female    DOB: 1941-03-07, 74 y.o.   MRN: [id number]  HPI  Here for wellness and f/u;  Overall doing ok;  Pt denies Chest pain, worsening SOB, DOE, wheezing, orthopnea, PND, worsening LE edema, palpitations, dizziness or syncope.  Pt denies neurological change such as new headache, facial or extremity weakness.  Pt denies polydipsia, polyuria, or low sugar symptoms. Pt states overall good compliance with treatment and medications, good tolerability, and has been trying to follow appropriate diet.  Pt denies worsening depressive symptoms, suicidal ideation or panic. No fever, night sweats, wt loss, loss of appetite, or other constitutional symptoms.  Pt states good ability with ADL's, has low fall risk, home safety reviewed and adequate, no other significant changes in hearing or vision, and only occasionally active with exercise. Walks with cane, no recent falls, ses Dr Roy/NS for lumbar surg x 2.  Pt continues to have recurring LBP without change in severity, bowel or bladder change, fever, wt loss,  worsening LE pain/numbness/weakness, gait change or falls, except for some left buttock pain which radiates the left ankle and foot occas. .  Gets better with tylenol. Often has to sleep in recliner to help the back  Denies urinary symptoms such as dysuria, frequency, urgency, flank pain, hematuria or n/v, fever, chills but does have overall worsening urinary incontinence, has not seen urology Past Medical History  Diagnosis Date  . HYPOTHYROIDISM 04/29/2007  . DIABETES MELLITUS, TYPE II 06/23/2007  . HYPERLIPIDEMIA 04/29/2007  . HYPOKALEMIA 05/02/2009  . HYPERTENSION 04/29/2007  . ALLERGIC RHINITIS 04/29/2007  . OSTEOARTHRITIS, KNEE, LEFT 06/23/2007  . SPINAL STENOSIS, LUMBAR 04/29/2007  . LOW BACK PAIN 04/29/2007  . TOE PAIN 02/20/2010  . OSTEOPOROSIS NOS 05/12/2007  . HIATAL HERNIA, HX OF 04/29/2007   Past Surgical History  Procedure Laterality Date  .  Total knee arthroplasty  2006    Right  . Total knee arthroplasty  2006    right  . Cataract extraction w/ intraocular lens  implant, bilateral Bilateral 05/2011    doing well  . Back surgery N/A 2014    Lower back? not sure of procedure, will get medical records for practice    reports that she has never smoked. She does not have any smokeless tobacco history on file. She reports that she drinks alcohol. She reports that she does not use illicit drugs. family history includes Arthritis in her brother and mother; COPD in her brother; Coronary artery disease in her father and other; Diabetes in her other; Hyperlipidemia in her brother; Hypertension in her mother; Stroke in her brother. No Known Allergies Current Outpatient Prescriptions on File Prior to Visit  Medication Sig Dispense Refill  . aspirin 81 MG EC tablet Take 81 mg by mouth daily.      . calcium carbonate (TUMS EX) 750 MG chewable tablet Chew 1 tablet by mouth 2 (two) times daily.      Marland Kitchen co-enzyme Q-10 30 MG capsule Take 100 mg by mouth daily.    Marland Kitchen glucose blood test strip Use as directed two times a day, diagnosis code 250.00 200 each 3  . Lancets MISC Apply 1 application topically 2 (two) times daily. Use as directed two times a day, diagnosis code 250.00 200 each 3  . Red Yeast Rice 600 MG CAPS Take 600 mg by mouth 2 (two) times daily.     No current facility-administered medications on file prior to visit.  Review of Systems Constitutional: Negative for increased diaphoresis, other activity, appetite or siginficant weight change other than noted HENT: Negative for worsening hearing loss, ear pain, facial swelling, mouth sores and neck stiffness.   Eyes: Negative for other worsening pain, redness or visual disturbance.  Respiratory: Negative for shortness of breath and wheezing  Cardiovascular: Negative for chest pain and palpitations.  Gastrointestinal: Negative for diarrhea, blood in stool, abdominal distention or other  pain Genitourinary: Negative for hematuria, flank pain or change in urine volume.  Musculoskeletal: Negative for myalgias or other joint complaints.  Skin: Negative for color change and wound or drainage.  Neurological: Negative for syncope and numbness. other than noted Hematological: Negative for adenopathy. or other swelling Psychiatric/Behavioral: Negative for hallucinations, SI, self-injury, decreased concentration or other worsening agitation.      Objective:   Physical Exam BP 134/80 mmHg  Pulse 63  Temp(Src) 97.9 F (36.6 C) (Oral)  Ht 5\' 6"  (1.676 m)  Wt 269 lb (122.018 kg)  BMI 43.44 kg/m2  SpO2 97% VS noted,  Constitutional: Pt is oriented to person, place, and time. Appears well-developed and well-nourished, in no significant distress Head: Normocephalic and atraumatic.  Right Ear: External ear normal.  Left Ear: External ear normal.  Nose: Nose normal.  Mouth/Throat: Oropharynx is clear and moist.  Eyes: Conjunctivae and EOM are normal. Pupils are equal, round, and reactive to light.  Neck: Normal range of motion. Neck supple. No JVD present. No tracheal deviation present or significant neck LA or mass Cardiovascular: Normal rate, regular rhythm, normal heart sounds and intact distal pulses.   Pulmonary/Chest: Effort normal and breath sounds without rales or wheezing  Abdominal: Soft. Bowel sounds are normal. NT. No HSM  Musculoskeletal: Normal range of motion. Exhibits no edema.  Lymphadenopathy:  Has no cervical adenopathy.  Neurological: Pt is alert and oriented to person, place, and time. Pt has normal reflexes. No cranial nerve deficit. Motor grossly intact Skin: Skin is warm and dry. No rash noted.  Psychiatric:  Has normal mood and affect. Behavior is normal.      Assessment & Plan:

## 2015-01-17 NOTE — Patient Instructions (Signed)
Please take all new medication as prescribed - the low dose generic actos 15 mg per day for diabetes  Please continue all other medications as before, and refills have been done if requested.  Please have the pharmacy call with any other refills you may need.  Please continue your efforts at being more active, low cholesterol diet, and weight control.  You are otherwise up to date with prevention measures today.  Please keep your appointments with your specialists as you may have planned  You will be contacted regarding the referral for: urology  Please return in 6 months, or sooner if needed, with Lab testing done 3-5 days before

## 2015-01-17 NOTE — Assessment & Plan Note (Addendum)
uncontrolled by a1cc, o/wstable overall by history and exam, recent data reviewed with pt, and pt to continue medical treatment as before except to add actos 15 qd,  to f/u any worsening symptoms or concerns le Lab Results  Component Value Date   HGBA1C 8.2* 01/10/2015

## 2015-01-17 NOTE — Assessment & Plan Note (Signed)

## 2015-01-17 NOTE — Assessment & Plan Note (Signed)
stable overall by history and exam, recent data reviewed with pt, and pt to continue medical treatment as before,  to f/u any worsening symptoms or concerns BP Readings from Last 3 Encounters:  01/17/15 134/80  10/19/14 144/84  07/26/14 138/84

## 2015-01-17 NOTE — Assessment & Plan Note (Signed)
Ok for urology referral.  

## 2015-01-18 ENCOUNTER — Encounter: Payer: Self-pay | Admitting: Internal Medicine

## 2015-01-23 ENCOUNTER — Encounter: Payer: Self-pay | Admitting: Internal Medicine

## 2015-01-23 DIAGNOSIS — L989 Disorder of the skin and subcutaneous tissue, unspecified: Secondary | ICD-10-CM

## 2015-01-24 ENCOUNTER — Ambulatory Visit: Payer: Commercial Managed Care - HMO | Admitting: Internal Medicine

## 2015-01-24 NOTE — Telephone Encounter (Signed)
Placed.

## 2015-01-24 NOTE — Telephone Encounter (Signed)
Please place referral for this pt in PCP's absence, thanks

## 2015-01-24 NOTE — Addendum Note (Signed)
Addended by: Vertell Novak A on: 01/24/2015 11:13 AM   Modules accepted: Orders

## 2015-03-09 ENCOUNTER — Ambulatory Visit (INDEPENDENT_AMBULATORY_CARE_PROVIDER_SITE_OTHER): Payer: Commercial Managed Care - HMO | Admitting: Internal Medicine

## 2015-03-09 ENCOUNTER — Telehealth: Payer: Self-pay

## 2015-03-09 ENCOUNTER — Encounter: Payer: Self-pay | Admitting: Internal Medicine

## 2015-03-09 VITALS — BP 130/80 | HR 72 | Temp 98.1°F | Ht 66.0 in | Wt 262.0 lb

## 2015-03-09 DIAGNOSIS — E118 Type 2 diabetes mellitus with unspecified complications: Secondary | ICD-10-CM

## 2015-03-09 DIAGNOSIS — E785 Hyperlipidemia, unspecified: Secondary | ICD-10-CM | POA: Diagnosis not present

## 2015-03-09 DIAGNOSIS — I10 Essential (primary) hypertension: Secondary | ICD-10-CM

## 2015-03-09 MED ORDER — PIOGLITAZONE HCL 15 MG PO TABS: 15.0000 mg | ORAL_TABLET | Freq: Every day | ORAL | Status: DC

## 2015-03-09 MED ORDER — METFORMIN HCL 1000 MG PO TABS: 1000.0000 mg | ORAL_TABLET | Freq: Two times a day (BID) | ORAL | Status: DC

## 2015-03-09 MED ORDER — HYDROCODONE-HOMATROPINE 5-1.5 MG/5ML PO SYRP: 5.0000 mL | ORAL_SOLUTION | Freq: Four times a day (QID) | ORAL | Status: DC | PRN

## 2015-03-09 MED ORDER — GLIMEPIRIDE 4 MG PO TABS: 4.0000 mg | ORAL_TABLET | Freq: Two times a day (BID) | ORAL | Status: DC

## 2015-03-09 MED ORDER — AZITHROMYCIN 250 MG PO TABS: ORAL_TABLET | ORAL | Status: DC

## 2015-03-09 NOTE — Progress Notes (Signed)
Pre visit review using our clinic review tool, if applicable. No additional management support is needed unless otherwise documented below in the visit note. 

## 2015-03-09 NOTE — Telephone Encounter (Signed)
A user error has taken place: discard

## 2015-03-09 NOTE — Assessment & Plan Note (Signed)
stable overall by history and exam, recent data reviewed with pt, and pt to continue medical treatment as before,  to f/u any worsening symptoms or concerns Lab Results  Component Value Date   LDLCALC 80 01/10/2015

## 2015-03-09 NOTE — Assessment & Plan Note (Signed)
Multifactorial increase in symptomatic elev glucose related to medication/diet noncompliance, wt changes, and possibly also acute bronchitis related today as well; to restart metformin and glimeparide, cont actos 15 qd, cont diet/excercise, wt loss efforts

## 2015-03-09 NOTE — Patient Instructions (Signed)
Please take all new medication as prescribed - the antibiotic and cough medicine  Please re-start the glimeparide and metformin  Please continue all other medications as before, including the actos  Please have the pharmacy call with any other refills you may need.  Please continue your efforts at being more active, low cholesterol diet, and weight control.  Please keep your appointments with your specialists as you may have planned

## 2015-03-09 NOTE — Progress Notes (Signed)
Subjective:    Patient ID: Kelsey Keller, female    DOB: 1941-06-11, 74 y.o.   MRN: [id number]  HPI  Here to f/u, since last visit mar 2016 started actos 15 mg (and stopped her glimeparide and glucophage even though this was not instructed, states she just assumed the actos was in place of the other 2), did go on cruise, gained some wt, experienced polydipsia/polyuria, appetite lower, and has lost wt since then.  Overall good compliance with treatment, and good medicine tolerability.   Pt denies low sugar symptoms such as weakness or confusion improved with po intake.  Pt states overall good compliance with meds, trying to follow lower cholesterol, diabetic diet, wt overall stable but little exercise however.    Walks with cane .  CBG's often 200's until today for some reason 66 (states she knows the large sweet tea from [employer] probably didn't help) and scared her to come in today Also incidnetly - Here with acute onset mild to mod 2-3 days ST, HA, general weakness and malaise, with prod cough greenish sputum, but Pt denies chest pain, increased sob or doe, wheezing, orthopnea, PND, increased LE swelling, palpitations  Leaaving tomorrow for a week at the beach with a friend in her 5th wheel Wt Readings from Last 3 Encounters:  03/09/15 262 lb (118.842 kg)  01/17/15 269 lb (122.018 kg)  10/19/14 270 lb 8 oz (122.698 kg)   Past Medical History  Diagnosis Date  . HYPOTHYROIDISM 04/29/2007  . DIABETES MELLITUS, TYPE II 06/23/2007  . HYPERLIPIDEMIA 04/29/2007  . HYPOKALEMIA 05/02/2009  . HYPERTENSION 04/29/2007  . ALLERGIC RHINITIS 04/29/2007  . OSTEOARTHRITIS, KNEE, LEFT 06/23/2007  . SPINAL STENOSIS, LUMBAR 04/29/2007  . LOW BACK PAIN 04/29/2007  . TOE PAIN 02/20/2010  . OSTEOPOROSIS NOS 05/12/2007  . HIATAL HERNIA, HX OF 04/29/2007   Past Surgical History  Procedure Laterality Date  . Total knee arthroplasty  2006    Right  . Total knee arthroplasty  2006    right  . Cataract  extraction w/ intraocular lens  implant, bilateral Bilateral 05/2011    doing well  . Back surgery N/A 2014    Lower back? not sure of procedure, will get medical records for practice    reports that she has never smoked. She does not have any smokeless tobacco history on file. She reports that she drinks alcohol. She reports that she does not use illicit drugs. family history includes Arthritis in her brother and mother; COPD in her brother; Coronary artery disease in her father and other; Diabetes in her other; Hyperlipidemia in her brother; Hypertension in her mother; Stroke in her brother. No Known Allergies Current Outpatient Prescriptions on File Prior to Visit  Medication Sig Dispense Refill  . alendronate (FOSAMAX) 70 MG tablet Take 1 tablet (70 mg total) by mouth once a week. Take with a full glass of water on an empty stomach. 12 tablet 3  . aspirin 81 MG EC tablet Take 81 mg by mouth daily.      Marland Kitchen atenolol-chlorthalidone (TENORETIC) 50-25 MG per tablet Take 1 tablet by mouth daily. 90 tablet 3  . calcium carbonate (TUMS EX) 750 MG chewable tablet Chew 1 tablet by mouth 2 (two) times daily.      . cetirizine (ZYRTEC) 10 MG tablet Take 1 tablet (10 mg total) by mouth daily. 90 tablet 3  . co-enzyme Q-10 30 MG capsule Take 100 mg by mouth daily.    . fluticasone (FLONASE) 50  MCG/ACT nasal spray Place 2 sprays into both nostrils daily. 48 g 3  . glucose blood test strip Use as directed two times a day, diagnosis code 250.00 200 each 3  . Lancets MISC Apply 1 application topically 2 (two) times daily. Use as directed two times a day, diagnosis code 250.00 200 each 3  . levothyroxine (SYNTHROID, LEVOTHROID) 150 MCG tablet Take 1 tablet (150 mcg total) by mouth daily. 90 tablet 3  . meloxicam (MOBIC) 15 MG tablet Take 1 tablet (15 mg total) by mouth daily. 90 tablet 3  . pioglitazone (ACTOS) 15 MG tablet Take 1 tablet (15 mg total) by mouth daily. 90 tablet 3  . Red Yeast Rice 600 MG CAPS  Take 600 mg by mouth 2 (two) times daily.    Marland Kitchen glimepiride (AMARYL) 4 MG tablet Take 1 tablet (4 mg total) by mouth 2 (two) times daily. (Patient not taking: Reported on 03/09/2015) 180 tablet 3  . metFORMIN (GLUCOPHAGE) 1000 MG tablet Take 1 tablet (1,000 mg total) by mouth 2 (two) times daily with a meal. (Patient not taking: Reported on 03/09/2015) 180 tablet 3   No current facility-administered medications on file prior to visit.    Review of Systems   Constitutional: Negative for unusual diaphoresis or night sweats HENT: Negative for ringing in ear or discharge Eyes: Negative for double vision or worsening visual disturbance.  Respiratory: Negative for choking and stridor.   Gastrointestinal: Negative for vomiting or other signifcant bowel change Genitourinary: Negative for hematuria or change in urine volume.  Musculoskeletal: Negative for other MSK pain or swelling Skin: Negative for color change and worsening wound.  Neurological: Negative for tremors and numbness other than noted  Psychiatric/Behavioral: Negative for decreased concentration or agitation other than above       Objective:   Physical Exam BP 130/80 mmHg  Pulse 72  Temp(Src) 98.1 F (36.7 C) (Oral)  Ht 5\' 6"  (1.676 m)  Wt 262 lb (118.842 kg)  BMI 42.31 kg/m2  SpO2 96% VS noted, mild ill appearing, coughing non prod Constitutional: Pt appears in no significant distress HENT: Head: NCAT.  Right Ear: External ear normal.  Left Ear: External ear normal.  Bilat tm's with mild erythema.  Max sinus areas non tender.  Pharynx with mild erythema, no exudate Eyes: . Pupils are equal, round, and reactive to light. Conjunctivae and EOM are normal Neck: Normal range of motion. Neck supple.  Cardiovascular: Normal rate and regular rhythm.   Pulmonary/Chest: Effort normal and breath sounds without rales or wheezing.  Neurological: Pt is alert. Not confused , motor grossly intact Skin: Skin is warm. No rash, no LE  edema Psychiatric: Pt behavior is normal. No agitation.     Assessment & Plan:

## 2015-03-09 NOTE — Assessment & Plan Note (Signed)
stable overall by history and exam, recent data reviewed with pt, and pt to continue medical treatment as before,  to f/u any worsening symptoms or concerns BP Readings from Last 3 Encounters:  03/09/15 130/80  01/17/15 134/80  10/19/14 144/84

## 2015-03-10 ENCOUNTER — Encounter: Payer: Self-pay | Admitting: Internal Medicine

## 2015-06-06 ENCOUNTER — Telehealth: Payer: Self-pay | Admitting: Internal Medicine

## 2015-06-06 NOTE — Telephone Encounter (Signed)
Patient states that she has spoken with you in the past regarding her legs. The suspected issue was neuropathy. She states that the pain now has her up at night. She is requesting that something be called in or advice given.

## 2015-06-07 ENCOUNTER — Encounter: Payer: Self-pay | Admitting: Internal Medicine

## 2015-06-07 MED ORDER — GABAPENTIN 300 MG PO CAPS: ORAL_CAPSULE | ORAL | Status: DC

## 2015-06-07 NOTE — Telephone Encounter (Signed)
gabapentin done erx to [facility]

## 2015-06-07 NOTE — Telephone Encounter (Signed)
Pt phone number on file is incorrect. Contacted pharmacy to advise pt

## 2015-07-12 ENCOUNTER — Other Ambulatory Visit: Payer: Commercial Managed Care - HMO

## 2015-07-20 ENCOUNTER — Ambulatory Visit (INDEPENDENT_AMBULATORY_CARE_PROVIDER_SITE_OTHER): Payer: Commercial Managed Care - HMO | Admitting: Internal Medicine

## 2015-07-20 ENCOUNTER — Other Ambulatory Visit (INDEPENDENT_AMBULATORY_CARE_PROVIDER_SITE_OTHER): Payer: Commercial Managed Care - HMO

## 2015-07-20 ENCOUNTER — Encounter: Payer: Self-pay | Admitting: Internal Medicine

## 2015-07-20 VITALS — BP 124/82 | HR 62 | Temp 97.8°F | Ht 66.0 in | Wt 276.0 lb

## 2015-07-20 DIAGNOSIS — Z0001 Encounter for general adult medical examination with abnormal findings: Secondary | ICD-10-CM

## 2015-07-20 DIAGNOSIS — Z0000 Encounter for general adult medical examination without abnormal findings: Secondary | ICD-10-CM | POA: Diagnosis not present

## 2015-07-20 DIAGNOSIS — E118 Type 2 diabetes mellitus with unspecified complications: Secondary | ICD-10-CM

## 2015-07-20 DIAGNOSIS — M545 Low back pain: Secondary | ICD-10-CM | POA: Diagnosis not present

## 2015-07-20 DIAGNOSIS — I10 Essential (primary) hypertension: Secondary | ICD-10-CM

## 2015-07-20 DIAGNOSIS — E785 Hyperlipidemia, unspecified: Secondary | ICD-10-CM

## 2015-07-20 LAB — HEPATIC FUNCTION PANEL
ALBUMIN: 4 g/dL (ref 3.5–5.2)
ALK PHOS: 76 U/L (ref 39–117)
ALT: 45 U/L — ABNORMAL HIGH (ref 0–35)
AST: 62 U/L — AB (ref 0–37)
BILIRUBIN DIRECT: 0.2 mg/dL (ref 0.0–0.3)
BILIRUBIN TOTAL: 0.8 mg/dL (ref 0.2–1.2)
Total Protein: 6.8 g/dL (ref 6.0–8.3)

## 2015-07-20 LAB — URINALYSIS, ROUTINE W REFLEX MICROSCOPIC
BILIRUBIN URINE: NEGATIVE
HGB URINE DIPSTICK: NEGATIVE
LEUKOCYTES UA: NEGATIVE
NITRITE: NEGATIVE
RBC / HPF: NONE SEEN (ref 0–?)
Total Protein, Urine: NEGATIVE
URINE GLUCOSE: NEGATIVE
Urobilinogen, UA: 0.2 (ref 0.0–1.0)
pH: 6 (ref 5.0–8.0)

## 2015-07-20 LAB — LIPID PANEL
CHOLESTEROL: 173 mg/dL (ref 0–200)
HDL: 69.4 mg/dL (ref >39.00)
LDL CALC: 79 mg/dL (ref 0–99)
NONHDL: 103.16
Total CHOL/HDL Ratio: 2
Triglycerides: 119 mg/dL (ref 0.0–149.0)
VLDL: 23.8 mg/dL (ref 0.0–40.0)

## 2015-07-20 LAB — BASIC METABOLIC PANEL
BUN: 25 mg/dL — ABNORMAL HIGH (ref 6–23)
CO2: 31 mEq/L (ref 19–32)
Calcium: 10.5 mg/dL (ref 8.4–10.5)
Chloride: 102 mEq/L (ref 96–112)
Creatinine, Ser: 1.01 mg/dL (ref 0.40–1.20)
GFR: 57.15 mL/min — AB (ref >60.00)
GLUCOSE: 205 mg/dL — AB (ref 70–99)
POTASSIUM: 4.9 meq/L (ref 3.5–5.1)
SODIUM: 139 meq/L (ref 135–145)

## 2015-07-20 LAB — MICROALBUMIN / CREATININE URINE RATIO
CREATININE, U: 221.8 mg/dL
MICROALB UR: 0.9 mg/dL (ref 0.0–1.9)
Microalb Creat Ratio: 0.4 mg/g (ref 0.0–30.0)

## 2015-07-20 LAB — CBC WITH DIFFERENTIAL/PLATELET
BASOS PCT: 0.6 % (ref 0.0–3.0)
Basophils Absolute: 0 10*3/uL (ref 0.0–0.1)
EOS PCT: 6.1 % — AB (ref 0.0–5.0)
Eosinophils Absolute: 0.3 10*3/uL (ref 0.0–0.7)
HCT: 39.3 % (ref 36.0–46.0)
Hemoglobin: 13.1 g/dL (ref 12.0–15.0)
LYMPHS ABS: 1.2 10*3/uL (ref 0.7–4.0)
Lymphocytes Relative: 24.4 % (ref 12.0–46.0)
MCHC: 33.2 g/dL (ref 30.0–36.0)
MCV: 95.8 fl (ref 78.0–100.0)
MONO ABS: 0.5 10*3/uL (ref 0.1–1.0)
MONOS PCT: 10.1 % (ref 3.0–12.0)
NEUTROS ABS: 3 10*3/uL (ref 1.4–7.7)
NEUTROS PCT: 58.8 % (ref 43.0–77.0)
Platelets: 188 10*3/uL (ref 150.0–400.0)
RBC: 4.1 Mil/uL (ref 3.87–5.11)
RDW: 14.3 % (ref 11.5–15.5)
WBC: 5.1 10*3/uL (ref 4.0–10.5)

## 2015-07-20 LAB — TSH: TSH: 4.49 u[IU]/mL (ref 0.35–4.50)

## 2015-07-20 LAB — HEMOGLOBIN A1C: Hgb A1c MFr Bld: 7.1 % — ABNORMAL HIGH (ref 4.6–6.5)

## 2015-07-20 MED ORDER — GLUCOSE BLOOD VI STRP: ORAL_STRIP | Status: DC

## 2015-07-20 MED ORDER — GABAPENTIN 600 MG PO TABS: 600.0000 mg | ORAL_TABLET | Freq: Three times a day (TID) | ORAL | Status: DC

## 2015-07-20 NOTE — Progress Notes (Signed)
Pre visit review using our clinic review tool, if applicable. No additional management support is needed unless otherwise documented below in the visit note. 

## 2015-07-20 NOTE — Assessment & Plan Note (Signed)
stable overall by history and exam, recent data reviewed with pt, and pt to continue medical treatment as before,  to f/u any worsening symptoms or concerns BP Readings from Last 3 Encounters:  07/20/15 124/82  03/09/15 130/80  01/17/15 134/80

## 2015-07-20 NOTE — Assessment & Plan Note (Signed)
stable overall by history and exam, recent data reviewed with pt, and pt to continue medical treatment as before,  to f/u any worsening symptoms or concerns Lab Results  Component Value Date   HGBA1C 8.2* 01/10/2015

## 2015-07-20 NOTE — Assessment & Plan Note (Signed)

## 2015-07-20 NOTE — Assessment & Plan Note (Signed)
stable overall by history and exam, recent data reviewed with pt, and pt to continue medical treatment as before,  to f/u any worsening symptoms or concerns Lab Results  Component Value Date   LDLCALC 80 01/10/2015    

## 2015-07-20 NOTE — Assessment & Plan Note (Signed)
[place] for increased gabapentin to 600 tid

## 2015-07-20 NOTE — Progress Notes (Signed)
Subjective:    Patient ID: Kelsey Keller, female    DOB: Dec 19, 1940, 75 y.o.   MRN: [id number]  HPI  Here for wellness and f/u;  Overall doing ok;  Pt denies Chest pain, worsening SOB, DOE, wheezing, orthopnea, PND, worsening LE edema, palpitations, dizziness or syncope.  Pt denies neurological change such as new headache, facial or extremity weakness.  Pt denies polydipsia, polyuria, or low sugar symptoms. Pt states overall good compliance with treatment and medications, good tolerability, and has been trying to follow appropriate diet.  Pt denies worsening depressive symptoms, suicidal ideation or panic. No fever, night sweats, wt loss, loss of appetite, or other constitutional symptoms.  Pt states good ability with ADL's, has low fall risk, home safety reviewed and adequate, no other significant changes in hearing or vision, and only occasionally active with exercise.  Still walks with cane but no falls.  S/p surgury x 2 in 2014 per Dr Carloyn Manner in [place], both lumbar. . Pt continues to have recurring LBP without change in severity, bowel or bladder change, fever, wt loss,  worsening LE pain/numbness/weakness, gait change or falls, except for worsening/peristent sciatica like pain on the left. Past Medical History  Diagnosis Date  . HYPOTHYROIDISM 04/29/2007  . DIABETES MELLITUS, TYPE II 06/23/2007  . HYPERLIPIDEMIA 04/29/2007  . HYPOKALEMIA 05/02/2009  . HYPERTENSION 04/29/2007  . ALLERGIC RHINITIS 04/29/2007  . OSTEOARTHRITIS, KNEE, LEFT 06/23/2007  . SPINAL STENOSIS, LUMBAR 04/29/2007  . LOW BACK PAIN 04/29/2007  . TOE PAIN 02/20/2010  . OSTEOPOROSIS NOS 05/12/2007  . HIATAL HERNIA, HX OF 04/29/2007   Past Surgical History  Procedure Laterality Date  . Total knee arthroplasty  2006    Right  . Total knee arthroplasty  2006    right  . Cataract extraction w/ intraocular lens  implant, bilateral Bilateral 05/2011    doing well  . Back surgery N/A 2014    Lower back? not sure of procedure,  will get medical records for practice    reports that she has never smoked. She does not have any smokeless tobacco history on file. She reports that she drinks alcohol. She reports that she does not use illicit drugs. family history includes Arthritis in her brother and mother; COPD in her brother; Coronary artery disease in her father and other; Diabetes in her other; Hyperlipidemia in her brother; Hypertension in her mother; Stroke in her brother. No Known Allergies Current Outpatient Prescriptions on File Prior to Visit  Medication Sig Dispense Refill  . alendronate (FOSAMAX) 70 MG tablet Take 1 tablet (70 mg total) by mouth once a week. Take with a full glass of water on an empty stomach. 12 tablet 3  . aspirin 81 MG EC tablet Take 81 mg by mouth daily.      Marland Kitchen atenolol-chlorthalidone (TENORETIC) 50-25 MG per tablet Take 1 tablet by mouth daily. 90 tablet 3  . calcium carbonate (TUMS EX) 750 MG chewable tablet Chew 1 tablet by mouth 2 (two) times daily.      Marland Kitchen co-enzyme Q-10 30 MG capsule Take 100 mg by mouth daily.    Marland Kitchen gabapentin (NEURONTIN) 300 MG capsule 1-2 tabs by mouth at bedtime as needed 180 capsule 1  . glimepiride (AMARYL) 4 MG tablet Take 1 tablet (4 mg total) by mouth 2 (two) times daily. 180 tablet 3  . Lancets MISC Apply 1 application topically 2 (two) times daily. Use as directed two times a day, diagnosis code 250.00 200 each 3  .  levothyroxine (SYNTHROID, LEVOTHROID) 150 MCG tablet Take 1 tablet (150 mcg total) by mouth daily. 90 tablet 3  . meloxicam (MOBIC) 15 MG tablet Take 1 tablet (15 mg total) by mouth daily. 90 tablet 3  . metFORMIN (GLUCOPHAGE) 1000 MG tablet Take 1 tablet (1,000 mg total) by mouth 2 (two) times daily with a meal. 180 tablet 3  . pioglitazone (ACTOS) 15 MG tablet Take 1 tablet (15 mg total) by mouth daily. 90 tablet 3  . Red Yeast Rice 600 MG CAPS Take 600 mg by mouth 2 (two) times daily.    Marland Kitchen azithromycin (ZITHROMAX Z-PAK) 250 MG tablet Use as  directed (Patient not taking: Reported on 07/20/2015) 6 tablet 1  . cetirizine (ZYRTEC) 10 MG tablet Take 1 tablet (10 mg total) by mouth daily. (Patient not taking: Reported on 07/20/2015) 90 tablet 3  . fluticasone (FLONASE) 50 MCG/ACT nasal spray Place 2 sprays into both nostrils daily. (Patient not taking: Reported on 07/20/2015) 48 g 3  . HYDROcodone-homatropine (HYCODAN) 5-1.5 MG/5ML syrup Take 5 mLs by mouth every 6 (six) hours as needed for cough. (Patient not taking: Reported on 07/20/2015) 180 mL 0   No current facility-administered medications on file prior to visit.   Review of Systems Constitutional: Negative for increased diaphoresis, other activity, appetite or siginficant weight change other than noted HENT: Negative for worsening hearing loss, ear pain, facial swelling, mouth sores and neck stiffness.   Eyes: Negative for other worsening pain, redness or visual disturbance.  Respiratory: Negative for shortness of breath and wheezing  Cardiovascular: Negative for chest pain and palpitations.  Gastrointestinal: Negative for diarrhea, blood in stool, abdominal distention or other pain Genitourinary: Negative for hematuria, flank pain or change in urine volume.  Musculoskeletal: Negative for myalgias or other joint complaints.  Skin: Negative for color change and wound or drainage.  Neurological: Negative for syncope and numbness. other than noted Hematological: Negative for adenopathy. or other swelling Psychiatric/Behavioral: Negative for hallucinations, SI, self-injury, decreased concentration or other worsening agitation.      Objective:   Physical Exam BP 124/82 mmHg  Pulse 62  Temp(Src) 97.8 F (36.6 C) (Oral)  Ht 5\' 6"  (1.676 m)  Wt 276 lb (125.193 kg)  BMI 44.57 kg/m2  SpO2 97% VS noted,  Constitutional: Pt is oriented to person, place, and time. Appears well-developed and well-nourished, in no significant distress Head: Normocephalic and atraumatic.  Right Ear:  External ear normal.  Left Ear: External ear normal.  Nose: Nose normal.  Mouth/Throat: Oropharynx is clear and moist.  Eyes: Conjunctivae and EOM are normal. Pupils are equal, round, and reactive to light.  Neck: Normal range of motion. Neck supple. No JVD present. No tracheal deviation present or significant neck LA or mass Cardiovascular: Normal rate, regular rhythm, normal heart sounds and intact distal pulses.   Pulmonary/Chest: Effort normal and breath sounds without rales or wheezing  Abdominal: Soft. Bowel sounds are normal. NT. No HSM  Musculoskeletal: Normal range of motion. Exhibits no edema.  Lymphadenopathy:  Has no cervical adenopathy.  Neurological: Pt is alert and oriented to person, place, and time. Pt has normal reflexes. No cranial nerve deficit. Motor grossly intact Skin: Skin is warm and dry. No rash noted.  Psychiatric:  Has normal mood and affect. Behavior is normal.     Assessment & Plan:

## 2015-07-20 NOTE — Patient Instructions (Signed)
OK to increase the gabapentin to 600 mg three times per day  Please continue all other medications as before, and refills have been done if requested.  Please have the pharmacy call with any other refills you may need.  Please continue your efforts at being more active, low cholesterol diet, and weight control.  You are otherwise up to date with prevention measures today.  Please keep your appointments with your specialists as you may have planned  Please go to the LAB in the Basement (turn left off the elevator) for the tests to be done today  You will be contacted by phone if any changes need to be made immediately.  Otherwise, you will receive a letter about your results with an explanation, but please check with MyChart first.  Please remember to sign up for MyChart if you have not done so, as this will be important to you in the future with finding out test results, communicating by private email, and scheduling acute appointments online when needed.  Please return in 6 months, or sooner if needed, with Lab testing done 3-5 days before

## 2015-09-01 ENCOUNTER — Telehealth: Payer: Self-pay | Admitting: Internal Medicine

## 2015-09-06 ENCOUNTER — Other Ambulatory Visit: Payer: Self-pay | Admitting: Neurosurgery

## 2015-09-06 DIAGNOSIS — M47816 Spondylosis without myelopathy or radiculopathy, lumbar region: Secondary | ICD-10-CM

## 2015-09-08 ENCOUNTER — Ambulatory Visit
Admission: RE | Admit: 2015-09-08 | Discharge: 2015-09-08 | Disposition: A | Discharge status: Home / Self Care | Payer: Commercial Managed Care - HMO | Source: Ambulatory Visit | Attending: Neurosurgery | Admitting: Neurosurgery

## 2015-09-08 DIAGNOSIS — M47816 Spondylosis without myelopathy or radiculopathy, lumbar region: Secondary | ICD-10-CM

## 2015-10-13 ENCOUNTER — Other Ambulatory Visit: Payer: Self-pay | Admitting: Internal Medicine

## 2015-12-04 ENCOUNTER — Other Ambulatory Visit: Payer: Self-pay | Admitting: Internal Medicine

## 2015-12-05 ENCOUNTER — Telehealth: Payer: Self-pay

## 2015-12-05 DIAGNOSIS — L989 Disorder of the skin and subcutaneous tissue, unspecified: Secondary | ICD-10-CM

## 2015-12-05 NOTE — Telephone Encounter (Signed)
Patient state she needs a referral so she can go see DR. Lupon he is a Paediatric nurse . The office told her she needs a referral. Please follow up.

## 2015-12-05 NOTE — Telephone Encounter (Signed)
Please advise 

## 2015-12-05 NOTE — Telephone Encounter (Signed)
Ok, his has been done

## 2015-12-18 ENCOUNTER — Other Ambulatory Visit: Payer: Self-pay | Admitting: Internal Medicine

## 2015-12-18 ENCOUNTER — Ambulatory Visit (INDEPENDENT_AMBULATORY_CARE_PROVIDER_SITE_OTHER): Payer: Commercial Managed Care - HMO | Admitting: Family

## 2015-12-18 ENCOUNTER — Encounter: Payer: Self-pay | Admitting: Family

## 2015-12-18 ENCOUNTER — Ambulatory Visit (INDEPENDENT_AMBULATORY_CARE_PROVIDER_SITE_OTHER)
Admission: RE | Admit: 2015-12-18 | Discharge: 2015-12-18 | Disposition: A | Discharge status: Home / Self Care | Payer: Commercial Managed Care - HMO | Source: Ambulatory Visit | Attending: Family | Admitting: Family

## 2015-12-18 ENCOUNTER — Telehealth: Payer: Self-pay

## 2015-12-18 VITALS — BP 170/80 | HR 76 | Temp 97.9°F | Ht 66.0 in | Wt 281.2 lb

## 2015-12-18 DIAGNOSIS — M25551 Pain in right hip: Secondary | ICD-10-CM

## 2015-12-18 DIAGNOSIS — Z1231 Encounter for screening mammogram for malignant neoplasm of breast: Secondary | ICD-10-CM

## 2015-12-18 MED ORDER — DICLOFENAC SODIUM 1 % TD GEL: 4.0000 g | Freq: Four times a day (QID) | TRANSDERMAL | Status: DC

## 2015-12-18 NOTE — Patient Instructions (Signed)
Trial of voltaren gel.   You may also try Thermacare and Capsaicin.   PT referral if no improvement.   If there is no improvement in your symptoms, or if there is any worsening of symptoms, or if you have any additional concerns, please return for re-evaluation; or, if we are closed, consider going to the Emergency Room for evaluation if symptoms urgent.   Low Back Sprain With Rehab A sprain is an injury in which a ligament is torn. The ligaments of the lower back are vulnerable to sprains. However, they are strong and require great force to be injured. These ligaments are important for stabilizing the spinal column. Sprains are classified into three categories. Grade 1 sprains cause pain, but the tendon is not lengthened. Grade 2 sprains include a lengthened ligament, due to the ligament being stretched or partially ruptured. With grade 2 sprains there is still function, although the function may be decreased. Grade 3 sprains involve a complete tear of the tendon or muscle, and function is usually impaired. SYMPTOMS   Severe pain in the lower back.  Sometimes, a feeling of a "pop," "snap," or tear, at the time of injury.  Tenderness and sometimes swelling at the injury site.  Uncommonly, bruising (contusion) within 48 hours of injury.  Muscle spasms in the back. CAUSES  Low back sprains occur when a force is placed on the ligaments that is greater than they can handle. Common causes of injury include:  Performing a stressful act while off-balance.  Repetitive stressful activities that involve movement of the lower back.  Direct hit (trauma) to the lower back. RISK INCREASES WITH:  Contact sports (football, wrestling).  Collisions (major skiing accidents).  Sports that require throwing or lifting (baseball, weightlifting).  Sports involving twisting of the spine (gymnastics, diving, tennis, golf).  Poor strength and flexibility.  Inadequate protection.  Previous back  injury or surgery (especially fusion). PREVENTION  Wear properly fitted and padded protective equipment.  Warm up and stretch properly before activity.  Allow for adequate recovery between workouts.  Maintain physical fitness:  Strength, flexibility, and endurance.  Cardiovascular fitness.  Maintain a healthy body weight. PROGNOSIS  If treated properly, low back sprains usually heal with non-surgical treatment. The length of time for healing depends on the severity of the injury.  RELATED COMPLICATIONS   Recurring symptoms, resulting in a chronic problem.  Chronic inflammation and pain in the low back.  Delayed healing or resolution of symptoms, especially if activity is resumed too soon.  Prolonged impairment.  Unstable or arthritic joints of the low back. TREATMENT  Treatment first involves the use of ice and medicine, to reduce pain and inflammation. The use of strengthening and stretching exercises may help reduce pain with activity. These exercises may be performed at home or with a therapist. Severe injuries may require referral to a therapist for further evaluation and treatment, such as ultrasound. Your caregiver may advise that you wear a back brace or corset, to help reduce pain and discomfort. Often, prolonged bed rest results in greater harm then benefit. Corticosteroid injections may be recommended. However, these should be reserved for the most serious cases. It is important to avoid using your back when lifting objects. At night, sleep on your back on a firm mattress, with a pillow placed under your knees. If non-surgical treatment is unsuccessful, surgery may be needed.  MEDICATION   If pain medicine is needed, nonsteroidal anti-inflammatory medicines (aspirin and ibuprofen), or other minor pain relievers (acetaminophen), are  often advised.  Do not take pain medicine for 7 days before surgery.  Prescription pain relievers may be given, if your caregiver thinks  they are needed. Use only as directed and only as much as you need.  Ointments applied to the skin may be helpful.  Corticosteroid injections may be given by your caregiver. These injections should be reserved for the most serious cases, because they may only be given a certain number of times. HEAT AND COLD  Cold treatment (icing) should be applied for 10 to 15 minutes every 2 to 3 hours for inflammation and pain, and immediately after activity that aggravates your symptoms. Use ice packs or an ice massage.  Heat treatment may be used before performing stretching and strengthening activities prescribed by your caregiver, physical therapist, or athletic trainer. Use a heat pack or a warm water soak. SEEK MEDICAL CARE IF:   Symptoms get worse or do not improve in 2 to 4 weeks, despite treatment.  You develop numbness or weakness in either leg.  You lose bowel or bladder function.  Any of the following occur after surgery: fever, increased pain, swelling, redness, drainage of fluids, or bleeding in the affected area.  New, unexplained symptoms develop. (Drugs used in treatment may produce side effects.) EXERCISES  RANGE OF MOTION (ROM) AND STRETCHING EXERCISES - Low Back Sprain Most people with lower back pain will find that their symptoms get worse with excessive bending forward (flexion) or arching at the lower back (extension). The exercises that will help resolve your symptoms will focus on the opposite motion.  Your physician, physical therapist or athletic trainer will help you determine which exercises will be most helpful to resolve your lower back pain. Do not complete any exercises without first consulting with your caregiver. Discontinue any exercises which make your symptoms worse, until you speak to your caregiver. If you have pain, numbness or tingling which travels down into your buttocks, leg or foot, the goal of the therapy is for these symptoms to move closer to your back and  eventually resolve. Sometimes, these leg symptoms will get better, but your lower back pain may worsen. This is often an indication of progress in your rehabilitation. Be very alert to any changes in your symptoms and the activities in which you participated in the 24 hours prior to the change. Sharing this information with your caregiver will allow him or her to most efficiently treat your condition. These exercises may help you when beginning to rehabilitate your injury. Your symptoms may resolve with or without further involvement from your physician, physical therapist or athletic trainer. While completing these exercises, remember:   Restoring tissue flexibility helps normal motion to return to the joints. This allows healthier, less painful movement and activity.  An effective stretch should be held for at least 30 seconds.  A stretch should never be painful. You should only feel a gentle lengthening or release in the stretched tissue. FLEXION RANGE OF MOTION AND STRETCHING EXERCISES: STRETCH - Flexion, Single Knee to Chest   Lie on a firm bed or floor with both legs extended in front of you.  Keeping one leg in contact with the floor, bring your opposite knee to your chest. Hold your leg in place by either grabbing behind your thigh or at your knee.  Pull until you feel a gentle stretch in your low back. Hold __________ seconds.  Slowly release your grasp and repeat the exercise with the opposite side. Repeat __________ times. Complete this  exercise __________ times per day.  STRETCH - Flexion, Double Knee to Chest  Lie on a firm bed or floor with both legs extended in front of you.  Keeping one leg in contact with the floor, bring your opposite knee to your chest.  Tense your stomach muscles to support your back and then lift your other knee to your chest. Hold your legs in place by either grabbing behind your thighs or at your knees.  Pull both knees toward your chest until you  feel a gentle stretch in your low back. Hold __________ seconds.  Tense your stomach muscles and slowly return one leg at a time to the floor. Repeat __________ times. Complete this exercise __________ times per day.  STRETCH - Low Trunk Rotation  Lie on a firm bed or floor. Keeping your legs in front of you, bend your knees so they are both pointed toward the ceiling and your feet are flat on the floor.  Extend your arms out to the side. This will stabilize your upper body by keeping your shoulders in contact with the floor.  Gently and slowly drop both knees together to one side until you feel a gentle stretch in your low back. Hold for __________ seconds.  Tense your stomach muscles to support your lower back as you bring your knees back to the starting position. Repeat the exercise to the other side. Repeat __________ times. Complete this exercise __________ times per day  EXTENSION RANGE OF MOTION AND FLEXIBILITY EXERCISES: STRETCH - Extension, Prone on Elbows   Lie on your stomach on the floor, a bed will be too soft. Place your palms about shoulder width apart and at the height of your head.  Place your elbows under your shoulders. If this is too painful, stack pillows under your chest.  Allow your body to relax so that your hips drop lower and make contact more completely with the floor.  Hold this position for __________ seconds.  Slowly return to lying flat on the floor. Repeat __________ times. Complete this exercise __________ times per day.  RANGE OF MOTION - Extension, Prone Press Ups  Lie on your stomach on the floor, a bed will be too soft. Place your palms about shoulder width apart and at the height of your head.  Keeping your back as relaxed as possible, slowly straighten your elbows while keeping your hips on the floor. You may adjust the placement of your hands to maximize your comfort. As you gain motion, your hands will come more underneath your  shoulders.  Hold this position __________ seconds.  Slowly return to lying flat on the floor. Repeat __________ times. Complete this exercise __________ times per day.  RANGE OF MOTION- Quadruped, Neutral Spine   Assume a hands and knees position on a firm surface. Keep your hands under your shoulders and your knees under your hips. You may place padding under your knees for comfort.  Drop your head and point your tailbone toward the ground below you. This will round out your lower back like an angry cat. Hold this position for __________ seconds.  Slowly lift your head and release your tail bone so that your back sags into a large arch, like an old horse.  Hold this position for __________ seconds.  Repeat this until you feel limber in your low back.  Now, find your "sweet spot." This will be the most comfortable position somewhere between the two previous positions. This is your neutral spine. Once you have  found this position, tense your stomach muscles to support your low back.  Hold this position for __________ seconds. Repeat __________ times. Complete this exercise __________ times per day.  STRENGTHENING EXERCISES - Low Back Sprain These exercises may help you when beginning to rehabilitate your injury. These exercises should be done near your "sweet spot." This is the neutral, low-back arch, somewhere between fully rounded and fully arched, that is your least painful position. When performed in this safe range of motion, these exercises can be used for people who have either a flexion or extension based injury. These exercises may resolve your symptoms with or without further involvement from your physician, physical therapist or athletic trainer. While completing these exercises, remember:   Muscles can gain both the endurance and the strength needed for everyday activities through controlled exercises.  Complete these exercises as instructed by your physician, physical therapist  or athletic trainer. Increase the resistance and repetitions only as guided.  You may experience muscle soreness or fatigue, but the pain or discomfort you are trying to eliminate should never worsen during these exercises. If this pain does worsen, stop and make certain you are following the directions exactly. If the pain is still present after adjustments, discontinue the exercise until you can discuss the trouble with your caregiver. STRENGTHENING - Deep Abdominals, Pelvic Tilt   Lie on a firm bed or floor. Keeping your legs in front of you, bend your knees so they are both pointed toward the ceiling and your feet are flat on the floor.  Tense your lower abdominal muscles to press your low back into the floor. This motion will rotate your pelvis so that your tail bone is scooping upwards rather than pointing at your feet or into the floor. With a gentle tension and even breathing, hold this position for __________ seconds. Repeat __________ times. Complete this exercise __________ times per day.  STRENGTHENING - Abdominals, Crunches   Lie on a firm bed or floor. Keeping your legs in front of you, bend your knees so they are both pointed toward the ceiling and your feet are flat on the floor. Cross your arms over your chest.  Slightly tip your chin down without bending your neck.  Tense your abdominals and slowly lift your trunk high enough to just clear your shoulder blades. Lifting higher can put excessive stress on the lower back and does not further strengthen your abdominal muscles.  Control your return to the starting position. Repeat __________ times. Complete this exercise __________ times per day.  STRENGTHENING - Quadruped, Opposite UE/LE Lift   Assume a hands and knees position on a firm surface. Keep your hands under your shoulders and your knees under your hips. You may place padding under your knees for comfort.  Find your neutral spine and gently tense your abdominal muscles  so that you can maintain this position. Your shoulders and hips should form a rectangle that is parallel with the floor and is not twisted.  Keeping your trunk steady, lift your right hand no higher than your shoulder and then your left leg no higher than your hip. Make sure you are not holding your breath. Hold this position for __________ seconds.  Continuing to keep your abdominal muscles tense and your back steady, slowly return to your starting position. Repeat with the opposite arm and leg. Repeat __________ times. Complete this exercise __________ times per day.  STRENGTHENING - Abdominals and Quadriceps, Straight Leg Raise   Lie on a firm bed  or floor with both legs extended in front of you.  Keeping one leg in contact with the floor, bend the other knee so that your foot can rest flat on the floor.  Find your neutral spine, and tense your abdominal muscles to maintain your spinal position throughout the exercise.  Slowly lift your straight leg off the floor about 6 inches for a count of 15, making sure to not hold your breath.  Still keeping your neutral spine, slowly lower your leg all the way to the floor. Repeat this exercise with each leg __________ times. Complete this exercise __________ times per day. POSTURE AND BODY MECHANICS CONSIDERATIONS - Low Back Sprain Keeping correct posture when sitting, standing or completing your activities will reduce the stress put on different body tissues, allowing injured tissues a chance to heal and limiting painful experiences. The following are general guidelines for improved posture. Your physician or physical therapist will provide you with any instructions specific to your needs. While reading these guidelines, remember:  The exercises prescribed by your provider will help you have the flexibility and strength to maintain correct postures.  The correct posture provides the best environment for your joints to work. All of your joints have  less wear and tear when properly supported by a spine with good posture. This means you will experience a healthier, less painful body.  Correct posture must be practiced with all of your activities, especially prolonged sitting and standing. Correct posture is as important when doing repetitive low-stress activities (typing) as it is when doing a single heavy-load activity (lifting). RESTING POSITIONS Consider which positions are most painful for you when choosing a resting position. If you have pain with flexion-based activities (sitting, bending, stooping, squatting), choose a position that allows you to rest in a less flexed posture. You would want to avoid curling into a fetal position on your side. If your pain worsens with extension-based activities (prolonged standing, working overhead), avoid resting in an extended position such as sleeping on your stomach. Most people will find more comfort when they rest with their spine in a more neutral position, neither too rounded nor too arched. Lying on a non-sagging bed on your side with a pillow between your knees, or on your back with a pillow under your knees will often provide some relief. Keep in mind, being in any one position for a prolonged period of time, no matter how correct your posture, can still lead to stiffness. PROPER SITTING POSTURE In order to minimize stress and discomfort on your spine, you must sit with correct posture. Sitting with good posture should be effortless for a healthy body. Returning to good posture is a gradual process. Many people can work toward this most comfortably by using various supports until they have the flexibility and strength to maintain this posture on their own. When sitting with proper posture, your ears will fall over your shoulders and your shoulders will fall over your hips. You should use the back of the chair to support your upper back. Your lower back will be in a neutral position, just slightly  arched. You may place a small pillow or folded towel at the base of your lower back for  support.  When working at a desk, create an environment that supports good, upright posture. Without extra support, muscles tire, which leads to excessive strain on joints and other tissues. Keep these recommendations in mind: CHAIR:  A chair should be able to slide under your desk when your  back makes contact with the back of the chair. This allows you to work closely.  The chair's height should allow your eyes to be level with the upper part of your monitor and your hands to be slightly lower than your elbows. BODY POSITION  Your feet should make contact with the floor. If this is not possible, use a foot rest.  Keep your ears over your shoulders. This will reduce stress on your neck and low back. INCORRECT SITTING POSTURES  If you are feeling tired and unable to assume a healthy sitting posture, do not slouch or slump. This puts excessive strain on your back tissues, causing more damage and pain. Healthier options include:  Using more support, like a lumbar pillow.  Switching tasks to something that requires you to be upright or walking.  Talking a brief walk.  Lying down to rest in a neutral-spine position. PROLONGED STANDING WHILE SLIGHTLY LEANING FORWARD  When completing a task that requires you to lean forward while standing in one place for a long time, place either foot up on a stationary 2-4 inch high object to help maintain the best posture. When both feet are on the ground, the lower back tends to lose its slight inward curve. If this curve flattens (or becomes too large), then the back and your other joints will experience too much stress, tire more quickly, and can cause pain. CORRECT STANDING POSTURES Proper standing posture should be assumed with all daily activities, even if they only take a few moments, like when brushing your teeth. As in sitting, your ears should fall over your  shoulders and your shoulders should fall over your hips. You should keep a slight tension in your abdominal muscles to brace your spine. Your tailbone should point down to the ground, not behind your body, resulting in an over-extended swayback posture.  INCORRECT STANDING POSTURES  Common incorrect standing postures include a forward head, locked knees and/or an excessive swayback. WALKING Walk with an upright posture. Your ears, shoulders and hips should all line-up. PROLONGED ACTIVITY IN A FLEXED POSITION When completing a task that requires you to bend forward at your waist or lean over a low surface, try to find a way to stabilize 3 out of 4 of your limbs. You can place a hand or elbow on your thigh or rest a knee on the surface you are reaching across. This will provide you more stability, so that your muscles do not tire as quickly. By keeping your knees relaxed, or slightly bent, you will also reduce stress across your lower back. CORRECT LIFTING TECHNIQUES DO :  Assume a wide stance. This will provide you more stability and the opportunity to get as close as possible to the object which you are lifting.  Tense your abdominals to brace your spine. Bend at the knees and hips. Keeping your back locked in a neutral-spine position, lift using your leg muscles. Lift with your legs, keeping your back straight.  Test the weight of unknown objects before attempting to lift them.  Try to keep your elbows locked down at your sides in order get the best strength from your shoulders when carrying an object.  Always ask for help when lifting heavy or awkward objects. INCORRECT LIFTING TECHNIQUES DO NOT:   Lock your knees when lifting, even if it is a small object.  Bend and twist. Pivot at your feet or move your feet when needing to change directions.  Assume that you can safely pick up  even a paperclip without proper posture.   This information is not intended to replace advice given to you  by your health care provider. Make sure you discuss any questions you have with your health care provider.   Document Released: 07/01/2005 Document Revised: 07/22/2014 Document Reviewed: 10/13/2008 [employer] Interactive Patient Education [employer].

## 2015-12-18 NOTE — Telephone Encounter (Signed)
Can you look at the referral for dermatology?

## 2015-12-18 NOTE — Progress Notes (Signed)
Subjective:    Patient ID: Kelsey Keller, female    DOB: August 31, 1940, 75 y.o.   MRN: [id number]   HUBERT PIETROPAOLO is a 75 y.o. female who presents today for an acute visit.    HPI Comments: Patient here for evaluation of right hip pain, worsening pain has been inconsistent for the last several months. Pain located right buttucks area. Has mild pain in left hip. Hip pain aggravated by long periods of sitting and sleeping on that side.  Uses a cane to walk and occasional walker at home. Pain improved with laying down. Patient states that she did fall twice during this spring while doing some yard work, believes she fell because of soft, unlevel ground. H/o LBP. On gabapentin for numbness and tingling on top of foot. No numbness tingling in buttucks, thighs. No saddle paraestheisa. Legs are giving out.   History of osteoporosis. H/o low back surgery 3 years ago.    Known heart murmur. She denies chest pain, dizziness, palpitations, shortness of breath.    Past Medical History  Diagnosis Date  . HYPOTHYROIDISM 04/29/2007  . DIABETES MELLITUS, TYPE II 06/23/2007  . HYPERLIPIDEMIA 04/29/2007  . HYPOKALEMIA 05/02/2009  . HYPERTENSION 04/29/2007  . ALLERGIC RHINITIS 04/29/2007  . OSTEOARTHRITIS, KNEE, LEFT 06/23/2007  . SPINAL STENOSIS, LUMBAR 04/29/2007  . LOW BACK PAIN 04/29/2007  . TOE PAIN 02/20/2010  . OSTEOPOROSIS NOS 05/12/2007  . HIATAL HERNIA, HX OF 04/29/2007   Allergies: Review of patient's allergies indicates no known allergies. Current Outpatient Prescriptions on File Prior to Visit  Medication Sig Dispense Refill  . alendronate (FOSAMAX) 70 MG tablet TAKE 1 TABLET ONCE A WEEK. TAKE WITH A FULL GLASS OF WATER ON AN EMPTY STOMACH. 12 tablet 3  . aspirin 81 MG EC tablet Take 81 mg by mouth daily.      Marland Kitchen atenolol-chlorthalidone (TENORETIC) 50-25 MG per tablet Take 1 tablet by mouth daily. 90 tablet 3  . calcium carbonate (TUMS EX) 750 MG chewable tablet Chew 1 tablet by mouth 2  (two) times daily.      . cetirizine (ZYRTEC) 10 MG tablet Take 1 tablet (10 mg total) by mouth daily. 90 tablet 3  . co-enzyme Q-10 30 MG capsule Take 100 mg by mouth daily.    . fluticasone (FLONASE) 50 MCG/ACT nasal spray Place 2 sprays into both nostrils daily. 48 g 3  . gabapentin (NEURONTIN) 600 MG tablet TAKE 1 TABLET THREE TIMES DAILY 270 tablet 2  . glimepiride (AMARYL) 4 MG tablet Take 1 tablet (4 mg total) by mouth 2 (two) times daily. 180 tablet 3  . glucose blood test strip Use as directed two times a day, diagnosis code E11.9 200 each 3  . HYDROcodone-homatropine (HYCODAN) 5-1.5 MG/5ML syrup Take 5 mLs by mouth every 6 (six) hours as needed for cough. 180 mL 0  . Lancets MISC Apply 1 application topically 2 (two) times daily. Use as directed two times a day, diagnosis code 250.00 200 each 3  . levothyroxine (SYNTHROID, LEVOTHROID) 150 MCG tablet Take 1 tablet (150 mcg total) by mouth daily. 90 tablet 3  . meloxicam (MOBIC) 15 MG tablet Take 1 tablet (15 mg total) by mouth daily. 90 tablet 3  . metFORMIN (GLUCOPHAGE) 1000 MG tablet Take 1 tablet (1,000 mg total) by mouth 2 (two) times daily with a meal. 180 tablet 3  . pioglitazone (ACTOS) 15 MG tablet Take 1 tablet (15 mg total) by mouth daily. 90 tablet 3  .  Red Yeast Rice 600 MG CAPS Take 600 mg by mouth 2 (two) times daily.     No current facility-administered medications on file prior to visit.    Social History  Substance Use Topics  . Smoking status: Never Smoker   . Smokeless tobacco: None  . Alcohol Use: 0.0 oz/week    0 Standard drinks or equivalent per week     Comment: One or two drinks in month    Review of Systems  Constitutional: Negative for fever and chills.  Respiratory: Negative for cough.   Cardiovascular: Negative for chest pain and palpitations.  Gastrointestinal: Negative for nausea and vomiting.  Musculoskeletal: Positive for back pain (chronic).  Neurological: Positive for numbness. Negative for  weakness.      Objective:    BP 170/80 mmHg  Pulse 76  Temp(Src) 97.9 F (36.6 C) (Oral)  Ht 5\' 6"  (1.676 m)  Wt 281 lb 4 oz (127.574 kg)  BMI 45.42 kg/m2  SpO2 94%   Physical Exam  Constitutional: She appears well-developed and well-nourished.  Eyes: Conjunctivae are normal.  Cardiovascular: Normal rate, regular rhythm and normal pulses.   Murmur heard.  Systolic murmur is present with a grade of 2/6  SEM II/VI, Loudest LSB, non radiating, no thrill   Pulmonary/Chest: Effort normal and breath sounds normal. She has no wheezes. She has no rhonchi. She has no rales.  Musculoskeletal:       Right hip: She exhibits tenderness. She exhibits normal range of motion, normal strength and no bony tenderness.       Left hip: She exhibits normal range of motion, normal strength, no tenderness and no bony tenderness.       Lumbar back: She exhibits normal range of motion, no tenderness, no bony tenderness, no edema, no pain and no spasm.       Back:       Legs: Using cane. Slight limp to gait. Bearing weight on both legs.  Unable to examine patient on exam table, she was in chair.   Scar noted midline thoracic.   Right hip: Pain noted posterior right buttocks with deep palpation. No pain with deep palpation of greater trochanter.   Negative single leg raise bilaterally.       Neurological: She is alert.  Skin: Skin is warm and dry.  Psychiatric: She has a normal mood and affect. Her speech is normal and behavior is normal. Thought content normal.  Vitals reviewed.      Assessment & Plan:   1. Right hip pain Working diagnosis of right hip pain possible SI joint etiology. Patient is not having any radicular symptoms at this time. She has a long history of debilitating low back pain and surgery. We discussed multiple options today including joint injections with sports medicine, and trial of topical agents. Pending films of lumbar spine and bilateral hips and pelvis to evaluate  hardware and for any occult fractures due to her recent falls.  - DG Lumbar Spine 2-3 Views; Future - DG HIPS BILAT WITH PELVIS 3-4 VIEWS; Future - diclofenac sodium (VOLTAREN) 1 % GEL; Apply 4 g topically 4 (four) times daily.  Dispense: 1 Tube; Refill: 3 - Ambulatory referral to Sports Medicine  Note- advised patient to call insurance company to see which glucometer they will cover.  I have discontinued Ms. Faciane's azithromycin. I am also having her maintain her aspirin, calcium carbonate, Lancets, Red Yeast Rice, co-enzyme Q-10, atenolol-chlorthalidone, cetirizine, fluticasone, levothyroxine, meloxicam, HYDROcodone-homatropine, glimepiride, metFORMIN, pioglitazone, glucose blood,  alendronate, and gabapentin.   No orders of the defined types were placed in this encounter.     Start medications as prescribed and explained to patient on After Visit Summary ( AVS). Risks, benefits, and alternatives of the medications and treatment plan prescribed today were discussed, and patient expressed understanding.   Education regarding symptom management and diagnosis given to patient.   Follow-up:Plan follow-up and return precautions given if any worsening symptoms or change in condition.   Continue to follow with Cathlean Cower, MD for routine health maintenance.   Kelsey Keller and I agreed with plan.   Mable Paris, FNP

## 2015-12-19 ENCOUNTER — Ambulatory Visit (INDEPENDENT_AMBULATORY_CARE_PROVIDER_SITE_OTHER)
Admission: RE | Admit: 2015-12-19 | Discharge: 2015-12-19 | Disposition: A | Discharge status: Home / Self Care | Payer: Commercial Managed Care - HMO | Source: Ambulatory Visit | Attending: Family | Admitting: Family

## 2015-12-19 ENCOUNTER — Other Ambulatory Visit: Payer: Self-pay | Admitting: *Deleted

## 2015-12-19 ENCOUNTER — Encounter: Payer: Self-pay | Admitting: Family

## 2015-12-19 DIAGNOSIS — M25551 Pain in right hip: Secondary | ICD-10-CM | POA: Diagnosis not present

## 2015-12-19 MED ORDER — ACCU-CHEK SOFTCLIX LANCETS MISC: 1.0000 | Freq: Two times a day (BID) | Status: DC

## 2015-12-19 MED ORDER — ACCU-CHEK AVIVA PLUS W/DEVICE KIT: PACK | Status: DC

## 2015-12-19 MED ORDER — GLUCOSE BLOOD VI STRP: 1.0000 | ORAL_STRIP | Freq: Two times a day (BID) | Status: DC

## 2015-12-19 MED ORDER — ACCU-CHEK COMPACT PLUS CONTROL VI SOLN: Status: DC

## 2015-12-19 NOTE — Telephone Encounter (Signed)
appt made and pt aware.

## 2015-12-20 NOTE — Telephone Encounter (Signed)
Informed pt that this has been done.

## 2016-01-02 ENCOUNTER — Ambulatory Visit (INDEPENDENT_AMBULATORY_CARE_PROVIDER_SITE_OTHER): Payer: Commercial Managed Care - HMO | Admitting: Family Medicine

## 2016-01-02 VITALS — BP 142/82 | HR 82 | Wt 281.0 lb

## 2016-01-02 DIAGNOSIS — T84498A Other mechanical complication of other internal orthopedic devices, implants and grafts, initial encounter: Secondary | ICD-10-CM | POA: Insufficient documentation

## 2016-01-02 DIAGNOSIS — M4806 Spinal stenosis, lumbar region: Secondary | ICD-10-CM | POA: Diagnosis not present

## 2016-01-02 DIAGNOSIS — M519 Unspecified thoracic, thoracolumbar and lumbosacral intervertebral disc disorder: Secondary | ICD-10-CM | POA: Diagnosis not present

## 2016-01-02 DIAGNOSIS — M48061 Spinal stenosis, lumbar region without neurogenic claudication: Secondary | ICD-10-CM

## 2016-01-02 DIAGNOSIS — E669 Obesity, unspecified: Secondary | ICD-10-CM

## 2016-01-02 MED ORDER — GABAPENTIN 300 MG PO CAPS
ORAL_CAPSULE | ORAL | Status: DC
Start: 2016-01-02 — End: 2016-01-02

## 2016-01-02 MED ORDER — VITAMIN D (ERGOCALCIFEROL) 1.25 MG (50000 UNIT) PO CAPS: 50000.0000 [IU] | ORAL_CAPSULE | ORAL | Status: DC

## 2016-01-02 MED ORDER — GABAPENTIN 300 MG PO CAPS: ORAL_CAPSULE | ORAL | Status: DC

## 2016-01-02 NOTE — Assessment & Plan Note (Signed)
Based on patient's symptoms as well as imaging I do believe the patient does have loosening of the hardware in her spine. It does appear that her S1 screws seems to be loosening. This is consistent with patient's symptoms as well as physical findings today. We discussed with patient at great length. She has been on high-dose a gabapentin that has helped but unfortunately is making her son elected at the day. We will change her dosing to 300 mg in the a.m., 300 mg in the p.m., and 600 mg at night. We discussed with patient about possible need for prednisone in the future if continuing to have some difficulty. This would be only a short course. Once weekly vitamin D also prescribed to help with stabilization of the bone and try to prevent the worsening osteoporosis and osteoarthritic changes in the back. Patient encouraged to follow-up with her surgeon in the near future the patient wants to go on a cruise in July and wait until afterwards when she would have any surgical intervention. Patient does not have any bowel or bladder trouble and very minimal weakness in the leg she states. If worsening symptoms she knows to seek medical attention immediately. We will see patient back again in 3-4 weeks to make sure she is responding as well as give her enough medications to help her through her trip.

## 2016-01-02 NOTE — Patient Instructions (Signed)
Good to see you  Heat is ok but Ice is better afterward.  Tylenol 500mg  3 times a day is best medicine for arthritis  Turmeric 500mg  1-2 times daily  Tart cherry extract at night New prescriptions Gabapentin 300mg  in AM, midday and 600mg  at night Once weekly vitaminD for next 12 weeks See me again in 4 weeks right before your trip to make sure you are better and see if some injections or other medicines can help

## 2016-01-02 NOTE — Progress Notes (Signed)
[facility] [place], [place] [address] Phone: [phone] Subjective:    I'm seeing this patient by the request  of:  Cathlean Cower, MD   CC: Back pain  [id number] Kelsey Keller is a 75 y.o. female coming in with complaint of back pain. Patient has had a long history of back pain including the spinal stenosis. Patient was having back pain as well as bilateral hip pain and tone of the provider. At that time x-rays were ordered. X-rays were independently visualized by me. It does appear the patient did have a posterior lumbar interbody fusion from T10-S1. When reviewing the images it does appear that the inferior aspect of the fusion does have lucency around the bilateral S1 as well as the right L5. Patient also had a CT scan in February 2017 that did show progressive hardware loosening even at that time. Patient did see her neurosurgery and an was to follow-up with him in September. Patient states unfortunately that the pain seems to be worsening. States that certain days she can be in severe pain that she cannot get out of bed and an another day she seems to do quite well. Patient states that sometimes there is intermittent pain going down the leg but not significantly severe. Patient denies any fever, chills, any abnormal weight loss. Patient states that she can be doing certain activities and do fine and then all of a sudden have a sharp stabbing pain on the lower aspect of the back mostly on the right side. States that she'll most feels that there is something loose or in her lower back. Rates the severity of pain when it occurs as 9 out of 10. No pain medications a seem to be beneficial at this time. Denies any recent injuries.     Past Medical History  Diagnosis Date  . HYPOTHYROIDISM 04/29/2007  . DIABETES MELLITUS, TYPE II 06/23/2007  . HYPERLIPIDEMIA 04/29/2007  . HYPOKALEMIA 05/02/2009  . HYPERTENSION 04/29/2007  . ALLERGIC RHINITIS  04/29/2007  . OSTEOARTHRITIS, KNEE, LEFT 06/23/2007  . SPINAL STENOSIS, LUMBAR 04/29/2007  . LOW BACK PAIN 04/29/2007  . TOE PAIN 02/20/2010  . OSTEOPOROSIS NOS 05/12/2007  . HIATAL HERNIA, HX OF 04/29/2007   Past Surgical History  Procedure Laterality Date  . Total knee arthroplasty  2006    Right  . Total knee arthroplasty  2006    right  . Cataract extraction w/ intraocular lens  implant, bilateral Bilateral 05/2011    doing well  . Back surgery N/A 2014    Lower back? not sure of procedure, will get medical records for practice   Social History   Social History  . Marital Status: Divorced    Spouse Name: N/A  . Number of Children: N/A  . Years of Education: N/A   Occupational History  . teller at [employer]    Social History Main Topics  . Smoking status: Never Smoker   . Smokeless tobacco: Not on file  . Alcohol Use: 0.0 oz/week    0 Standard drinks or equivalent per week     Comment: One or two drinks in month  . Drug Use: No  . Sexual Activity: Not on file   Other Topics Concern  . Not on file   Social History Narrative   No Known Allergies Family History  Problem Relation Age of Onset  . Coronary artery disease Other     Female 1st degree relative  . Diabetes Other  1st degree relative  . Arthritis Mother   . Hypertension Mother   . Coronary artery disease Father   . Arthritis Brother   . Stroke Brother   . Hyperlipidemia Brother   . COPD Brother     Past medical history, social, surgical and family history all reviewed in electronic medical record.  No pertanent information unless stated regarding to the chief complaint.   Review of Systems: No headache, visual changes, nausea, vomiting, diarrhea, constipation, dizziness, abdominal pain, skin rash, fevers, chills, night sweats, weight loss, swollen lymph nodes, body aches, joint swelling, muscle aches, chest pain, shortness of breath, mood changes.   Objective Blood pressure 142/82, pulse 82, weight  281 lb (127.461 kg).  General: No apparent distress alert and oriented x3 mood and affect normal, dressed appropriately.  HEENT: Pupils equal, extraocular movements intact  Respiratory: Patient's speak in full sentences and does not appear short of breath  Cardiovascular: No lower extremity edema, non tender, no erythema  Skin: Warm dry intact with no signs of infection or rash on extremities or on axial skeleton.  Abdomen: Soft nontender  Neuro: Cranial nerves II through XII are intact, neurovascularly intact in all extremities with 2+ DTRs and 2+ pulses.  Lymph: No lymphadenopathy of posterior or anterior cervical chain or axillae bilaterally.  Gait Antalgic gait and ambulates with the aid of a cane MSK:  Non tender with full range of motion and good stability and symmetric strength and tone of shoulders, elbows, wrist, hip, knee and ankles bilaterally. Significant arthritic changes of multiple joints. Patient has had bilateral knee replacements noted. Back Exam:  Inspection: Unremarkable patient is morbidly obese Motion: Flexion 15 deg, Extension 55 deg, Side Bending to 25 deg bilaterally,  Rotation to 15 deg bilaterally  SLR laying: Causes severe pain in the sacral area. XSLR laying: Negative  Palpable tenderness: Severe discomfort in the paraspinal musculature as well as over the iliosacral joints bilaterally. FABER: Unable to do secondary to stiffness and pain as well as patient having bilateral knee replacement. Sensory change: Gross sensation intact to all lumbar and sacral dermatomes.  Reflexes: 2+ at both patellar tendons, 2+ at achilles tendons, Babinski's downgoing.  Strength at foot  Plantar-flexion: 5/5 Dorsi-flexion: 5/5 Eversion: 5/5 Inversion: 5/5  Leg strength  4 out of 5 but seems to be symmetric.    Impression and Recommendations:     This case required medical decision making of moderate complexity.      Note: This dictation was prepared with Dragon dictation  along with smaller phrase technology. Any transcriptional errors that result from this process are unintentional.

## 2016-01-03 LAB — HM DIABETES EYE EXAM

## 2016-01-10 ENCOUNTER — Other Ambulatory Visit (INDEPENDENT_AMBULATORY_CARE_PROVIDER_SITE_OTHER): Payer: Commercial Managed Care - HMO

## 2016-01-10 DIAGNOSIS — E118 Type 2 diabetes mellitus with unspecified complications: Secondary | ICD-10-CM

## 2016-01-10 LAB — BASIC METABOLIC PANEL
BUN: 22 mg/dL (ref 6–23)
CALCIUM: 10.6 mg/dL — AB (ref 8.4–10.5)
CHLORIDE: 101 meq/L (ref 96–112)
CO2: 28 meq/L (ref 19–32)
Creatinine, Ser: 0.98 mg/dL (ref 0.40–1.20)
GFR: 59.09 mL/min — ABNORMAL LOW (ref >60.00)
Glucose, Bld: 134 mg/dL — ABNORMAL HIGH (ref 70–99)
POTASSIUM: 4 meq/L (ref 3.5–5.1)
SODIUM: 137 meq/L (ref 135–145)

## 2016-01-10 LAB — HEPATIC FUNCTION PANEL
ALK PHOS: 74 U/L (ref 39–117)
ALT: 31 U/L (ref 0–35)
AST: 44 U/L — ABNORMAL HIGH (ref 0–37)
Albumin: 4.1 g/dL (ref 3.5–5.2)
BILIRUBIN DIRECT: 0.2 mg/dL (ref 0.0–0.3)
TOTAL PROTEIN: 7.1 g/dL (ref 6.0–8.3)
Total Bilirubin: 0.9 mg/dL (ref 0.2–1.2)

## 2016-01-10 LAB — LIPID PANEL
Cholesterol: 161 mg/dL (ref 0–200)
HDL: 62.2 mg/dL (ref >39.00)
LDL Cholesterol: 68 mg/dL (ref 0–99)
NonHDL: 98.31
Total CHOL/HDL Ratio: 3
Triglycerides: 152 mg/dL — ABNORMAL HIGH (ref 0.0–149.0)
VLDL: 30.4 mg/dL (ref 0.0–40.0)

## 2016-01-10 LAB — HEMOGLOBIN A1C: HEMOGLOBIN A1C: 6.9 % — AB (ref 4.6–6.5)

## 2016-01-12 ENCOUNTER — Encounter: Payer: Self-pay | Admitting: Internal Medicine

## 2016-01-17 ENCOUNTER — Encounter: Payer: Self-pay | Admitting: Internal Medicine

## 2016-01-17 ENCOUNTER — Ambulatory Visit (INDEPENDENT_AMBULATORY_CARE_PROVIDER_SITE_OTHER)
Admission: RE | Admit: 2016-01-17 | Discharge: 2016-01-17 | Disposition: A | Discharge status: Home / Self Care | Payer: Commercial Managed Care - HMO | Source: Ambulatory Visit | Attending: Internal Medicine | Admitting: Internal Medicine

## 2016-01-17 ENCOUNTER — Telehealth: Payer: Self-pay

## 2016-01-17 ENCOUNTER — Ambulatory Visit (INDEPENDENT_AMBULATORY_CARE_PROVIDER_SITE_OTHER): Payer: Commercial Managed Care - HMO | Admitting: Internal Medicine

## 2016-01-17 VITALS — BP 140/78 | HR 74 | Temp 98.1°F | Resp 20 | Wt 284.0 lb

## 2016-01-17 DIAGNOSIS — I10 Essential (primary) hypertension: Secondary | ICD-10-CM | POA: Diagnosis not present

## 2016-01-17 DIAGNOSIS — E559 Vitamin D deficiency, unspecified: Secondary | ICD-10-CM | POA: Insufficient documentation

## 2016-01-17 DIAGNOSIS — M25571 Pain in right ankle and joints of right foot: Secondary | ICD-10-CM

## 2016-01-17 DIAGNOSIS — E785 Hyperlipidemia, unspecified: Secondary | ICD-10-CM

## 2016-01-17 DIAGNOSIS — Z0001 Encounter for general adult medical examination with abnormal findings: Secondary | ICD-10-CM

## 2016-01-17 DIAGNOSIS — E118 Type 2 diabetes mellitus with unspecified complications: Secondary | ICD-10-CM | POA: Diagnosis not present

## 2016-01-17 DIAGNOSIS — T84498A Other mechanical complication of other internal orthopedic devices, implants and grafts, initial encounter: Secondary | ICD-10-CM

## 2016-01-17 DIAGNOSIS — R6889 Other general symptoms and signs: Secondary | ICD-10-CM

## 2016-01-17 MED ORDER — METFORMIN HCL 1000 MG PO TABS: 1000.0000 mg | ORAL_TABLET | Freq: Two times a day (BID) | ORAL | Status: DC

## 2016-01-17 MED ORDER — PIOGLITAZONE HCL 15 MG PO TABS: 15.0000 mg | ORAL_TABLET | Freq: Every day | ORAL | Status: DC

## 2016-01-17 MED ORDER — ALENDRONATE SODIUM 70 MG PO TABS: ORAL_TABLET | ORAL | Status: DC

## 2016-01-17 MED ORDER — ATENOLOL-CHLORTHALIDONE 50-25 MG PO TABS: 1.0000 | ORAL_TABLET | Freq: Every day | ORAL | Status: DC

## 2016-01-17 MED ORDER — CETIRIZINE HCL 10 MG PO TABS: 10.0000 mg | ORAL_TABLET | Freq: Every day | ORAL | Status: DC

## 2016-01-17 MED ORDER — FLUTICASONE PROPIONATE 50 MCG/ACT NA SUSP: 2.0000 | Freq: Every day | NASAL | Status: DC

## 2016-01-17 MED ORDER — GABAPENTIN 300 MG PO CAPS: ORAL_CAPSULE | ORAL | Status: DC

## 2016-01-17 MED ORDER — LEVOTHYROXINE SODIUM 150 MCG PO TABS: 150.0000 ug | ORAL_TABLET | Freq: Every day | ORAL | Status: DC

## 2016-01-17 MED ORDER — GLIMEPIRIDE 4 MG PO TABS: 4.0000 mg | ORAL_TABLET | Freq: Two times a day (BID) | ORAL | Status: DC

## 2016-01-17 MED ORDER — MELOXICAM 15 MG PO TABS: 15.0000 mg | ORAL_TABLET | Freq: Every day | ORAL | Status: DC

## 2016-01-17 NOTE — Telephone Encounter (Signed)
Medication refills sent to pharmacy 

## 2016-01-17 NOTE — Progress Notes (Signed)
Subjective:    Patient ID: Kelsey Keller, female    DOB: [birth year], 75 y.o.   MRN: [id number]  HPI  Here to f/u; overall doing ok,  Pt denies chest pain, increasing sob or doe, wheezing, orthopnea, PND, increased LE swelling, palpitations, dizziness or syncope.  Pt denies new neurological symptoms such as new headache, or facial or extremity weakness or numbness.  Pt denies polydipsia, polyuria, or low sugar episode.   Pt denies new neurological symptoms such as new headache, or facial or extremity weakness or numbness.   Pt states overall good compliance with meds, mostly trying to follow appropriate diet, with wt overall stable,  but little exercise however. Wt Readings from Last 3 Encounters:  01/17/16 284 lb (128.822 kg)  01/02/16 281 lb (127.461 kg)  12/18/15 281 lb 4 oz (127.574 kg)   Has appt with NS for Aug 15 for recent findings LBP with loosening screw x 2, s/p lumbar surgury x 2, last one approx 3 yrs ago.  Has plans to f/u with Dr Smith/sport med soon July 13. Has not been using the volt cream after she read about bleeding in the side effect  Also incidentally with right ankle pain, without swelling or falls  Had recent minor elev Ca, and hx of Vit D deficiency Past Medical History  Diagnosis Date  . HYPOTHYROIDISM 04/29/2007  . DIABETES MELLITUS, TYPE II 06/23/2007  . HYPERLIPIDEMIA 04/29/2007  . HYPOKALEMIA 05/02/2009  . HYPERTENSION 04/29/2007  . ALLERGIC RHINITIS 04/29/2007  . OSTEOARTHRITIS, KNEE, LEFT 06/23/2007  . SPINAL STENOSIS, LUMBAR 04/29/2007  . LOW BACK PAIN 04/29/2007  . TOE PAIN 02/20/2010  . OSTEOPOROSIS NOS 05/12/2007  . HIATAL HERNIA, HX OF 04/29/2007   Past Surgical History  Procedure Laterality Date  . Total knee arthroplasty  2006    Right  . Total knee arthroplasty  2006    right  . Cataract extraction w/ intraocular lens  implant, bilateral Bilateral 05/2011    doing well  . Back surgery N/A 2014    Lower back? not sure of procedure, will  get medical records for practice    reports that she has never smoked. She does not have any smokeless tobacco history on file. She reports that she drinks alcohol. She reports that she does not use illicit drugs. family history includes Arthritis in her brother and mother; COPD in her brother; Coronary artery disease in her father and other; Diabetes in her other; Hyperlipidemia in her brother; Hypertension in her mother; Stroke in her brother. No Known Allergies Current Outpatient Prescriptions on File Prior to Visit  Medication Sig Dispense Refill  . ACCU-CHEK SOFTCLIX LANCETS lancets 1 each by Other route 2 (two) times daily. Use to help check blood sugars twice a day Dx e11.9 100 each 3  . aspirin 81 MG EC tablet Take 81 mg by mouth daily.      . Blood Glucose Calibration (ACCU-CHEK COMPACT PLUS CONTROL) SOLN Use as directed Dx E11.9 3 each 3  . Blood Glucose Monitoring Suppl (ACCU-CHEK AVIVA PLUS) w/Device KIT Use as directed to check blood sugars everyday Dx e11.9 1 kit 0  . calcium carbonate (TUMS EX) 750 MG chewable tablet Chew 1 tablet by mouth 2 (two) times daily.      Marland Kitchen co-enzyme Q-10 30 MG capsule Take 100 mg by mouth daily.    Marland Kitchen glucose blood (ACCU-CHEK AVIVA PLUS) test strip 1 each by Other route 2 (two) times daily. Use to check blood sugars twice  a day Dx e11.9 300 each 3  . glucose blood test strip Use as directed two times a day, diagnosis code E11.9 200 each 3  . HYDROcodone-homatropine (HYCODAN) 5-1.5 MG/5ML syrup Take 5 mLs by mouth every 6 (six) hours as needed for cough. 180 mL 0  . Red Yeast Rice 600 MG CAPS Take 600 mg by mouth 2 (two) times daily.    . Vitamin D, Ergocalciferol, (DRISDOL) 50000 units CAPS capsule Take 1 capsule (50,000 Units total) by mouth every 7 (seven) days. 12 capsule 0   No current facility-administered medications on file prior to visit.   Review of Systems  Constitutional: Negative for unusual diaphoresis or night sweats HENT: Negative  for ear swelling or discharge Eyes: Negative for worsening visual haziness  Respiratory: Negative for choking and stridor.   Gastrointestinal: Negative for distension or worsening eructation Genitourinary: Negative for retention or change in urine volume.  Musculoskeletal: Negative for other MSK pain or swelling Skin: Negative for color change and worsening wound Neurological: Negative for tremors and numbness other than noted  Psychiatric/Behavioral: Negative for decreased concentration or agitation other than above       Objective:   Physical Exam BP 140/78 mmHg  Pulse 74  Temp(Src) 98.1 F (36.7 C) (Oral)  Resp 20  Wt 284 lb (128.822 kg)  SpO2 94% VS noted,  Constitutional: Pt appears in no apparent distress HENT: Head: NCAT.  Right Ear: External ear normal.  Left Ear: External ear normal.  Eyes: . Pupils are equal, round, and reactive to light. Conjunctivae and EOM are normal Neck: Normal range of motion. Neck supple.  Cardiovascular: Normal rate and regular rhythm.   Pulmonary/Chest: Effort normal and breath sounds without rales or wheezing.  Abd:  Soft, NT, ND, + BS Neurological: Pt is alert. Not confused , motor grossly intact Skin: Skin is warm. No rash, no LE edema Psychiatric: Pt behavior is normal. No agitation.  Right ankle with small effusion, NT but decreased ROM  December 19 2015 LS Spine films IMPRESSION: Posterior lumbar interbody fusion from T10 through S1. Very screw lucency around bilateral S1 screws and the right L5 screw consistent with loosening. Appearance is similar to the prior exam of 09/08/2015.   Electronically Signed  By: Kathreen Devoid  On: 12/19/2015 11:19    Assessment & Plan:

## 2016-01-17 NOTE — Patient Instructions (Addendum)
OK to take the Voltaren as directed; you can also try this for the right ankle as well as the lower back  Please continue all other medications as before, and refills have been done if requested.  Please have the pharmacy call with any other refills you may need.  Please continue your efforts at being more active, low cholesterol diet, and weight control.  You are otherwise up to date with prevention measures today.  Please keep your appointments with your specialists as you may have planned - Dr Tamala Julian on July 13, and Neurosurgury on Aug 13  Please go to the XRAY Department in the Basement (go straight as you get off the elevator) for the x-ray testing  You will be contacted by phone if any changes need to be made immediately.  Otherwise, you will receive a letter about your results with an explanation, but please check with MyChart first.  Please remember to sign up for MyChart if you have not done so, as this will be important to you in the future with finding out test results, communicating by private email, and scheduling acute appointments online when needed.  Please return in 6 months, or sooner if needed, with Lab testing done 3-5 days before

## 2016-01-17 NOTE — Progress Notes (Signed)
Pre visit review using our clinic review tool, if applicable. No additional management support is needed unless otherwise documented below in the visit note. 

## 2016-01-18 ENCOUNTER — Ambulatory Visit
Admission: RE | Admit: 2016-01-18 | Discharge: 2016-01-18 | Disposition: A | Discharge status: Home / Self Care | Payer: Commercial Managed Care - HMO | Source: Ambulatory Visit | Attending: Internal Medicine | Admitting: Internal Medicine

## 2016-01-18 DIAGNOSIS — Z1231 Encounter for screening mammogram for malignant neoplasm of breast: Secondary | ICD-10-CM

## 2016-01-22 NOTE — Assessment & Plan Note (Addendum)
stable overall by history and exam, recent data reviewed with pt, and pt to continue medical treatment as before,  to f/u any worsening symptoms or concerns Lab Results  Component Value Date   HGBA1C 6.9* 01/10/2016   Note:  Total time for pt hx, exam, review of record with pt in the room, determination of diagnoses and plan for further eval and tx is > 40 min, with over 50% spent in coordination and counseling of patient

## 2016-01-22 NOTE — Assessment & Plan Note (Signed)
stable overall by history and exam, recent data reviewed with pt, and pt to continue medical treatment as before,  to f/u any worsening symptoms or concerns Lab Results  Component Value Date   LDLCALC 68 01/10/2016

## 2016-01-22 NOTE — Assessment & Plan Note (Signed)
For f/u ortho as planned 

## 2016-01-22 NOTE — Assessment & Plan Note (Signed)
Mild, likely OA?, asked pt to f/u with Dr Smith/sports med

## 2016-01-22 NOTE — Assessment & Plan Note (Signed)
For PTH and f/u calcium,  to f/u any worsening symptoms or concerns

## 2016-01-22 NOTE — Assessment & Plan Note (Signed)
stable overall by history and exam, recent data reviewed with pt, and pt to continue medical treatment as before,  to f/u any worsening symptoms or concerns BP Readings from Last 3 Encounters:  01/17/16 140/78  01/02/16 142/82  12/18/15 170/80

## 2016-01-22 NOTE — Assessment & Plan Note (Signed)
Also for vit D f/u,  to f/u any worsening symptoms or concerns

## 2016-01-25 ENCOUNTER — Encounter: Payer: Self-pay | Admitting: Family Medicine

## 2016-01-25 ENCOUNTER — Ambulatory Visit (INDEPENDENT_AMBULATORY_CARE_PROVIDER_SITE_OTHER): Payer: Commercial Managed Care - HMO | Admitting: Family Medicine

## 2016-01-25 VITALS — BP 130/80 | HR 62 | Ht 66.0 in | Wt 279.0 lb

## 2016-01-25 DIAGNOSIS — T84498A Other mechanical complication of other internal orthopedic devices, implants and grafts, initial encounter: Secondary | ICD-10-CM | POA: Diagnosis not present

## 2016-01-25 DIAGNOSIS — M25571 Pain in right ankle and joints of right foot: Secondary | ICD-10-CM | POA: Diagnosis not present

## 2016-01-25 MED ORDER — TRAMADOL HCL 50 MG PO TABS: 50.0000 mg | ORAL_TABLET | Freq: Every evening | ORAL | Status: DC | PRN

## 2016-01-25 MED ORDER — PREDNISONE 20 MG PO TABS: 40.0000 mg | ORAL_TABLET | Freq: Every day | ORAL | Status: DC

## 2016-01-25 NOTE — Progress Notes (Signed)
[facility] [place], [place] [address] Phone: [phone] Subjective:    I'm seeing this patient by the request  of:  Cathlean Cower, MD   CC: Back pain Follow-up  [id number] Kelsey Keller is a 75 y.o. female coming in with complaint of back pain. Patient has had a long history of back pain including the spinal stenosis. Patient was having back pain as well as bilateral hip pain and tone of the provider. At that time x-rays were ordered. X-rays were independently visualized by me. It does appear the patient did have a posterior lumbar interbody fusion from T10-S1. When reviewing the images it does appear that the inferior aspect of the fusion does have lucency around the bilateral S1 as well as the right L5. Patient also had a CT scan in February 2017 that did show progressive hardware loosening even at that time. Patient did see her neurosurgery and an was to follow-up with him in September. Patient was given some medicine, was doing icing protocol, patient was to do some mild range of motion of her back. Patient has not notice any significant improvement. Patient is leaving for a trip for the next week. Patient when she gets back we'll have an appointment with the surgeon August 13. Patient is also complaining of right ankle pain. States that she is feeling some mild weakness. States that if she walks longer than 200 feet she has severe pain. Using a cane to help her with ambulation.     Past Medical History  Diagnosis Date  . HYPOTHYROIDISM 04/29/2007  . DIABETES MELLITUS, TYPE II 06/23/2007  . HYPERLIPIDEMIA 04/29/2007  . HYPOKALEMIA 05/02/2009  . HYPERTENSION 04/29/2007  . ALLERGIC RHINITIS 04/29/2007  . OSTEOARTHRITIS, KNEE, LEFT 06/23/2007  . SPINAL STENOSIS, LUMBAR 04/29/2007  . LOW BACK PAIN 04/29/2007  . TOE PAIN 02/20/2010  . OSTEOPOROSIS NOS 05/12/2007  . HIATAL HERNIA, HX OF 04/29/2007   Past Surgical History  Procedure  Laterality Date  . Total knee arthroplasty  2006    Right  . Total knee arthroplasty  2006    right  . Cataract extraction w/ intraocular lens  implant, bilateral Bilateral 05/2011    doing well  . Back surgery N/A 2014    Lower back? not sure of procedure, will get medical records for practice   Social History   Social History  . Marital Status: Divorced    Spouse Name: N/A  . Number of Children: N/A  . Years of Education: N/A   Occupational History  . teller at [employer]    Social History Main Topics  . Smoking status: Never Smoker   . Smokeless tobacco: None  . Alcohol Use: 0.0 oz/week    0 Standard drinks or equivalent per week     Comment: One or two drinks in month  . Drug Use: No  . Sexual Activity: Not Asked   Other Topics Concern  . None   Social History Narrative   No Known Allergies Family History  Problem Relation Age of Onset  . Coronary artery disease Other     Female 1st degree relative  . Diabetes Other     1st degree relative  . Arthritis Mother   . Hypertension Mother   . Coronary artery disease Father   . Arthritis Brother   . Stroke Brother   . Hyperlipidemia Brother   . COPD Brother     Past medical history, social, surgical and family history all  reviewed in electronic medical record.  No pertanent information unless stated regarding to the chief complaint.   Review of Systems: No headache, visual changes, nausea, vomiting, diarrhea, constipation, dizziness, abdominal pain, skin rash, fevers, chills, night sweats, weight loss, swollen lymph nodes, body aches, joint swelling, muscle aches, chest pain, shortness of breath, mood changes.   Objective Blood pressure 130/80, pulse 62, height 5\' 6"  (1.676 m), weight 279 lb (126.554 kg), SpO2 96 %.  General: No apparent distress alert and oriented x3 mood and affect normal, dressed appropriately.  HEENT: Pupils equal, extraocular movements intact  Respiratory: Patient's speak in full sentences and  does not appear short of breath  Cardiovascular: No lower extremity edema, non tender, no erythema  Skin: Warm dry intact with no signs of infection or rash on extremities or on axial skeleton.  Abdomen: Soft nontender  Neuro: Cranial nerves II through XII are intact, neurovascularly intact in all extremities with 2+ DTRs and 2+ pulses.  Lymph: No lymphadenopathy of posterior or anterior cervical chain or axillae bilaterally.  Gait Antalgic gait and ambulates with the aid of a cane MSK:  Non tender with full range of motion and good stability and symmetric strength and tone of shoulders, elbows, wrist, hip, knees bilaterally. Significant arthritic changes of multiple joints. Patient has had bilateral knee replacements noted. Back Exam:  Inspection: Unremarkable patient is morbidly obese Motion: Flexion 15 deg, Extension 55 deg, Side Bending to 25 deg bilaterally,  Rotation to 15 deg bilaterally  SLR laying: Causes severe pain in the sacral area. XSLR laying: Negative  Palpable tenderness: Severe discomfort in the paraspinal musculature as well as over the iliosacral joints bilaterally. FABER: Unable to do secondary to stiffness and pain as well as patient having bilateral knee replacement. Sensory change: Gross sensation intact to all lumbar and sacral dermatomes.  Reflexes: 2+ at both patellar tendons, 2+ at achilles tendons, Babinski's downgoing.  Strength at foot  Plantar-flexion: 5/5 Dorsi-flexion: 5/5 Eversion: 5/5 Inversion: 5/5  Leg strength  4 out of 5 but seems to be symmetric. No change from previous exam  Ankle exam shows patient does have arthritic changes bilaterally. Patient's left ankle seems to be tender over the lateral aspect of the ankle. Just inferior and anterior to the lateral malleolus. No pain over the malleolus itself. Patient does have tightness of the deltoid ligament. Mild crepitus with range of motion. Mild weakness on the right side compared to the contralateral  side.  Procedure note [id number]; 15 minutes spent for Therapeutic exercises as stated in above notes.  This included exercises focusing on stretching, strengthening, with significant focus on eccentric aspects.  Ankle strengthening that included:  Basic range of motion exercises to allow proper full motion at ankle Stretching of the lower leg and hamstrings  Theraband exercises for the lower leg - inversion, eversion, dorsiflexion and plantarflexion each to be completed with a theraband Balance exercises to increase proprioception Weight bearing exercises to increase strength and balance  Proper technique shown and discussed handout in great detail with ATC.  All questions were discussed and answered.     Impression and Recommendations:     This case required medical decision making of moderate complexity.      Note: This dictation was prepared with Dragon dictation along with smaller phrase technology. Any transcriptional errors that result from this process are unintentional.

## 2016-01-25 NOTE — Assessment & Plan Note (Signed)
Once again discussed with patient at great length. We discussed which activities to avoid. We discussed which symptoms should make her seek medical attention immediately. Patient will continue to angulate with the aid of a cane. Patient was given prednisone for breakthrough pain and will continue the once weekly vitamin D. Patient was also given tramadol for any worsening symptoms. Patient come back and see me again in 2-3 weeks. We will make sure she is doing well until she can see the surgeon.

## 2016-01-25 NOTE — Patient Instructions (Signed)
Good to see you  Have a great trip Wear ankle brace daily  Ice after a lot of walking For the back we will do prednisone daily for 1 week but watch blood sugars a little closer.  Tramadol at night for breakthrough pain  Keep doing everything else.  See me again maybe in 2-3 weeks to make sure you are doing well before you see the surgeon.

## 2016-01-25 NOTE — Assessment & Plan Note (Signed)
New problem. Patient does have arthritic changes of this ankle. Lateral column overload. We discussed with patient about bracing. Given some home exercises and some theraband. Discuss proper positioning of the brace as well. Work with Product/process development scientist to learn home exercises. Will come back and see me again in 2-3 weeks.

## 2016-01-25 NOTE — Progress Notes (Signed)
Pre visit review using our clinic review tool, if applicable. No additional management support is needed unless otherwise documented below in the visit note. 

## 2016-02-06 ENCOUNTER — Ambulatory Visit: Payer: Commercial Managed Care - HMO | Admitting: Internal Medicine

## 2016-02-28 ENCOUNTER — Telehealth: Payer: Self-pay | Admitting: *Deleted

## 2016-02-28 DIAGNOSIS — M5489 Other dorsalgia: Secondary | ICD-10-CM

## 2016-02-28 NOTE — Telephone Encounter (Signed)
Rec'd call pt states she is needing referrals to continuing seeing Dr. Glenna Fellows (Neuro), and Dr. Harley Alto (Pain Management) he located in [place] # [phone]...Johny Chess

## 2016-02-28 NOTE — Telephone Encounter (Signed)
Pt has been notified referral has been placed...Kelsey Keller

## 2016-02-28 NOTE — Telephone Encounter (Signed)
Both referrals have been done

## 2016-03-05 ENCOUNTER — Encounter: Payer: Self-pay | Admitting: Internal Medicine

## 2016-03-05 DIAGNOSIS — M545 Low back pain: Secondary | ICD-10-CM

## 2016-03-07 ENCOUNTER — Encounter: Payer: Self-pay | Admitting: Internal Medicine

## 2016-03-08 NOTE — Telephone Encounter (Signed)
[facility] to note above please

## 2016-05-23 LAB — HM DIABETES EYE EXAM

## 2016-07-02 ENCOUNTER — Other Ambulatory Visit (INDEPENDENT_AMBULATORY_CARE_PROVIDER_SITE_OTHER): Payer: Commercial Managed Care - HMO

## 2016-07-02 DIAGNOSIS — E559 Vitamin D deficiency, unspecified: Secondary | ICD-10-CM

## 2016-07-02 DIAGNOSIS — E118 Type 2 diabetes mellitus with unspecified complications: Secondary | ICD-10-CM | POA: Diagnosis not present

## 2016-07-02 DIAGNOSIS — Z0001 Encounter for general adult medical examination with abnormal findings: Secondary | ICD-10-CM | POA: Diagnosis not present

## 2016-07-02 LAB — MICROALBUMIN / CREATININE URINE RATIO
CREATININE, U: 127.5 mg/dL
Microalb Creat Ratio: 0.5 mg/g (ref 0.0–30.0)

## 2016-07-02 LAB — URINALYSIS, ROUTINE W REFLEX MICROSCOPIC
BILIRUBIN URINE: NEGATIVE
Hgb urine dipstick: NEGATIVE
Ketones, ur: NEGATIVE
LEUKOCYTES UA: NEGATIVE
Nitrite: NEGATIVE
PH: 6 (ref 5.0–8.0)
RBC / HPF: NONE SEEN (ref 0–?)
Specific Gravity, Urine: 1.02 (ref 1.000–1.030)
TOTAL PROTEIN, URINE-UPE24: NEGATIVE
URINE GLUCOSE: NEGATIVE
Urobilinogen, UA: 0.2 (ref 0.0–1.0)

## 2016-07-02 LAB — BASIC METABOLIC PANEL
BUN: 20 mg/dL (ref 6–23)
CO2: 31 meq/L (ref 19–32)
Calcium: 10 mg/dL (ref 8.4–10.5)
Chloride: 98 mEq/L (ref 96–112)
Creatinine, Ser: 0.99 mg/dL (ref 0.40–1.20)
GFR: 58.33 mL/min — ABNORMAL LOW (ref >60.00)
GLUCOSE: 140 mg/dL — AB (ref 70–99)
POTASSIUM: 4 meq/L (ref 3.5–5.1)
SODIUM: 137 meq/L (ref 135–145)

## 2016-07-02 LAB — CBC WITH DIFFERENTIAL/PLATELET
BASOS ABS: 0 10*3/uL (ref 0.0–0.1)
Basophils Relative: 0.8 % (ref 0.0–3.0)
EOS ABS: 0.5 10*3/uL (ref 0.0–0.7)
Eosinophils Relative: 9.7 % — ABNORMAL HIGH (ref 0.0–5.0)
HCT: 38.3 % (ref 36.0–46.0)
Hemoglobin: 13.1 g/dL (ref 12.0–15.0)
LYMPHS ABS: 1 10*3/uL (ref 0.7–4.0)
Lymphocytes Relative: 20.4 % (ref 12.0–46.0)
MCHC: 34.2 g/dL (ref 30.0–36.0)
MCV: 94.8 fl (ref 78.0–100.0)
MONO ABS: 0.5 10*3/uL (ref 0.1–1.0)
Monocytes Relative: 9.8 % (ref 3.0–12.0)
NEUTROS PCT: 59.3 % (ref 43.0–77.0)
Neutro Abs: 2.8 10*3/uL (ref 1.4–7.7)
Platelets: 171 10*3/uL (ref 150.0–400.0)
RBC: 4.04 Mil/uL (ref 3.87–5.11)
RDW: 14 % (ref 11.5–15.5)
WBC: 4.7 10*3/uL (ref 4.0–10.5)

## 2016-07-02 LAB — LIPID PANEL
CHOLESTEROL: 168 mg/dL (ref 0–200)
HDL: 67.7 mg/dL (ref >39.00)
LDL Cholesterol: 75 mg/dL (ref 0–99)
NonHDL: 100.35
TRIGLYCERIDES: 127 mg/dL (ref 0.0–149.0)
Total CHOL/HDL Ratio: 2
VLDL: 25.4 mg/dL (ref 0.0–40.0)

## 2016-07-02 LAB — HEPATIC FUNCTION PANEL
ALBUMIN: 4.1 g/dL (ref 3.5–5.2)
ALK PHOS: 80 U/L (ref 39–117)
ALT: 33 U/L (ref 0–35)
AST: 48 U/L — AB (ref 0–37)
Bilirubin, Direct: 0.3 mg/dL (ref 0.0–0.3)
TOTAL PROTEIN: 6.6 g/dL (ref 6.0–8.3)
Total Bilirubin: 1.1 mg/dL (ref 0.2–1.2)

## 2016-07-02 LAB — HEMOGLOBIN A1C: HEMOGLOBIN A1C: 7.2 % — AB (ref 4.6–6.5)

## 2016-07-02 LAB — TSH: TSH: 5.5 u[IU]/mL — AB (ref 0.35–4.50)

## 2016-07-02 LAB — VITAMIN D 25 HYDROXY (VIT D DEFICIENCY, FRACTURES): VITD: 44.16 ng/mL (ref 30.00–100.00)

## 2016-07-04 LAB — PTH, INTACT AND CALCIUM

## 2016-07-16 ENCOUNTER — Ambulatory Visit (INDEPENDENT_AMBULATORY_CARE_PROVIDER_SITE_OTHER): Payer: Medicare HMO | Admitting: Internal Medicine

## 2016-07-16 ENCOUNTER — Encounter: Payer: Self-pay | Admitting: Internal Medicine

## 2016-07-16 VITALS — BP 140/80 | HR 76 | Temp 98.6°F | Resp 20 | Wt 272.0 lb

## 2016-07-16 DIAGNOSIS — E559 Vitamin D deficiency, unspecified: Secondary | ICD-10-CM

## 2016-07-16 DIAGNOSIS — E785 Hyperlipidemia, unspecified: Secondary | ICD-10-CM | POA: Diagnosis not present

## 2016-07-16 DIAGNOSIS — E118 Type 2 diabetes mellitus with unspecified complications: Secondary | ICD-10-CM | POA: Diagnosis not present

## 2016-07-16 DIAGNOSIS — E039 Hypothyroidism, unspecified: Secondary | ICD-10-CM | POA: Diagnosis not present

## 2016-07-16 DIAGNOSIS — I10 Essential (primary) hypertension: Secondary | ICD-10-CM | POA: Diagnosis not present

## 2016-07-16 DIAGNOSIS — Z0001 Encounter for general adult medical examination with abnormal findings: Secondary | ICD-10-CM

## 2016-07-16 MED ORDER — METFORMIN HCL 1000 MG PO TABS: 1000.0000 mg | ORAL_TABLET | Freq: Two times a day (BID) | ORAL | 3 refills | Status: DC

## 2016-07-16 MED ORDER — VITAMIN D3 50 MCG (2000 UT) PO CAPS: 2000.0000 [IU] | ORAL_CAPSULE | Freq: Every day | ORAL | 3 refills | Status: DC

## 2016-07-16 MED ORDER — ALENDRONATE SODIUM 70 MG PO TABS: ORAL_TABLET | ORAL | 3 refills | Status: DC

## 2016-07-16 MED ORDER — LEVOTHYROXINE SODIUM 175 MCG PO TABS: 175.0000 ug | ORAL_TABLET | Freq: Every day | ORAL | 3 refills | Status: DC

## 2016-07-16 MED ORDER — GABAPENTIN 300 MG PO CAPS: ORAL_CAPSULE | ORAL | 3 refills | Status: DC

## 2016-07-16 MED ORDER — PIOGLITAZONE HCL 15 MG PO TABS: 15.0000 mg | ORAL_TABLET | Freq: Every day | ORAL | 3 refills | Status: DC

## 2016-07-16 MED ORDER — ATENOLOL-CHLORTHALIDONE 50-25 MG PO TABS
1.0000 | ORAL_TABLET | Freq: Every day | ORAL | 3 refills | Status: DC
Start: 2016-07-16 — End: 2017-07-21

## 2016-07-16 MED ORDER — MELOXICAM 15 MG PO TABS: 15.0000 mg | ORAL_TABLET | Freq: Every day | ORAL | 3 refills | Status: DC

## 2016-07-16 MED ORDER — GLIMEPIRIDE 4 MG PO TABS: 4.0000 mg | ORAL_TABLET | Freq: Two times a day (BID) | ORAL | 3 refills | Status: DC

## 2016-07-16 NOTE — Assessment & Plan Note (Addendum)
stable overall by history and exam, recent data reviewed with pt, and pt to continue medical treatment as before,  to f/u any worsening symptoms or concerns Lab Results  Component Value Date   HGBA1C 7.2 (H) 07/02/2016   In addition to the time spent performing CPE, I spent an additional 25 minutes face to face,in which greater than 50% of this time was spent in counseling and coordination of care for patient's acute illness as documented.

## 2016-07-16 NOTE — Progress Notes (Signed)
Subjective:    Patient ID: Kelsey Keller, female    DOB: Jul 31, 1940, 76 y.o.   MRN: [id number]  HPI  Here for wellness and f/u;  Overall doing ok;  Pt denies Chest pain, worsening SOB, DOE, wheezing, orthopnea, PND, worsening LE edema, palpitations, dizziness or syncope.  Pt denies neurological change such as new headache, facial or extremity weakness.  Pt states overall good compliance with treatment and medications, good tolerability, and has been trying to follow appropriate diet.  Pt denies worsening depressive symptoms, suicidal ideation or panic. No fever, night sweats, wt loss, loss of appetite, or other constitutional symptoms.  Pt states good ability with ADL's, has low fall risk, home safety reviewed and adequate, no other significant changes in hearing or vision, and only occasionally active with exercise.  Declines flu shot.Declines colonoscopy, did have neg FIT at home (heme neg), declines podiatry as well   Pt denies polydipsia, polyuria, or low sugar symptoms such as weakness or confusion improved with po intake.  Pt states overall good compliance with meds, trying to follow lower cholesterol, diabetic diet, wt overall stable but little exercise however. Due to pain. .Pt continues to have recurring LBP without change in severity, bowel or bladder change, fever, wt loss,  worsening LE pain/numbness/weakness, gait change or falls, and has "sacroiliac" chronic pain to lower back, has seen Dr SMith/sport med, with occas pain to the feet right > left , due for f/u in 2wks with sport med, also having PT now.  Does seem to be helping as the only position with no pain before was lying down, now can sit and stand longer.  No other new history Past Medical History:  Diagnosis Date  . ALLERGIC RHINITIS 04/29/2007  . DIABETES MELLITUS, TYPE II 06/23/2007  . HIATAL HERNIA, HX OF 04/29/2007  . HYPERLIPIDEMIA 04/29/2007  . HYPERTENSION 04/29/2007  . HYPOKALEMIA 05/02/2009  . HYPOTHYROIDISM  04/29/2007  . LOW BACK PAIN 04/29/2007  . OSTEOARTHRITIS, KNEE, LEFT 06/23/2007  . OSTEOPOROSIS NOS 05/12/2007  . SPINAL STENOSIS, LUMBAR 04/29/2007  . TOE PAIN 02/20/2010   Past Surgical History:  Procedure Laterality Date  . BACK SURGERY N/A 2014   Lower back? not sure of procedure, will get medical records for practice  . CATARACT EXTRACTION W/ INTRAOCULAR LENS  IMPLANT, BILATERAL Bilateral 05/2011   doing well  . TOTAL KNEE ARTHROPLASTY  2006   Right  . TOTAL KNEE ARTHROPLASTY  2006   right    reports that she has never smoked. She does not have any smokeless tobacco history on file. She reports that she drinks alcohol. She reports that she does not use drugs. family history includes Arthritis in her brother and mother; COPD in her brother; Coronary artery disease in her father and other; Diabetes in her other; Hyperlipidemia in her brother; Hypertension in her mother; Stroke in her brother. No Known Allergies Current Outpatient Prescriptions on File Prior to Visit  Medication Sig Dispense Refill  . ACCU-CHEK SOFTCLIX LANCETS lancets 1 each by Other route 2 (two) times daily. Use to help check blood sugars twice a day Dx e11.9 100 each 3  . alendronate (FOSAMAX) 70 MG tablet TAKE 1 TABLET ONCE A WEEK. TAKE WITH A FULL GLASS OF WATER ON AN EMPTY STOMACH. 12 tablet 3  . aspirin 81 MG EC tablet Take 81 mg by mouth daily.      Marland Kitchen atenolol-chlorthalidone (TENORETIC) 50-25 MG tablet Take 1 tablet by mouth daily. 90 tablet 3  . Blood  Glucose Calibration (ACCU-CHEK COMPACT PLUS CONTROL) SOLN Use as directed Dx E11.9 3 each 3  . Blood Glucose Monitoring Suppl (ACCU-CHEK AVIVA PLUS) w/Device KIT Use as directed to check blood sugars everyday Dx e11.9 1 kit 0  . calcium carbonate (TUMS EX) 750 MG chewable tablet Chew 1 tablet by mouth 2 (two) times daily.      . cetirizine (ZYRTEC) 10 MG tablet Take 1 tablet (10 mg total) by mouth daily. 90 tablet 3  . co-enzyme Q-10 30 MG capsule Take 100 mg  by mouth daily.    . fluticasone (FLONASE) 50 MCG/ACT nasal spray Place 2 sprays into both nostrils daily. 48 g 3  . gabapentin (NEURONTIN) 300 MG capsule 1 pill in AM, 1 pill PM and 2 pills at night 360 capsule 3  . glimepiride (AMARYL) 4 MG tablet Take 1 tablet (4 mg total) by mouth 2 (two) times daily. 180 tablet 3  . glucose blood (ACCU-CHEK AVIVA PLUS) test strip 1 each by Other route 2 (two) times daily. Use to check blood sugars twice a day Dx e11.9 300 each 3  . glucose blood test strip Use as directed two times a day, diagnosis code E11.9 200 each 3  . levothyroxine (SYNTHROID, LEVOTHROID) 150 MCG tablet Take 1 tablet (150 mcg total) by mouth daily. 90 tablet 3  . meloxicam (MOBIC) 15 MG tablet Take 1 tablet (15 mg total) by mouth daily. 90 tablet 3  . metFORMIN (GLUCOPHAGE) 1000 MG tablet Take 1 tablet (1,000 mg total) by mouth 2 (two) times daily with a meal. 180 tablet 3  . pioglitazone (ACTOS) 15 MG tablet Take 1 tablet (15 mg total) by mouth daily. 90 tablet 3  . predniSONE (DELTASONE) 20 MG tablet Take 2 tablets (40 mg total) by mouth daily with breakfast. 14 tablet 0  . Red Yeast Rice 600 MG CAPS Take 600 mg by mouth 2 (two) times daily.    . traMADol (ULTRAM) 50 MG tablet Take 1 tablet (50 mg total) by mouth at bedtime as needed. 30 tablet 0  . Vitamin D, Ergocalciferol, (DRISDOL) 50000 units CAPS capsule Take 1 capsule (50,000 Units total) by mouth every 7 (seven) days. 12 capsule 0   No current facility-administered medications on file prior to visit.     Review of Systems Constitutional: Negative for increased diaphoresis, or other activity, appetite or siginficant weight change other than noted HENT: Negative for worsening hearing loss, ear pain, facial swelling, mouth sores and neck stiffness.   Eyes: Negative for other worsening pain, redness or visual disturbance.  Respiratory: Negative for choking or stridor Cardiovascular: Negative for other chest pain and  palpitations.  Gastrointestinal: Negative for worsening diarrhea, blood in stool, or abdominal distention Genitourinary: Negative for hematuria, flank pain or change in urine volume.  Musculoskeletal: Negative for myalgias or other joint complaints.  Skin: Negative for other color change and wound or drainage.  Neurological: Negative for syncope and numbness. other than noted Hematological: Negative for adenopathy. or other swelling Psychiatric/Behavioral: Negative for hallucinations, SI, self-injury, decreased concentration or other worsening agitation.  All other system neg per pt    Objective:   Physical Exam BP 140/80   Pulse 76   Temp 98.6 F (37 C) (Oral)   Resp 20   Wt 272 lb (123.4 kg)   SpO2 95%   BMI 43.90 kg/m  VS noted,  Constitutional: Pt appears in no apparent distress HENT: Head: NCAT.  Right Ear: External ear normal.  Left Ear:   External ear normal.  Eyes: . Pupils are equal, round, and reactive to light. Conjunctivae and EOM are normal Neck: Normal range of motion. Neck supple.  Cardiovascular: Normal rate and regular rhythm.   Pulmonary/Chest: Effort normal and breath sounds without rales or wheezing.  Abd:  Soft, NT, ND, + BS Spine nontender midline throughout Neurological: Pt is alert. Not confused , motor grossly intact Skin: Skin is warm. No rash, no LE edema Psychiatric: Pt behavior is normal. No agitation.  No other new exam findings    Assessment & Plan:   

## 2016-07-16 NOTE — Patient Instructions (Signed)
OK to increase the levothyroxine to 175 per day  OK to stop the Vit D 50,000 per wk  Please start Vit D3 at 2000 units per day  Please continue all other medications as before, and refills have been done if requested.  Please have the pharmacy call with any other refills you may need.  Please continue your efforts at being more active, low cholesterol diet, and weight control.  You are otherwise up to date with prevention measures today.  Please keep your appointments with your specialists as you may have planned  Please return in 6 months, or sooner if needed, with Lab testing done 3-5 days before

## 2016-07-16 NOTE — Progress Notes (Signed)
Pre visit review using our clinic review tool, if applicable. No additional management support is needed unless otherwise documented below in the visit note. 

## 2016-07-16 NOTE — Assessment & Plan Note (Signed)
stable overall by history and exam, recent data reviewed with pt, and pt to continue medical treatment as before,  to f/u any worsening symptoms or concerns BP Readings from Last 3 Encounters:  07/16/16 140/80  01/25/16 130/80  01/17/16 140/78

## 2016-07-16 NOTE — Assessment & Plan Note (Signed)
Lab Results  Component Value Date   TSH 5.50 (H) 07/02/2016   Uncontrolled, for increase levothyroxine to 175 qd, to f/u any worsening symptoms or concerns, f/u lab next visit

## 2016-07-16 NOTE — Assessment & Plan Note (Signed)
stable overall by history and exam, recent data reviewed with pt, and pt to continue medical treatment as before,  to f/u any worsening symptoms or concerns Lab Results  Component Value Date   LDLCALC 75 07/02/2016

## 2016-07-21 NOTE — Assessment & Plan Note (Signed)

## 2016-07-21 NOTE — Assessment & Plan Note (Signed)
Ok for change vit d 50 K to 2000 unit per day

## 2016-07-22 DIAGNOSIS — M545 Low back pain: Secondary | ICD-10-CM | POA: Diagnosis not present

## 2016-07-22 DIAGNOSIS — M533 Sacrococcygeal disorders, not elsewhere classified: Secondary | ICD-10-CM | POA: Diagnosis not present

## 2016-07-22 DIAGNOSIS — M6281 Muscle weakness (generalized): Secondary | ICD-10-CM | POA: Diagnosis not present

## 2016-07-22 DIAGNOSIS — R293 Abnormal posture: Secondary | ICD-10-CM | POA: Diagnosis not present

## 2016-07-24 DIAGNOSIS — M6281 Muscle weakness (generalized): Secondary | ICD-10-CM | POA: Diagnosis not present

## 2016-07-24 DIAGNOSIS — M533 Sacrococcygeal disorders, not elsewhere classified: Secondary | ICD-10-CM | POA: Diagnosis not present

## 2016-07-24 DIAGNOSIS — M545 Low back pain: Secondary | ICD-10-CM | POA: Diagnosis not present

## 2016-07-24 DIAGNOSIS — R293 Abnormal posture: Secondary | ICD-10-CM | POA: Diagnosis not present

## 2016-07-30 DIAGNOSIS — M545 Low back pain: Secondary | ICD-10-CM | POA: Diagnosis not present

## 2016-07-30 DIAGNOSIS — M533 Sacrococcygeal disorders, not elsewhere classified: Secondary | ICD-10-CM | POA: Diagnosis not present

## 2016-07-30 DIAGNOSIS — M6281 Muscle weakness (generalized): Secondary | ICD-10-CM | POA: Diagnosis not present

## 2016-07-30 DIAGNOSIS — R293 Abnormal posture: Secondary | ICD-10-CM | POA: Diagnosis not present

## 2016-08-07 DIAGNOSIS — M533 Sacrococcygeal disorders, not elsewhere classified: Secondary | ICD-10-CM | POA: Diagnosis not present

## 2016-09-02 DIAGNOSIS — M545 Low back pain: Secondary | ICD-10-CM | POA: Diagnosis not present

## 2016-09-02 DIAGNOSIS — M47817 Spondylosis without myelopathy or radiculopathy, lumbosacral region: Secondary | ICD-10-CM | POA: Diagnosis not present

## 2016-09-02 DIAGNOSIS — M461 Sacroiliitis, not elsewhere classified: Secondary | ICD-10-CM | POA: Diagnosis not present

## 2016-09-23 DIAGNOSIS — M47817 Spondylosis without myelopathy or radiculopathy, lumbosacral region: Secondary | ICD-10-CM | POA: Diagnosis not present

## 2016-09-23 DIAGNOSIS — M461 Sacroiliitis, not elsewhere classified: Secondary | ICD-10-CM | POA: Diagnosis not present

## 2016-10-15 DIAGNOSIS — Z599 Problem related to housing and economic circumstances, unspecified: Secondary | ICD-10-CM | POA: Diagnosis not present

## 2016-10-15 DIAGNOSIS — Z7984 Long term (current) use of oral hypoglycemic drugs: Secondary | ICD-10-CM | POA: Diagnosis not present

## 2016-10-15 DIAGNOSIS — Z79891 Long term (current) use of opiate analgesic: Secondary | ICD-10-CM | POA: Diagnosis not present

## 2016-10-15 DIAGNOSIS — J309 Allergic rhinitis, unspecified: Secondary | ICD-10-CM | POA: Diagnosis not present

## 2016-10-15 DIAGNOSIS — E1142 Type 2 diabetes mellitus with diabetic polyneuropathy: Secondary | ICD-10-CM | POA: Diagnosis not present

## 2016-10-15 DIAGNOSIS — Z7951 Long term (current) use of inhaled steroids: Secondary | ICD-10-CM | POA: Diagnosis not present

## 2016-10-15 DIAGNOSIS — M13861 Other specified arthritis, right knee: Secondary | ICD-10-CM | POA: Diagnosis not present

## 2016-10-15 DIAGNOSIS — M13862 Other specified arthritis, left knee: Secondary | ICD-10-CM | POA: Diagnosis not present

## 2016-10-15 DIAGNOSIS — M545 Low back pain: Secondary | ICD-10-CM | POA: Diagnosis not present

## 2016-10-15 DIAGNOSIS — M810 Age-related osteoporosis without current pathological fracture: Secondary | ICD-10-CM | POA: Diagnosis not present

## 2016-10-15 DIAGNOSIS — I10 Essential (primary) hypertension: Secondary | ICD-10-CM | POA: Diagnosis not present

## 2016-10-15 DIAGNOSIS — Z0000 Encounter for general adult medical examination without abnormal findings: Secondary | ICD-10-CM | POA: Diagnosis not present

## 2016-10-15 DIAGNOSIS — E039 Hypothyroidism, unspecified: Secondary | ICD-10-CM | POA: Diagnosis not present

## 2016-10-15 DIAGNOSIS — Z7982 Long term (current) use of aspirin: Secondary | ICD-10-CM | POA: Diagnosis not present

## 2016-12-04 ENCOUNTER — Other Ambulatory Visit: Payer: Self-pay | Admitting: Internal Medicine

## 2016-12-04 DIAGNOSIS — Z1231 Encounter for screening mammogram for malignant neoplasm of breast: Secondary | ICD-10-CM

## 2016-12-11 ENCOUNTER — Ambulatory Visit (INDEPENDENT_AMBULATORY_CARE_PROVIDER_SITE_OTHER): Payer: Medicare HMO | Admitting: Internal Medicine

## 2016-12-11 ENCOUNTER — Encounter: Payer: Self-pay | Admitting: Internal Medicine

## 2016-12-11 DIAGNOSIS — M48061 Spinal stenosis, lumbar region without neurogenic claudication: Secondary | ICD-10-CM

## 2016-12-11 DIAGNOSIS — J309 Allergic rhinitis, unspecified: Secondary | ICD-10-CM | POA: Diagnosis not present

## 2016-12-11 DIAGNOSIS — I10 Essential (primary) hypertension: Secondary | ICD-10-CM

## 2016-12-11 DIAGNOSIS — E118 Type 2 diabetes mellitus with unspecified complications: Secondary | ICD-10-CM | POA: Diagnosis not present

## 2016-12-11 MED ORDER — HYDROCODONE-ACETAMINOPHEN 5-325 MG PO TABS: 1.0000 | ORAL_TABLET | Freq: Four times a day (QID) | ORAL | 0 refills | Status: DC | PRN

## 2016-12-11 MED ORDER — METHYLPREDNISOLONE ACETATE 80 MG/ML IJ SUSP
80.0000 mg | Freq: Once | INTRAMUSCULAR | Status: AC
  Administered 2016-12-11: 80 mg via INTRAMUSCULAR

## 2016-12-11 MED ORDER — CETIRIZINE HCL 10 MG PO TABS: 10.0000 mg | ORAL_TABLET | Freq: Every day | ORAL | 3 refills | Status: DC

## 2016-12-11 MED ORDER — TRAMADOL HCL 50 MG PO TABS: 50.0000 mg | ORAL_TABLET | Freq: Four times a day (QID) | ORAL | 0 refills | Status: DC | PRN

## 2016-12-11 MED ORDER — PREDNISONE 10 MG PO TABS: ORAL_TABLET | ORAL | 0 refills | Status: DC

## 2016-12-11 NOTE — Assessment & Plan Note (Signed)
Mild elevated, likely reacitve, o/w stable overall by history and exam, recent data reviewed with pt, and pt to continue medical treatment as before,  to f/u any worsening symptoms or concerns BP Readings from Last 3 Encounters:  12/11/16 (!) 144/78  07/16/16 140/80  01/25/16 130/80

## 2016-12-11 NOTE — Progress Notes (Signed)
Subjective:    Patient ID: Kelsey Keller, female    DOB: Sep 12, 1940, 76 y.o.   MRN: [id number]  HPI  Here with acute onset 3 days left lower back pain, with some severe stabbing kind of pain to the buttock as well, severe pain just to walk and get to the BR, stayed in bed all day 2 days ago. Is s/p nerve ablation earlier in this yr per Dr Roy/NS but effect seemed short lived;  Pain now seems to radiate to distal left leg.  Also has known bilat peripheral LE neuropathy.  Pt continues to have recurring LBP, bowel or bladder change, fever, wt loss or falls. Has been trying to lose wt to help the back pain but not working in past few days. Walking with cane.  Current tramadol not helping well.  Brothers hydrocodone 5 helped quite a bit Wt Readings from Last 3 Encounters:  12/11/16 261 lb (118.4 kg)  07/16/16 272 lb (123.4 kg)  01/25/16 279 lb (126.6 kg)  Also Does have several wks ongoing nasal allergy symptoms with clearish congestion, itch and sneezing, without fever, pain, ST, cough, swelling or wheezing  Asks for zyrtec restart   Pt denies polydipsia, polyuria  Past Medical History:  Diagnosis Date  . ALLERGIC RHINITIS 04/29/2007  . DIABETES MELLITUS, TYPE II 06/23/2007  . HIATAL HERNIA, HX OF 04/29/2007  . HYPERLIPIDEMIA 04/29/2007  . HYPERTENSION 04/29/2007  . HYPOKALEMIA 05/02/2009  . HYPOTHYROIDISM 04/29/2007  . LOW BACK PAIN 04/29/2007  . OSTEOARTHRITIS, KNEE, LEFT 06/23/2007  . OSTEOPOROSIS NOS 05/12/2007  . SPINAL STENOSIS, LUMBAR 04/29/2007  . TOE PAIN 02/20/2010   Past Surgical History:  Procedure Laterality Date  . BACK SURGERY N/A 2014   Lower back? not sure of procedure, will get medical records for practice  . CATARACT EXTRACTION W/ INTRAOCULAR LENS  IMPLANT, BILATERAL Bilateral 05/2011   doing well  . TOTAL KNEE ARTHROPLASTY  2006   Right  . TOTAL KNEE ARTHROPLASTY  2006   right    reports that she has never smoked. She has never used smokeless tobacco. She reports  that she drinks alcohol. She reports that she does not use drugs. family history includes Arthritis in her brother and mother; COPD in her brother; Coronary artery disease in her father and other; Diabetes in her other; Hyperlipidemia in her brother; Hypertension in her mother; Stroke in her brother. No Known Allergies Current Outpatient Prescriptions on File Prior to Visit  Medication Sig Dispense Refill  . ACCU-CHEK SOFTCLIX LANCETS lancets 1 each by Other route 2 (two) times daily. Use to help check blood sugars twice a day Dx e11.9 100 each 3  . alendronate (FOSAMAX) 70 MG tablet TAKE 1 TABLET ONCE A WEEK. TAKE WITH A FULL GLASS OF WATER ON AN EMPTY STOMACH. 12 tablet 3  . aspirin 81 MG EC tablet Take 81 mg by mouth daily.      Marland Kitchen atenolol-chlorthalidone (TENORETIC) 50-25 MG tablet Take 1 tablet by mouth daily. 90 tablet 3  . Blood Glucose Calibration (ACCU-CHEK COMPACT PLUS CONTROL) SOLN Use as directed Dx E11.9 3 each 3  . Blood Glucose Monitoring Suppl (ACCU-CHEK AVIVA PLUS) w/Device KIT Use as directed to check blood sugars everyday Dx e11.9 1 kit 0  . calcium carbonate (TUMS EX) 750 MG chewable tablet Chew 1 tablet by mouth 2 (two) times daily.      . Cholecalciferol (VITAMIN D3) 2000 units capsule Take 1 capsule (2,000 Units total) by mouth daily. 100 capsule  3  . co-enzyme Q-10 30 MG capsule Take 100 mg by mouth daily.    . fluticasone (FLONASE) 50 MCG/ACT nasal spray Place 2 sprays into both nostrils daily. 48 g 3  . gabapentin (NEURONTIN) 300 MG capsule 1 pill in AM, 1 pill PM and 2 pills at night (Patient taking differently: 2 pill in AM, 2 pill PM and 2 pills at night) 360 capsule 3  . glimepiride (AMARYL) 4 MG tablet Take 1 tablet (4 mg total) by mouth 2 (two) times daily. 180 tablet 3  . glucose blood (ACCU-CHEK AVIVA PLUS) test strip 1 each by Other route 2 (two) times daily. Use to check blood sugars twice a day Dx e11.9 300 each 3  . glucose blood test strip Use as directed  two times a day, diagnosis code E11.9 200 each 3  . levothyroxine (SYNTHROID, LEVOTHROID) 175 MCG tablet Take 1 tablet (175 mcg total) by mouth daily before breakfast. 90 tablet 3  . meloxicam (MOBIC) 15 MG tablet Take 1 tablet (15 mg total) by mouth daily. 90 tablet 3  . metFORMIN (GLUCOPHAGE) 1000 MG tablet Take 1 tablet (1,000 mg total) by mouth 2 (two) times daily with a meal. 180 tablet 3  . pioglitazone (ACTOS) 15 MG tablet Take 1 tablet (15 mg total) by mouth daily. 90 tablet 3  . Red Yeast Rice 600 MG CAPS Take 600 mg by mouth 2 (two) times daily.     No current facility-administered medications on file prior to visit.    Review of Systems  Constitutional: Negative for other unusual diaphoresis or sweats HENT: Negative for ear discharge or swelling Eyes: Negative for other worsening visual disturbances Respiratory: Negative for stridor or other swelling  Gastrointestinal: Negative for worsening distension or other blood Genitourinary: Negative for retention or other urinary change Musculoskeletal: Negative for other MSK pain or swelling Skin: Negative for color change or other new lesions Neurological: Negative for worsening tremors and other numbness  Psychiatric/Behavioral: Negative for worsening agitation or other fatigue All other system neg per pt    Objective:   Physical Exam BP (!) 144/78   Pulse 84   Ht _0  (1.676 m)   Wt 261 lb (118.4 kg)   SpO2 96%   BMI 42.13 kg/m  VS noted, obese Constitutional: Pt appears in NAD HENT: Head: NCAT.  Right Ear: External ear normal.  Left Ear: External ear normal.  Eyes: . Pupils are equal, round, and reactive to light. Conjunctivae and EOM are normal Nose: without d/c or deformity Bilat tm's with mild erythema.  Max sinus areas non tender.  Pharynx with mild erythema, no exudate Neck: Neck supple. Gross normal ROM Cardiovascular: Normal rate and regular rhythm.   Pulmonary/Chest: Effort normal and breath sounds without  rales or wheezing.  Spine nontender in midline Has mild right lumbar paravetebral and buttock tender Abd:  Soft, NT, ND, + BS, no organomegaly Neurological: Pt is alert. At baseline orientation, motor 5/5 intact Skin: Skin is warm. No rashes, other new lesions, no LE edema Psychiatric: Pt behavior is normal without agitation  No other exam findings    Assessment & Plan:

## 2016-12-11 NOTE — Assessment & Plan Note (Signed)
Mild, for zyrtec restart,  Also should improved with steroid tx,  to f/u any worsening symptoms or concerns

## 2016-12-11 NOTE — Assessment & Plan Note (Signed)
With left sciatica like flare; for limited hydrocodone, then tramadol after; for depomedrol IM 80, and predpac asd, cont gabapentin as she takes, and consider f/u with Dr Carloyn Manner if not improved

## 2016-12-11 NOTE — Patient Instructions (Signed)
You had the steroid shot today  Please take all new medication as prescribed - the hydrocodone as needed for pain, and prednisone  Please continue all other medications as before, including the gabapentin, but no need to take the tramadol when taking the hydrocodone.  Please have the pharmacy call with any other refills you may need.  Please keep your appointments with your specialists as you may have planned

## 2016-12-11 NOTE — Assessment & Plan Note (Signed)
stable overall by history and exam, recent data reviewed with pt, and pt to continue medical treatment as before,  to f/u any worsening symptoms or concerns Lab Results  Component Value Date   HGBA1C 7.2 (H) 07/02/2016   Pt to call for onset polys or sugar > 200 with steroid tx

## 2016-12-16 DIAGNOSIS — M419 Scoliosis, unspecified: Secondary | ICD-10-CM | POA: Diagnosis not present

## 2016-12-16 DIAGNOSIS — M47816 Spondylosis without myelopathy or radiculopathy, lumbar region: Secondary | ICD-10-CM | POA: Diagnosis not present

## 2016-12-18 ENCOUNTER — Other Ambulatory Visit: Payer: Self-pay | Admitting: Nurse Practitioner

## 2016-12-18 DIAGNOSIS — M47816 Spondylosis without myelopathy or radiculopathy, lumbar region: Secondary | ICD-10-CM

## 2016-12-20 ENCOUNTER — Ambulatory Visit
Admission: RE | Admit: 2016-12-20 | Discharge: 2016-12-20 | Disposition: A | Discharge status: Home / Self Care | Payer: Medicare HMO | Source: Ambulatory Visit | Attending: Nurse Practitioner | Admitting: Nurse Practitioner

## 2016-12-20 DIAGNOSIS — M47817 Spondylosis without myelopathy or radiculopathy, lumbosacral region: Secondary | ICD-10-CM | POA: Diagnosis not present

## 2016-12-20 DIAGNOSIS — M47816 Spondylosis without myelopathy or radiculopathy, lumbar region: Secondary | ICD-10-CM

## 2016-12-20 MED ORDER — IOPAMIDOL (ISOVUE-M 200) INJECTION 41%
1.0000 mL | Freq: Once | INTRAMUSCULAR | Status: AC
  Administered 2016-12-20: 1 mL via EPIDURAL

## 2016-12-20 MED ORDER — METHYLPREDNISOLONE ACETATE 40 MG/ML INJ SUSP (RADIOLOG
120.0000 mg | Freq: Once | INTRAMUSCULAR | Status: AC
  Administered 2016-12-20: 120 mg via EPIDURAL

## 2016-12-20 NOTE — Discharge Instructions (Signed)
Post Procedure Spinal Discharge Instruction Sheet ° °1. You may resume a regular diet and any medications that you routinely take (including pain medications). ° °2. No driving day of procedure. ° °3. Light activity throughout the rest of the day.  Do not do any strenuous work, exercise, bending or lifting.  The day following the procedure, you can resume normal physical activity but you should refrain from exercising or physical therapy for at least three days thereafter. ° ° °Common Side Effects: ° °· Restlessness or inability to sleep- you may have trouble sleeping for the next few days.  Ask your referring physician if you need any medication for sleep. ° °· Facial flushing or redness- should subside within a few days. ° °· Increased pain- a temporary increase in pain a day or two following your procedure is not unusual.  Take your pain medication as prescribed by your referring physician. ° °· Leg cramps ° °Please contact our office at [phone] for the following symptoms: °· Fever greater than 100 degrees. °· Headaches unresolved with medication after 2-3 days. °· Increased swelling, pain, or redness at injection site. ° °Thank you for visiting our office. °

## 2016-12-31 ENCOUNTER — Other Ambulatory Visit: Payer: Self-pay | Admitting: Nurse Practitioner

## 2016-12-31 DIAGNOSIS — M47816 Spondylosis without myelopathy or radiculopathy, lumbar region: Secondary | ICD-10-CM

## 2017-01-02 ENCOUNTER — Other Ambulatory Visit (INDEPENDENT_AMBULATORY_CARE_PROVIDER_SITE_OTHER): Payer: Medicare HMO

## 2017-01-02 DIAGNOSIS — E118 Type 2 diabetes mellitus with unspecified complications: Secondary | ICD-10-CM | POA: Diagnosis not present

## 2017-01-02 LAB — BASIC METABOLIC PANEL
BUN: 34 mg/dL — ABNORMAL HIGH (ref 6–23)
CALCIUM: 10.6 mg/dL — AB (ref 8.4–10.5)
CO2: 32 mEq/L (ref 19–32)
Chloride: 100 mEq/L (ref 96–112)
Creatinine, Ser: 0.99 mg/dL (ref 0.40–1.20)
GFR: 58.25 mL/min — AB (ref >60.00)
Glucose, Bld: 106 mg/dL — ABNORMAL HIGH (ref 70–99)
POTASSIUM: 4.3 meq/L (ref 3.5–5.1)
SODIUM: 138 meq/L (ref 135–145)

## 2017-01-02 LAB — HEPATIC FUNCTION PANEL
ALK PHOS: 77 U/L (ref 39–117)
ALT: 35 U/L (ref 0–35)
AST: 35 U/L (ref 0–37)
Albumin: 4.1 g/dL (ref 3.5–5.2)
BILIRUBIN TOTAL: 1 mg/dL (ref 0.2–1.2)
Bilirubin, Direct: 0.2 mg/dL (ref 0.0–0.3)
Total Protein: 6.5 g/dL (ref 6.0–8.3)

## 2017-01-02 LAB — HEMOGLOBIN A1C: Hgb A1c MFr Bld: 7.1 % — ABNORMAL HIGH (ref 4.6–6.5)

## 2017-01-02 LAB — LIPID PANEL
CHOL/HDL RATIO: 2
Cholesterol: 165 mg/dL (ref 0–200)
HDL: 76.8 mg/dL (ref >39.00)
LDL CALC: 71 mg/dL (ref 0–99)
NonHDL: 88.09
Triglycerides: 84 mg/dL (ref 0.0–149.0)
VLDL: 16.8 mg/dL (ref 0.0–40.0)

## 2017-01-06 ENCOUNTER — Ambulatory Visit
Admission: RE | Admit: 2017-01-06 | Discharge: 2017-01-06 | Disposition: A | Discharge status: Home / Self Care | Payer: Medicare HMO | Source: Ambulatory Visit | Attending: Nurse Practitioner | Admitting: Nurse Practitioner

## 2017-01-06 DIAGNOSIS — M47816 Spondylosis without myelopathy or radiculopathy, lumbar region: Secondary | ICD-10-CM

## 2017-01-06 DIAGNOSIS — M4325 Fusion of spine, thoracolumbar region: Secondary | ICD-10-CM | POA: Diagnosis not present

## 2017-01-06 MED ORDER — METHYLPREDNISOLONE ACETATE 40 MG/ML INJ SUSP (RADIOLOG
120.0000 mg | Freq: Once | INTRAMUSCULAR | Status: AC
  Administered 2017-01-06: 120 mg via EPIDURAL

## 2017-01-06 MED ORDER — IOPAMIDOL (ISOVUE-M 200) INJECTION 41%
1.0000 mL | Freq: Once | INTRAMUSCULAR | Status: AC
  Administered 2017-01-06: 1 mL via EPIDURAL

## 2017-01-14 ENCOUNTER — Encounter: Payer: Self-pay | Admitting: Internal Medicine

## 2017-01-14 ENCOUNTER — Ambulatory Visit (INDEPENDENT_AMBULATORY_CARE_PROVIDER_SITE_OTHER)
Admission: RE | Admit: 2017-01-14 | Discharge: 2017-01-14 | Disposition: A | Discharge status: Home / Self Care | Payer: Medicare HMO | Source: Ambulatory Visit | Attending: Internal Medicine | Admitting: Internal Medicine

## 2017-01-14 ENCOUNTER — Ambulatory Visit (INDEPENDENT_AMBULATORY_CARE_PROVIDER_SITE_OTHER): Payer: Medicare HMO | Admitting: Internal Medicine

## 2017-01-14 VITALS — BP 134/88 | HR 60 | Ht 66.0 in | Wt 253.0 lb

## 2017-01-14 DIAGNOSIS — E118 Type 2 diabetes mellitus with unspecified complications: Secondary | ICD-10-CM

## 2017-01-14 DIAGNOSIS — E2839 Other primary ovarian failure: Secondary | ICD-10-CM | POA: Diagnosis not present

## 2017-01-14 DIAGNOSIS — Z0000 Encounter for general adult medical examination without abnormal findings: Secondary | ICD-10-CM

## 2017-01-14 DIAGNOSIS — E785 Hyperlipidemia, unspecified: Secondary | ICD-10-CM

## 2017-01-14 DIAGNOSIS — I10 Essential (primary) hypertension: Secondary | ICD-10-CM | POA: Diagnosis not present

## 2017-01-14 DIAGNOSIS — J309 Allergic rhinitis, unspecified: Secondary | ICD-10-CM | POA: Diagnosis not present

## 2017-01-14 MED ORDER — TRIAMCINOLONE ACETONIDE 55 MCG/ACT NA AERO: 2.0000 | INHALATION_SPRAY | Freq: Every day | NASAL | 12 refills | Status: DC

## 2017-01-14 MED ORDER — TRAMADOL HCL 50 MG PO TABS: 50.0000 mg | ORAL_TABLET | Freq: Four times a day (QID) | ORAL | 2 refills | Status: DC | PRN

## 2017-01-14 NOTE — Addendum Note (Signed)
Addended by: Biagio Borg on: 01/14/2017 10:51 AM   Modules accepted: Orders

## 2017-01-14 NOTE — Assessment & Plan Note (Signed)
stable overall by history and exam, recent data reviewed with pt, and pt to continue medical treatment as before,  to f/u any worsening symptoms or concerns Lab Results  Component Value Date   HGBA1C 7.1 (H) 01/02/2017

## 2017-01-14 NOTE — Assessment & Plan Note (Signed)
stable overall by history and exam, recent data reviewed with pt, and pt to continue medical treatment as before,  to f/u any worsening symptoms or concerns Lab Results  Component Value Date   LDLCALC 71 01/02/2017

## 2017-01-14 NOTE — Assessment & Plan Note (Signed)
Mild to mod, for change flonase to nasacort dialy, restart zyrec daily, to f/u any worsening symptoms or concerns

## 2017-01-14 NOTE — Assessment & Plan Note (Signed)
stable overall by history and exam, recent data reviewed with pt, and pt to continue medical treatment as before,  to f/u any worsening symptoms or concerns BP Readings from Last 3 Encounters:  01/14/17 134/88  01/06/17 117/63  12/20/16 (!) 149/71

## 2017-01-14 NOTE — Progress Notes (Signed)
Subjective:    Patient ID: Kelsey Keller, female    DOB: 13-Aug-1940, 76 y.o.   MRN: [id number]  HPI  Here to f/u; overall doing ok,  Pt denies chest pain, increasing sob or doe, wheezing, orthopnea, PND, increased LE swelling, palpitations, dizziness or syncope.  Pt denies new neurological symptoms such as new headache, or facial or extremity weakness or numbness.  Pt denies polydipsia, polyuria, or low sugar episode.  Pt states overall good compliance with meds, mostly trying to follow appropriate diet, with wt overall stable,  but little exercise however, due to chronic pain.  Has lost siginificant wt with wt watchers. BP Readings from Last 3 Encounters:  01/14/17 134/88  01/06/17 117/63  12/20/16 (!) 149/71   Wt Readings from Last 3 Encounters:  01/14/17 253 lb (114.8 kg)  12/11/16 261 lb (118.4 kg)  07/16/16 272 lb (123.4 kg)  Due for mammogram and eye exam later this mo.  Wants to wait until next visit for shingrix.  Has a [place] place [place]te beach, plans to go for most of the summer. Asks for tramadol refill for back pain recurring - Pt continues to have recurring LBP without change in severity, bowel or bladder change, fever, wt loss,  worsening LE pain/numbness/weakness, gait change or falls . S/p ESI per NS x 2 , with good results after the second.  Does have several wks ongoing nasal allergy symptoms with clearish congestion, itch and sneezing, without fever, pain, ST, cough, swelling or wheezing, but admits to not taking meds daily.  Due for f/u DXA Past Medical History:  Diagnosis Date  . ALLERGIC RHINITIS 04/29/2007  . DIABETES MELLITUS, TYPE II 06/23/2007  . HIATAL HERNIA, HX OF 04/29/2007  . HYPERLIPIDEMIA 04/29/2007  . HYPERTENSION 04/29/2007  . HYPOKALEMIA 05/02/2009  . HYPOTHYROIDISM 04/29/2007  . LOW BACK PAIN 04/29/2007  . OSTEOARTHRITIS, KNEE, LEFT 06/23/2007  . OSTEOPOROSIS NOS 05/12/2007  . SPINAL STENOSIS, LUMBAR 04/29/2007  . TOE PAIN 02/20/2010   Past  Surgical History:  Procedure Laterality Date  . BACK SURGERY N/A 2014   Lower back? not sure of procedure, will get medical records for practice  . CATARACT EXTRACTION W/ INTRAOCULAR LENS  IMPLANT, BILATERAL Bilateral 05/2011   doing well  . TOTAL KNEE ARTHROPLASTY  2006   Right  . TOTAL KNEE ARTHROPLASTY  2006   right    reports that she has never smoked. She has never used smokeless tobacco. She reports that she drinks alcohol. She reports that she does not use drugs. family history includes Arthritis in her brother and mother; COPD in her brother; Coronary artery disease in her father and other; Diabetes in her other; Hyperlipidemia in her brother; Hypertension in her mother; Stroke in her brother. No Known Allergies  Current Outpatient Prescriptions on File Prior to Visit  Medication Sig Dispense Refill  . ACCU-CHEK SOFTCLIX LANCETS lancets 1 each by Other route 2 (two) times daily. Use to help check blood sugars twice a day Dx e11.9 100 each 3  . alendronate (FOSAMAX) 70 MG tablet TAKE 1 TABLET ONCE A WEEK. TAKE WITH A FULL GLASS OF WATER ON AN EMPTY STOMACH. 12 tablet 3  . aspirin 81 MG EC tablet Take 81 mg by mouth daily.      Marland Kitchen atenolol-chlorthalidone (TENORETIC) 50-25 MG tablet Take 1 tablet by mouth daily. 90 tablet 3  . Blood Glucose Calibration (ACCU-CHEK COMPACT PLUS CONTROL) SOLN Use as directed Dx E11.9 3 each 3  .  Blood Glucose Monitoring Suppl (ACCU-CHEK AVIVA PLUS) w/Device KIT Use as directed to check blood sugars everyday Dx e11.9 1 kit 0  . calcium carbonate (TUMS EX) 750 MG chewable tablet Chew 1 tablet by mouth 2 (two) times daily.      . cetirizine (ZYRTEC) 10 MG tablet Take 1 tablet (10 mg total) by mouth daily. 90 tablet 3  . Cholecalciferol (VITAMIN D3) 2000 units capsule Take 1 capsule (2,000 Units total) by mouth daily. 100 capsule 3  . co-enzyme Q-10 30 MG capsule Take 100 mg by mouth daily.    . fluticasone (FLONASE) 50 MCG/ACT nasal spray Place 2 sprays  into both nostrils daily. 48 g 3  . gabapentin (NEURONTIN) 300 MG capsule 1 pill in AM, 1 pill PM and 2 pills at night (Patient taking differently: 2 pill in AM, 2 pill PM and 2 pills at night) 360 capsule 3  . glimepiride (AMARYL) 4 MG tablet Take 1 tablet (4 mg total) by mouth 2 (two) times daily. 180 tablet 3  . glucose blood test strip Use as directed two times a day, diagnosis code E11.9 200 each 3  . HYDROcodone-acetaminophen (NORCO/VICODIN) 5-325 MG tablet Take 1 tablet by mouth every 6 (six) hours as needed for moderate pain. 30 tablet 0  . levothyroxine (SYNTHROID, LEVOTHROID) 175 MCG tablet Take 1 tablet (175 mcg total) by mouth daily before breakfast. 90 tablet 3  . meloxicam (MOBIC) 15 MG tablet Take 1 tablet (15 mg total) by mouth daily. 90 tablet 3  . metFORMIN (GLUCOPHAGE) 1000 MG tablet Take 1 tablet (1,000 mg total) by mouth 2 (two) times daily with a meal. 180 tablet 3  . pioglitazone (ACTOS) 15 MG tablet Take 1 tablet (15 mg total) by mouth daily. 90 tablet 3  . Red Yeast Rice 600 MG CAPS Take 600 mg by mouth 2 (two) times daily.     No current facility-administered medications on file prior to visit.    Review of Systems  Constitutional: Negative for other unusual diaphoresis or sweats HENT: Negative for ear discharge or swelling Eyes: Negative for other worsening visual disturbances Respiratory: Negative for stridor or other swelling  Gastrointestinal: Negative for worsening distension or other blood Genitourinary: Negative for retention or other urinary change Musculoskeletal: Negative for other MSK pain or swelling Skin: Negative for color change or other new lesions Neurological: Negative for worsening tremors and other numbness  Psychiatric/Behavioral: Negative for worsening agitation or other fatigue All other system neg per pt    Objective:   Physical Exam BP 134/88   Pulse 60   Ht _0  (1.676 m)   Wt 253 lb (114.8 kg)   SpO2 100%   BMI 40.84 kg/m  VS  noted,  Constitutional: Pt appears in NAD HENT: Head: NCAT.  Right Ear: External ear normal.  Left Ear: External ear normal.  Bilat tm's with mild erythema.  Max sinus areas non tender.  Pharynx with mild erythema, no exudateEyes: . Pupils are equal, round, and reactive to light. Conjunctivae and EOM are normal Nose: without d/c or deformity Neck: Neck supple. Gross normal ROM Cardiovascular: Normal rate and regular rhythm.   Pulmonary/Chest: Effort normal and breath sounds without rales or wheezing.  Abd:  Soft, NT, ND, + BS, no organomegaly Neurological: Pt is alert. At baseline orientation, motor grossly intact Skin: Skin is warm. No rashes, other new lesions, no LE edema Psychiatric: Pt behavior is normal without agitation  No other exam findings    Assessment &  Plan:

## 2017-01-14 NOTE — Patient Instructions (Addendum)
Please schedule the bone density test before leaving today at the scheduling desk (where you check out)  OK to change the flonase to Nasacort OTC for the allergies  Please continue all other medications as before, and refills have been done if requested.  Please have the pharmacy call with any other refills you may need.  Please continue your efforts at being more active, low cholesterol diet, and weight control  Please keep your appointments with your specialists as you may have planned  Please return in 6 months, or sooner if needed, with Lab testing done 3-5 days before

## 2017-01-20 ENCOUNTER — Ambulatory Visit
Admission: RE | Admit: 2017-01-20 | Discharge: 2017-01-20 | Disposition: A | Discharge status: Home / Self Care | Payer: Medicare HMO | Source: Ambulatory Visit | Attending: Internal Medicine | Admitting: Internal Medicine

## 2017-01-20 DIAGNOSIS — Z1231 Encounter for screening mammogram for malignant neoplasm of breast: Secondary | ICD-10-CM

## 2017-03-04 ENCOUNTER — Ambulatory Visit (INDEPENDENT_AMBULATORY_CARE_PROVIDER_SITE_OTHER): Payer: Medicare HMO | Admitting: Internal Medicine

## 2017-03-04 ENCOUNTER — Other Ambulatory Visit (INDEPENDENT_AMBULATORY_CARE_PROVIDER_SITE_OTHER): Payer: Medicare HMO

## 2017-03-04 ENCOUNTER — Encounter: Payer: Self-pay | Admitting: Internal Medicine

## 2017-03-04 VITALS — BP 140/96 | HR 83 | Ht 66.0 in | Wt 255.0 lb

## 2017-03-04 DIAGNOSIS — I10 Essential (primary) hypertension: Secondary | ICD-10-CM | POA: Diagnosis not present

## 2017-03-04 DIAGNOSIS — R1084 Generalized abdominal pain: Secondary | ICD-10-CM | POA: Insufficient documentation

## 2017-03-04 DIAGNOSIS — R748 Abnormal levels of other serum enzymes: Secondary | ICD-10-CM

## 2017-03-04 DIAGNOSIS — R7989 Other specified abnormal findings of blood chemistry: Secondary | ICD-10-CM

## 2017-03-04 DIAGNOSIS — R945 Abnormal results of liver function studies: Secondary | ICD-10-CM

## 2017-03-04 DIAGNOSIS — R109 Unspecified abdominal pain: Secondary | ICD-10-CM

## 2017-03-04 DIAGNOSIS — E118 Type 2 diabetes mellitus with unspecified complications: Secondary | ICD-10-CM

## 2017-03-04 NOTE — Patient Instructions (Signed)

## 2017-03-04 NOTE — Progress Notes (Signed)
Subjective:    Patient ID: Kelsey Keller, female    DOB: 04-18-1941, 76 y.o.   MRN: [id number]  HPI  Here with acute illness; went to beach last Friday with a friend who had similar diet. Started with onset epigastric discomfort resolved with zantac 75 mg, then with more gen'd abd pain and watery diarrhea the next day, lasting 2 days, some improved with immodium, has been in bed for several days, had a large sweat on the trip here, slept poorly and was in bed for 2 days and believes this caused her hip and shoulder aching, mild, dull, without reduction in ROM and no injury, no falls.  . Pt denies polydipsia, polyuria.  Past Medical History:  Diagnosis Date  . ALLERGIC RHINITIS 04/29/2007  . DIABETES MELLITUS, TYPE II 06/23/2007  . HIATAL HERNIA, HX OF 04/29/2007  . HYPERLIPIDEMIA 04/29/2007  . HYPERTENSION 04/29/2007  . HYPOKALEMIA 05/02/2009  . HYPOTHYROIDISM 04/29/2007  . LOW BACK PAIN 04/29/2007  . OSTEOARTHRITIS, KNEE, LEFT 06/23/2007  . OSTEOPOROSIS NOS 05/12/2007  . SPINAL STENOSIS, LUMBAR 04/29/2007  . TOE PAIN 02/20/2010   Past Surgical History:  Procedure Laterality Date  . BACK SURGERY N/A 2014   Lower back? not sure of procedure, will get medical records for practice  . CATARACT EXTRACTION W/ INTRAOCULAR LENS  IMPLANT, BILATERAL Bilateral 05/2011   doing well  . TOTAL KNEE ARTHROPLASTY  2006   Right  . TOTAL KNEE ARTHROPLASTY  2006   right    reports that she has never smoked. She has never used smokeless tobacco. She reports that she drinks alcohol. She reports that she does not use drugs. family history includes Arthritis in her brother and mother; COPD in her brother; Coronary artery disease in her father and other; Diabetes in her other; Hyperlipidemia in her brother; Hypertension in her mother; Stroke in her brother. No Known Allergies Current Outpatient Prescriptions on File Prior to Visit  Medication Sig Dispense Refill  . ACCU-CHEK SOFTCLIX LANCETS lancets 1  each by Other route 2 (two) times daily. Use to help check blood sugars twice a day Dx e11.9 100 each 3  . alendronate (FOSAMAX) 70 MG tablet TAKE 1 TABLET ONCE A WEEK. TAKE WITH A FULL GLASS OF WATER ON AN EMPTY STOMACH. 12 tablet 3  . aspirin 81 MG EC tablet Take 81 mg by mouth daily.      Marland Kitchen atenolol-chlorthalidone (TENORETIC) 50-25 MG tablet Take 1 tablet by mouth daily. 90 tablet 3  . Blood Glucose Calibration (ACCU-CHEK COMPACT PLUS CONTROL) SOLN Use as directed Dx E11.9 3 each 3  . Blood Glucose Monitoring Suppl (ACCU-CHEK AVIVA PLUS) w/Device KIT Use as directed to check blood sugars everyday Dx e11.9 1 kit 0  . calcium carbonate (TUMS EX) 750 MG chewable tablet Chew 1 tablet by mouth 2 (two) times daily.      . cetirizine (ZYRTEC) 10 MG tablet Take 1 tablet (10 mg total) by mouth daily. 90 tablet 3  . Cholecalciferol (VITAMIN D3) 2000 units capsule Take 1 capsule (2,000 Units total) by mouth daily. 100 capsule 3  . co-enzyme Q-10 30 MG capsule Take 100 mg by mouth daily.    . fluticasone (FLONASE) 50 MCG/ACT nasal spray Place 2 sprays into both nostrils daily. 48 g 3  . gabapentin (NEURONTIN) 300 MG capsule 1 pill in AM, 1 pill PM and 2 pills at night (Patient taking differently: 2 pill in AM, 2 pill PM and 2 pills at night) 360 capsule  3  . glimepiride (AMARYL) 4 MG tablet Take 1 tablet (4 mg total) by mouth 2 (two) times daily. 180 tablet 3  . glucose blood test strip Use as directed two times a day, diagnosis code E11.9 200 each 3  . HYDROcodone-acetaminophen (NORCO/VICODIN) 5-325 MG tablet Take 1 tablet by mouth every 6 (six) hours as needed for moderate pain. 30 tablet 0  . levothyroxine (SYNTHROID, LEVOTHROID) 175 MCG tablet Take 1 tablet (175 mcg total) by mouth daily before breakfast. 90 tablet 3  . meloxicam (MOBIC) 15 MG tablet Take 1 tablet (15 mg total) by mouth daily. 90 tablet 3  . metFORMIN (GLUCOPHAGE) 1000 MG tablet Take 1 tablet (1,000 mg total) by mouth 2 (two) times  daily with a meal. 180 tablet 3  . pioglitazone (ACTOS) 15 MG tablet Take 1 tablet (15 mg total) by mouth daily. 90 tablet 3  . Red Yeast Rice 600 MG CAPS Take 600 mg by mouth 2 (two) times daily.    . traMADol (ULTRAM) 50 MG tablet Take 1 tablet (50 mg total) by mouth every 6 (six) hours as needed. 60 tablet 2  . triamcinolone (NASACORT) 55 MCG/ACT AERO nasal inhaler Place 2 sprays into the nose daily. 1 Inhaler 12   No current facility-administered medications on file prior to visit.    Review of Systems  Constitutional: Negative for other unusual diaphoresis or sweats HENT: Negative for ear discharge or swelling Eyes: Negative for other worsening visual disturbances Respiratory: Negative for stridor or other swelling  Gastrointestinal: Negative for worsening distension or other blood Genitourinary: Negative for retention or other urinary change Musculoskeletal: Negative for other MSK pain or swelling Skin: Negative for color change or other new lesions Neurological: Negative for worsening tremors and other numbness  Psychiatric/Behavioral: Negative for worsening agitation or other fatigue All other system neg per pt    Objective:   Physical Exam BP (!) 140/96   Pulse 83   Ht '5\' 6"'  (1.676 m)   Wt 255 lb (115.7 kg)   SpO2 97%   BMI 41.16 kg/m  Appears to have sweat in the car here as hair all wet VS noted, mild ill, fatigued appearing Constitutional: Pt appears in NAD HENT: Head: NCAT.  Right Ear: External ear normal.  Left Ear: External ear normal.  Eyes: . Pupils are equal, round, and reactive to light. Conjunctivae and EOM are normal Nose: without d/c or deformity Neck: Neck supple. Gross normal ROM Cardiovascular: Normal rate and regular rhythm.   Pulmonary/Chest: Effort normal and breath sounds without rales or wheezing.  Abd:  Soft, NT, ND, + BS, no organomegaly - benign exam Neurological: Pt is alert. At baseline orientation, motor grossly intact Skin: Skin is warm.  No rashes, other new lesions, no LE edema Psychiatric: Pt behavior is normal without agitation  No other exam findings Lab Results  Component Value Date   WBC 4.7 07/02/2016   HGB 13.1 07/02/2016   HCT 38.3 07/02/2016   PLT 171.0 07/02/2016   GLUCOSE 106 (H) 01/02/2017   CHOL 165 01/02/2017   TRIG 84.0 01/02/2017   HDL 76.80 01/02/2017   LDLDIRECT 107.5 07/21/2013   LDLCALC 71 01/02/2017   ALT 35 01/02/2017   AST 35 01/02/2017   NA 138 01/02/2017   K 4.3 01/02/2017   CL 100 01/02/2017   CREATININE 0.99 01/02/2017   BUN 34 (H) 01/02/2017   CO2 32 01/02/2017   TSH 5.50 (H) 07/02/2016   INR 0.90 11/06/2010   HGBA1C  7.1 (H) 01/02/2017   MICROALBUR <0.7 07/02/2016      Assessment & Plan:

## 2017-03-04 NOTE — Assessment & Plan Note (Signed)
stable overall by history and exam, recent data reviewed with pt, and pt to continue medical treatment as before,  to f/u any worsening symptoms or concerns Lab Results  Component Value Date   HGBA1C 7.1 (H) 01/02/2017

## 2017-03-04 NOTE — Assessment & Plan Note (Signed)
Now improved, exam benign, I suspect viral illness most likely, to cont immodium prn, but will need labs as ordered including cbc; if abnormal may warrant further evaluation and tx

## 2017-03-04 NOTE — Assessment & Plan Note (Signed)
stable overall by history and exam, recent data reviewed with pt, and pt to continue medical treatment as before,  to f/u any worsening symptoms or concerns BP Readings from Last 3 Encounters:  03/04/17 (!) 140/96  01/14/17 134/88  01/06/17 117/63

## 2017-03-05 ENCOUNTER — Telehealth: Payer: Self-pay | Admitting: Internal Medicine

## 2017-03-05 LAB — URINALYSIS, ROUTINE W REFLEX MICROSCOPIC
KETONES UR: NEGATIVE
NITRITE: POSITIVE — AB
PH: 6 (ref 5.0–8.0)
SPECIFIC GRAVITY, URINE: 1.025 (ref 1.000–1.030)
Total Protein, Urine: 100 — AB
Urine Glucose: 100 — AB
Urobilinogen, UA: 2 — AB (ref 0.0–1.0)

## 2017-03-05 LAB — BASIC METABOLIC PANEL
BUN: 30 mg/dL — AB (ref 6–23)
CALCIUM: 9.9 mg/dL (ref 8.4–10.5)
CO2: 27 meq/L (ref 19–32)
CREATININE: 1.24 mg/dL — AB (ref 0.40–1.20)
Chloride: 95 mEq/L — ABNORMAL LOW (ref 96–112)
GFR: 44.9 mL/min — ABNORMAL LOW (ref >60.00)
GLUCOSE: 194 mg/dL — AB (ref 70–99)
Potassium: 3.8 mEq/L (ref 3.5–5.1)
Sodium: 133 mEq/L — ABNORMAL LOW (ref 135–145)

## 2017-03-05 LAB — CBC WITH DIFFERENTIAL/PLATELET
Basophils Absolute: 0.1 10*3/uL (ref 0.0–0.1)
Basophils Relative: 1.2 % (ref 0.0–3.0)
Eosinophils Absolute: 0 10*3/uL (ref 0.0–0.7)
Eosinophils Relative: 0.3 % (ref 0.0–5.0)
HCT: 37.8 % (ref 36.0–46.0)
Hemoglobin: 12.7 g/dL (ref 12.0–15.0)
LYMPHS ABS: 0.4 10*3/uL — AB (ref 0.7–4.0)
LYMPHS PCT: 5.2 % — AB (ref 12.0–46.0)
MCHC: 33.7 g/dL (ref 30.0–36.0)
MCV: 96.4 fl (ref 78.0–100.0)
MONOS PCT: 10.1 % (ref 3.0–12.0)
Monocytes Absolute: 0.8 10*3/uL (ref 0.1–1.0)
NEUTROS PCT: 83.2 % — AB (ref 43.0–77.0)
Neutro Abs: 6.8 10*3/uL (ref 1.4–7.7)
Platelets: 142 10*3/uL — ABNORMAL LOW (ref 150.0–400.0)
RBC: 3.92 Mil/uL (ref 3.87–5.11)
RDW: 14.7 % (ref 11.5–15.5)
WBC: 8.1 10*3/uL (ref 4.0–10.5)

## 2017-03-05 LAB — HEPATIC FUNCTION PANEL
ALBUMIN: 3.8 g/dL (ref 3.5–5.2)
ALK PHOS: 100 U/L (ref 39–117)
ALT: 102 U/L — ABNORMAL HIGH (ref 0–35)
AST: 84 U/L — ABNORMAL HIGH (ref 0–37)
Bilirubin, Direct: 4.8 mg/dL — ABNORMAL HIGH (ref 0.0–0.3)
Total Bilirubin: 7.1 mg/dL — ABNORMAL HIGH (ref 0.2–1.2)
Total Protein: 6.9 g/dL (ref 6.0–8.3)

## 2017-03-05 LAB — LIPASE: Lipase: 131 U/L — ABNORMAL HIGH (ref 11.0–59.0)

## 2017-03-05 NOTE — Telephone Encounter (Signed)
Pt called for lab results from 8/21, please call when received and resulted

## 2017-03-06 NOTE — Progress Notes (Signed)
none

## 2017-03-09 ENCOUNTER — Encounter: Payer: Self-pay | Admitting: Internal Medicine

## 2017-03-10 ENCOUNTER — Other Ambulatory Visit (INDEPENDENT_AMBULATORY_CARE_PROVIDER_SITE_OTHER): Payer: Medicare HMO

## 2017-03-10 DIAGNOSIS — R1084 Generalized abdominal pain: Secondary | ICD-10-CM

## 2017-03-10 DIAGNOSIS — R945 Abnormal results of liver function studies: Secondary | ICD-10-CM | POA: Diagnosis not present

## 2017-03-10 DIAGNOSIS — R7989 Other specified abnormal findings of blood chemistry: Secondary | ICD-10-CM

## 2017-03-10 DIAGNOSIS — R748 Abnormal levels of other serum enzymes: Secondary | ICD-10-CM | POA: Diagnosis not present

## 2017-03-10 LAB — HEPATIC FUNCTION PANEL
ALT: 82 U/L — ABNORMAL HIGH (ref 0–35)
AST: 93 U/L — ABNORMAL HIGH (ref 0–37)
Albumin: 3.5 g/dL (ref 3.5–5.2)
Alkaline Phosphatase: 264 U/L — ABNORMAL HIGH (ref 39–117)
Bilirubin, Direct: 1.9 mg/dL — ABNORMAL HIGH (ref 0.0–0.3)
Total Bilirubin: 3.2 mg/dL — ABNORMAL HIGH (ref 0.2–1.2)
Total Protein: 6.9 g/dL (ref 6.0–8.3)

## 2017-03-10 LAB — BASIC METABOLIC PANEL
BUN: 20 mg/dL (ref 6–23)
CALCIUM: 10.2 mg/dL (ref 8.4–10.5)
CO2: 33 mEq/L — ABNORMAL HIGH (ref 19–32)
Chloride: 97 mEq/L (ref 96–112)
Creatinine, Ser: 0.95 mg/dL (ref 0.40–1.20)
GFR: 61.06 mL/min (ref >60.00)
GLUCOSE: 101 mg/dL — AB (ref 70–99)
Potassium: 3.3 mEq/L — ABNORMAL LOW (ref 3.5–5.1)
SODIUM: 138 meq/L (ref 135–145)

## 2017-03-10 LAB — CBC WITH DIFFERENTIAL/PLATELET
Basophils Absolute: 0 10*3/uL (ref 0.0–0.1)
Basophils Relative: 0.7 % (ref 0.0–3.0)
EOS PCT: 2.6 % (ref 0.0–5.0)
Eosinophils Absolute: 0.2 10*3/uL (ref 0.0–0.7)
HCT: 37.7 % (ref 36.0–46.0)
HEMOGLOBIN: 12.6 g/dL (ref 12.0–15.0)
Lymphocytes Relative: 13.1 % (ref 12.0–46.0)
Lymphs Abs: 0.8 10*3/uL (ref 0.7–4.0)
MCHC: 33.4 g/dL (ref 30.0–36.0)
MCV: 96.5 fl (ref 78.0–100.0)
MONOS PCT: 11.2 % (ref 3.0–12.0)
Monocytes Absolute: 0.7 10*3/uL (ref 0.1–1.0)
Neutro Abs: 4.5 10*3/uL (ref 1.4–7.7)
Neutrophils Relative %: 72.4 % (ref 43.0–77.0)
Platelets: 285 10*3/uL (ref 150.0–400.0)
RBC: 3.91 Mil/uL (ref 3.87–5.11)
RDW: 14.8 % (ref 11.5–15.5)
WBC: 6.3 10*3/uL (ref 4.0–10.5)

## 2017-03-10 LAB — PROTIME-INR
INR: 1.1 ratio — ABNORMAL HIGH (ref 0.8–1.0)
Prothrombin Time: 11.8 s (ref 9.6–13.1)

## 2017-03-10 LAB — LIPASE: Lipase: 39 U/L (ref 11.0–59.0)

## 2017-03-11 ENCOUNTER — Other Ambulatory Visit: Payer: Self-pay | Admitting: Internal Medicine

## 2017-03-11 MED ORDER — POTASSIUM CHLORIDE ER 10 MEQ PO TBCR: 10.0000 meq | EXTENDED_RELEASE_TABLET | Freq: Every day | ORAL | 0 refills | Status: DC

## 2017-03-12 ENCOUNTER — Ambulatory Visit (INDEPENDENT_AMBULATORY_CARE_PROVIDER_SITE_OTHER)
Admission: RE | Admit: 2017-03-12 | Discharge: 2017-03-12 | Disposition: A | Discharge status: Home / Self Care | Payer: Medicare HMO | Source: Ambulatory Visit | Attending: Internal Medicine | Admitting: Internal Medicine

## 2017-03-12 ENCOUNTER — Telehealth: Payer: Self-pay

## 2017-03-12 ENCOUNTER — Other Ambulatory Visit: Payer: Self-pay | Admitting: Internal Medicine

## 2017-03-12 DIAGNOSIS — R1084 Generalized abdominal pain: Secondary | ICD-10-CM | POA: Diagnosis not present

## 2017-03-12 DIAGNOSIS — R109 Unspecified abdominal pain: Secondary | ICD-10-CM

## 2017-03-12 DIAGNOSIS — R1013 Epigastric pain: Secondary | ICD-10-CM | POA: Diagnosis not present

## 2017-03-12 DIAGNOSIS — R748 Abnormal levels of other serum enzymes: Secondary | ICD-10-CM

## 2017-03-12 DIAGNOSIS — R7989 Other specified abnormal findings of blood chemistry: Secondary | ICD-10-CM

## 2017-03-12 DIAGNOSIS — R945 Abnormal results of liver function studies: Secondary | ICD-10-CM | POA: Diagnosis not present

## 2017-03-12 DIAGNOSIS — K811 Chronic cholecystitis: Secondary | ICD-10-CM

## 2017-03-12 DIAGNOSIS — K7460 Unspecified cirrhosis of liver: Secondary | ICD-10-CM

## 2017-03-12 MED ORDER — IOPAMIDOL (ISOVUE-300) INJECTION 61%
100.0000 mL | Freq: Once | INTRAVENOUS | Status: AC | PRN
  Administered 2017-03-12: 100 mL via INTRAVENOUS

## 2017-03-12 MED ORDER — POTASSIUM CHLORIDE ER 10 MEQ PO TBCR
10.0000 meq | EXTENDED_RELEASE_TABLET | Freq: Every day | ORAL | 0 refills | Status: DC
Start: 2017-03-12 — End: 2017-04-10

## 2017-03-12 NOTE — Telephone Encounter (Signed)
Called pt, LVM.   

## 2017-03-12 NOTE — Telephone Encounter (Signed)
Pt has viewed results via MyChart  

## 2017-03-12 NOTE — Addendum Note (Signed)
Addended by: Juliet Rude on: 03/12/2017 11:11 AM   Modules accepted: Orders

## 2017-03-12 NOTE — Telephone Encounter (Signed)
-----   Message from Biagio Borg, MD sent at 03/11/2017  9:22 PM EDT ----- Left message on MyChart, pt to cont same tx except  The test results show that your current treatment is OK, except the liver tests are still mildly elevated, and the potassium is mildly low.  We will look forward to your CT scan results, and please take potassium pill - 1 per day for 5 days.  You should hear from the office about this as well.    Kelsey Keller to please inform pt, I will do rx for K

## 2017-03-12 NOTE — Telephone Encounter (Signed)
-----   Message from Biagio Borg, MD sent at 03/12/2017  3:40 PM EDT ----- Left message on MyChart, pt to cont same tx except  The test results show that your current treatment is OK, except you do most likely have evidence for "chronic cholecystitis"  We need to refer you to General Surgury for further evaluation. You also appear to have evidence for cirrhosis oif the liver which was not known before, and I would like to refer you to Gastroenterology as well.  Shirron to please inform pt, I will do referral x 2

## 2017-03-12 NOTE — Telephone Encounter (Signed)
Pt called stating that the potassium prescription should [facility] [address].

## 2017-03-12 NOTE — Telephone Encounter (Signed)
Corrected and sent

## 2017-03-13 ENCOUNTER — Other Ambulatory Visit: Payer: Self-pay

## 2017-03-13 NOTE — Telephone Encounter (Signed)
Pt has been informed and expressed understanding.  

## 2017-03-13 NOTE — Telephone Encounter (Signed)
Pt called for her lab results please call back

## 2017-03-15 DIAGNOSIS — R748 Abnormal levels of other serum enzymes: Secondary | ICD-10-CM

## 2017-03-15 HISTORY — DX: Abnormal levels of other serum enzymes: R74.8

## 2017-03-18 ENCOUNTER — Telehealth: Payer: Self-pay | Admitting: Internal Medicine

## 2017-03-18 ENCOUNTER — Encounter: Payer: Self-pay | Admitting: Physician Assistant

## 2017-03-18 NOTE — Telephone Encounter (Signed)
Pt called regarding her referral to [facility],  They have not received the referral she talked to them today  Please fax to [phone] Please call back when done

## 2017-03-18 NOTE — Telephone Encounter (Signed)
Referral has been faxed 2x. Spoke to [facility] and they said it can take up to 24hrs and she should hear from them by tomorrow afternoon. Pt aware

## 2017-03-25 DIAGNOSIS — K7460 Unspecified cirrhosis of liver: Secondary | ICD-10-CM | POA: Diagnosis not present

## 2017-03-25 DIAGNOSIS — K8020 Calculus of gallbladder without cholecystitis without obstruction: Secondary | ICD-10-CM | POA: Diagnosis not present

## 2017-03-26 ENCOUNTER — Other Ambulatory Visit: Payer: Self-pay | Admitting: General Surgery

## 2017-03-26 DIAGNOSIS — K8021 Calculus of gallbladder without cholecystitis with obstruction: Secondary | ICD-10-CM

## 2017-03-28 ENCOUNTER — Ambulatory Visit
Admission: RE | Admit: 2017-03-28 | Discharge: 2017-03-28 | Disposition: A | Discharge status: Home / Self Care | Payer: Medicare HMO | Source: Ambulatory Visit | Attending: General Surgery | Admitting: General Surgery

## 2017-03-28 DIAGNOSIS — K8021 Calculus of gallbladder without cholecystitis with obstruction: Secondary | ICD-10-CM

## 2017-03-28 DIAGNOSIS — K8020 Calculus of gallbladder without cholecystitis without obstruction: Secondary | ICD-10-CM | POA: Diagnosis not present

## 2017-03-28 MED ORDER — GADOBENATE DIMEGLUMINE 529 MG/ML IV SOLN
20.0000 mL | Freq: Once | INTRAVENOUS | Status: AC | PRN
  Administered 2017-03-28: 20 mL via INTRAVENOUS

## 2017-04-01 ENCOUNTER — Ambulatory Visit (INDEPENDENT_AMBULATORY_CARE_PROVIDER_SITE_OTHER): Payer: Medicare HMO | Admitting: Physician Assistant

## 2017-04-01 ENCOUNTER — Other Ambulatory Visit (INDEPENDENT_AMBULATORY_CARE_PROVIDER_SITE_OTHER): Payer: Medicare HMO

## 2017-04-01 ENCOUNTER — Encounter: Payer: Self-pay | Admitting: Physician Assistant

## 2017-04-01 VITALS — BP 142/84 | HR 78 | Ht 66.0 in | Wt 255.0 lb

## 2017-04-01 DIAGNOSIS — R160 Hepatomegaly, not elsewhere classified: Secondary | ICD-10-CM

## 2017-04-01 DIAGNOSIS — R945 Abnormal results of liver function studies: Secondary | ICD-10-CM | POA: Diagnosis not present

## 2017-04-01 DIAGNOSIS — R7989 Other specified abnormal findings of blood chemistry: Secondary | ICD-10-CM

## 2017-04-01 DIAGNOSIS — K7469 Other cirrhosis of liver: Secondary | ICD-10-CM | POA: Diagnosis not present

## 2017-04-01 DIAGNOSIS — K8020 Calculus of gallbladder without cholecystitis without obstruction: Secondary | ICD-10-CM

## 2017-04-01 DIAGNOSIS — R1013 Epigastric pain: Secondary | ICD-10-CM

## 2017-04-01 DIAGNOSIS — K7460 Unspecified cirrhosis of liver: Secondary | ICD-10-CM | POA: Diagnosis not present

## 2017-04-01 LAB — FERRITIN: Ferritin: 129.6 ng/mL (ref 10.0–291.0)

## 2017-04-01 MED ORDER — RANITIDINE HCL 150 MG PO TABS
150.0000 mg | ORAL_TABLET | Freq: Two times a day (BID) | ORAL | 3 refills | Status: DC
Start: 2017-04-01 — End: 2017-04-04

## 2017-04-01 NOTE — Patient Instructions (Signed)
Please go to the basement level to have your labs drawn.  We have sent the following medications to your pharmacy for you to pick up at your convenience: 1. Zantac 150 mg , take 1 tab twice daily.   You have been scheduled for an endoscopy. Please follow written instructions given to you at your visit today. If you use inhalers (even only as needed), please bring them with you on the day of your procedure.

## 2017-04-01 NOTE — Progress Notes (Signed)
Subjective:    Patient ID: Kelsey Keller, female    DOB: August 14, 1940, 76 y.o.   MRN: [id number]  HPI Kelsey Keller is a pleasant 76 year old white female, new to GI today referred by Dr. Cathlean Cower for abnormal LFTs and CT imaging. Patient has history of hypertension, adult-onset diabetes mellitus and spinal stenosis. She reports having colonoscopy done in 2007, somewhere in [place] though she does not recall where. She was told that this was negative with no polyps. She also completed a Cologuard last year which was negative.  Patient has undergone recent evaluation after she became ill in August while she was vacationing at [employer]. She said she was feeling fine prior to leaving for vacation and had not had any recent GI symptoms. She says she developed of fatigue mild diarrhea, and then stayed in bed for about 48 hours feeling very poorly with mild upper abdominal discomfort. She did not have any documented fever but did have sweats, diarrhea quickly subsided and she had no nausea or vomiting. She came to see Dr. Jenny Reichmann a couple of days later and says ever since then she has not had any energy and is felt completely fatigued. She has had mild epigastric discomfort that comes and goes. She has been using Zantac which she finds somewhat helpful. She says perhaps a couple of times per week she'll have a more significant epigastric discomfort that she takes tramadol for has not had any radiation of pain into her chest or into her back no further fever or sweats. She has not been told of any liver disease in the past there was told about 20 years ago that she had a fatty liver. Is no family history of liver disease that she is aware of, she is a nondrinker and no history of hepatitis. Fairly extensive workup to date with labs on 03/04/2017 total bili of since 7.1 direct bili 4.8 AST of 84 ALT of 102 WBC 8.1 hemoglobin 12.7 platelets 142 BUN 30 and creatinine 1.24 Labs repeated a 20 7T bili 3.2 alkaline  phosphatase 264 AST 93 and ALT of 82. Labs repeated per Dr. Donne Hazel last week with T bili of 1.4 direct bili 0.6, alkaline phosphatase 114 AST 32, ALT 25, albumin 3.5. CT abdomen and pelvis done on 03/12/2017 showed a nodular liver consistent with cirrhosis, there is a 9 mm lesion at the right hepatic dome too small to characterize, no biliary dilation, she did have a thick-walled gallbladder with multiple gallstones, no splenomegaly but noted splenic varices. Patient was referred to Dr. Donne Hazel who saw her and ordered MRI of the abdomen , which shows irregular hepatic contour, small layering gallstones normal caliber common bile duct, no gallbladder wall thickening or pericholecystic fluid, no intrahepatic biliary dilation. Spleen normal. There is a peripherally enhancing T2 hyperintense and T1 hypointense lesion within segment 8 of the liver, measuring 1.8 cm, and indeterminant. Presence of arterial phase rim/peripheral is a worrisome feature for non-HCC malignancy, consider biopsy.  Review of Systems Pertinent positive and negative review of systems were noted in the above HPI section.  All other review of systems was otherwise negative.  Outpatient Encounter Prescriptions as of 04/01/2017  Medication Sig  . ACCU-CHEK SOFTCLIX LANCETS lancets 1 each by Other route 2 (two) times daily. Use to help check blood sugars twice a day Dx e11.9  . alendronate (FOSAMAX) 70 MG tablet TAKE 1 TABLET ONCE A WEEK. TAKE WITH A FULL GLASS OF WATER ON AN EMPTY STOMACH.  Marland Kitchen aspirin  81 MG EC tablet Take 81 mg by mouth daily.    Marland Kitchen atenolol-chlorthalidone (TENORETIC) 50-25 MG tablet Take 1 tablet by mouth daily.  . Blood Glucose Calibration (ACCU-CHEK COMPACT PLUS CONTROL) SOLN Use as directed Dx E11.9  . Blood Glucose Monitoring Suppl (ACCU-CHEK AVIVA PLUS) w/Device KIT Use as directed to check blood sugars everyday Dx e11.9  . calcium carbonate (TUMS EX) 750 MG chewable tablet Chew 1 tablet by mouth 2 (two) times  daily.    . cetirizine (ZYRTEC) 10 MG tablet Take 1 tablet (10 mg total) by mouth daily.  . Cholecalciferol (VITAMIN D3) 2000 units capsule Take 1 capsule (2,000 Units total) by mouth daily.  Marland Kitchen co-enzyme Q-10 30 MG capsule Take 100 mg by mouth daily.  . fluticasone (FLONASE) 50 MCG/ACT nasal spray Place 2 sprays into both nostrils daily.  Marland Kitchen gabapentin (NEURONTIN) 300 MG capsule 1 pill in AM, 1 pill PM and 2 pills at night (Patient taking differently: 2 pill in AM, 2 pill PM and 2 pills at night)  . glimepiride (AMARYL) 4 MG tablet Take 1 tablet (4 mg total) by mouth 2 (two) times daily.  Marland Kitchen glucose blood test strip Use as directed two times a day, diagnosis code E11.9  . HYDROcodone-acetaminophen (NORCO/VICODIN) 5-325 MG tablet Take 1 tablet by mouth every 6 (six) hours as needed for moderate pain.  Marland Kitchen levothyroxine (SYNTHROID, LEVOTHROID) 175 MCG tablet Take 1 tablet (175 mcg total) by mouth daily before breakfast.  . meloxicam (MOBIC) 15 MG tablet Take 1 tablet (15 mg total) by mouth daily.  . metFORMIN (GLUCOPHAGE) 1000 MG tablet Take 1 tablet (1,000 mg total) by mouth 2 (two) times daily with a meal.  . pioglitazone (ACTOS) 15 MG tablet Take 1 tablet (15 mg total) by mouth daily.  . ranitidine (ZANTAC) 75 MG tablet Take 75 mg by mouth as needed for heartburn.  . Red Yeast Rice 600 MG CAPS Take 600 mg by mouth 2 (two) times daily.  . traMADol (ULTRAM) 50 MG tablet Take 1 tablet (50 mg total) by mouth every 6 (six) hours as needed.  . triamcinolone (NASACORT) 55 MCG/ACT AERO nasal inhaler Place 2 sprays into the nose daily.  . potassium chloride (K-DUR) 10 MEQ tablet Take 1 tablet (10 mEq total) by mouth daily.  . ranitidine (ZANTAC) 150 MG tablet Take 1 tablet (150 mg total) by mouth 2 (two) times daily.   No facility-administered encounter medications on file as of 04/01/2017.    No Known Allergies Patient Active Problem List   Diagnosis Date Noted  . Generalized abdominal pain 03/04/2017    . Hypercalcemia 01/17/2016  . Vitamin D deficiency 01/17/2016  . Right ankle pain 01/17/2016  . Loosening of hardware in spine ([place]) 01/02/2016  . Lumbar disc disease 01/17/2015  . Urinary incontinence in female 01/17/2015  . Cough 07/26/2014  . Skin lesion 01/21/2014  . Obesity 09/18/2011  . Preoperative examination, unspecified 10/15/2010  . Encounter for well adult exam with abnormal findings 10/15/2010  . Diabetes ([place]) 06/23/2007  . OSTEOARTHRITIS, KNEE, LEFT 06/23/2007  . OSTEOPOROSIS NOS 05/12/2007  . Hypothyroidism 04/29/2007  . Hyperlipidemia 04/29/2007  . Essential hypertension 04/29/2007  . Allergic rhinitis 04/29/2007  . SPINAL STENOSIS, LUMBAR 04/29/2007  . LOW BACK PAIN 04/29/2007  . HIATAL HERNIA, HX OF 04/29/2007   Social History   Social History  . Marital status: Divorced    Spouse name: N/A  . Number of children: N/A  . Years of education: N/A  Occupational History  . 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 Smoking status: Never Smoker  . Smokeless tobacco: Never Used  . Alcohol use 0.0 oz/week     Comment: One or two drinks in month  . Drug use: No  . Sexual activity: Not on file   Other Topics Concern  . Not on file   Social History Narrative  . No narrative on file    Kelsey Keller's family history includes Arthritis in her brother and mother; COPD in her brother; Coronary artery disease in her father and other; Diabetes in her other; Hyperlipidemia in her brother; Hypertension in her mother; Stroke in her brother.      Objective:    Vitals:   04/01/17 0949  BP: (!) 142/84  Pulse: 78  SpO2: 96%    Physical Exam well-developed older white female in no acute distress, accompanied by her daughter, both pleasant blood pressure 142/84 pulse 78, height 5 foot 6, weight 255, BMI 41.1. HEENT; nontraumatic normocephalic EOMI PERRLA sclera slightly icteric, Cardiovascular; regular rate and rhythm with S1-S2 no murmur or gallop,  Pulmonary ;clear bilaterally, Abdomen, soft has some mild tenderness in the epigastrium there is no guarding or rebound no palpable mass or hepatosplenomegaly bowel sounds are present, no fluid wave, Rectal; exam not done, Extremities; no clubbing cyanosis or edema skin warm and dry, Neuropsych; mood and affect appropriate       Assessment & Plan:   #64 76 year old white female with onset of illness about one month ago with fatigue, epigastric discomfort sweats and possible fever. Since then she has had ongoing significant fatigue and intermittent episodes of epigastric pain, and rather constant epigastric discomfort. Acute illness associated with elevated LFTs and hyperbilirubinemia which have since normalized. Initial CT concerning for cholecystitis with gallbladder wall thickening and multiple stones. 2 weeks later MRI shows no evidence of gallbladder wall thickening though symptoms are certainly consistent with gallbladder disease.  #2 new diagnosis of cirrhosis, etiology not clear though suspect Karlene Lineman. She had abdominal ultrasound done in 2001 showing a fatty liver, and review of labs over the past few years show that she has had some mild transaminase elevation.  #3 right hepatic lobe indeterminate lesion, measuring 1.8 cm and concerning for non-HCC malignancy on MRI. #4 evidence for portal hypertension with splenic varices on CT #5 adult-onset diabetes mellitus, #6 hypertension #7 obesity  Plan; Complicated situation discussed with agent and her daughter and recent findings on imaging and labs explained. She will need cholecystectomy per Dr. Donne Hazel, but it will work up further from hepatic standpoint. She's not had any evidence of severe hepatic decompensation to date, Will check hepatitis serologies, and genetic/autoimmune markers to rule out other forms of chronic liver disease. Recent INR normal at 1.1 Check AFP and CEA We will contact IR, and asked them to review CT and MRI for  feasibility of biopsy of the right hepatic dome lesion. Patient has also been scheduled for EGD with Dr. Ardis Hughs, for variceal screening. She will need repeat labs done within the next 2 weeks and will plan quick office follow-up as well. For now she will take Zantac 150 twice a day, follow low-fat bland diet, and it was advised to seek ER care should she have any unrelenting episode of upper abdominal pain, nausea vomiting or associated fever.    Amy Genia Harold PA-C 04/01/2017   Cc: Biagio Borg, MD

## 2017-04-02 LAB — ALPHA-1-ANTITRYPSIN: A1 ANTITRYPSIN SER: 166 mg/dL (ref 83–199)

## 2017-04-02 LAB — HEPATITIS A ANTIBODY, TOTAL: Hepatitis A AB,Total: NONREACTIVE

## 2017-04-02 LAB — HEPATITIS C ANTIBODY
HEP C AB: NONREACTIVE
SIGNAL TO CUT-OFF: 0.02 (ref <1.00)

## 2017-04-02 LAB — AFP TUMOR MARKER: AFP TUMOR MARKER: 3 ng/mL

## 2017-04-02 LAB — ANTI-NUCLEAR AB-TITER (ANA TITER)

## 2017-04-02 LAB — HEPATITIS B SURFACE ANTIBODY,QUALITATIVE: Hep B S Ab: NONREACTIVE

## 2017-04-02 LAB — CERULOPLASMIN: Ceruloplasmin: 32 mg/dL (ref 18–53)

## 2017-04-02 LAB — MITOCHONDRIAL ANTIBODIES

## 2017-04-02 LAB — ANTI-SMOOTH MUSCLE ANTIBODY, IGG: ACTIN (SMOOTH MUSCLE) ANTIBODY (IGG): 24 U — AB

## 2017-04-02 LAB — HEPATITIS B SURFACE ANTIGEN: HEP B S AG: NONREACTIVE

## 2017-04-02 LAB — ANA: Anti Nuclear Antibody(ANA): POSITIVE — AB

## 2017-04-02 LAB — CEA: CEA: 3.9 ng/mL — ABNORMAL HIGH

## 2017-04-02 NOTE — Progress Notes (Signed)
I agree with the above note, plan 

## 2017-04-04 ENCOUNTER — Telehealth: Payer: Self-pay | Admitting: *Deleted

## 2017-04-04 ENCOUNTER — Ambulatory Visit (AMBULATORY_SURGERY_CENTER): Payer: Medicare HMO | Admitting: Gastroenterology

## 2017-04-04 ENCOUNTER — Other Ambulatory Visit: Payer: Self-pay

## 2017-04-04 ENCOUNTER — Other Ambulatory Visit (INDEPENDENT_AMBULATORY_CARE_PROVIDER_SITE_OTHER): Payer: Medicare HMO

## 2017-04-04 ENCOUNTER — Encounter: Payer: Self-pay | Admitting: Gastroenterology

## 2017-04-04 VITALS — BP 120/51 | HR 60 | Temp 97.8°F | Resp 15 | Ht 66.0 in | Wt 255.0 lb

## 2017-04-04 DIAGNOSIS — K2990 Gastroduodenitis, unspecified, without bleeding: Secondary | ICD-10-CM | POA: Diagnosis not present

## 2017-04-04 DIAGNOSIS — K3189 Other diseases of stomach and duodenum: Secondary | ICD-10-CM | POA: Diagnosis not present

## 2017-04-04 DIAGNOSIS — K769 Liver disease, unspecified: Secondary | ICD-10-CM

## 2017-04-04 DIAGNOSIS — R970 Elevated carcinoembryonic antigen [CEA]: Secondary | ICD-10-CM

## 2017-04-04 DIAGNOSIS — K7460 Unspecified cirrhosis of liver: Secondary | ICD-10-CM | POA: Diagnosis not present

## 2017-04-04 DIAGNOSIS — R945 Abnormal results of liver function studies: Secondary | ICD-10-CM | POA: Diagnosis not present

## 2017-04-04 DIAGNOSIS — K2970 Gastritis, unspecified, without bleeding: Secondary | ICD-10-CM | POA: Diagnosis not present

## 2017-04-04 DIAGNOSIS — I10 Essential (primary) hypertension: Secondary | ICD-10-CM | POA: Diagnosis not present

## 2017-04-04 DIAGNOSIS — E119 Type 2 diabetes mellitus without complications: Secondary | ICD-10-CM

## 2017-04-04 DIAGNOSIS — E039 Hypothyroidism, unspecified: Secondary | ICD-10-CM | POA: Diagnosis not present

## 2017-04-04 DIAGNOSIS — K269 Duodenal ulcer, unspecified as acute or chronic, without hemorrhage or perforation: Secondary | ICD-10-CM

## 2017-04-04 DIAGNOSIS — R1013 Epigastric pain: Secondary | ICD-10-CM | POA: Diagnosis present

## 2017-04-04 LAB — COMPREHENSIVE METABOLIC PANEL
ALT: 21 U/L (ref 0–35)
AST: 34 U/L (ref 0–37)
Albumin: 3.7 g/dL (ref 3.5–5.2)
Alkaline Phosphatase: 72 U/L (ref 39–117)
BUN: 20 mg/dL (ref 6–23)
CALCIUM: 10.5 mg/dL (ref 8.4–10.5)
CHLORIDE: 101 meq/L (ref 96–112)
CO2: 31 meq/L (ref 19–32)
CREATININE: 0.87 mg/dL (ref 0.40–1.20)
GFR: 67.57 mL/min (ref >60.00)
Glucose, Bld: 122 mg/dL — ABNORMAL HIGH (ref 70–99)
Potassium: 4.3 mEq/L (ref 3.5–5.1)
SODIUM: 138 meq/L (ref 135–145)
Total Bilirubin: 1.7 mg/dL — ABNORMAL HIGH (ref 0.2–1.2)
Total Protein: 6.6 g/dL (ref 6.0–8.3)

## 2017-04-04 MED ORDER — SODIUM CHLORIDE 0.9 % IV SOLN: 500.0000 mL | INTRAVENOUS | Status: DC

## 2017-04-04 MED ORDER — OMEPRAZOLE 40 MG PO CPDR: DELAYED_RELEASE_CAPSULE | ORAL | 6 refills | Status: DC

## 2017-04-04 NOTE — Telephone Encounter (Signed)
Patty,  I left you a message on your voicemail but just wanted to make sure I didn't miss anything. This pt needs a colonoscopy at [facility] next Thursday per Dr. Ardis Hughs set up.  I did schedule her for a PV on Monday.  I told the pt that you would call her with the correct times.  Thank you. Cyril Mourning

## 2017-04-04 NOTE — Telephone Encounter (Signed)
Received: Today  Message Contents  [name], MD  Laverna Peace, RN        She can stay on both of those meds   Thanks   Previous Messages    ----- Message -----  From: Laverna Peace, RN  Sent: 04/04/2017 12:42 PM  To: [name], MD   Dr. Ardis Hughs,   Just wanted to make sure I had this clarify- is she to stop her baby ASA as well? Also, she takes Meloxicam 15 mg- 1/2 tablet daily and says she really needs to take that- stop that for now or is it ok?   Thanks       LMOM re: medication instructions

## 2017-04-04 NOTE — Telephone Encounter (Signed)
The pt has been notified of the colon at [facility] for 9/27 at 830 am and will keep pre visit for Monday

## 2017-04-04 NOTE — Progress Notes (Signed)
To recovery, report to RN, VSS. 

## 2017-04-04 NOTE — Op Note (Signed)
[facility] Patient Name: Kelsey Keller Procedure Date: 04/04/2017 11:25 AM MRN: [id number] Endoscopist: [name] , MD Age: 76 Referring MD:  Date of Birth: Apr 10, 1941 Gender: Female Account #: [id number] Procedure:                Upper GI endoscopy Indications:              Screening procedure for esophageal varices (recent                            diagnosis of cirrhosis), Epigastric abdominal pain Medicines:                Monitored Anesthesia Care Procedure:                Pre-Anesthesia Assessment:                           - Prior to the procedure, a History and Physical                            was performed, and patient medications and                            allergies were reviewed. The patient's tolerance of                            previous anesthesia was also reviewed. The risks                            and benefits of the procedure and the sedation                            options and risks were discussed with the patient.                            All questions were answered, and informed consent                            was obtained. Prior Anticoagulants: The patient has                            taken no previous anticoagulant or antiplatelet                            agents. ASA Grade Assessment: III - A patient with                            severe systemic disease. After reviewing the risks                            and benefits, the patient was deemed in                            satisfactory condition to undergo the procedure.  After obtaining informed consent, the endoscope was                            passed under direct vision. Throughout the                            procedure, the patient's blood pressure, pulse, and                            oxygen saturations were monitored continuously. The                            Model GIF-[id number]) scope was introduced     through the mouth, and advanced to the second part                            of duodenum. The upper GI endoscopy was                            accomplished without difficulty. The patient                            tolerated the procedure well. Scope In: Scope Out: Findings:                 Mild reflux esophagitis (LA Grade A) at site of                            very slight, focal peptic appearing stricture.                           Mild inflammation characterized by erythema and                            friability was found in the gastric antrum.                            Biopsies were taken with a cold forceps for                            histology.                           One deeply cratered duodenal 7-60mm ulcer with no                            stigmata of bleeding was found in the distal                            duodenal bulb. The surrounding duodenal mucosa was                            very edematous, not overtly neoplastic and it was  biopsied.                           No signs of portal hypertension. Complications:            No immediate complications. Estimated blood loss:                            None. Estimated Blood Loss:     Estimated blood loss: none. Impression:               - Reflux esophagitis.                           - Mild gastritis. Biopsied to check for H. pylori.                           - Deeply cratered distal duodenal bulb ulcer,                            surrounded by edematous mucosa (biopsied).                           - No signs of portal hypertension. Recommendation:           - Patient has a contact number available for                            emergencies. The signs and symptoms of potential                            delayed complications were discussed with the                            patient. Return to normal activities tomorrow.                            Written discharge instructions were  provided to the                            patient.                           - Resume previous diet.                           - Continue present medications.                           - Await pathology results.                           - Repeat labs today (CMET). Given + ANA (1:160                            titer) and +ASMA this is suspicious for autoimmune  hepatitis.                           - Given CEA elevation and liver lesion that is                            suspicious for "non-HCC" tumor per radiologist, I                            will proceed with colonoscopy next week. IR biopsy                            of the liver lesion on hold for now.                           - Stop zantac and she will start omeprazole 40mg  po                            AC BID, disp 60 with 6 refills (new script called                            in today).                           - No NSAIDs. [name], MD 04/04/2017 12:00:37 PM This report has been signed electronically.

## 2017-04-04 NOTE — Progress Notes (Signed)
PT to lab before D/C home  Message left and TE sent to [place] to set up a colonoscopy on 04-10-17 at [facility]  I set her up for a PV on Monday at 10:00 to get ready for this.

## 2017-04-04 NOTE — Patient Instructions (Signed)
YOU HAD AN ENDOSCOPIC PROCEDURE TODAY AT [facility]:   Refer to the procedure report that was given to you for any specific questions about what was found during the examination.  If the procedure report does not answer your questions, please call your gastroenterologist to clarify.  If you requested that your care partner not be given the details of your procedure findings, then the procedure report has been included in a sealed envelope for you to review at your convenience later.  YOU SHOULD EXPECT: Some feelings of bloating in the abdomen. Passage of more gas than usual.  Walking can help get rid of the air that was put into your GI tract during the procedure and reduce the bloating.  Please Note:  You might notice some irritation and congestion in your nose or some drainage.  This is from the oxygen used during your procedure.  There is no need for concern and it should clear up in a day or so.  SYMPTOMS TO REPORT IMMEDIATELY:  Following upper endoscopy (EGD)  Vomiting of blood or coffee ground material  New chest pain or pain under the shoulder blades  Painful or persistently difficult swallowing  New shortness of breath  Fever of 100F or higher  Black, tarry-looking stools  For urgent or emergent issues, a gastroenterologist can be reached at any hour by calling [phone].   DIET:  We do recommend a small meal at first, but then you may proceed to your regular diet.  Drink plenty of fluids but you should avoid alcoholic beverages for 24 hours.  ACTIVITY:  You should plan to take it easy for the rest of today and you should NOT DRIVE or use heavy machinery until tomorrow (because of the sedation medicines used during the test).    FOLLOW UP: Our staff will call the number listed on your records the next business day following your procedure to check on you and address any questions or concerns that you may have regarding the information given to you following your  procedure. If we do not reach you, we will leave a message.  However, if you are feeling well and you are not experiencing any problems, there is no need to return our call.  We will assume that you have returned to your regular daily activities without incident.  If any biopsies were taken you will be contacted by phone or by letter within the next 1-3 weeks.  Please call us at [phone] if you have not heard about the biopsies in 3 weeks.   SIGNATURES/CONFIDENTIALITY: You and/or your care partner have signed paperwork which will be entered into your electronic medical record.  These signatures attest to the fact that that the information above on your After Visit Summary has been reviewed and is understood.  Full responsibility of the confidentiality of this discharge information lies with you and/or your care-partner.  Please go to the lab before you leave  [facility]- take 1 pill 30 minutes before and dinner- stop Zantac No NSAIDS (Motrin, Advil, Aleve, Ibuprofen)  Dr. Ardis Hughs' nurse will set you up for a colonoscopy at [facility] on 04-10-17; you have a previsit scheduled for Monday at 10:00 a.m. To get you ready for this  Await pathology

## 2017-04-04 NOTE — Progress Notes (Signed)
Called to room to assist during endoscopic procedure.  Patient ID and intended procedure confirmed with present staff. Received instructions for my participation in the procedure from the performing physician.  

## 2017-04-04 NOTE — Progress Notes (Signed)
Pt's states no medical or surgical changes since previsit or office visit. 

## 2017-04-07 ENCOUNTER — Ambulatory Visit (AMBULATORY_SURGERY_CENTER): Payer: Self-pay | Admitting: *Deleted

## 2017-04-07 ENCOUNTER — Telehealth: Payer: Self-pay

## 2017-04-07 ENCOUNTER — Telehealth: Payer: Self-pay | Admitting: Gastroenterology

## 2017-04-07 VITALS — Ht 66.0 in | Wt 256.0 lb

## 2017-04-07 DIAGNOSIS — R978 Other abnormal tumor markers: Secondary | ICD-10-CM

## 2017-04-07 MED ORDER — NA SULFATE-K SULFATE-MG SULF 17.5-3.13-1.6 GM/177ML PO SOLN: 1.0000 [IU] | Freq: Once | ORAL | 0 refills | Status: AC

## 2017-04-07 NOTE — Telephone Encounter (Signed)
Left Suprep sample at 4th floor desk Called pt and she has agreed to come back and pick it up Angela/PV

## 2017-04-07 NOTE — Telephone Encounter (Signed)
Patient states prep was too expensive and would like to try something else. [facility] [address]

## 2017-04-07 NOTE — Telephone Encounter (Signed)
  Follow up Call-  Call Linkin Vizzini number 04/04/2017  Post procedure Call Iraida Cragin phone  # [phone]  Permission to leave phone message Yes  Some recent data might be hidden     Patient questions:  Do you have a fever, pain , or abdominal swelling? No. Pain Score  0 *  Have you tolerated food without any problems? Yes.    Have you been able to return to your normal activities? Yes.    Do you have any questions about your discharge instructions: Diet   No. Medications  No. Follow up visit  No.  Do you have questions or concerns about your Care? No.  Actions: * If pain score is 4 or above: No action needed, pain <4.

## 2017-04-07 NOTE — Progress Notes (Signed)
No egg or soy allergy known to patient  No issues with past sedation with any surgeries  or procedures, no intubation problems  No diet pills per patient No home 02 use per patient  No blood thinners per patient  Pt denies issues with constipation  No A fib or A flutter  EMMI video sent to pt's e mail  Pt. declined 

## 2017-04-08 ENCOUNTER — Other Ambulatory Visit: Payer: Medicare HMO

## 2017-04-08 ENCOUNTER — Encounter (HOSPITAL_COMMUNITY): Payer: Self-pay | Admitting: *Deleted

## 2017-04-10 ENCOUNTER — Encounter (HOSPITAL_COMMUNITY): Payer: Self-pay | Admitting: *Deleted

## 2017-04-10 ENCOUNTER — Ambulatory Visit (HOSPITAL_COMMUNITY): Payer: Medicare HMO | Admitting: Anesthesiology

## 2017-04-10 ENCOUNTER — Encounter (HOSPITAL_COMMUNITY)
Admission: RE | Disposition: A | Discharge status: Home / Self Care | Payer: Self-pay | Source: Ambulatory Visit | Attending: Gastroenterology

## 2017-04-10 ENCOUNTER — Ambulatory Visit (HOSPITAL_COMMUNITY)
Admission: RE | Admit: 2017-04-10 | Discharge: 2017-04-10 | Disposition: A | Discharge status: Home / Self Care | Payer: Medicare HMO | Source: Ambulatory Visit | Attending: Gastroenterology | Admitting: Gastroenterology

## 2017-04-10 DIAGNOSIS — I868 Varicose veins of other specified sites: Secondary | ICD-10-CM | POA: Insufficient documentation

## 2017-04-10 DIAGNOSIS — Z79899 Other long term (current) drug therapy: Secondary | ICD-10-CM | POA: Insufficient documentation

## 2017-04-10 DIAGNOSIS — E039 Hypothyroidism, unspecified: Secondary | ICD-10-CM | POA: Diagnosis not present

## 2017-04-10 DIAGNOSIS — M48061 Spinal stenosis, lumbar region without neurogenic claudication: Secondary | ICD-10-CM | POA: Diagnosis not present

## 2017-04-10 DIAGNOSIS — Z7982 Long term (current) use of aspirin: Secondary | ICD-10-CM | POA: Diagnosis not present

## 2017-04-10 DIAGNOSIS — E785 Hyperlipidemia, unspecified: Secondary | ICD-10-CM | POA: Diagnosis not present

## 2017-04-10 DIAGNOSIS — K766 Portal hypertension: Secondary | ICD-10-CM | POA: Diagnosis not present

## 2017-04-10 DIAGNOSIS — M1712 Unilateral primary osteoarthritis, left knee: Secondary | ICD-10-CM | POA: Insufficient documentation

## 2017-04-10 DIAGNOSIS — Z6841 Body Mass Index (BMI) 40.0 and over, adult: Secondary | ICD-10-CM | POA: Insufficient documentation

## 2017-04-10 DIAGNOSIS — K7460 Unspecified cirrhosis of liver: Secondary | ICD-10-CM | POA: Diagnosis not present

## 2017-04-10 DIAGNOSIS — I10 Essential (primary) hypertension: Secondary | ICD-10-CM | POA: Insufficient documentation

## 2017-04-10 DIAGNOSIS — Z1212 Encounter for screening for malignant neoplasm of rectum: Secondary | ICD-10-CM | POA: Diagnosis not present

## 2017-04-10 DIAGNOSIS — Z7984 Long term (current) use of oral hypoglycemic drugs: Secondary | ICD-10-CM | POA: Diagnosis not present

## 2017-04-10 DIAGNOSIS — K760 Fatty (change of) liver, not elsewhere classified: Secondary | ICD-10-CM | POA: Diagnosis not present

## 2017-04-10 DIAGNOSIS — R197 Diarrhea, unspecified: Secondary | ICD-10-CM | POA: Diagnosis not present

## 2017-04-10 DIAGNOSIS — R160 Hepatomegaly, not elsewhere classified: Secondary | ICD-10-CM | POA: Insufficient documentation

## 2017-04-10 DIAGNOSIS — E559 Vitamin D deficiency, unspecified: Secondary | ICD-10-CM | POA: Diagnosis not present

## 2017-04-10 DIAGNOSIS — R5383 Other fatigue: Secondary | ICD-10-CM | POA: Insufficient documentation

## 2017-04-10 DIAGNOSIS — R970 Elevated carcinoembryonic antigen [CEA]: Secondary | ICD-10-CM | POA: Diagnosis not present

## 2017-04-10 DIAGNOSIS — E119 Type 2 diabetes mellitus without complications: Secondary | ICD-10-CM | POA: Insufficient documentation

## 2017-04-10 DIAGNOSIS — Z1211 Encounter for screening for malignant neoplasm of colon: Secondary | ICD-10-CM | POA: Insufficient documentation

## 2017-04-10 DIAGNOSIS — K7689 Other specified diseases of liver: Secondary | ICD-10-CM

## 2017-04-10 DIAGNOSIS — M810 Age-related osteoporosis without current pathological fracture: Secondary | ICD-10-CM | POA: Insufficient documentation

## 2017-04-10 DIAGNOSIS — K769 Liver disease, unspecified: Secondary | ICD-10-CM

## 2017-04-10 HISTORY — PX: COLONOSCOPY WITH PROPOFOL: SHX5780

## 2017-04-10 HISTORY — DX: Abnormal levels of other serum enzymes: R74.8

## 2017-04-10 LAB — GLUCOSE, CAPILLARY: Glucose-Capillary: 130 mg/dL — ABNORMAL HIGH (ref 65–99)

## 2017-04-10 SURGERY — COLONOSCOPY WITH PROPOFOL
Anesthesia: Monitor Anesthesia Care

## 2017-04-10 MED ORDER — SODIUM CHLORIDE 0.9 % IV SOLN: INTRAVENOUS | Status: DC

## 2017-04-10 MED ORDER — PROPOFOL 10 MG/ML IV BOLUS
INTRAVENOUS | Status: DC | PRN
  Administered 2017-04-10: 30 mg via INTRAVENOUS
  Administered 2017-04-10: 20 mg via INTRAVENOUS

## 2017-04-10 MED ORDER — LIDOCAINE 2% (20 MG/ML) 5 ML SYRINGE
INTRAMUSCULAR | Status: DC | PRN
  Administered 2017-04-10: 100 mg via INTRAVENOUS

## 2017-04-10 MED ORDER — PROPOFOL 500 MG/50ML IV EMUL
INTRAVENOUS | Status: DC | PRN
  Administered 2017-04-10: 100 ug/kg/min via INTRAVENOUS

## 2017-04-10 MED ORDER — PROPOFOL 10 MG/ML IV BOLUS
INTRAVENOUS | Status: AC
  Filled 2017-04-10: qty 40

## 2017-04-10 MED ORDER — LACTATED RINGERS IV SOLN
INTRAVENOUS | Status: DC
  Administered 2017-04-10: 08:00:00 via INTRAVENOUS

## 2017-04-10 MED ORDER — LIDOCAINE 2% (20 MG/ML) 5 ML SYRINGE
INTRAMUSCULAR | Status: AC
  Filled 2017-04-10: qty 5

## 2017-04-10 SURGICAL SUPPLY — 22 items

## 2017-04-10 NOTE — Transfer of Care (Signed)
Immediate Anesthesia Transfer of Care Note  Patient: Kelsey Keller  Procedure(s) Performed: Procedure(s): COLONOSCOPY WITH PROPOFOL (N/A)  Patient Location: Endoscopy Unit  Anesthesia Type:MAC  Level of Consciousness: awake  Airway & Oxygen Therapy: Patient Spontanous Breathing  Post-op Assessment: Report given to RN and Post -op Vital signs reviewed and stable  Post vital signs: Reviewed and stable  Last Vitals:  Vitals:   04/10/17 0745  BP: (!) 166/108  Pulse: 66  Resp: 20  Temp: 36.7 C  SpO2: 95%    Last Pain:  Vitals:   04/10/17 0745  TempSrc: Oral  PainSc: 5          Complications: No apparent anesthesia complications

## 2017-04-10 NOTE — H&P (View-Only) (Signed)
Subjective:    Patient ID: Kelsey Keller, female    DOB: August 14, 1940, 76 y.o.   MRN: [id number]  HPI Kelsey Keller is a pleasant 76 year old white female, new to GI today referred by Dr. Cathlean Cower for abnormal LFTs and CT imaging. Patient has history of hypertension, adult-onset diabetes mellitus and spinal stenosis. She reports having colonoscopy done in 2007, somewhere in [place] though she does not recall where. She was told that this was negative with no polyps. She also completed a Cologuard last year which was negative.  Patient has undergone recent evaluation after she became ill in August while she was vacationing at [employer]. She said she was feeling fine prior to leaving for vacation and had not had any recent GI symptoms. She says she developed of fatigue mild diarrhea, and then stayed in bed for about 48 hours feeling very poorly with mild upper abdominal discomfort. She did not have any documented fever but did have sweats, diarrhea quickly subsided and she had no nausea or vomiting. She came to see Dr. Jenny Reichmann a couple of days later and says ever since then she has not had any energy and is felt completely fatigued. She has had mild epigastric discomfort that comes and goes. She has been using Zantac which she finds somewhat helpful. She says perhaps a couple of times per week she'll have a more significant epigastric discomfort that she takes tramadol for has not had any radiation of pain into her chest or into her back no further fever or sweats. She has not been told of any liver disease in the past there was told about 20 years ago that she had a fatty liver. Is no family history of liver disease that she is aware of, she is a nondrinker and no history of hepatitis. Fairly extensive workup to date with labs on 03/04/2017 total bili of since 7.1 direct bili 4.8 AST of 84 ALT of 102 WBC 8.1 hemoglobin 12.7 platelets 142 BUN 30 and creatinine 1.24 Labs repeated a 20 7T bili 3.2 alkaline  phosphatase 264 AST 93 and ALT of 82. Labs repeated per Dr. Donne Hazel last week with T bili of 1.4 direct bili 0.6, alkaline phosphatase 114 AST 32, ALT 25, albumin 3.5. CT abdomen and pelvis done on 03/12/2017 showed a nodular liver consistent with cirrhosis, there is a 9 mm lesion at the right hepatic dome too small to characterize, no biliary dilation, she did have a thick-walled gallbladder with multiple gallstones, no splenomegaly but noted splenic varices. Patient was referred to Dr. Donne Hazel who saw her and ordered MRI of the abdomen , which shows irregular hepatic contour, small layering gallstones normal caliber common bile duct, no gallbladder wall thickening or pericholecystic fluid, no intrahepatic biliary dilation. Spleen normal. There is a peripherally enhancing T2 hyperintense and T1 hypointense lesion within segment 8 of the liver, measuring 1.8 cm, and indeterminant. Presence of arterial phase rim/peripheral is a worrisome feature for non-HCC malignancy, consider biopsy.  Review of Systems Pertinent positive and negative review of systems were noted in the above HPI section.  All other review of systems was otherwise negative.  Outpatient Encounter Prescriptions as of 04/01/2017  Medication Sig  . ACCU-CHEK SOFTCLIX LANCETS lancets 1 each by Other route 2 (two) times daily. Use to help check blood sugars twice a day Dx e11.9  . alendronate (FOSAMAX) 70 MG tablet TAKE 1 TABLET ONCE A WEEK. TAKE WITH A FULL GLASS OF WATER ON AN EMPTY STOMACH.  Marland Kitchen aspirin  81 MG EC tablet Take 81 mg by mouth daily.    Marland Kitchen atenolol-chlorthalidone (TENORETIC) 50-25 MG tablet Take 1 tablet by mouth daily.  . Blood Glucose Calibration (ACCU-CHEK COMPACT PLUS CONTROL) SOLN Use as directed Dx E11.9  . Blood Glucose Monitoring Suppl (ACCU-CHEK AVIVA PLUS) w/Device KIT Use as directed to check blood sugars everyday Dx e11.9  . calcium carbonate (TUMS EX) 750 MG chewable tablet Chew 1 tablet by mouth 2 (two) times  daily.    . cetirizine (ZYRTEC) 10 MG tablet Take 1 tablet (10 mg total) by mouth daily.  . Cholecalciferol (VITAMIN D3) 2000 units capsule Take 1 capsule (2,000 Units total) by mouth daily.  Marland Kitchen co-enzyme Q-10 30 MG capsule Take 100 mg by mouth daily.  . fluticasone (FLONASE) 50 MCG/ACT nasal spray Place 2 sprays into both nostrils daily.  Marland Kitchen gabapentin (NEURONTIN) 300 MG capsule 1 pill in AM, 1 pill PM and 2 pills at night (Patient taking differently: 2 pill in AM, 2 pill PM and 2 pills at night)  . glimepiride (AMARYL) 4 MG tablet Take 1 tablet (4 mg total) by mouth 2 (two) times daily.  Marland Kitchen glucose blood test strip Use as directed two times a day, diagnosis code E11.9  . HYDROcodone-acetaminophen (NORCO/VICODIN) 5-325 MG tablet Take 1 tablet by mouth every 6 (six) hours as needed for moderate pain.  Marland Kitchen levothyroxine (SYNTHROID, LEVOTHROID) 175 MCG tablet Take 1 tablet (175 mcg total) by mouth daily before breakfast.  . meloxicam (MOBIC) 15 MG tablet Take 1 tablet (15 mg total) by mouth daily.  . metFORMIN (GLUCOPHAGE) 1000 MG tablet Take 1 tablet (1,000 mg total) by mouth 2 (two) times daily with a meal.  . pioglitazone (ACTOS) 15 MG tablet Take 1 tablet (15 mg total) by mouth daily.  . ranitidine (ZANTAC) 75 MG tablet Take 75 mg by mouth as needed for heartburn.  . Red Yeast Rice 600 MG CAPS Take 600 mg by mouth 2 (two) times daily.  . traMADol (ULTRAM) 50 MG tablet Take 1 tablet (50 mg total) by mouth every 6 (six) hours as needed.  . triamcinolone (NASACORT) 55 MCG/ACT AERO nasal inhaler Place 2 sprays into the nose daily.  . potassium chloride (K-DUR) 10 MEQ tablet Take 1 tablet (10 mEq total) by mouth daily.  . ranitidine (ZANTAC) 150 MG tablet Take 1 tablet (150 mg total) by mouth 2 (two) times daily.   No facility-administered encounter medications on file as of 04/01/2017.    No Known Allergies Patient Active Problem List   Diagnosis Date Noted  . Generalized abdominal pain 03/04/2017    . Hypercalcemia 01/17/2016  . Vitamin D deficiency 01/17/2016  . Right ankle pain 01/17/2016  . Loosening of hardware in spine ([place]) 01/02/2016  . Lumbar disc disease 01/17/2015  . Urinary incontinence in female 01/17/2015  . Cough 07/26/2014  . Skin lesion 01/21/2014  . Obesity 09/18/2011  . Preoperative examination, unspecified 10/15/2010  . Encounter for well adult exam with abnormal findings 10/15/2010  . Diabetes ([place]) 06/23/2007  . OSTEOARTHRITIS, KNEE, LEFT 06/23/2007  . OSTEOPOROSIS NOS 05/12/2007  . Hypothyroidism 04/29/2007  . Hyperlipidemia 04/29/2007  . Essential hypertension 04/29/2007  . Allergic rhinitis 04/29/2007  . SPINAL STENOSIS, LUMBAR 04/29/2007  . LOW BACK PAIN 04/29/2007  . HIATAL HERNIA, HX OF 04/29/2007   Social History   Social History  . Marital status: Divorced    Spouse name: N/A  . Number of children: N/A  . Years of education: N/A  Occupational History  . 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 Smoking status: Never Smoker  . Smokeless tobacco: Never Used  . Alcohol use 0.0 oz/week     Comment: One or two drinks in month  . Drug use: No  . Sexual activity: Not on file   Other Topics Concern  . Not on file   Social History Narrative  . No narrative on file    Kelsey Keller's family history includes Arthritis in her brother and mother; COPD in her brother; Coronary artery disease in her father and other; Diabetes in her other; Hyperlipidemia in her brother; Hypertension in her mother; Stroke in her brother.      Objective:    Vitals:   04/01/17 0949  BP: (!) 142/84  Pulse: 78  SpO2: 96%    Physical Exam well-developed older white female in no acute distress, accompanied by her daughter, both pleasant blood pressure 142/84 pulse 78, height 5 foot 6, weight 255, BMI 41.1. HEENT; nontraumatic normocephalic EOMI PERRLA sclera slightly icteric, Cardiovascular; regular rate and rhythm with S1-S2 no murmur or gallop,  Pulmonary ;clear bilaterally, Abdomen, soft has some mild tenderness in the epigastrium there is no guarding or rebound no palpable mass or hepatosplenomegaly bowel sounds are present, no fluid wave, Rectal; exam not done, Extremities; no clubbing cyanosis or edema skin warm and dry, Neuropsych; mood and affect appropriate       Assessment & Plan:   #64 76 year old white female with onset of illness about one month ago with fatigue, epigastric discomfort sweats and possible fever. Since then she has had ongoing significant fatigue and intermittent episodes of epigastric pain, and rather constant epigastric discomfort. Acute illness associated with elevated LFTs and hyperbilirubinemia which have since normalized. Initial CT concerning for cholecystitis with gallbladder wall thickening and multiple stones. 2 weeks later MRI shows no evidence of gallbladder wall thickening though symptoms are certainly consistent with gallbladder disease.  #2 new diagnosis of cirrhosis, etiology not clear though suspect Kelsey Keller. She had abdominal ultrasound done in 2001 showing a fatty liver, and review of labs over the past few years show that she has had some mild transaminase elevation.  #3 right hepatic lobe indeterminate lesion, measuring 1.8 cm and concerning for non-HCC malignancy on MRI. #4 evidence for portal hypertension with splenic varices on CT #5 adult-onset diabetes mellitus, #6 hypertension #7 obesity  Plan; Complicated situation discussed with agent and her daughter and recent findings on imaging and labs explained. She will need cholecystectomy per Dr. Donne Hazel, but it will work up further from hepatic standpoint. She's not had any evidence of severe hepatic decompensation to date, Will check hepatitis serologies, and genetic/autoimmune markers to rule out other forms of chronic liver disease. Recent INR normal at 1.1 Check AFP and CEA We will contact IR, and asked them to review CT and MRI for  feasibility of biopsy of the right hepatic dome lesion. Patient has also been scheduled for EGD with Dr. Ardis Hughs, for variceal screening. She will need repeat labs done within the next 2 weeks and will plan quick office follow-up as well. For now she will take Zantac 150 twice a day, follow low-fat bland diet, and it was advised to seek ER care should she have any unrelenting episode of upper abdominal pain, nausea vomiting or associated fever.    Elanda Garmany Genia Harold PA-C 04/01/2017   Cc: Biagio Borg, MD

## 2017-04-10 NOTE — Anesthesia Preprocedure Evaluation (Signed)
Anesthesia Evaluation  Patient identified by MRN, date of birth, ID band Patient awake    Reviewed: Allergy & Precautions, NPO status , Patient's Chart, lab work & pertinent test results  Airway Mallampati: II  TM Distance: >3 FB Neck ROM: Full    Dental no notable dental hx.    Pulmonary neg pulmonary ROS,    Pulmonary exam normal breath sounds clear to auscultation       Cardiovascular hypertension, Normal cardiovascular exam Rhythm:Regular Rate:Normal     Neuro/Psych negative neurological ROS  negative psych ROS   GI/Hepatic negative GI ROS, Neg liver ROS,   Endo/Other  diabetesHypothyroidism Morbid obesity  Renal/GU negative Renal ROS  negative genitourinary   Musculoskeletal negative musculoskeletal ROS (+)   Abdominal   Peds negative pediatric ROS (+)  Hematology negative hematology ROS (+)   Anesthesia Other Findings   Reproductive/Obstetrics negative OB ROS                             Anesthesia Physical Anesthesia Plan  ASA: II  Anesthesia Plan: MAC   Post-op Pain Management:    Induction: Intravenous  PONV Risk Score and Plan: 0  Airway Management Planned: Simple Face Mask  Additional Equipment:   Intra-op Plan:   Post-operative Plan:   Informed Consent: I have reviewed the patients History and Physical, chart, labs and discussed the procedure including the risks, benefits and alternatives for the proposed anesthesia with the patient or authorized representative who has indicated his/her understanding and acceptance.   Dental advisory given  Plan Discussed with: CRNA and Surgeon  Anesthesia Plan Comments:         Anesthesia Quick Evaluation

## 2017-04-10 NOTE — Discharge Instructions (Signed)
YOU HAD AN ENDOSCOPIC PROCEDURE TODAY: Refer to the procedure report and other information in the discharge instructions given to you for any specific questions about what was found during the examination. If this information does not answer your questions, please call [place] office at [phone] to clarify.   YOU SHOULD EXPECT: Some feelings of bloating in the abdomen. Passage of more gas than usual. Walking can help get rid of the air that was put into your GI tract during the procedure and reduce the bloating. If you had a lower endoscopy (such as a colonoscopy or flexible sigmoidoscopy) you may notice spotting of blood in your stool or on the toilet paper. Some abdominal soreness may be present for a day or two, also.  DIET: Your first meal following the procedure should be a light meal and then it is ok to progress to your normal diet. A half-sandwich or bowl of soup is an example of a good first meal. Heavy or fried foods are harder to digest and may make you feel nauseous or bloated. Drink plenty of fluids but you should avoid alcoholic beverages for 24 hours. If you had a esophageal dilation, please see attached instructions for diet.    ACTIVITY: Your care partner should take you home directly after the procedure. You should plan to take it easy, moving slowly for the rest of the day. You can resume normal activity the day after the procedure however YOU SHOULD NOT DRIVE, use power tools, machinery or perform tasks that involve climbing or major physical exertion for 24 hours (because of the sedation medicines used during the test).   SYMPTOMS TO REPORT IMMEDIATELY: A gastroenterologist can be reached at any hour. Please call [phone]  for any of the following symptoms:  Following lower endoscopy (colonoscopy, flexible sigmoidoscopy) Excessive amounts of blood in the stool  Significant tenderness, worsening of abdominal pains  Swelling of the abdomen that is new, acute  Fever of 100 or  higher  Following upper endoscopy (EGD, EUS, ERCP, esophageal dilation) Vomiting of blood or coffee ground material  New, significant abdominal pain  New, significant chest pain or pain under the shoulder blades  Painful or persistently difficult swallowing  New shortness of breath  Black, tarry-looking or red, bloody stools  FOLLOW UP:  If any biopsies were taken you will be contacted by phone or by letter within the next 1-3 weeks. Call [phone]  if you have not heard about the biopsies in 3 weeks.  Please also call with any specific questions about appointments or follow up tests.YOU HAD AN ENDOSCOPIC PROCEDURE TODAY: Refer to the procedure report and other information in the discharge instructions given to you for any specific questions about what was found during the examination. If this information does not answer your questions, please call [place] office at [phone] to clarify.   YOU SHOULD EXPECT: Some feelings of bloating in the abdomen. Passage of more gas than usual. Walking can help get rid of the air that was put into your GI tract during the procedure and reduce the bloating. If you had a lower endoscopy (such as a colonoscopy or flexible sigmoidoscopy) you may notice spotting of blood in your stool or on the toilet paper. Some abdominal soreness may be present for a day or two, also.  DIET: Your first meal following the procedure should be a light meal and then it is ok to progress to your normal diet. A half-sandwich or bowl of soup is an example of a   good first meal. Heavy or fried foods are harder to digest and may make you feel nauseous or bloated. Drink plenty of fluids but you should avoid alcoholic beverages for 24 hours. If you had a esophageal dilation, please see attached instructions for diet.    ACTIVITY: Your care partner should take you home directly after the procedure. You should plan to take it easy, moving slowly for the rest of the day. You can resume  normal activity the day after the procedure however YOU SHOULD NOT DRIVE, use power tools, machinery or perform tasks that involve climbing or major physical exertion for 24 hours (because of the sedation medicines used during the test).   SYMPTOMS TO REPORT IMMEDIATELY: A gastroenterologist can be reached at any hour. Please call [phone]  for any of the following symptoms:  Following lower endoscopy (colonoscopy, flexible sigmoidoscopy) Excessive amounts of blood in the stool  Significant tenderness, worsening of abdominal pains  Swelling of the abdomen that is new, acute  Fever of 100 or higher  Following upper endoscopy (EGD, EUS, ERCP, esophageal dilation) Vomiting of blood or coffee ground material  New, significant abdominal pain  New, significant chest pain or pain under the shoulder blades  Painful or persistently difficult swallowing  New shortness of breath  Black, tarry-looking or red, bloody stools  FOLLOW UP:  If any biopsies were taken you will be contacted by phone or by letter within the next 1-3 weeks. Call [phone]  if you have not heard about the biopsies in 3 weeks.  Please also call with any specific questions about appointments or follow up tests. 

## 2017-04-10 NOTE — Op Note (Signed)
[facility] Patient Name: Kelsey Keller Procedure Date: 04/10/2017 MRN: [id number] Attending MD: [name] , MD Date of Birth: 1941/02/06 CSN: [id number] Age: 76 Admit Type: Outpatient Procedure:                Colonoscopy Indications:              Screening for colorectal malignant neoplasm; newly                            diagnosed cirrhois, + lesion in liver, slightly                            elevated CEA, improving LFTs Providers:                [name], MD, Cleda Daub, RN, Cletis Athens, Technician, [facility], CRNA Referring MD:              Medicines:                Monitored Anesthesia Care Complications:            No immediate complications. Estimated blood loss:                            None. Estimated Blood Loss:     Estimated blood loss: none. Procedure:                Pre-Anesthesia Assessment:                           - Prior to the procedure, a History and Physical                            was performed, and patient medications and                            allergies were reviewed. The patient's tolerance of                            previous anesthesia was also reviewed. The risks                            and benefits of the procedure and the sedation                            options and risks were discussed with the patient.                            All questions were answered, and informed consent                            was obtained. Prior Anticoagulants: The patient has  taken no previous anticoagulant or antiplatelet                            agents. ASA Grade Assessment: III - A patient with                            severe systemic disease. After reviewing the risks                            and benefits, the patient was deemed in                            satisfactory condition to undergo the procedure.                           After  obtaining informed consent, the colonoscope                            was passed under direct vision. Throughout the                            procedure, the patient's blood pressure, pulse, and                            oxygen saturations were monitored continuously. The                            EC-[id number] ([id number]) scope was introduced through                            the anus and advanced to the the terminal ileum.                            The colonoscopy was performed without difficulty.                            The patient tolerated the procedure well. The                            quality of the bowel preparation was excellent. The                            terminal ileum, ileocecal valve, appendiceal                            orifice, and rectum were photographed. Scope In: 8:41:34 AM Scope Out: 3:01:60 AM Scope Withdrawal Time: 0 hours 11 minutes 9 seconds  Total Procedure Duration: 0 hours 16 minutes 12 seconds  Findings:      The terminal ileum appeared normal.      The entire examined colon appeared normal on direct and retroflexion       views. Impression:               - The examined portion of the ileum was normal.                           -  The entire examined colon is normal on direct and                            retroflexion views.                           - No polyps or cancers. Moderate Sedation:      N/A- Per Anesthesia Care Recommendation:           - Patient has a contact number available for                            emergencies. The signs and symptoms of potential                            delayed complications were discussed with the                            patient. Return to normal activities tomorrow.                            Written discharge instructions were provided to the                            patient.                           - Resume previous diet.                           - Continue present medications.                            - Dr. Ardis Hughs' office will arrange percutaneous                            biopsy of the liver mass (check for cancer) and                            same time random liver biopsy (establish etiology                            of cirrhosis).                           - Given signficant improvement in your liver tests                            as well as signficant improvement in your pain                            since starting PPI twice daily, will continue to                            hold on plans for gallbladder removal for now. Procedure Code(s):        ---  Professional ---                           [phone], Colonoscopy, flexible; diagnostic, including                            collection of specimen(s) by brushing or washing,                            when performed (separate procedure) Diagnosis Code(s):        --- Professional ---                           Z12.11, Encounter for screening for malignant                            neoplasm of colon CPT copyright 2016 [employer]. All rights reserved. The codes documented in this report are preliminary and upon coder review may  be revised to meet current compliance requirements. [name], MD 04/10/2017 9:04:42 AM This report has been signed electronically. Number of Addenda: 0

## 2017-04-10 NOTE — Interval H&P Note (Signed)
History and Physical Interval Note:  04/10/2017 8:30 AM  Kelsey Keller  has presented today for surgery, with the diagnosis of CEA elevation, liver lesion  The various methods of treatment have been discussed with the patient and family. After consideration of risks, benefits and other options for treatment, the patient has consented to  Procedure(s): COLONOSCOPY WITH PROPOFOL (N/A) as a surgical intervention .  The patient's history has been reviewed, patient examined, no change in status, stable for surgery.  I have reviewed the patient's chart and labs.  Questions were answered to the patient's satisfaction.     [name]

## 2017-04-10 NOTE — Anesthesia Postprocedure Evaluation (Signed)
Anesthesia Post Note  Patient: Kelsey Keller  Procedure(s) Performed: Procedure(s) (LRB): COLONOSCOPY WITH PROPOFOL (N/A)     Patient location during evaluation: PACU Anesthesia Type: MAC Level of consciousness: awake and alert Pain management: pain level controlled Vital Signs Assessment: post-procedure vital signs reviewed and stable Respiratory status: spontaneous breathing, nonlabored ventilation, respiratory function stable and patient connected to nasal cannula oxygen Cardiovascular status: stable and blood pressure returned to baseline Postop Assessment: no apparent nausea or vomiting Anesthetic complications: no    Last Vitals:  Vitals:   04/10/17 0745  BP: (!) 166/108  Pulse: 66  Resp: 20  Temp: 36.7 C  SpO2: 95%    Last Pain:  Vitals:   04/10/17 0745  TempSrc: Oral  PainSc: 5                  Nylene Inlow S

## 2017-04-11 ENCOUNTER — Encounter (HOSPITAL_COMMUNITY): Payer: Self-pay | Admitting: Gastroenterology

## 2017-04-14 ENCOUNTER — Other Ambulatory Visit: Payer: Self-pay

## 2017-04-14 ENCOUNTER — Telehealth: Payer: Self-pay

## 2017-04-14 DIAGNOSIS — R160 Hepatomegaly, not elsewhere classified: Secondary | ICD-10-CM

## 2017-04-14 DIAGNOSIS — K7030 Alcoholic cirrhosis of liver without ascites: Secondary | ICD-10-CM

## 2017-04-14 NOTE — Telephone Encounter (Signed)
-----   Message from Alfredia Ferguson, PA-C sent at 04/11/2017 12:59 PM EDT ----- Regarding: Liver BX Beth - please schedule this pt for US guided liver biopsy and US guided biopsy of the liver mass ----- Message ----- From: [name], MD Sent: 04/10/2017   9:05 AM To: Alfredia Ferguson, PA-C, Rolm Bookbinder, MD  Amy, Can you go ahead with IR percutaneous liver biopsy of the liver mass (check for malignancy) and same time random liver biopsy (establish cause of cirrhosis if possible).  She's expecting the office to call in next 1-2 days with information.  Thanks  Matt, Complicated case here.  For now, would hold on gallbladder surgery.  I found a pretty deep ulcer in distal duodenal bulb that probably caused her pains and MAY have caused transient LFT rise given it's proximity to the bile duct.  LFTs much better over past couple weeks. Her pain is much better after starting PPI twice daily last week.  Planning to follow her liver tests and get a biopsy of liver mass and normal liver.  If her body proves the gallbladder is a serious problem I'll let you know.   Thanks DJ

## 2017-04-14 NOTE — Telephone Encounter (Signed)
Orders entered. Left message with Interventional Radiology.

## 2017-04-14 NOTE — Telephone Encounter (Signed)
Left message at IR

## 2017-04-15 ENCOUNTER — Telehealth: Payer: Self-pay

## 2017-04-15 DIAGNOSIS — R160 Hepatomegaly, not elsewhere classified: Secondary | ICD-10-CM

## 2017-04-15 NOTE — Telephone Encounter (Signed)
-----   Message from [name], MD sent at 04/15/2017  3:16 PM EDT ----- Regarding: RE: US biopsy Dareld Mcauliffe, see below.  Elmo Putt, I am completely clueless about putting in these orders.  I'll have someone look into it though.   ----- Message ----- From: Alecia Lemming Sent: 75/10/4918   3:14 PM To: [name], MD Subject: RE: US biopsy                                  I just need US biopsy put in on patient I will call patient once I hear back from radiologist its in for review ----- Message ----- From: [name], MD Sent: 04/15/2017   3:04 PM To: Richardson Dopp, RN Subject: RE: US biopsy                                  Amy was working on this.   Mertha Clyatt, can you contact Ms. Bethea at IR about liver biopsy. I want to make sure that she gets a biopsy of the liver mass (to check for cancer) and also a biopsy randomly from the liver (to workup her cirrhosis).  Elmo Putt, I'll have Reuven Braver from my office contact you. What is the best way to get you directly.  I want to make sure this is done correctly. Thanks    ----- Message ----- From: Alecia Lemming Sent: 10/0/7121   2:59 PM To: [name], MD Subject: US biopsy                                      Do you want a biopsy on patient if so order need to be change to US biopsy.Thanks

## 2017-04-16 ENCOUNTER — Other Ambulatory Visit: Payer: Self-pay

## 2017-04-16 DIAGNOSIS — R160 Hepatomegaly, not elsewhere classified: Secondary | ICD-10-CM

## 2017-04-16 NOTE — Telephone Encounter (Signed)
Patient has been contacted and scheduled for her biopsy.

## 2017-04-16 NOTE — Telephone Encounter (Signed)
Great thanks - you have in the orders that she needs bx of the mass and    Regular liver bx correct ?

## 2017-04-16 NOTE — Telephone Encounter (Signed)
Yes. Confirmed. My original 2 orders had been removed. A new order was put in by Cira Rue, RN for liver biopsy because she had also been told to schedule. I spoke with Ultrasound tech. Per her instruction, I have added back the order for the biopsy for a mass. It does need to be 2 separate orders. Thank you for noticing this.

## 2017-04-17 ENCOUNTER — Other Ambulatory Visit: Payer: Self-pay | Admitting: Radiology

## 2017-04-17 NOTE — Telephone Encounter (Signed)
Great, thanks

## 2017-04-18 ENCOUNTER — Other Ambulatory Visit: Payer: Self-pay | Admitting: Radiology

## 2017-04-21 ENCOUNTER — Ambulatory Visit (HOSPITAL_COMMUNITY)
Admission: RE | Admit: 2017-04-21 | Discharge: 2017-04-21 | Disposition: A | Discharge status: Home / Self Care | Payer: Medicare HMO | Source: Ambulatory Visit | Attending: Interventional Radiology | Admitting: Interventional Radiology

## 2017-04-21 ENCOUNTER — Encounter (HOSPITAL_COMMUNITY): Payer: Self-pay

## 2017-04-21 ENCOUNTER — Ambulatory Visit (HOSPITAL_COMMUNITY)
Admission: RE | Admit: 2017-04-21 | Discharge: 2017-04-21 | Disposition: A | Discharge status: Home / Self Care | Payer: Medicare HMO | Source: Ambulatory Visit | Attending: Gastroenterology | Admitting: Gastroenterology

## 2017-04-21 ENCOUNTER — Other Ambulatory Visit: Payer: Self-pay | Admitting: Gastroenterology

## 2017-04-21 DIAGNOSIS — R160 Hepatomegaly, not elsewhere classified: Secondary | ICD-10-CM

## 2017-04-21 DIAGNOSIS — Z01818 Encounter for other preprocedural examination: Secondary | ICD-10-CM | POA: Diagnosis present

## 2017-04-21 DIAGNOSIS — K7689 Other specified diseases of liver: Secondary | ICD-10-CM | POA: Diagnosis not present

## 2017-04-21 DIAGNOSIS — K831 Obstruction of bile duct: Secondary | ICD-10-CM | POA: Insufficient documentation

## 2017-04-21 DIAGNOSIS — K7460 Unspecified cirrhosis of liver: Secondary | ICD-10-CM | POA: Insufficient documentation

## 2017-04-21 LAB — COMPREHENSIVE METABOLIC PANEL
ALT: 27 U/L (ref 14–54)
ANION GAP: 9 (ref 5–15)
AST: 40 U/L (ref 15–41)
Albumin: 3.8 g/dL (ref 3.5–5.0)
Alkaline Phosphatase: 74 U/L (ref 38–126)
BUN: 16 mg/dL (ref 6–20)
CALCIUM: 10 mg/dL (ref 8.9–10.3)
CHLORIDE: 102 mmol/L (ref 101–111)
CO2: 28 mmol/L (ref 22–32)
Creatinine, Ser: 0.88 mg/dL (ref 0.44–1.00)
Glucose, Bld: 164 mg/dL — ABNORMAL HIGH (ref 65–99)
Potassium: 3.6 mmol/L (ref 3.5–5.1)
SODIUM: 139 mmol/L (ref 135–145)
Total Bilirubin: 1 mg/dL (ref 0.3–1.2)
Total Protein: 6.7 g/dL (ref 6.5–8.1)

## 2017-04-21 LAB — GLUCOSE, CAPILLARY: GLUCOSE-CAPILLARY: 157 mg/dL — AB (ref 65–99)

## 2017-04-21 LAB — CBC WITH DIFFERENTIAL/PLATELET
Basophils Absolute: 0 10*3/uL (ref 0.0–0.1)
Basophils Relative: 0 %
EOS ABS: 0.3 10*3/uL (ref 0.0–0.7)
EOS PCT: 7 %
HCT: 37.2 % (ref 36.0–46.0)
Hemoglobin: 12.1 g/dL (ref 12.0–15.0)
LYMPHS ABS: 1.1 10*3/uL (ref 0.7–4.0)
Lymphocytes Relative: 23 %
MCH: 31.4 pg (ref 26.0–34.0)
MCHC: 32.5 g/dL (ref 30.0–36.0)
MCV: 96.6 fL (ref 78.0–100.0)
MONOS PCT: 10 %
Monocytes Absolute: 0.5 10*3/uL (ref 0.1–1.0)
Neutro Abs: 2.8 10*3/uL (ref 1.7–7.7)
Neutrophils Relative %: 60 %
PLATELETS: 137 10*3/uL — AB (ref 150–400)
RBC: 3.85 MIL/uL — ABNORMAL LOW (ref 3.87–5.11)
RDW: 13.8 % (ref 11.5–15.5)
WBC: 4.7 10*3/uL (ref 4.0–10.5)

## 2017-04-21 LAB — PROTIME-INR
INR: 0.96
PROTHROMBIN TIME: 12.7 s (ref 11.4–15.2)

## 2017-04-21 MED ORDER — LIDOCAINE HCL 2 % IJ SOLN
INTRAMUSCULAR | Status: AC
  Filled 2017-04-21: qty 10

## 2017-04-21 MED ORDER — SODIUM CHLORIDE 0.9 % IV SOLN
INTRAVENOUS | Status: DC
  Administered 2017-04-21: 12:00:00 via INTRAVENOUS

## 2017-04-21 MED ORDER — FENTANYL CITRATE (PF) 100 MCG/2ML IJ SOLN
INTRAMUSCULAR | Status: AC
  Filled 2017-04-21: qty 2

## 2017-04-21 MED ORDER — LIDOCAINE HCL (PF) 1 % IJ SOLN
INTRAMUSCULAR | Status: AC | PRN
  Administered 2017-04-21: 10 mL

## 2017-04-21 MED ORDER — MIDAZOLAM HCL 2 MG/2ML IJ SOLN
INTRAMUSCULAR | Status: AC | PRN
  Administered 2017-04-21 (×2): 1 mg via INTRAVENOUS

## 2017-04-21 MED ORDER — FENTANYL CITRATE (PF) 100 MCG/2ML IJ SOLN
INTRAMUSCULAR | Status: AC | PRN
  Administered 2017-04-21 (×2): 50 ug via INTRAVENOUS

## 2017-04-21 MED ORDER — MIDAZOLAM HCL 2 MG/2ML IJ SOLN
INTRAMUSCULAR | Status: AC
  Filled 2017-04-21: qty 2

## 2017-04-21 MED ORDER — HYDROCODONE-ACETAMINOPHEN 5-325 MG PO TABS: 1.0000 | ORAL_TABLET | ORAL | Status: DC | PRN

## 2017-04-21 NOTE — Procedures (Signed)
  Procedure:   CT liver biopsy 18g x3 Preprocedure diagnosis:  CIrrhosis, liver lesion Postprocedure diagnosis:  same EBL:     minimal Complications:   none immediate  See full dictation in [facility].  Dillard Cannon MD Main # [phone] Pager  [phone]

## 2017-04-21 NOTE — H&P (Signed)
Chief Complaint: liver cirrhosis with liver mass  Referring Physician:Dr. Owens Loffler  Supervising Physician: Arne Cleveland  Patient Status: [facility] - Out-pt  HPI: Kelsey Keller is a 76 y.o. female [place] and began to have abdominal pain and feel very poorly.  She laid in bed for 2 days and then wanted to return home.  She followed up with her PCP who noted her LFTs to be abnormal.  She then underwent a CT of her abdomen which revealed changes c/w cirrhosis  As well as a nonspecific hypodense lesion in the right hepatic dome.  Finally a mildly thickened gallbladder wall without distention but with some laying stones.  She was referred to Dr. Donne Hazel who obtained an MRI.  This also revealed changes c/w cirrhosis and two lesions in segment 8 of the liver that were worrisome for non-HCC malignancy.  She was referred back to GI who has requested a random liver biopsy as well as a liver lesion biopsy.  In the interim she has had an EGD that revealed an ulcer.  She was started on a PPI and her abdominal pain has resolved.    Past Medical History:  Past Medical History:  Diagnosis Date  . ALLERGIC RHINITIS 04/29/2007  . Allergy   . Cataract    both eyes  . DIABETES MELLITUS, TYPE II 06/23/2007   type 2  . Elevated liver enzymes 03/2017  . Elevated tumor markers   . GERD (gastroesophageal reflux disease)   . HIATAL HERNIA, HX OF 04/29/2007  . HYPERLIPIDEMIA 04/29/2007   diet controlled on CoQ10  . HYPERTENSION 04/29/2007  . HYPOKALEMIA 05/02/2009  . HYPOTHYROIDISM 04/29/2007  . LOW BACK PAIN 04/29/2007  . Neuromuscular disorder ([place])    diabetic neuropathy in both legs  . OSTEOARTHRITIS, KNEE, LEFT 06/23/2007  . OSTEOPOROSIS NOS 05/12/2007  . SPINAL STENOSIS, LUMBAR 04/29/2007    Past Surgical History:  Past Surgical History:  Procedure Laterality Date  . BACK SURGERY N/A 2014 x 2   T1 TO S 1   . CATARACT EXTRACTION W/ INTRAOCULAR LENS  IMPLANT,  BILATERAL Bilateral 05/2011   doing well  . COLONOSCOPY WITH PROPOFOL N/A 04/10/2017   Procedure: COLONOSCOPY WITH PROPOFOL;  Surgeon: [name], MD;  Location: [facility];  Service: Endoscopy;  Laterality: N/A;  . COLONSCOPY  2011  . TOTAL KNEE ARTHROPLASTY  2006   right  . TOTAL KNEE ARTHROPLASTY Left 2012    Family History:  Family History  Problem Relation Age of Onset  . Coronary artery disease Other        Female 1st degree relative  . Diabetes Other        1st degree relative  . Colon polyps Other   . Arthritis Mother   . Hypertension Mother   . Coronary artery disease Father   . Arthritis Brother   . Stroke Brother   . Hyperlipidemia Brother   . COPD Brother   . Colon cancer Neg Hx   . Esophageal cancer Neg Hx   . Rectal cancer Neg Hx   . Stomach cancer Neg Hx     Social History:  reports that she has never smoked. She has never used smokeless tobacco. She reports that she drinks alcohol. She reports that she does not use drugs.  Allergies: No Known Allergies  Medications: Medications reviewed in epic  Please HPI for pertinent positives, otherwise complete 10 system ROS negative.  Mallampati Score: MD Evaluation Airway:  WNL Heart: Other (comments) Heart  comments: + murmur Abdomen: WNL ASA  Classification: 2 Mallampati/Airway Score: One  Physical Exam: BP (!) 146/70 (BP Location: Right Arm)   Pulse 64   Temp 97.9 F (36.6 C) (Oral)   Resp 16   Ht 5\' 6"  (1.676 m)   Wt 256 lb (116.1 kg)   SpO2 97%   BMI 41.32 kg/m  Body mass index is 41.32 kg/m. General: pleasant, obese white female who is laying in bed in NAD HEENT: head is normocephalic, atraumatic.  Sclera are noninjected.  PERRL.  Ears and nose without any masses or lesions.  Mouth is pink and moist Heart: regular, rate, and rhythm.  Normal s1,s2. +murmur. No obvious gallops, or rubs noted.  Palpable radial and pedal pulses bilaterally Lungs: CTAB, no wheezes, rhonchi, or rales noted.   Respiratory effort nonlabored Abd: soft, NT, ND, +BS, no masses, hernias, or organomegaly Psych: A&Ox3 with an appropriate affect.   Labs: Results for orders placed or performed during the hospital encounter of 04/21/17 (from the past 48 hour(s))  CBC with Differential/Platelet     Status: Abnormal   Collection Time: 04/21/17 11:33 AM  Result Value Ref Range   WBC 4.7 4.0 - 10.5 K/uL   RBC 3.85 (L) 3.87 - 5.11 MIL/uL   Hemoglobin 12.1 12.0 - 15.0 g/dL   HCT 37.2 36.0 - 46.0 %   MCV 96.6 78.0 - 100.0 fL   MCH 31.4 26.0 - 34.0 pg   MCHC 32.5 30.0 - 36.0 g/dL   RDW 13.8 11.5 - 15.5 %   Platelets 137 (L) 150 - 400 K/uL   Neutrophils Relative % 60 %   Neutro Abs 2.8 1.7 - 7.7 K/uL   Lymphocytes Relative 23 %   Lymphs Abs 1.1 0.7 - 4.0 K/uL   Monocytes Relative 10 %   Monocytes Absolute 0.5 0.1 - 1.0 K/uL   Eosinophils Relative 7 %   Eosinophils Absolute 0.3 0.0 - 0.7 K/uL   Basophils Relative 0 %   Basophils Absolute 0.0 0.0 - 0.1 K/uL    Imaging: No results found.  Assessment/Plan 1. Liver lesions and cirrhosis  We will plan today to proceed with a random liver biopsy as well as a liver mass biopsy.  Labs and vitals have been reviewed. Risks and benefits discussed with the patient including, but not limited to bleeding, infection, damage to adjacent structures or low yield requiring additional tests. All of the patient's questions were answered, patient is agreeable to proceed. Consent signed and in chart.   Thank you for this interesting consult.  I greatly enjoyed meeting Kelsey Keller and look forward to participating in their care.  A copy of this report was sent to the requesting provider on this date.  Electronically Signed: Henreitta Cea 04/21/2017, 12:08 PM   I spent a total of  30 Minutes   in face to face in clinical consultation, greater than 50% of which was counseling/coordinating care for liver lesion and cirrhosis

## 2017-04-21 NOTE — Discharge Instructions (Signed)
Moderate Conscious Sedation, Adult, Care After °These instructions provide you with information about caring for yourself after your procedure. Your health care provider may also give you more specific instructions. Your treatment has been planned according to current medical practices, but problems sometimes occur. Call your health care provider if you have any problems or questions after your procedure. °What can I expect after the procedure? °After your procedure, it is common: °· To feel sleepy for several hours. °· To feel clumsy and have poor balance for several hours. °· To have poor judgment for several hours. °· To vomit if you eat too soon. ° °Follow these instructions at home: °For at least 24 hours after the procedure: ° °· Do not: °? Participate in activities where you could fall or become injured. °? Drive. °? Use heavy machinery. °? Drink alcohol. °? Take sleeping pills or medicines that cause drowsiness. °? Make important decisions or sign legal documents. °? Take care of children on your own. °· Rest. °Eating and drinking °· Follow the diet recommended by your health care provider. °· If you vomit: °? Drink water, juice, or soup when you can drink without vomiting. °? Make sure you have little or no nausea before eating solid foods. °General instructions °· Have a responsible adult stay with you until you are awake and alert. °· Take over-the-counter and prescription medicines only as told by your health care provider. °· If you smoke, do not smoke without supervision. °· Keep all follow-up visits as told by your health care provider. This is important. °Contact a health care provider if: °· You keep feeling nauseous or you keep vomiting. °· You feel light-headed. °· You develop a rash. °· You have a fever. °Get help right away if: °· You have trouble breathing. °This information is not intended to replace advice given to you by your health care provider. Make sure you discuss any questions you have  with your health care provider. °Document Released: 04/21/2013 Document Revised: 12/04/2015 Document Reviewed: 10/21/2015 °Elsevier Interactive Patient Education © 2018 [employer] ° ° °Liver Biopsy, Care After °These instructions give you information on caring for yourself after your procedure. Your doctor may also give you more specific instructions. Call your doctor if you have any problems or questions after your procedure. °Follow these instructions at home: °· Rest at home for 1-2 days or as told by your doctor. °· Have someone stay with you for at least 24 hours. °· Do not do these things in the first 24 hours: °? Drive. °? Use machinery. °? Take care of other people. °? Sign legal documents. °? Take a bath or shower. °· There are many different ways to close and cover a cut (incision). For example, a cut can be closed with stitches, skin glue, or adhesive strips. Follow your doctor's instructions on: °? Taking care of your cut. °? Changing and removing your bandage (dressing). °? Removing whatever was used to close your cut. °· Do not drink alcohol in the first week. °· Do not lift more than 5 pounds or play contact sports for the first 2 weeks. °· Take medicines only as told by your doctor. For 1 week, do not take medicine that has aspirin in it or medicines like ibuprofen. °· Get your test results. °Contact a doctor if: °· A cut bleeds and leaves more than just a small spot of blood. °· A cut is red, puffs up (swells), or hurts more than before. °· Fluid or something else   comes from a cut. °· A cut smells bad. °· You have a fever or chills. °Get help right away if: °· You have swelling, bloating, or pain in your belly (abdomen). °· You get dizzy or faint. °· You have a rash. °· You feel sick to your stomach (nauseous) or throw up (vomit). °· You have trouble breathing, feel short of breath, or feel faint. °· Your chest hurts. °· You have problems talking or seeing. °· You have trouble balancing or moving  your arms or legs. °This information is not intended to replace advice given to you by your health care provider. Make sure you discuss any questions you have with your health care provider. °Document Released: 04/09/2008 Document Revised: 12/07/2015 Document Reviewed: 08/27/2013 °Elsevier Interactive Patient Education © 2018 [employer] ° ° °

## 2017-04-28 ENCOUNTER — Telehealth: Payer: Self-pay | Admitting: Gastroenterology

## 2017-04-28 NOTE — Telephone Encounter (Signed)
Left message on machine to call back  

## 2017-04-28 NOTE — Telephone Encounter (Signed)
Dr Ardis Hughs has not reviewed as of today

## 2017-04-30 ENCOUNTER — Other Ambulatory Visit: Payer: Self-pay

## 2017-04-30 DIAGNOSIS — K7460 Unspecified cirrhosis of liver: Secondary | ICD-10-CM

## 2017-04-30 NOTE — Telephone Encounter (Signed)
See result note.  

## 2017-05-05 DIAGNOSIS — R69 Illness, unspecified: Secondary | ICD-10-CM | POA: Diagnosis not present

## 2017-05-20 DIAGNOSIS — E119 Type 2 diabetes mellitus without complications: Secondary | ICD-10-CM | POA: Diagnosis not present

## 2017-05-20 LAB — HM DIABETES EYE EXAM

## 2017-05-21 ENCOUNTER — Other Ambulatory Visit: Payer: Self-pay | Admitting: Nurse Practitioner

## 2017-05-21 DIAGNOSIS — M5416 Radiculopathy, lumbar region: Secondary | ICD-10-CM

## 2017-05-28 ENCOUNTER — Other Ambulatory Visit (INDEPENDENT_AMBULATORY_CARE_PROVIDER_SITE_OTHER): Payer: Medicare HMO

## 2017-05-28 DIAGNOSIS — K7460 Unspecified cirrhosis of liver: Secondary | ICD-10-CM

## 2017-05-28 LAB — HEPATIC FUNCTION PANEL
ALBUMIN: 3.7 g/dL (ref 3.5–5.2)
ALK PHOS: 69 U/L (ref 39–117)
ALT: 24 U/L (ref 0–35)
AST: 37 U/L (ref 0–37)
Bilirubin, Direct: 0.3 mg/dL (ref 0.0–0.3)
Total Bilirubin: 0.9 mg/dL (ref 0.2–1.2)
Total Protein: 6.8 g/dL (ref 6.0–8.3)

## 2017-05-29 ENCOUNTER — Other Ambulatory Visit: Payer: Self-pay | Admitting: Internal Medicine

## 2017-05-29 ENCOUNTER — Ambulatory Visit
Admission: RE | Admit: 2017-05-29 | Discharge: 2017-05-29 | Disposition: A | Discharge status: Home / Self Care | Payer: Medicare HMO | Source: Ambulatory Visit | Attending: Nurse Practitioner | Admitting: Nurse Practitioner

## 2017-05-29 DIAGNOSIS — M545 Low back pain: Secondary | ICD-10-CM | POA: Diagnosis not present

## 2017-05-29 DIAGNOSIS — M5416 Radiculopathy, lumbar region: Secondary | ICD-10-CM

## 2017-05-29 MED ORDER — METHYLPREDNISOLONE ACETATE 40 MG/ML INJ SUSP (RADIOLOG
120.0000 mg | Freq: Once | INTRAMUSCULAR | Status: AC
  Administered 2017-05-29: 120 mg via EPIDURAL

## 2017-05-29 MED ORDER — IOPAMIDOL (ISOVUE-M 200) INJECTION 41%
1.0000 mL | Freq: Once | INTRAMUSCULAR | Status: AC
  Administered 2017-05-29: 1 mL via EPIDURAL

## 2017-05-29 NOTE — Discharge Instructions (Signed)

## 2017-06-02 ENCOUNTER — Encounter: Payer: Self-pay | Admitting: Gastroenterology

## 2017-06-02 ENCOUNTER — Ambulatory Visit: Payer: Medicare HMO | Admitting: Gastroenterology

## 2017-06-02 VITALS — BP 134/74 | HR 64 | Ht 64.75 in | Wt 251.5 lb

## 2017-06-02 DIAGNOSIS — K269 Duodenal ulcer, unspecified as acute or chronic, without hemorrhage or perforation: Secondary | ICD-10-CM | POA: Diagnosis not present

## 2017-06-02 DIAGNOSIS — R945 Abnormal results of liver function studies: Secondary | ICD-10-CM

## 2017-06-02 DIAGNOSIS — R160 Hepatomegaly, not elsewhere classified: Secondary | ICD-10-CM | POA: Diagnosis not present

## 2017-06-02 DIAGNOSIS — R7989 Other specified abnormal findings of blood chemistry: Secondary | ICD-10-CM

## 2017-06-02 NOTE — Progress Notes (Signed)
Review of pertinent gastrointestinal problems: 1. Cirrhosis: (Very possibly "burned out" autoimmune hepatitis ) CT abdomen and pelvis done on 03/12/2017 showed a nodular liver consistent with cirrhosis, there is a 9 mm lesion at the right hepatic dome too small to characterize, no biliary dilation, she did have a thick-walled gallbladder with multiple gallstones, no splenomegaly but noted splenic varices.  Laboratory workup for cirrhosis September 2018: ANA was + 1:160 titer; anti-smooth muscle antibody +, hepatitis A total antibody negative, hepatitis B surface antigen negative, hepatitis B surface antibody negative, hepatitis C antibody negative, ceruloplasmin normal, iron studies normal, alpha-1 antitrypsin level normal, AMA negative  Liver lesion on MRI 03/2017; peripherally enhancing T2 hyperintense and T1 hypointense lesion within segment 8 of the liver, measuring 1.8 cm, and indeterminant. (IR was unable biopsy this); my plan is for repeat MRI March 2019  AFP 03/2017; 3.0 (normal)  EGD 03/2017; no signs of portal hypertension  Liver biopsy 04/2017: Cirrhosis with mild cholestasis.  Pathology felt possibly this was possibly due to an autoimmune process, 2. Colonoscopy 03/2017 (for slightly elevated CEA and liver lesion on imaging): normal TI, normal colon 3.  Abdominal pain, elevated liver tests, September 2018.  Gallstones in her gallbladder on imaging however pain not completely consistent with biliary process.  EGD showed with deep duodenal bulb ulcer.  Biopsies of her stomach showed no H. Pylori.  Seems likely the ulcer was from unopposed meloxicam.  Her pains improved quickly with proton pump inhibitor, her liver tests normalized as well.    HPI: This is a very pleasant 76 year old woman whom I last saw about 2 months ago  She has really been feeling very well lately.  Ever since she started proton pump inhibitor twice daily her abdominal pains have resolved and her liver tests have  completely normalized as well.  She is eating well.  Was on meloxican once daily, one pill. Had been taking tums. Takes protonix twice daily.  Was having pain in epigastrium   Chief complaint is cirrhosis, elevated liver tests  ROS: complete GI ROS as described in HPI, all other review negative.  Constitutional:  No unintentional weight loss   Past Medical History:  Diagnosis Date  . ALLERGIC RHINITIS 04/29/2007  . Allergy   . Cataract    both eyes  . DIABETES MELLITUS, TYPE II 06/23/2007   type 2  . Elevated liver enzymes 03/2017  . Elevated tumor markers   . GERD (gastroesophageal reflux disease)   . HIATAL HERNIA, HX OF 04/29/2007  . HYPERLIPIDEMIA 04/29/2007   diet controlled on CoQ10  . HYPERTENSION 04/29/2007  . HYPOKALEMIA 05/02/2009  . HYPOTHYROIDISM 04/29/2007  . LOW BACK PAIN 04/29/2007  . Neuromuscular disorder ([place])    diabetic neuropathy in both legs  . OSTEOARTHRITIS, KNEE, LEFT 06/23/2007  . OSTEOPOROSIS NOS 05/12/2007  . SPINAL STENOSIS, LUMBAR 04/29/2007    Past Surgical History:  Procedure Laterality Date  . BACK SURGERY N/A 2014 x 2   T1 TO S 1   . CATARACT EXTRACTION W/ INTRAOCULAR LENS  IMPLANT, BILATERAL Bilateral 05/2011   doing well  . COLONOSCOPY WITH PROPOFOL N/A 04/10/2017   Performed by [name], MD at [facility]  . COLONSCOPY  2011  . TOTAL KNEE ARTHROPLASTY  2006   right  . TOTAL KNEE ARTHROPLASTY Left 2012    Current Outpatient Medications  Medication Sig Dispense Refill  . ACCU-CHEK SOFTCLIX LANCETS lancets 1 each by Other route 2 (two) times daily. Use to help check blood  sugars twice a day Dx e11.9 100 each 3  . alendronate (FOSAMAX) 70 MG tablet TAKE 1 TABLET ONCE A WEEK. TAKE WITH A FULL GLASS OF WATER ON AN EMPTY STOMACH. (Patient taking differently: Take 70 mg by mouth once a week. On sunday) 12 tablet 3  . aspirin 81 MG EC tablet Take 81 mg by mouth daily.      Marland Kitchen atenolol-chlorthalidone (TENORETIC) 50-25 MG tablet  Take 1 tablet by mouth daily. 90 tablet 3  . Blood Glucose Calibration (ACCU-CHEK COMPACT PLUS CONTROL) SOLN Use as directed Dx E11.9 3 each 3  . Blood Glucose Monitoring Suppl (ACCU-CHEK AVIVA PLUS) w/Device KIT Use as directed to check blood sugars everyday Dx e11.9 1 kit 0  . Calcium Carb-Cholecalciferol (CALCIUM 600+D3) 600-800 MG-UNIT TABS Take 1 tablet by mouth every evening.    . calcium carbonate (TUMS - DOSED IN MG ELEMENTAL CALCIUM) 500 MG chewable tablet Chew 1 tablet by mouth daily.    . cetirizine (ZYRTEC) 10 MG tablet Take 1 tablet (10 mg total) by mouth daily. (Patient taking differently: Take 10 mg by mouth daily as needed for allergies or rhinitis. ) 90 tablet 3  . Co-Enzyme Q-10 100 MG CAPS Take 100 mg by mouth daily.    . fluticasone (FLONASE) 50 MCG/ACT nasal spray Place 2 sprays into both nostrils daily. 48 g 3  . gabapentin (NEURONTIN) 300 MG capsule 1 pill in AM, 1 pill PM and 2 pills at night (Patient taking differently: Take See admin instructions by mouth. Take 1 capsule in the morning and 1 at night) 360 capsule 3  . glimepiride (AMARYL) 4 MG tablet Take 1 tablet (4 mg total) by mouth 2 (two) times daily. 180 tablet 3  . glucose blood test strip Use as directed two times a day, diagnosis code E11.9 200 each 3  . levothyroxine (SYNTHROID, LEVOTHROID) 175 MCG tablet TAKE 1 TABLET (175 MCG TOTAL) BY MOUTH DAILY BEFORE BREAKFAST. 90 tablet 2  . meloxicam (MOBIC) 15 MG tablet Take 1 tablet (15 mg total) by mouth daily. (Patient taking differently: Take 7.5 mg by mouth daily. ) 90 tablet 3  . metFORMIN (GLUCOPHAGE) 1000 MG tablet Take 1 tablet (1,000 mg total) by mouth 2 (two) times daily with a meal. 180 tablet 3  . omeprazole (PRILOSEC) 40 MG capsule Take 1 pill 30 minutes before breakfast and dinner 60 capsule 6  . pioglitazone (ACTOS) 15 MG tablet Take 1 tablet (15 mg total) by mouth daily. 90 tablet 3  . Red Yeast Rice Extract 600 MG CAPS Take 1,200 mg by mouth 2 (two)  times daily.    . traMADol (ULTRAM) 50 MG tablet Take 1 tablet (50 mg total) by mouth every 6 (six) hours as needed. 60 tablet 2   No current facility-administered medications for this visit.     Allergies as of 06/02/2017  . (No Known Allergies)    Family History  Problem Relation Age of Onset  . Coronary artery disease Other        Female 1st degree relative  . Diabetes Other        1st degree relative  . Colon polyps Other   . Arthritis Mother   . Hypertension Mother   . Coronary artery disease Father   . Arthritis Brother   . Stroke Brother   . Hyperlipidemia Brother   . COPD Brother   . Colon cancer Neg Hx   . Esophageal cancer Neg Hx   . Rectal cancer  Neg Hx   . Stomach cancer Neg Hx     Social History   Socioeconomic History  . Marital status: Divorced    Spouse name: Not on file  . Number of children: Not on file  . Years of education: Not on file  . Highest education level: Not on file  Social Needs  . Financial resource strain: Not on file  . Food insecurity - worry: Not on file  . Food insecurity - inability: Not on file  . Transportation needs - medical: Not on file  . Transportation needs - non-medical: Not on file  Occupational History  . 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 Smoking status: Never Smoker  . Smokeless tobacco: Never Used  Substance and Sexual Activity  . Alcohol use: Yes    Alcohol/week: 0.0 oz    Comment: One or two drinks in month  . Drug use: No  . Sexual activity: Not on file  Other Topics Concern  . Not on file  Social History Narrative  . Not on file     Physical Exam: BP 134/74 (BP Location: Left Arm, Patient Position: Sitting, Cuff Size: Normal)   Pulse 64   Ht 5' 4.75" (1.645 m) Comment: height measured without shoes  Wt 251 lb 8 oz (114.1 kg)   BMI 42.18 kg/m  Constitutional: generally well-appearing Psychiatric: alert and oriented x3 Abdomen: soft, nontender, nondistended, no  obvious ascites, no peritoneal signs, normal bowel sounds No peripheral edema noted in lower extremities  Assessment and plan: 76 y.o. female with compensated cirrhosis possibly from autoimmune process, indeterminate liver lesion, duodenal ulcer  She is overall doing very well.  She has well compensated cirrhosis.  She does have an indeterminate liver lesion in segment 8 that will need to be followed.  My plan is for repeat MRI in March 2019 which is 6 months since her last imaging.  I do think that the duodenal ulcer was probably causing all of her symptoms and probably her elevated liver tests as well.  It was certainly in a location where it could impinge on the common bile duct.  I bet it was NSAID related, she was on meloxicam unopposed by proton pump inhibitor.  She feels so much better since starting the proton pump inhibitor twice daily.  I think it is okay that she cut back to once daily dosing starting today.  She will also try to find a substitute for her 1/2 pill of meloxicam daily which she is now taking.  Please see the "Patient Instructions" section for addition details about the plan.  Owens Loffler, MD [facility] 06/02/2017, 9:52 AM

## 2017-06-02 NOTE — Patient Instructions (Addendum)
MRI of liver in 09/2017 to follow the liver lesion.  Try tylenol 1gram (in place of meloxicam once daily).  Decrease omeprazole to once daily dosing (in AM only).  Please return to see Dr. Ardis Hughs in 3 months (cmet, inr, cbc a day or so prior)  It is important that you have a relatively low salt diet.  High salt diet can cause fluid to accumulate in your legs, abdomen and even around your lungs. You should try to avoid NSAID type over the counter pain medicines as best as possible.  Tylenol is safe to take for 'routine' aches and pains, but never take more than 1/2 the dose suggested on the package instructions (never more than 2 grams per day).  Avoid alcohol.  Normal BMI (Body Mass Index- based on height and weight) is between 19 and 25. Your BMI today is Body mass index is 42.18 kg/m. Marland Kitchen Please consider follow up  regarding your BMI with your Primary Care Provider.

## 2017-07-13 ENCOUNTER — Other Ambulatory Visit: Payer: Self-pay | Admitting: Internal Medicine

## 2017-07-16 ENCOUNTER — Other Ambulatory Visit (INDEPENDENT_AMBULATORY_CARE_PROVIDER_SITE_OTHER): Payer: Medicare HMO

## 2017-07-16 DIAGNOSIS — E118 Type 2 diabetes mellitus with unspecified complications: Secondary | ICD-10-CM

## 2017-07-16 DIAGNOSIS — Z0000 Encounter for general adult medical examination without abnormal findings: Secondary | ICD-10-CM | POA: Diagnosis not present

## 2017-07-16 LAB — LIPID PANEL
CHOLESTEROL: 158 mg/dL (ref 0–200)
HDL: 58.2 mg/dL (ref >39.00)
LDL CALC: 76 mg/dL (ref 0–99)
NonHDL: 99.77
TRIGLYCERIDES: 119 mg/dL (ref 0.0–149.0)
Total CHOL/HDL Ratio: 3
VLDL: 23.8 mg/dL (ref 0.0–40.0)

## 2017-07-16 LAB — CBC WITH DIFFERENTIAL/PLATELET
BASOS ABS: 0 10*3/uL (ref 0.0–0.1)
Basophils Relative: 0.6 % (ref 0.0–3.0)
EOS ABS: 0.2 10*3/uL (ref 0.0–0.7)
Eosinophils Relative: 3.8 % (ref 0.0–5.0)
HCT: 37.6 % (ref 36.0–46.0)
Hemoglobin: 12.6 g/dL (ref 12.0–15.0)
Lymphocytes Relative: 21 % (ref 12.0–46.0)
Lymphs Abs: 0.9 10*3/uL (ref 0.7–4.0)
MCHC: 33.4 g/dL (ref 30.0–36.0)
MCV: 93.8 fl (ref 78.0–100.0)
MONOS PCT: 10.2 % (ref 3.0–12.0)
Monocytes Absolute: 0.4 10*3/uL (ref 0.1–1.0)
Neutro Abs: 2.6 10*3/uL (ref 1.4–7.7)
Neutrophils Relative %: 64.4 % (ref 43.0–77.0)
Platelets: 142 10*3/uL — ABNORMAL LOW (ref 150.0–400.0)
RBC: 4.01 Mil/uL (ref 3.87–5.11)
RDW: 13.9 % (ref 11.5–15.5)
WBC: 4.1 10*3/uL (ref 4.0–10.5)

## 2017-07-16 LAB — BASIC METABOLIC PANEL
BUN: 22 mg/dL (ref 6–23)
CO2: 30 mEq/L (ref 19–32)
Calcium: 9.9 mg/dL (ref 8.4–10.5)
Chloride: 99 mEq/L (ref 96–112)
Creatinine, Ser: 0.91 mg/dL (ref 0.40–1.20)
GFR: 64.1 mL/min (ref >60.00)
GLUCOSE: 129 mg/dL — AB (ref 70–99)
POTASSIUM: 4 meq/L (ref 3.5–5.1)
Sodium: 138 mEq/L (ref 135–145)

## 2017-07-16 LAB — URINALYSIS, ROUTINE W REFLEX MICROSCOPIC
BILIRUBIN URINE: NEGATIVE
KETONES UR: NEGATIVE
LEUKOCYTES UA: NEGATIVE
NITRITE: NEGATIVE
RBC / HPF: NONE SEEN (ref 0–?)
Specific Gravity, Urine: 1.01 (ref 1.000–1.030)
TOTAL PROTEIN, URINE-UPE24: NEGATIVE
UROBILINOGEN UA: 0.2 (ref 0.0–1.0)
Urine Glucose: NEGATIVE
pH: 6 (ref 5.0–8.0)

## 2017-07-16 LAB — MICROALBUMIN / CREATININE URINE RATIO
Creatinine,U: 51.6 mg/dL
MICROALB/CREAT RATIO: 1.4 mg/g (ref 0.0–30.0)
Microalb, Ur: <0.7 mg/dL (ref 0.0–1.9)

## 2017-07-16 LAB — HEMOGLOBIN A1C: Hgb A1c MFr Bld: 7.8 % — ABNORMAL HIGH (ref 4.6–6.5)

## 2017-07-16 LAB — HEPATIC FUNCTION PANEL
ALT: 21 U/L (ref 0–35)
AST: 31 U/L (ref 0–37)
Albumin: 3.9 g/dL (ref 3.5–5.2)
Alkaline Phosphatase: 74 U/L (ref 39–117)
Bilirubin, Direct: 0.2 mg/dL (ref 0.0–0.3)
Total Bilirubin: 0.9 mg/dL (ref 0.2–1.2)
Total Protein: 6.6 g/dL (ref 6.0–8.3)

## 2017-07-16 LAB — TSH: TSH: 0.55 u[IU]/mL (ref 0.35–4.50)

## 2017-07-16 NOTE — Telephone Encounter (Signed)
Done erx 

## 2017-07-21 ENCOUNTER — Encounter: Payer: Self-pay | Admitting: Internal Medicine

## 2017-07-21 ENCOUNTER — Ambulatory Visit (INDEPENDENT_AMBULATORY_CARE_PROVIDER_SITE_OTHER): Payer: Medicare HMO | Admitting: Internal Medicine

## 2017-07-21 VITALS — BP 128/86 | HR 82 | Temp 97.8°F | Ht 64.75 in | Wt 259.0 lb

## 2017-07-21 DIAGNOSIS — G8929 Other chronic pain: Secondary | ICD-10-CM

## 2017-07-21 DIAGNOSIS — Z0000 Encounter for general adult medical examination without abnormal findings: Secondary | ICD-10-CM

## 2017-07-21 DIAGNOSIS — M545 Low back pain: Secondary | ICD-10-CM | POA: Diagnosis not present

## 2017-07-21 DIAGNOSIS — Z0001 Encounter for general adult medical examination with abnormal findings: Secondary | ICD-10-CM

## 2017-07-21 DIAGNOSIS — E785 Hyperlipidemia, unspecified: Secondary | ICD-10-CM | POA: Diagnosis not present

## 2017-07-21 DIAGNOSIS — E118 Type 2 diabetes mellitus with unspecified complications: Secondary | ICD-10-CM | POA: Diagnosis not present

## 2017-07-21 MED ORDER — ZOSTER VAC RECOMB ADJUVANTED 50 MCG/0.5ML IM SUSR: 0.5000 mL | Freq: Once | INTRAMUSCULAR | 1 refills | Status: AC

## 2017-07-21 MED ORDER — GABAPENTIN 300 MG PO CAPS: ORAL_CAPSULE | ORAL | 3 refills | Status: DC

## 2017-07-21 MED ORDER — FLUTICASONE PROPIONATE 50 MCG/ACT NA SUSP: 2.0000 | Freq: Every day | NASAL | 3 refills | Status: DC

## 2017-07-21 MED ORDER — GLIMEPIRIDE 4 MG PO TABS: 4.0000 mg | ORAL_TABLET | Freq: Two times a day (BID) | ORAL | 3 refills | Status: DC

## 2017-07-21 MED ORDER — MELOXICAM 15 MG PO TABS: 7.5000 mg | ORAL_TABLET | Freq: Every day | ORAL | 3 refills | Status: DC

## 2017-07-21 MED ORDER — TRAMADOL HCL 50 MG PO TABS: 50.0000 mg | ORAL_TABLET | Freq: Four times a day (QID) | ORAL | 2 refills | Status: DC | PRN

## 2017-07-21 MED ORDER — ALENDRONATE SODIUM 70 MG PO TABS: 70.0000 mg | ORAL_TABLET | ORAL | 3 refills | Status: DC

## 2017-07-21 MED ORDER — ATENOLOL-CHLORTHALIDONE 50-25 MG PO TABS: 1.0000 | ORAL_TABLET | Freq: Every day | ORAL | 3 refills | Status: DC

## 2017-07-21 MED ORDER — PIOGLITAZONE HCL 15 MG PO TABS: 15.0000 mg | ORAL_TABLET | Freq: Every day | ORAL | 3 refills | Status: DC

## 2017-07-21 MED ORDER — LEVOTHYROXINE SODIUM 175 MCG PO TABS: 175.0000 ug | ORAL_TABLET | Freq: Every day | ORAL | 3 refills | Status: DC

## 2017-07-21 MED ORDER — METFORMIN HCL 1000 MG PO TABS: 1000.0000 mg | ORAL_TABLET | Freq: Two times a day (BID) | ORAL | 3 refills | Status: DC

## 2017-07-21 NOTE — Patient Instructions (Addendum)
Your shingles shot was sent tot the pharmacy  Please continue all other medications as before, and refills have been done if requested.  Please have the pharmacy call with any other refills you may need.  Please continue your efforts at being more active, low cholesterol diet, and weight control.  You are otherwise up to date with prevention measures today.  Please keep your appointments with your specialists as you may have planned  Please return in 6 months, or sooner if needed, with Lab testing done 3-5 days before

## 2017-07-21 NOTE — Progress Notes (Signed)
Subjective:    Patient ID: Kelsey Keller, female    DOB: October 09, 1940, 77 y.o.   MRN: [id number]  HPI  Here for wellness and f/u;  Overall doing ok;  Pt denies Chest pain, worsening SOB, DOE, wheezing, orthopnea, PND, worsening LE edema, palpitations, dizziness or syncope.  Pt denies neurological change such as new headache, facial or extremity weakness.  Pt denies polydipsia, polyuria, or low sugar symptoms. Pt states overall good compliance with treatment and medications, good tolerability, and has been trying to follow appropriate diet.  Pt denies worsening depressive symptoms, suicidal ideation or panic. No fever, night sweats, wt loss, loss of appetite, or other constitutional symptoms.  Pt states good ability with ADL's, has low fall risk, home safety reviewed and adequate, no other significant changes in hearing or vision, and not active with regular exercise. Now walks with cane due to back issue and neuropathy and balance/gait disorder. Seen and tx for PUD per GI, now Denies worsening reflux, abd pain, dysphagia, n/v, bowel change or blood.  Pt continues to have recurring LBP, without bowel or bladder change, fever, wt loss,  Past Medical History:  Diagnosis Date  . ALLERGIC RHINITIS 04/29/2007  . Allergy   . Cataract    both eyes  . DIABETES MELLITUS, TYPE II 06/23/2007   type 2  . Elevated liver enzymes 03/2017  . Elevated tumor markers   . GERD (gastroesophageal reflux disease)   . HIATAL HERNIA, HX OF 04/29/2007  . HYPERLIPIDEMIA 04/29/2007   diet controlled on CoQ10  . HYPERTENSION 04/29/2007  . HYPOKALEMIA 05/02/2009  . HYPOTHYROIDISM 04/29/2007  . LOW BACK PAIN 04/29/2007  . Neuromuscular disorder ([place])    diabetic neuropathy in both legs  . OSTEOARTHRITIS, KNEE, LEFT 06/23/2007  . OSTEOPOROSIS NOS 05/12/2007  . SPINAL STENOSIS, LUMBAR 04/29/2007   Past Surgical History:  Procedure Laterality Date  . BACK SURGERY N/A 2014 x 2   T1 TO S 1   . CATARACT EXTRACTION W/  INTRAOCULAR LENS  IMPLANT, BILATERAL Bilateral 05/2011   doing well  . COLONOSCOPY WITH PROPOFOL N/A 04/10/2017   Procedure: COLONOSCOPY WITH PROPOFOL;  Surgeon: [name], MD;  Location: [facility];  Service: Endoscopy;  Laterality: N/A;  . COLONSCOPY  2011  . TOTAL KNEE ARTHROPLASTY  2006   right  . TOTAL KNEE ARTHROPLASTY Left 2012    reports that  has never smoked. she has never used smokeless tobacco. She reports that she drinks alcohol. She reports that she does not use drugs. family history includes Arthritis in her brother and mother; COPD in her brother; Colon polyps in her other; Coronary artery disease in her father and other; Diabetes in her other; Hyperlipidemia in her brother; Hypertension in her mother; Stroke in her brother. No Known Allergies Current Outpatient Medications on File Prior to Visit  Medication Sig Dispense Refill  . ACCU-CHEK SOFTCLIX LANCETS lancets 1 each by Other route 2 (two) times daily. Use to help check blood sugars twice a day Dx e11.9 100 each 3  . aspirin 81 MG EC tablet Take 81 mg by mouth daily.      . Blood Glucose Calibration (ACCU-CHEK COMPACT PLUS CONTROL) SOLN Use as directed Dx E11.9 3 each 3  . Blood Glucose Monitoring Suppl (ACCU-CHEK AVIVA PLUS) w/Device KIT Use as directed to check blood sugars everyday Dx e11.9 1 kit 0  . Calcium Carb-Cholecalciferol (CALCIUM 600+D3) 600-800 MG-UNIT TABS Take 1 tablet by mouth every evening.    . calcium  carbonate (TUMS - DOSED IN MG ELEMENTAL CALCIUM) 500 MG chewable tablet Chew 1 tablet by mouth daily.    . cetirizine (ZYRTEC) 10 MG tablet Take 1 tablet (10 mg total) by mouth daily. (Patient taking differently: Take 10 mg by mouth daily as needed for allergies or rhinitis. ) 90 tablet 3  . Co-Enzyme Q-10 100 MG CAPS Take 100 mg by mouth daily.    Marland Kitchen glucose blood test strip Use as directed two times a day, diagnosis code E11.9 200 each 3  . omeprazole (PRILOSEC) 40 MG capsule Take 1 pill 30  minutes before breakfast and dinner 60 capsule 6  . Red Yeast Rice Extract 600 MG CAPS Take 1,200 mg by mouth 2 (two) times daily.     No current facility-administered medications on file prior to visit.    Review of Systems Constitutional: Negative for other unusual diaphoresis, sweats, appetite or weight changes HENT: Negative for other worsening hearing loss, ear pain, facial swelling, mouth sores or neck stiffness.   Eyes: Negative for other worsening pain, redness or other visual disturbance.  Respiratory: Negative for other stridor or swelling Cardiovascular: Negative for other palpitations or other chest pain  Gastrointestinal: Negative for worsening diarrhea or loose stools, blood in stool, distention or other pain Genitourinary: Negative for hematuria, flank pain or other change in urine volume.  Musculoskeletal: Negative for myalgias or other joint swelling.  Skin: Negative for other color change, or other wound or worsening drainage.  Neurological: Negative for other syncope or numbness. Hematological: Negative for other adenopathy or swelling Psychiatric/Behavioral: Negative for hallucinations, other worsening agitation, SI, self-injury, or new decreased concentration All other system neg per pt    Objective:   Physical Exam . Vs VS noted,  Constitutional: Pt is oriented to person, place, and time. Appears well-developed and well-nourished, in no significant distress and comfortable Head: Normocephalic and atraumatic  Eyes: Conjunctivae and EOM are normal. Pupils are equal, round, and reactive to light Right Ear: External ear normal without discharge Left Ear: External ear normal without discharge Nose: Nose without discharge or deformity Mouth/Throat: Oropharynx is without other ulcerations and moist  Neck: Normal range of motion. Neck supple. No JVD present. No tracheal deviation present or significant neck LA or mass Cardiovascular: Normal rate, regular rhythm, normal  heart sounds and intact distal pulses.   Pulmonary/Chest: WOB normal and breath sounds without rales or wheezing  Abdominal: Soft. Bowel sounds are normal. NT. No HSM  Musculoskeletal: Normal range of motion. Exhibits no edema Lymphadenopathy: Has no other cervical adenopathy.  Neurological: Pt is alert and oriented to person, place, and time. Pt has normal reflexes. No cranial nerve deficit. Motor grossly intact, Gait intact Skin: Skin is warm and dry. No rash noted or new ulcerations Psychiatric:  Has normal mood and affect. Behavior is normal without agitation No other exam findings Lab Results  Component Value Date   WBC 4.1 07/16/2017   HGB 12.6 07/16/2017   HCT 37.6 07/16/2017   PLT 142.0 (L) 07/16/2017   GLUCOSE 129 (H) 07/16/2017   CHOL 158 07/16/2017   TRIG 119.0 07/16/2017   HDL 58.20 07/16/2017   LDLDIRECT 107.5 07/21/2013   LDLCALC 76 07/16/2017   ALT 21 07/16/2017   AST 31 07/16/2017   NA 138 07/16/2017   K 4.0 07/16/2017   CL 99 07/16/2017   CREATININE 0.91 07/16/2017   BUN 22 07/16/2017   CO2 30 07/16/2017   TSH 0.55 07/16/2017   INR 0.96 04/21/2017  HGBA1C 7.8 (H) 07/16/2017   MICROALBUR <0.7 07/16/2017      Assessment & Plan:

## 2017-07-22 ENCOUNTER — Ambulatory Visit: Payer: Medicare HMO | Admitting: Internal Medicine

## 2017-07-22 NOTE — Assessment & Plan Note (Signed)

## 2017-07-22 NOTE — Assessment & Plan Note (Signed)
Mild uncontrolled, declines increased OHA tx, to cont diet, wt control and activity as she can, f/u lab next visit

## 2017-07-22 NOTE — Assessment & Plan Note (Signed)
Chronic stable, for tramadol refill,  to f/u any worsening symptoms or concerns

## 2017-07-22 NOTE — Assessment & Plan Note (Signed)
Lab Results  Component Value Date   LDLCALC 76 07/16/2017  stable overall by history and exam, recent data reviewed with pt, and pt to continue medical treatment as before,  to f/u any worsening symptoms or concerns

## 2017-07-28 ENCOUNTER — Telehealth: Payer: Self-pay | Admitting: Internal Medicine

## 2017-07-28 MED ORDER — HYDROCODONE-HOMATROPINE 5-1.5 MG/5ML PO SYRP: 5.0000 mL | ORAL_SOLUTION | Freq: Four times a day (QID) | ORAL | 0 refills | Status: AC | PRN

## 2017-07-28 NOTE — Telephone Encounter (Signed)
Copied from [place] [phone]. Topic: Inquiry >> Jul 28, 2017  9:38 AM Corie Chiquito, [place] wrote: Reason for CRM: Patient calling because she would like to know if Dr.John could call her in something for coughing. Patient stated she has had this cough since friday

## 2017-07-28 NOTE — Telephone Encounter (Signed)
rx done erx 

## 2017-08-07 DIAGNOSIS — R69 Illness, unspecified: Secondary | ICD-10-CM | POA: Diagnosis not present

## 2017-09-02 ENCOUNTER — Telehealth: Payer: Self-pay

## 2017-09-02 NOTE — Telephone Encounter (Signed)
-----   Message from Jeoffrey Massed, RN sent at 06/02/2017 10:14 AM EST ----- MRI 09/2017 liver lesion

## 2017-09-02 NOTE — Telephone Encounter (Signed)
The pt has an appt with Dr Ardis Hughs in April she prefers to discuss repeat MRI at the office visit.

## 2017-09-04 ENCOUNTER — Other Ambulatory Visit: Payer: Self-pay

## 2017-09-04 DIAGNOSIS — K769 Liver disease, unspecified: Secondary | ICD-10-CM

## 2017-09-04 NOTE — Progress Notes (Signed)
The pt has been advised of the MRI instructions, she will call with any concerns. The pt verbalized understanding   You have been scheduled for an MRI at [facility] on 10/15/17. Your appointment time is 12 noon. Please arrive 15 minutes prior to your appointment time for registration purposes. Please make certain not to have anything to eat or drink 6 hours prior to your test. In addition, if you have any metal in your body, have a pacemaker or defibrillator, please be sure to let your ordering physician know. This test typically takes 45 minutes to 1 hour to complete. Should you need to reschedule, please call [phone] to do so.

## 2017-09-04 NOTE — Telephone Encounter (Signed)
Patient states she has changed her mind and would like to have an MRI before she sees Dr.Jacobs's on 4.5.19. Pt requesting call to schedule.

## 2017-09-15 ENCOUNTER — Other Ambulatory Visit: Payer: Self-pay | Admitting: Nurse Practitioner

## 2017-09-15 DIAGNOSIS — M47816 Spondylosis without myelopathy or radiculopathy, lumbar region: Secondary | ICD-10-CM

## 2017-09-15 DIAGNOSIS — M4186 Other forms of scoliosis, lumbar region: Secondary | ICD-10-CM | POA: Diagnosis not present

## 2017-09-15 DIAGNOSIS — M5416 Radiculopathy, lumbar region: Secondary | ICD-10-CM | POA: Diagnosis not present

## 2017-09-18 ENCOUNTER — Ambulatory Visit
Admission: RE | Admit: 2017-09-18 | Discharge: 2017-09-18 | Disposition: A | Discharge status: Home / Self Care | Payer: Medicare HMO | Source: Ambulatory Visit | Attending: Nurse Practitioner | Admitting: Nurse Practitioner

## 2017-09-18 DIAGNOSIS — L84 Corns and callosities: Secondary | ICD-10-CM | POA: Diagnosis not present

## 2017-09-18 DIAGNOSIS — M79671 Pain in right foot: Secondary | ICD-10-CM | POA: Diagnosis not present

## 2017-09-18 DIAGNOSIS — M79672 Pain in left foot: Secondary | ICD-10-CM | POA: Diagnosis not present

## 2017-09-18 DIAGNOSIS — E1151 Type 2 diabetes mellitus with diabetic peripheral angiopathy without gangrene: Secondary | ICD-10-CM | POA: Diagnosis not present

## 2017-09-18 DIAGNOSIS — B351 Tinea unguium: Secondary | ICD-10-CM | POA: Diagnosis not present

## 2017-09-18 DIAGNOSIS — M21961 Unspecified acquired deformity of right lower leg: Secondary | ICD-10-CM | POA: Diagnosis not present

## 2017-09-18 DIAGNOSIS — M4327 Fusion of spine, lumbosacral region: Secondary | ICD-10-CM | POA: Diagnosis not present

## 2017-09-18 DIAGNOSIS — M47816 Spondylosis without myelopathy or radiculopathy, lumbar region: Secondary | ICD-10-CM

## 2017-09-18 DIAGNOSIS — M21962 Unspecified acquired deformity of left lower leg: Secondary | ICD-10-CM | POA: Diagnosis not present

## 2017-09-18 MED ORDER — IOPAMIDOL (ISOVUE-M 200) INJECTION 41%
1.0000 mL | Freq: Once | INTRAMUSCULAR | Status: AC
  Administered 2017-09-18: 1 mL via EPIDURAL

## 2017-09-18 MED ORDER — METHYLPREDNISOLONE ACETATE 40 MG/ML INJ SUSP (RADIOLOG
120.0000 mg | Freq: Once | INTRAMUSCULAR | Status: AC
  Administered 2017-09-18: 120 mg via EPIDURAL

## 2017-09-28 ENCOUNTER — Other Ambulatory Visit: Payer: Self-pay | Admitting: Internal Medicine

## 2017-10-15 ENCOUNTER — Ambulatory Visit (HOSPITAL_COMMUNITY)
Admission: RE | Admit: 2017-10-15 | Discharge: 2017-10-15 | Disposition: A | Discharge status: Home / Self Care | Payer: Medicare HMO | Source: Ambulatory Visit | Attending: Gastroenterology | Admitting: Gastroenterology

## 2017-10-15 DIAGNOSIS — K8020 Calculus of gallbladder without cholecystitis without obstruction: Secondary | ICD-10-CM | POA: Diagnosis not present

## 2017-10-15 DIAGNOSIS — K769 Liver disease, unspecified: Secondary | ICD-10-CM

## 2017-10-15 DIAGNOSIS — K7460 Unspecified cirrhosis of liver: Secondary | ICD-10-CM | POA: Diagnosis not present

## 2017-10-15 DIAGNOSIS — K862 Cyst of pancreas: Secondary | ICD-10-CM | POA: Insufficient documentation

## 2017-10-15 LAB — POCT I-STAT CREATININE: Creatinine, Ser: 1 mg/dL (ref 0.44–1.00)

## 2017-10-15 MED ORDER — GADOBENATE DIMEGLUMINE 529 MG/ML IV SOLN
20.0000 mL | Freq: Once | INTRAVENOUS | Status: AC | PRN
  Administered 2017-10-15: 20 mL via INTRAVENOUS

## 2017-10-16 ENCOUNTER — Ambulatory Visit: Payer: Self-pay

## 2017-10-16 NOTE — Telephone Encounter (Signed)
Pt calling with sx of runny nose and post nasal drip. Denies pain and fever. Home care advice given. Reason for Disposition . [1] Sinus congestion as part of a cold AND [2] present < 10 days  Answer Assessment - Initial Assessment Questions 1. LOCATION: "Where does it hurt?"      No pain 2. ONSET: "When did the sinus pain start?"  (e.g., hours, days)       1 week ago 3. SEVERITY: "How bad is the pain?"   (Scale 1-10; mild, moderate or severe)   - MILD (1-3): doesn't interfere with normal activities    - MODERATE (4-7): interferes with normal activities (e.g., work or school) or awakens from sleep   - SEVERE (8-10): excruciating pain and patient unable to do any normal activities        No pain 4. RECURRENT SYMPTOM: "Have you ever had sinus problems before?" If so, ask: "When was the last time?" and "What happened that time?"      Yes usually once a year this time of year 5. NASAL CONGESTION: "Is the nose blocked?" If so, ask, "Can you open it or must you breathe through the mouth?"     No has been using Flonase 6. NASAL DISCHARGE: "Do you have discharge from your nose?" If so ask, "What color?"     Yes-clear 7. FEVER: "Do you have a fever?" If so, ask: "What is it, how was it measured, and when did it start?"      no 8. OTHER SYMPTOMS: "Do you have any other symptoms?" (e.g., sore throat, cough, earache, difficulty breathing)     Scratchy, cough, no 9. PREGNANCY: "Is there any chance you are pregnant?" "When was your last menstrual period?"     n/a  Protocols used: SINUS PAIN OR CONGESTION-A-AH

## 2017-10-17 ENCOUNTER — Other Ambulatory Visit (INDEPENDENT_AMBULATORY_CARE_PROVIDER_SITE_OTHER): Payer: Medicare HMO

## 2017-10-17 ENCOUNTER — Encounter: Payer: Self-pay | Admitting: Gastroenterology

## 2017-10-17 ENCOUNTER — Ambulatory Visit: Payer: Medicare HMO | Admitting: Gastroenterology

## 2017-10-17 VITALS — BP 126/72 | HR 72 | Ht 64.75 in | Wt 260.2 lb

## 2017-10-17 DIAGNOSIS — R1013 Epigastric pain: Secondary | ICD-10-CM

## 2017-10-17 DIAGNOSIS — Z0100 Encounter for examination of eyes and vision without abnormal findings: Secondary | ICD-10-CM | POA: Diagnosis not present

## 2017-10-17 DIAGNOSIS — R160 Hepatomegaly, not elsewhere classified: Secondary | ICD-10-CM

## 2017-10-17 LAB — CBC WITH DIFFERENTIAL/PLATELET
Basophils Absolute: 0 10*3/uL (ref 0.0–0.1)
Basophils Relative: 0.7 % (ref 0.0–3.0)
EOS ABS: 0.4 10*3/uL (ref 0.0–0.7)
Eosinophils Relative: 7.5 % — ABNORMAL HIGH (ref 0.0–5.0)
HCT: 37.5 % (ref 36.0–46.0)
HEMOGLOBIN: 12.4 g/dL (ref 12.0–15.0)
LYMPHS ABS: 0.9 10*3/uL (ref 0.7–4.0)
Lymphocytes Relative: 20.3 % (ref 12.0–46.0)
MCHC: 33.1 g/dL (ref 30.0–36.0)
MCV: 94.3 fl (ref 78.0–100.0)
Monocytes Absolute: 0.6 10*3/uL (ref 0.1–1.0)
Monocytes Relative: 12.5 % — ABNORMAL HIGH (ref 3.0–12.0)
NEUTROS PCT: 59 % (ref 43.0–77.0)
Neutro Abs: 2.8 10*3/uL (ref 1.4–7.7)
Platelets: 139 10*3/uL — ABNORMAL LOW (ref 150.0–400.0)
RBC: 3.98 Mil/uL (ref 3.87–5.11)
RDW: 14.2 % (ref 11.5–15.5)
WBC: 4.7 10*3/uL (ref 4.0–10.5)

## 2017-10-17 LAB — COMPREHENSIVE METABOLIC PANEL
ALBUMIN: 4 g/dL (ref 3.5–5.2)
ALK PHOS: 102 U/L (ref 39–117)
ALT: 34 U/L (ref 0–35)
AST: 39 U/L — AB (ref 0–37)
BILIRUBIN TOTAL: 0.7 mg/dL (ref 0.2–1.2)
BUN: 24 mg/dL — ABNORMAL HIGH (ref 6–23)
CO2: 33 mEq/L — ABNORMAL HIGH (ref 19–32)
CREATININE: 1.09 mg/dL (ref 0.40–1.20)
Calcium: 10 mg/dL (ref 8.4–10.5)
Chloride: 101 mEq/L (ref 96–112)
GFR: 52.01 mL/min — ABNORMAL LOW (ref >60.00)
Glucose, Bld: 110 mg/dL — ABNORMAL HIGH (ref 70–99)
Potassium: 4.2 mEq/L (ref 3.5–5.1)
Sodium: 140 mEq/L (ref 135–145)
TOTAL PROTEIN: 6.7 g/dL (ref 6.0–8.3)

## 2017-10-17 LAB — PROTIME-INR
INR: 1 ratio (ref 0.8–1.0)
PROTHROMBIN TIME: 10.3 s (ref 9.6–13.1)

## 2017-10-17 NOTE — Patient Instructions (Addendum)
MRI of liver in 6 months (hepatoma screening, follow up 'vascular abnormality' noted on MRI this week. You will have labs checked today in the basement lab.  Please head down after you check out with the front desk  (cbc, cmet, inr). Please return to see Dr. Ardis Hughs in 6-7 months.  Normal BMI (Body Mass Index- based on height and weight) is between 23 and 30. Your BMI today is Body mass index is 43.64 kg/m. Marland Kitchen Please consider follow up  regarding your BMI with your Primary Care Provider.

## 2017-10-17 NOTE — Progress Notes (Signed)
Review of pertinent gastrointestinal problems: 1. Cirrhosis: (Very possibly "burned out" autoimmune hepatitis ) CT abdomen and pelvis done on 03/12/2017 showed a nodular liver consistent with cirrhosis, there is a 9 mm lesion at the right hepatic dome too small to characterize, no biliary dilation, she did have a thick-walled gallbladder with multiple gallstones, no splenomegaly but noted splenic varices.  Laboratory workup for cirrhosis September 2018: ANA was + 1:160 titer; anti-smooth muscle antibody +, hepatitis A total antibody negative, hepatitis B surface antigen negative, hepatitis B surface antibody negative, hepatitis C antibody negative, ceruloplasmin normal, iron studies normal, alpha-1 antitrypsin level normal, AMA negative  Liver lesion on MRI 03/2017; peripherally enhancing T2 hyperintense and T1 hypointense lesion within segment 8 of the liver, measuring 1.8 cm, and indeterminant. (IR was unable biopsy this);  repeat MRI March 2019 showed that the lesion in question had completely disappeared, new issue was raised with "vascular abnormality" and radiology recommended follow-up MRI 3-6 months  AFP 03/2017; 3.0 (normal)  EGD 03/2017; no signs of portal hypertension  Liver biopsy 04/2017: Cirrhosis with mild cholestasis.  Pathology felt possibly this was possibly due to an autoimmune process, 2. Colonoscopy 03/2017 (for slightly elevated CEA and liver lesion on imaging): normal TI, normal colon 3.  Abdominal pain, elevated liver tests, September 2018.  Gallstones in her gallbladder on imaging however pain not completely consistent with biliary process.  EGD showed with deep duodenal bulb ulcer.  Biopsies of her stomach showed no H. Pylori.  Seems likely the ulcer was from unopposed meloxicam.  Her pains improved quickly with proton pump inhibitor, her liver tests normalized as well.   HPI: This is a very pleasant 77 year old woman with cirrhosis, abnormal MRI recently  She has been  feeling quite well overall.  No significant issues with swelling, no signs of encephalopathy.  She is bothered mostly by seasonal allergy symptoms recently.  Chief complaint is cirrhosis  ROS: complete GI ROS as described in HPI, all other review negative.  Constitutional:  No unintentional weight loss  (weight is up +9 pounds since OV 5 months ago).   Past Medical History:  Diagnosis Date  . ALLERGIC RHINITIS 04/29/2007  . Allergy   . Cataract    both eyes  . DIABETES MELLITUS, TYPE II 06/23/2007   type 2  . Elevated liver enzymes 03/2017  . Elevated tumor markers   . GERD (gastroesophageal reflux disease)   . HIATAL HERNIA, HX OF 04/29/2007  . HYPERLIPIDEMIA 04/29/2007   diet controlled on CoQ10  . HYPERTENSION 04/29/2007  . HYPOKALEMIA 05/02/2009  . HYPOTHYROIDISM 04/29/2007  . LOW BACK PAIN 04/29/2007  . Neuromuscular disorder ([place])    diabetic neuropathy in both legs  . OSTEOARTHRITIS, KNEE, LEFT 06/23/2007  . OSTEOPOROSIS NOS 05/12/2007  . SPINAL STENOSIS, LUMBAR 04/29/2007    Past Surgical History:  Procedure Laterality Date  . BACK SURGERY N/A 2014 x 2   T1 TO S 1   . CATARACT EXTRACTION W/ INTRAOCULAR LENS  IMPLANT, BILATERAL Bilateral 05/2011   doing well  . COLONOSCOPY WITH PROPOFOL N/A 04/10/2017   Procedure: COLONOSCOPY WITH PROPOFOL;  Surgeon: [name], MD;  Location: [facility];  Service: Endoscopy;  Laterality: N/A;  . COLONSCOPY  2011  . TOTAL KNEE ARTHROPLASTY  2006   right  . TOTAL KNEE ARTHROPLASTY Left 2012    Current Outpatient Medications  Medication Sig Dispense Refill  . ACCU-CHEK SOFTCLIX LANCETS lancets 1 each by Other route 2 (two) times daily. Use to help  check blood sugars twice a day Dx e11.9 100 each 3  . alendronate (FOSAMAX) 70 MG tablet Take 1 tablet (70 mg total) by mouth once a week. On sunday 12 tablet 3  . aspirin 81 MG EC tablet Take 81 mg by mouth daily.      Marland Kitchen atenolol-chlorthalidone (TENORETIC) 50-25 MG tablet  Take 1 tablet by mouth daily. 90 tablet 3  . Blood Glucose Calibration (ACCU-CHEK COMPACT PLUS CONTROL) SOLN Use as directed Dx E11.9 3 each 3  . Blood Glucose Monitoring Suppl (ACCU-CHEK AVIVA PLUS) w/Device KIT Use as directed to check blood sugars everyday Dx e11.9 1 kit 0  . Calcium Carb-Cholecalciferol (CALCIUM 600+D3) 600-800 MG-UNIT TABS Take 1 tablet by mouth every evening.    . cetirizine (ZYRTEC) 10 MG tablet Take 1 tablet (10 mg total) by mouth daily. (Patient taking differently: Take 10 mg by mouth daily as needed for allergies or rhinitis. ) 90 tablet 3  . Co-Enzyme Q-10 100 MG CAPS Take 100 mg by mouth daily.    . fluticasone (FLONASE) 50 MCG/ACT nasal spray Place 2 sprays into both nostrils daily. 48 g 3  . gabapentin (NEURONTIN) 300 MG capsule 1 tab by mouth three times per day 270 capsule 3  . glimepiride (AMARYL) 4 MG tablet Take 1 tablet (4 mg total) by mouth 2 (two) times daily. 180 tablet 3  . glucose blood test strip Use as directed two times a day, diagnosis code E11.9 200 each 3  . levothyroxine (SYNTHROID, LEVOTHROID) 175 MCG tablet Take 1 tablet (175 mcg total) by mouth daily before breakfast. 90 tablet 3  . meloxicam (MOBIC) 15 MG tablet Take 0.5 tablets (7.5 mg total) by mouth daily. 45 tablet 3  . metFORMIN (GLUCOPHAGE) 1000 MG tablet Take 1 tablet (1,000 mg total) by mouth 2 (two) times daily with a meal. 180 tablet 3  . omeprazole (PRILOSEC) 40 MG capsule Take 1 pill 30 minutes before breakfast and dinner 60 capsule 6  . pioglitazone (ACTOS) 15 MG tablet Take 1 tablet (15 mg total) by mouth daily. 90 tablet 3  . Red Yeast Rice Extract 600 MG CAPS Take 1,200 mg by mouth 2 (two) times daily.     No current facility-administered medications for this visit.     Allergies as of 10/17/2017  . (No Known Allergies)    Family History  Problem Relation Age of Onset  . Coronary artery disease Other        Female 1st degree relative  . Diabetes Other        1st degree  relative  . Colon polyps Other   . Arthritis Mother   . Hypertension Mother   . Coronary artery disease Father   . Arthritis Brother   . Stroke Brother   . Hyperlipidemia Brother   . COPD Brother   . Colon cancer Neg Hx   . Esophageal cancer Neg Hx   . Rectal cancer Neg Hx   . Stomach cancer Neg Hx     Social History   Socioeconomic History  . Marital status: Divorced    Spouse name: Not on file  . Number of children: Not on file  . Years of education: Not on file  . Highest education level: Not on file  Occupational History  . 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 Financial resource strain: Not on file  . Food insecurity:    Worry: Not on file  Inability: Not on file  . Transportation needs:    Medical: Not on file    Non-medical: Not on file  Tobacco Use  . Smoking status: Never Smoker  . Smokeless tobacco: Never Used  Substance and Sexual Activity  . Alcohol use: Yes    Alcohol/week: 0.0 oz    Comment: One or two drinks in month  . Drug use: No  . Sexual activity: Not on file  Lifestyle  . Physical activity:    Days per week: Not on file    Minutes per session: Not on file  . Stress: Not on file  Relationships  . Social connections:    Talks on phone: Not on file    Gets together: Not on file    Attends religious service: Not on file    Active member of club or organization: Not on file    Attends meetings of clubs or organizations: Not on file    Relationship status: Not on file  . Intimate partner violence:    Fear of current or ex partner: Not on file    Emotionally abused: Not on file    Physically abused: Not on file    Forced sexual activity: Not on file  Other Topics Concern  . Not on file  Social History Narrative  . Not on file     Physical Exam: BP 126/72 (BP Location: Left Arm, Patient Position: Sitting, Cuff Size: Normal)   Pulse 72 Comment: irregular  Ht 5' 4.75" (1.645 m)   Wt 260 lb 4 oz (118 kg)    BMI 43.64 kg/m  Constitutional: generally well-appearing Psychiatric: alert and oriented x3 Abdomen: soft, nontender, nondistended, no obvious ascites, no peritoneal signs, normal bowel sounds No peripheral edema noted in lower extremities  Assessment and plan: 77 y.o. female with well compensated cirrhosis  Seems to be doing very well from a liver standpoint.  The previously noted 1.8 cm lesion in her liver has completely resolved by MRI however a new issue was found by radiology.  We will order a repeat MRI 6 months from now to reevaluate the "vascular abnormality" noted by radiology.  She will return to see me shortly after that.  Today we will get basic staging labs of her cirrhosis.  Please see the "Patient Instructions" section for addition details about the plan.  Owens Loffler, MD [facility] 10/17/2017, 9:39 AM

## 2017-11-17 DIAGNOSIS — M5416 Radiculopathy, lumbar region: Secondary | ICD-10-CM | POA: Diagnosis not present

## 2017-11-17 DIAGNOSIS — M461 Sacroiliitis, not elsewhere classified: Secondary | ICD-10-CM | POA: Diagnosis not present

## 2017-11-19 ENCOUNTER — Other Ambulatory Visit: Payer: Self-pay | Admitting: Neurosurgery

## 2017-11-21 ENCOUNTER — Other Ambulatory Visit: Payer: Self-pay | Admitting: Gastroenterology

## 2017-11-21 ENCOUNTER — Other Ambulatory Visit: Payer: Self-pay | Admitting: Neurosurgery

## 2017-11-21 DIAGNOSIS — K2970 Gastritis, unspecified, without bleeding: Secondary | ICD-10-CM

## 2017-11-21 DIAGNOSIS — K269 Duodenal ulcer, unspecified as acute or chronic, without hemorrhage or perforation: Secondary | ICD-10-CM

## 2017-11-21 DIAGNOSIS — M461 Sacroiliitis, not elsewhere classified: Secondary | ICD-10-CM

## 2017-11-21 DIAGNOSIS — K2990 Gastroduodenitis, unspecified, without bleeding: Secondary | ICD-10-CM

## 2017-11-21 DIAGNOSIS — R1013 Epigastric pain: Secondary | ICD-10-CM

## 2017-11-26 ENCOUNTER — Ambulatory Visit
Admission: RE | Admit: 2017-11-26 | Discharge: 2017-11-26 | Disposition: A | Discharge status: Home / Self Care | Payer: Medicare HMO | Source: Ambulatory Visit | Attending: Neurosurgery | Admitting: Neurosurgery

## 2017-11-26 DIAGNOSIS — M533 Sacrococcygeal disorders, not elsewhere classified: Secondary | ICD-10-CM | POA: Diagnosis not present

## 2017-11-26 DIAGNOSIS — M461 Sacroiliitis, not elsewhere classified: Secondary | ICD-10-CM

## 2017-11-26 MED ORDER — IOPAMIDOL (ISOVUE-M 200) INJECTION 41%
1.0000 mL | Freq: Once | INTRAMUSCULAR | Status: AC
  Administered 2017-11-26: 1 mL via INTRA_ARTICULAR

## 2017-11-26 MED ORDER — METHYLPREDNISOLONE ACETATE 40 MG/ML INJ SUSP (RADIOLOG
120.0000 mg | Freq: Once | INTRAMUSCULAR | Status: AC
  Administered 2017-11-26: 120 mg via INTRA_ARTICULAR

## 2017-11-26 NOTE — Discharge Instructions (Signed)

## 2017-12-22 DIAGNOSIS — M461 Sacroiliitis, not elsewhere classified: Secondary | ICD-10-CM | POA: Diagnosis not present

## 2017-12-22 DIAGNOSIS — M47816 Spondylosis without myelopathy or radiculopathy, lumbar region: Secondary | ICD-10-CM | POA: Diagnosis not present

## 2017-12-23 ENCOUNTER — Other Ambulatory Visit: Payer: Self-pay | Admitting: Neurosurgery

## 2017-12-23 DIAGNOSIS — M461 Sacroiliitis, not elsewhere classified: Secondary | ICD-10-CM

## 2018-01-01 ENCOUNTER — Other Ambulatory Visit: Payer: Medicare HMO

## 2018-01-12 ENCOUNTER — Other Ambulatory Visit (INDEPENDENT_AMBULATORY_CARE_PROVIDER_SITE_OTHER): Payer: Medicare HMO

## 2018-01-12 DIAGNOSIS — E118 Type 2 diabetes mellitus with unspecified complications: Secondary | ICD-10-CM | POA: Diagnosis not present

## 2018-01-12 LAB — BASIC METABOLIC PANEL
BUN: 27 mg/dL — AB (ref 6–23)
CO2: 31 meq/L (ref 19–32)
Calcium: 10.1 mg/dL (ref 8.4–10.5)
Chloride: 102 mEq/L (ref 96–112)
Creatinine, Ser: 0.98 mg/dL (ref 0.40–1.20)
GFR: 58.77 mL/min — ABNORMAL LOW (ref >60.00)
Glucose, Bld: 89 mg/dL (ref 70–99)
Potassium: 4 mEq/L (ref 3.5–5.1)
Sodium: 140 mEq/L (ref 135–145)

## 2018-01-12 LAB — HEPATIC FUNCTION PANEL
ALBUMIN: 4 g/dL (ref 3.5–5.2)
ALK PHOS: 66 U/L (ref 39–117)
ALT: 26 U/L (ref 0–35)
AST: 33 U/L (ref 0–37)
Bilirubin, Direct: 0.2 mg/dL (ref 0.0–0.3)
Total Bilirubin: 0.9 mg/dL (ref 0.2–1.2)
Total Protein: 6.5 g/dL (ref 6.0–8.3)

## 2018-01-12 LAB — LIPID PANEL
CHOL/HDL RATIO: 2
Cholesterol: 157 mg/dL (ref 0–200)
HDL: 63.3 mg/dL (ref >39.00)
LDL CALC: 72 mg/dL (ref 0–99)
NONHDL: 93.95
TRIGLYCERIDES: 108 mg/dL (ref 0.0–149.0)
VLDL: 21.6 mg/dL (ref 0.0–40.0)

## 2018-01-12 LAB — HEMOGLOBIN A1C: Hgb A1c MFr Bld: 7.2 % — ABNORMAL HIGH (ref 4.6–6.5)

## 2018-01-20 ENCOUNTER — Encounter: Payer: Self-pay | Admitting: Internal Medicine

## 2018-01-20 ENCOUNTER — Ambulatory Visit (INDEPENDENT_AMBULATORY_CARE_PROVIDER_SITE_OTHER): Payer: Medicare HMO | Admitting: Internal Medicine

## 2018-01-20 VITALS — BP 128/88 | HR 78 | Temp 98.2°F | Ht 64.75 in | Wt 261.0 lb

## 2018-01-20 DIAGNOSIS — E785 Hyperlipidemia, unspecified: Secondary | ICD-10-CM

## 2018-01-20 DIAGNOSIS — N183 Chronic kidney disease, stage 3 unspecified: Secondary | ICD-10-CM | POA: Insufficient documentation

## 2018-01-20 DIAGNOSIS — E118 Type 2 diabetes mellitus with unspecified complications: Secondary | ICD-10-CM

## 2018-01-20 DIAGNOSIS — I10 Essential (primary) hypertension: Secondary | ICD-10-CM

## 2018-01-20 MED ORDER — MELOXICAM 7.5 MG PO TABS: 7.5000 mg | ORAL_TABLET | Freq: Every day | ORAL | 3 refills | Status: DC | PRN
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
## 2018-01-20 NOTE — Assessment & Plan Note (Signed)
Recent development confirmed on 2 lab tests; to avoid increase nsaids or nephrotoxics

## 2018-01-20 NOTE — Patient Instructions (Signed)
Please continue all other medications as before, and refills have been done if requested.  Please have the pharmacy call with any other refills you may need.  Please continue your efforts at being more active, low cholesterol diet, and weight control.  You are otherwise up to date with prevention measures today.  Please keep your appointments with your specialists as you may have planned  Please return in 6 months, or sooner if needed, with Lab testing done 3-5 days before  

## 2018-01-20 NOTE — Assessment & Plan Note (Signed)
stable overall by history and exam, recent data reviewed with pt, and pt to continue medical treatment as before,  to f/u any worsening symptoms or concerns  

## 2018-01-20 NOTE — Assessment & Plan Note (Signed)
stable overall by history and exam, recent data reviewed with pt, and pt to continue medical treatment as before,  to f/u any worsening symptoms or concerns Lab Results  Component Value Date   HGBA1C 7.2 (H) 01/12/2018

## 2018-02-09 ENCOUNTER — Telehealth: Payer: Self-pay | Admitting: Internal Medicine

## 2018-02-09 MED ORDER — MECLIZINE HCL 12.5 MG PO TABS: 12.5000 mg | ORAL_TABLET | Freq: Three times a day (TID) | ORAL | 1 refills | Status: DC | PRN

## 2018-02-09 NOTE — Telephone Encounter (Signed)
Ok for trial meclizine prn - done erx  Needs OV if not improved or ED if gets worse

## 2018-02-09 NOTE — Telephone Encounter (Signed)
Copied from [place] [id number]. Topic: Quick Communication - See Telephone Encounter >> Feb 09, 2018 11:44 AM Percell Belt A wrote: CRM for notification. See Telephone encounter for: 02/09/18. Pt called in and stated she woke up with vertigo.  She would like to know what Dr Jenny Reichmann would suggest her to take.  She stated that she did not want to fall.  She wanted to see if there is anything he would suggest without her having to come in?  She stated she had it about a year ago.    Pharmacy -[facility] on [address]

## 2018-02-09 NOTE — Telephone Encounter (Signed)
Pt has been informed and expressed understanding.  

## 2018-02-10 ENCOUNTER — Ambulatory Visit
Admission: RE | Admit: 2018-02-10 | Discharge: 2018-02-10 | Disposition: A | Discharge status: Home / Self Care | Payer: Medicare HMO | Source: Ambulatory Visit | Attending: Neurosurgery | Admitting: Neurosurgery

## 2018-02-10 DIAGNOSIS — M461 Sacroiliitis, not elsewhere classified: Secondary | ICD-10-CM

## 2018-02-10 DIAGNOSIS — M533 Sacrococcygeal disorders, not elsewhere classified: Secondary | ICD-10-CM | POA: Diagnosis not present

## 2018-02-10 MED ORDER — METHYLPREDNISOLONE ACETATE 40 MG/ML INJ SUSP (RADIOLOG
120.0000 mg | Freq: Once | INTRAMUSCULAR | Status: AC
  Administered 2018-02-10: 120 mg via INTRA_ARTICULAR

## 2018-02-10 MED ORDER — IOPAMIDOL (ISOVUE-M 200) INJECTION 41%
1.0000 mL | Freq: Once | INTRAMUSCULAR | Status: AC
  Administered 2018-02-10: 1 mL via INTRA_ARTICULAR

## 2018-03-12 ENCOUNTER — Other Ambulatory Visit: Payer: Self-pay | Admitting: Gastroenterology

## 2018-03-12 DIAGNOSIS — K269 Duodenal ulcer, unspecified as acute or chronic, without hemorrhage or perforation: Secondary | ICD-10-CM

## 2018-03-12 DIAGNOSIS — R1013 Epigastric pain: Secondary | ICD-10-CM

## 2018-03-12 DIAGNOSIS — K2970 Gastritis, unspecified, without bleeding: Secondary | ICD-10-CM

## 2018-03-12 DIAGNOSIS — K2990 Gastroduodenitis, unspecified, without bleeding: Secondary | ICD-10-CM

## 2018-03-17 ENCOUNTER — Other Ambulatory Visit: Payer: Self-pay | Admitting: Gastroenterology

## 2018-03-17 ENCOUNTER — Encounter: Payer: Self-pay | Admitting: Gastroenterology

## 2018-03-17 ENCOUNTER — Other Ambulatory Visit: Payer: Self-pay | Admitting: Internal Medicine

## 2018-03-17 DIAGNOSIS — Z1231 Encounter for screening mammogram for malignant neoplasm of breast: Secondary | ICD-10-CM

## 2018-03-17 DIAGNOSIS — I999 Unspecified disorder of circulatory system: Secondary | ICD-10-CM

## 2018-03-17 DIAGNOSIS — R1013 Epigastric pain: Secondary | ICD-10-CM

## 2018-03-24 ENCOUNTER — Other Ambulatory Visit: Payer: Self-pay | Admitting: Dermatology

## 2018-03-24 DIAGNOSIS — D229 Melanocytic nevi, unspecified: Secondary | ICD-10-CM | POA: Diagnosis not present

## 2018-03-24 DIAGNOSIS — C4491 Basal cell carcinoma of skin, unspecified: Secondary | ICD-10-CM

## 2018-03-24 DIAGNOSIS — L821 Other seborrheic keratosis: Secondary | ICD-10-CM | POA: Diagnosis not present

## 2018-03-24 DIAGNOSIS — C44311 Basal cell carcinoma of skin of nose: Secondary | ICD-10-CM | POA: Diagnosis not present

## 2018-03-24 HISTORY — DX: Basal cell carcinoma of skin, unspecified: C44.91

## 2018-04-03 ENCOUNTER — Ambulatory Visit: Payer: Medicare HMO | Admitting: Gastroenterology

## 2018-04-15 ENCOUNTER — Other Ambulatory Visit: Payer: Self-pay | Admitting: Internal Medicine

## 2018-04-15 DIAGNOSIS — R69 Illness, unspecified: Secondary | ICD-10-CM | POA: Diagnosis not present

## 2018-04-15 NOTE — Telephone Encounter (Signed)
Done erx 

## 2018-04-17 ENCOUNTER — Ambulatory Visit
Admission: RE | Admit: 2018-04-17 | Discharge: 2018-04-17 | Disposition: A | Discharge status: Home / Self Care | Payer: Medicare HMO | Source: Ambulatory Visit | Attending: Internal Medicine | Admitting: Internal Medicine

## 2018-04-17 DIAGNOSIS — Z1231 Encounter for screening mammogram for malignant neoplasm of breast: Secondary | ICD-10-CM

## 2018-05-01 ENCOUNTER — Ambulatory Visit (HOSPITAL_COMMUNITY)
Admission: RE | Admit: 2018-05-01 | Discharge: 2018-05-01 | Disposition: A | Discharge status: Home / Self Care | Payer: Medicare HMO | Source: Ambulatory Visit | Attending: Gastroenterology | Admitting: Gastroenterology

## 2018-05-01 DIAGNOSIS — K8020 Calculus of gallbladder without cholecystitis without obstruction: Secondary | ICD-10-CM | POA: Insufficient documentation

## 2018-05-01 DIAGNOSIS — K862 Cyst of pancreas: Secondary | ICD-10-CM | POA: Diagnosis not present

## 2018-05-01 DIAGNOSIS — K766 Portal hypertension: Secondary | ICD-10-CM | POA: Insufficient documentation

## 2018-05-01 DIAGNOSIS — I999 Unspecified disorder of circulatory system: Secondary | ICD-10-CM

## 2018-05-01 DIAGNOSIS — K7460 Unspecified cirrhosis of liver: Secondary | ICD-10-CM | POA: Insufficient documentation

## 2018-05-01 DIAGNOSIS — K7689 Other specified diseases of liver: Secondary | ICD-10-CM | POA: Diagnosis not present

## 2018-05-01 DIAGNOSIS — K869 Disease of pancreas, unspecified: Secondary | ICD-10-CM | POA: Diagnosis not present

## 2018-05-01 MED ORDER — GADOBUTROL 1 MMOL/ML IV SOLN
10.0000 mL | Freq: Once | INTRAVENOUS | Status: AC | PRN
  Administered 2018-05-01: 10 mL via INTRAVENOUS

## 2018-05-04 LAB — POCT I-STAT CREATININE: Creatinine, Ser: 1 mg/dL (ref 0.44–1.00)

## 2018-05-13 DIAGNOSIS — M47816 Spondylosis without myelopathy or radiculopathy, lumbar region: Secondary | ICD-10-CM | POA: Diagnosis not present

## 2018-05-13 DIAGNOSIS — M461 Sacroiliitis, not elsewhere classified: Secondary | ICD-10-CM | POA: Diagnosis not present

## 2018-05-13 DIAGNOSIS — Z981 Arthrodesis status: Secondary | ICD-10-CM | POA: Diagnosis not present

## 2018-05-13 DIAGNOSIS — M545 Low back pain: Secondary | ICD-10-CM | POA: Diagnosis not present

## 2018-05-15 ENCOUNTER — Other Ambulatory Visit: Payer: Self-pay | Admitting: Neurosurgery

## 2018-05-15 ENCOUNTER — Encounter: Payer: Self-pay | Admitting: Gastroenterology

## 2018-05-15 ENCOUNTER — Ambulatory Visit: Payer: Medicare HMO | Admitting: Gastroenterology

## 2018-05-15 ENCOUNTER — Other Ambulatory Visit (INDEPENDENT_AMBULATORY_CARE_PROVIDER_SITE_OTHER): Payer: Medicare HMO

## 2018-05-15 VITALS — BP 116/70 | HR 74 | Ht 64.0 in | Wt 246.4 lb

## 2018-05-15 DIAGNOSIS — K7460 Unspecified cirrhosis of liver: Secondary | ICD-10-CM

## 2018-05-15 DIAGNOSIS — M461 Sacroiliitis, not elsewhere classified: Secondary | ICD-10-CM

## 2018-05-15 LAB — COMPREHENSIVE METABOLIC PANEL
ALT: 22 U/L (ref 0–35)
AST: 34 U/L (ref 0–37)
Albumin: 4.2 g/dL (ref 3.5–5.2)
Alkaline Phosphatase: 64 U/L (ref 39–117)
BUN: 32 mg/dL — ABNORMAL HIGH (ref 6–23)
CALCIUM: 10.4 mg/dL (ref 8.4–10.5)
CO2: 27 meq/L (ref 19–32)
CREATININE: 1.3 mg/dL — AB (ref 0.40–1.20)
Chloride: 98 mEq/L (ref 96–112)
GFR: 42.38 mL/min — ABNORMAL LOW (ref >60.00)
Glucose, Bld: 148 mg/dL — ABNORMAL HIGH (ref 70–99)
POTASSIUM: 4.2 meq/L (ref 3.5–5.1)
Sodium: 137 mEq/L (ref 135–145)
Total Bilirubin: 0.9 mg/dL (ref 0.2–1.2)
Total Protein: 6.8 g/dL (ref 6.0–8.3)

## 2018-05-15 LAB — PROTIME-INR
INR: 1.1 ratio — AB (ref 0.8–1.0)
PROTHROMBIN TIME: 12.7 s (ref 9.6–13.1)

## 2018-05-15 LAB — CBC WITH DIFFERENTIAL/PLATELET
BASOS ABS: 0 10*3/uL (ref 0.0–0.1)
Basophils Relative: 0.8 % (ref 0.0–3.0)
EOS PCT: 4.6 % (ref 0.0–5.0)
Eosinophils Absolute: 0.2 10*3/uL (ref 0.0–0.7)
HEMATOCRIT: 39.7 % (ref 36.0–46.0)
HEMOGLOBIN: 13.4 g/dL (ref 12.0–15.0)
LYMPHS ABS: 1.1 10*3/uL (ref 0.7–4.0)
LYMPHS PCT: 22.8 % (ref 12.0–46.0)
MCHC: 33.7 g/dL (ref 30.0–36.0)
MCV: 93.8 fl (ref 78.0–100.0)
MONOS PCT: 9.4 % (ref 3.0–12.0)
Monocytes Absolute: 0.5 10*3/uL (ref 0.1–1.0)
Neutro Abs: 3.1 10*3/uL (ref 1.4–7.7)
Neutrophils Relative %: 62.4 % (ref 43.0–77.0)
Platelets: 163 10*3/uL (ref 150.0–400.0)
RBC: 4.24 Mil/uL (ref 3.87–5.11)
RDW: 13.9 % (ref 11.5–15.5)
WBC: 5 10*3/uL (ref 4.0–10.5)

## 2018-05-15 NOTE — Progress Notes (Signed)
Review of pertinent gastrointestinal problems: 1.Cirrhosis:(Very possibly "burned out" autoimmune hepatitis)CT abdomen and pelvis done on 03/12/2017 showed a nodular liver consistent with cirrhosis, there is a 9 mm lesion at the right hepatic dome too small to characterize, no biliary dilation, she did have a thick-walled gallbladder with multiple gallstones, no splenomegaly but noted splenic varices.  Laboratory workup for cirrhosis September 2018:ANA was +1:160 titer;anti-smooth muscle antibody +, hepatitis A total antibody negative, hepatitis B surface antigen negative, hepatitis B surface antibody negative, hepatitis C antibody negative, ceruloplasmin normal, iron studies normal, alpha-1 antitrypsin level normal, AMAnegative  Liver lesion on MRI 03/2017;peripherally enhancing T2 hyperintense and T1 hypointense lesion within segment 8 of the liver, measuring 1.8 cm, and indeterminant.(IR was unable biopsy this); repeat MRI March 2019 showed that the lesion in question had completely disappeared, new issue was raised with "vascular abnormality" and radiology recommended follow-up MRI 3-6 months.  MRI 04/2018: Cirrhosis with "stable appearance of vascular shunt in the anterior right lobe.  No evidence of hepatic neoplasm."  AFP 03/2017; 3.0 (normal)  EGD9/2018;no signs of portal hypertension  Liver biopsy 04/2017:Cirrhosis with mild cholestasis. Pathology felt possibly this was possiblydue to an autoimmune process, 2. Colonoscopy 03/2017 (for slightly elevated CEA and liver lesion on imaging): normal TI, normal colon 3.Abdominal pain, elevated liver tests, September 2018. Gallstones in her gallbladder on imaging however pain not completely consistent with biliary process. EGD showed with deep duodenal bulb ulcer. Biopsies of her stomach showed no H. Pylori. Seems likely the ulcer was from unopposed meloxicam. Her pains improved quickly with proton pump inhibitor, her liver tests  normalized as well. 4.  Pancreatic cystic lesion, body of pancreas.  MRI 04/2018 "stable 8 mm cystic lesion in the pancreatic body" no concerning morphologic features such as dilated main pancreatic duct or enhancing lesions.   HPI: This is a very pleasant 77 year old woman whom I last saw several months ago.  Chief complaint is cirrhosis  Clinically she is doing very well.  She has no signs of cirrhosis related swelling, encephalopathy, certainly no overt GI bleeding.  Her biggest complaint is left sided sciatic pain.  ROS: complete GI ROS as described in HPI, all other review negative.  Constitutional:  No unintentional weight loss   Past Medical History:  Diagnosis Date  . ALLERGIC RHINITIS 04/29/2007  . Allergy   . Cataract    both eyes  . DIABETES MELLITUS, TYPE II 06/23/2007   type 2  . Elevated liver enzymes 03/2017  . Elevated tumor markers   . GERD (gastroesophageal reflux disease)   . HIATAL HERNIA, HX OF 04/29/2007  . HYPERLIPIDEMIA 04/29/2007   diet controlled on CoQ10  . HYPERTENSION 04/29/2007  . HYPOKALEMIA 05/02/2009  . HYPOTHYROIDISM 04/29/2007  . LOW BACK PAIN 04/29/2007  . Neuromuscular disorder ([place])    diabetic neuropathy in both legs  . OSTEOARTHRITIS, KNEE, LEFT 06/23/2007  . OSTEOPOROSIS NOS 05/12/2007  . SPINAL STENOSIS, LUMBAR 04/29/2007    Past Surgical History:  Procedure Laterality Date  . BACK SURGERY N/A 2014 x 2   T1 TO S 1   . CATARACT EXTRACTION W/ INTRAOCULAR LENS  IMPLANT, BILATERAL Bilateral 05/2011   doing well  . COLONOSCOPY WITH PROPOFOL N/A 04/10/2017   Procedure: COLONOSCOPY WITH PROPOFOL;  Surgeon: [name], MD;  Location: [facility];  Service: Endoscopy;  Laterality: N/A;  . COLONSCOPY  2011  . TOTAL KNEE ARTHROPLASTY  2006   right  . TOTAL KNEE ARTHROPLASTY Left 2012    Current  Outpatient Medications  Medication Sig Dispense Refill  . ACCU-CHEK SOFTCLIX LANCETS lancets 1 each by Other route 2 (two) times  daily. Use to help check blood sugars twice a day Dx e11.9 100 each 3  . alendronate (FOSAMAX) 70 MG tablet Take 1 tablet (70 mg total) by mouth once a week. On sunday 12 tablet 3  . aspirin 81 MG EC tablet Take 81 mg by mouth daily.      Marland Kitchen atenolol-chlorthalidone (TENORETIC) 50-25 MG tablet Take 1 tablet by mouth daily. 90 tablet 3  . Blood Glucose Calibration (ACCU-CHEK COMPACT PLUS CONTROL) SOLN Use as directed Dx E11.9 3 each 3  . Blood Glucose Monitoring Suppl (ACCU-CHEK AVIVA PLUS) w/Device KIT Use as directed to check blood sugars everyday Dx e11.9 1 kit 0  . Calcium Carb-Cholecalciferol (CALCIUM 600+D3) 600-800 MG-UNIT TABS Take 1 tablet by mouth every evening.    . cetirizine (ZYRTEC) 10 MG tablet Take 1 tablet (10 mg total) by mouth daily. (Patient taking differently: Take 10 mg by mouth daily as needed for allergies or rhinitis. ) 90 tablet 3  . Co-Enzyme Q-10 100 MG CAPS Take 100 mg by mouth daily.    . fluticasone (FLONASE) 50 MCG/ACT nasal spray Place 2 sprays into both nostrils daily. 48 g 3  . gabapentin (NEURONTIN) 300 MG capsule 1 tab by mouth three times per day 270 capsule 3  . glimepiride (AMARYL) 4 MG tablet Take 1 tablet (4 mg total) by mouth 2 (two) times daily. 180 tablet 3  . glucose blood test strip Use as directed two times a day, diagnosis code E11.9 200 each 3  . HYDROcodone-acetaminophen (NORCO/VICODIN) 5-325 MG tablet Take 1 tablet by mouth every 6 (six) hours.    Marland Kitchen levothyroxine (SYNTHROID, LEVOTHROID) 175 MCG tablet Take 1 tablet (175 mcg total) by mouth daily before breakfast. 90 tablet 3  . meloxicam (MOBIC) 7.5 MG tablet Take 1 tablet (7.5 mg total) by mouth daily as needed for pain. 90 tablet 3  . metFORMIN (GLUCOPHAGE) 1000 MG tablet Take 1 tablet (1,000 mg total) by mouth 2 (two) times daily with a meal. 180 tablet 3  . omeprazole (PRILOSEC) 40 MG capsule TAKE ONE CAPSULE BY MOUTH 30 MINUTES BEFORE BREAKFAST AND DINNER 180 capsule 3  . pioglitazone (ACTOS)  15 MG tablet Take 1 tablet (15 mg total) by mouth daily. 90 tablet 3  . Red Yeast Rice Extract 600 MG CAPS Take 1,200 mg by mouth 2 (two) times daily.     No current facility-administered medications for this visit.     Allergies as of 05/15/2018  . (No Known Allergies)    Family History  Problem Relation Age of Onset  . Coronary artery disease Other        Female 1st degree relative  . Diabetes Other        1st degree relative  . Colon polyps Other   . Arthritis Mother   . Hypertension Mother   . Coronary artery disease Father   . Arthritis Brother   . Stroke Brother   . Hyperlipidemia Brother   . COPD Brother   . Colon cancer Neg Hx   . Esophageal cancer Neg Hx   . Rectal cancer Neg Hx   . Stomach cancer Neg Hx     Social History   Socioeconomic History  . Marital status: Divorced    Spouse name: Not on file  . Number of children: Not on file  . Years of education: Not  on file  . Highest education level: Not on file  Occupational History  . 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 Financial resource strain: Not on file  . Food insecurity:    Worry: Not on file    Inability: Not on file  . Transportation needs:    Medical: Not on file    Non-medical: Not on file  Tobacco Use  . Smoking status: Never Smoker  . Smokeless tobacco: Never Used  Substance and Sexual Activity  . Alcohol use: Yes    Alcohol/week: 0.0 standard drinks    Comment: One or two drinks in month  . Drug use: No  . Sexual activity: Not on file  Lifestyle  . Physical activity:    Days per week: Not on file    Minutes per session: Not on file  . Stress: Not on file  Relationships  . Social connections:    Talks on phone: Not on file    Gets together: Not on file    Attends religious service: Not on file    Active member of club or organization: Not on file    Attends meetings of clubs or organizations: Not on file    Relationship status: Not on file  .  Intimate partner violence:    Fear of current or ex partner: Not on file    Emotionally abused: Not on file    Physically abused: Not on file    Forced sexual activity: Not on file  Other Topics Concern  . Not on file  Social History Narrative  . Not on file     Physical Exam: BP 116/70   Pulse 74   Ht _0  (1.626 m)   Wt 246 lb 6 oz (111.8 kg)   BMI 42.29 kg/m  Constitutional: generally well-appearing Psychiatric: alert and oriented x3 Abdomen: soft, nontender, nondistended, no obvious ascites, no peritoneal signs, normal bowel sounds No peripheral edema noted in lower extremities  Assessment and plan: 77 y.o. female with well compensated cirrhosis  Likely due to "burned-out autoimmune hepatitis".  She looks great clinically.  I have no further concerned about the "vascular abnormality" noted in her liver on MRI.  She does not need any more frequent imaging other than is standard for hepatoma screening with ultrasounds every 6 months.  Also alpha-fetoprotein every 6 months.  I will restage her liver disease today with basic set of labs including CBC, complete med about profile, coags and alpha-fetoprotein.  She will return to see me in 12 months and sooner if needed.  She will likely need hepatoma screening again in 6 months and I do not think she will need to return to the office for that alone.  Incidental very innocent appearing pancreatic cystic lesion.  I will repeat imaging by MRI in about 2 years from now pending how she is doing clinically.  Please see the "Patient Instructions" section for addition details about the plan.  Owens Loffler, MD [facility] 05/15/2018, 2:46 PM

## 2018-05-15 NOTE — Patient Instructions (Addendum)
You will have labs checked today in the basement lab.  Please head down after you check out with the front desk  (cmet, inr, cbc, AFP).  Please return to see Dr. Ardis Hughs in 12 months, sooner if any issues.  Your provider has requested that you go to the basement level for lab work before leaving today. Press "B" on the elevator. The lab is located at the first door on the left as you exit the elevator.  Thank you for entrusting me with your care and choosing [facility].  Dr Ardis Hughs

## 2018-05-18 LAB — AFP TUMOR MARKER: AFP-Tumor Marker: 2.8 ng/mL

## 2018-05-19 ENCOUNTER — Telehealth: Payer: Self-pay | Admitting: Gastroenterology

## 2018-05-19 ENCOUNTER — Other Ambulatory Visit: Payer: Self-pay

## 2018-05-19 DIAGNOSIS — K769 Liver disease, unspecified: Secondary | ICD-10-CM

## 2018-05-19 DIAGNOSIS — R978 Other abnormal tumor markers: Secondary | ICD-10-CM

## 2018-05-19 DIAGNOSIS — R7989 Other specified abnormal findings of blood chemistry: Secondary | ICD-10-CM

## 2018-05-19 DIAGNOSIS — R945 Abnormal results of liver function studies: Secondary | ICD-10-CM

## 2018-05-19 DIAGNOSIS — K2970 Gastritis, unspecified, without bleeding: Secondary | ICD-10-CM

## 2018-05-19 DIAGNOSIS — K7460 Unspecified cirrhosis of liver: Secondary | ICD-10-CM

## 2018-05-19 DIAGNOSIS — K2990 Gastroduodenitis, unspecified, without bleeding: Secondary | ICD-10-CM

## 2018-05-19 NOTE — Telephone Encounter (Signed)
Pt called stating that she was returning your call, pls call her again.  °

## 2018-05-19 NOTE — Telephone Encounter (Signed)
The pt has been advised that she does not need an appt for labs. The order is in [place]

## 2018-05-20 ENCOUNTER — Other Ambulatory Visit: Payer: Self-pay | Admitting: Neurosurgery

## 2018-05-20 DIAGNOSIS — M461 Sacroiliitis, not elsewhere classified: Secondary | ICD-10-CM

## 2018-05-20 DIAGNOSIS — M47816 Spondylosis without myelopathy or radiculopathy, lumbar region: Secondary | ICD-10-CM

## 2018-05-21 ENCOUNTER — Other Ambulatory Visit: Payer: Self-pay | Admitting: Neurosurgery

## 2018-05-21 DIAGNOSIS — M533 Sacrococcygeal disorders, not elsewhere classified: Secondary | ICD-10-CM

## 2018-05-22 ENCOUNTER — Other Ambulatory Visit: Payer: Medicare HMO

## 2018-05-22 ENCOUNTER — Ambulatory Visit
Admission: RE | Admit: 2018-05-22 | Discharge: 2018-05-22 | Disposition: A | Discharge status: Home / Self Care | Payer: Medicare HMO | Source: Ambulatory Visit | Attending: Neurosurgery | Admitting: Neurosurgery

## 2018-05-22 DIAGNOSIS — M533 Sacrococcygeal disorders, not elsewhere classified: Secondary | ICD-10-CM | POA: Diagnosis not present

## 2018-05-22 DIAGNOSIS — M461 Sacroiliitis, not elsewhere classified: Secondary | ICD-10-CM

## 2018-05-22 MED ORDER — IOPAMIDOL (ISOVUE-M 200) INJECTION 41%
1.0000 mL | Freq: Once | INTRAMUSCULAR | Status: AC
  Administered 2018-05-22: 1 mL via INTRA_ARTICULAR

## 2018-05-22 MED ORDER — METHYLPREDNISOLONE ACETATE 40 MG/ML INJ SUSP (RADIOLOG
120.0000 mg | Freq: Once | INTRAMUSCULAR | Status: AC
  Administered 2018-05-22: 120 mg via INTRA_ARTICULAR

## 2018-05-25 ENCOUNTER — Ambulatory Visit
Admission: RE | Admit: 2018-05-25 | Discharge: 2018-05-25 | Disposition: A | Discharge status: Home / Self Care | Payer: Medicare HMO | Source: Ambulatory Visit | Attending: Neurosurgery | Admitting: Neurosurgery

## 2018-05-25 DIAGNOSIS — M533 Sacrococcygeal disorders, not elsewhere classified: Secondary | ICD-10-CM

## 2018-05-25 DIAGNOSIS — M47816 Spondylosis without myelopathy or radiculopathy, lumbar region: Secondary | ICD-10-CM

## 2018-05-25 DIAGNOSIS — M461 Sacroiliitis, not elsewhere classified: Secondary | ICD-10-CM

## 2018-05-25 DIAGNOSIS — M48061 Spinal stenosis, lumbar region without neurogenic claudication: Secondary | ICD-10-CM | POA: Diagnosis not present

## 2018-05-27 ENCOUNTER — Other Ambulatory Visit (INDEPENDENT_AMBULATORY_CARE_PROVIDER_SITE_OTHER): Payer: Medicare HMO

## 2018-05-27 ENCOUNTER — Other Ambulatory Visit: Payer: Medicare HMO

## 2018-05-27 DIAGNOSIS — R7989 Other specified abnormal findings of blood chemistry: Secondary | ICD-10-CM

## 2018-05-27 DIAGNOSIS — K2990 Gastroduodenitis, unspecified, without bleeding: Secondary | ICD-10-CM

## 2018-05-27 DIAGNOSIS — R945 Abnormal results of liver function studies: Secondary | ICD-10-CM

## 2018-05-27 DIAGNOSIS — R978 Other abnormal tumor markers: Secondary | ICD-10-CM | POA: Diagnosis not present

## 2018-05-27 DIAGNOSIS — K2970 Gastritis, unspecified, without bleeding: Secondary | ICD-10-CM | POA: Diagnosis not present

## 2018-05-27 DIAGNOSIS — K769 Liver disease, unspecified: Secondary | ICD-10-CM

## 2018-05-27 DIAGNOSIS — K7460 Unspecified cirrhosis of liver: Secondary | ICD-10-CM

## 2018-05-27 LAB — CREATININE, SERUM: CREATININE: 1.06 mg/dL (ref 0.40–1.20)

## 2018-05-29 ENCOUNTER — Telehealth: Payer: Self-pay | Admitting: Gastroenterology

## 2018-06-01 NOTE — Telephone Encounter (Signed)
The pt aware that she is due for follow up in 6 months.  She will be called at that time.

## 2018-06-03 DIAGNOSIS — M5416 Radiculopathy, lumbar region: Secondary | ICD-10-CM | POA: Diagnosis not present

## 2018-06-03 DIAGNOSIS — M461 Sacroiliitis, not elsewhere classified: Secondary | ICD-10-CM | POA: Diagnosis not present

## 2018-06-03 DIAGNOSIS — M47816 Spondylosis without myelopathy or radiculopathy, lumbar region: Secondary | ICD-10-CM | POA: Diagnosis not present

## 2018-06-05 DIAGNOSIS — C44311 Basal cell carcinoma of skin of nose: Secondary | ICD-10-CM | POA: Diagnosis not present

## 2018-06-13 ENCOUNTER — Other Ambulatory Visit: Payer: Self-pay | Admitting: Internal Medicine

## 2018-06-16 DIAGNOSIS — M533 Sacrococcygeal disorders, not elsewhere classified: Secondary | ICD-10-CM | POA: Diagnosis not present

## 2018-06-16 DIAGNOSIS — G894 Chronic pain syndrome: Secondary | ICD-10-CM | POA: Diagnosis not present

## 2018-07-06 DIAGNOSIS — M533 Sacrococcygeal disorders, not elsewhere classified: Secondary | ICD-10-CM | POA: Diagnosis not present

## 2018-07-06 DIAGNOSIS — M47816 Spondylosis without myelopathy or radiculopathy, lumbar region: Secondary | ICD-10-CM | POA: Diagnosis not present

## 2018-07-12 IMAGING — CT CT ABD-PELV W/ CM
2 of 5 series · 16 of 46 positions shown, 18 images · IV contrast (iopamidol)
Comparison: CT lumbar spine 09/08/2015, gallbladder ultrasound
report from 08/15/1999 and lumbar radiographs from 12/19/2015.

CLINICAL DATA: Three weeks of epigastric pain

EXAM:
CT ABDOMEN AND PELVIS WITH CONTRAST
TECHNIQUE: Multidetector CT imaging of the abdomen and pelvis was performed
using the standard protocol following bolus administration of
intravenous contrast.
CONTRAST:  100mL [id number] IOPAMIDOL ([id number]) INJECTION 61%

[Series 2: abd/pel w · axial · 0.87mm/px · z∈[-454,-59]mm · 13 of 89 slices shown, 15 images]
[im 5/89  soft-tissue]
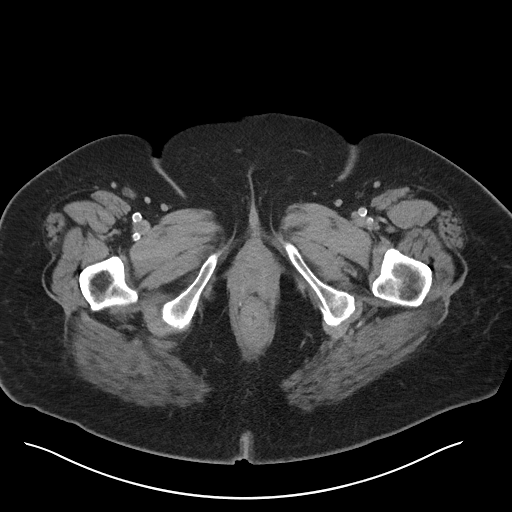
[im 5/89  bone]
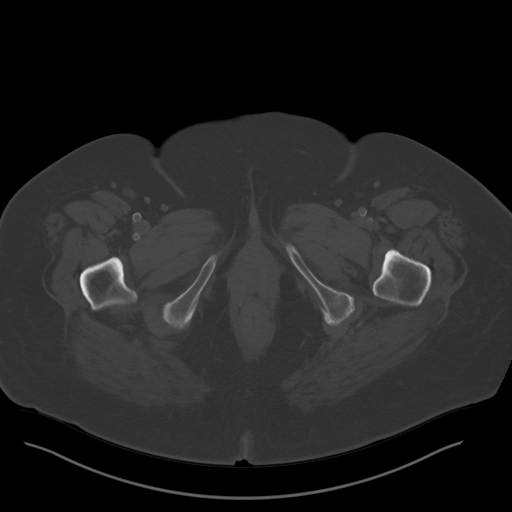
[im 14/89  soft-tissue]
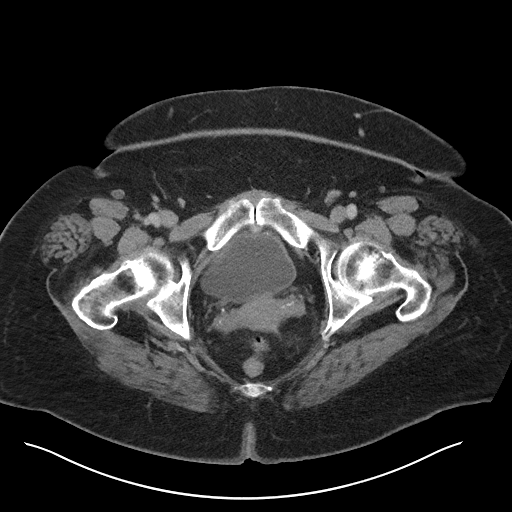
[im 19/89  soft-tissue]
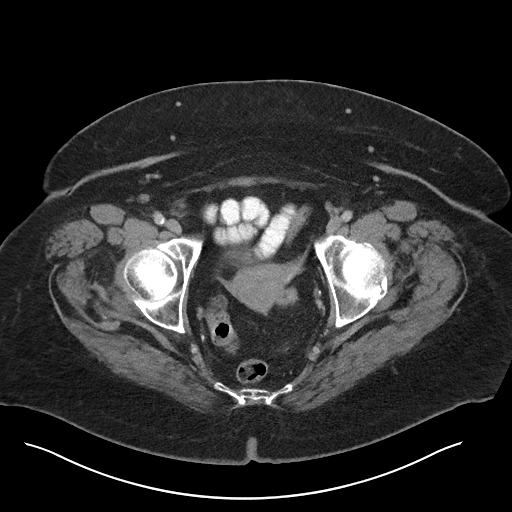
[im 24/89  soft-tissue]
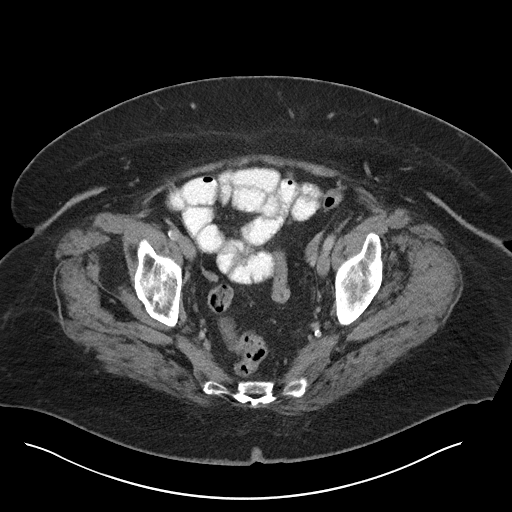
[im 33/89  soft-tissue]
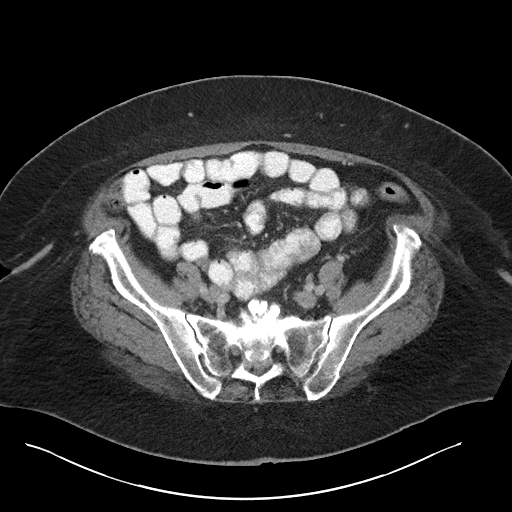
[im 38/89  soft-tissue]
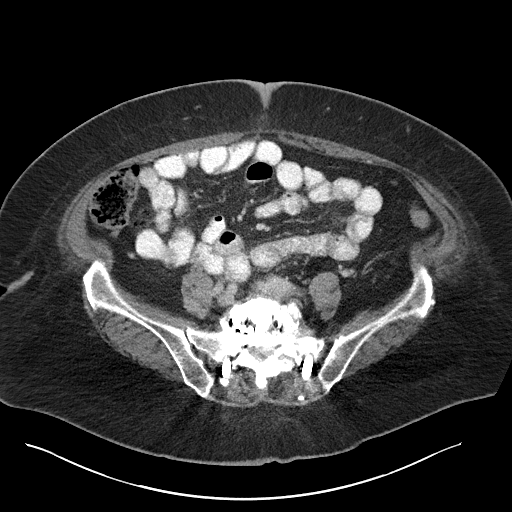
[im 47/89  soft-tissue]
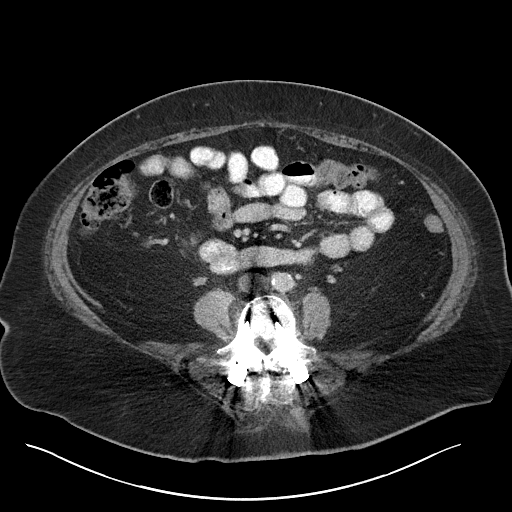
[im 51/89  soft-tissue]
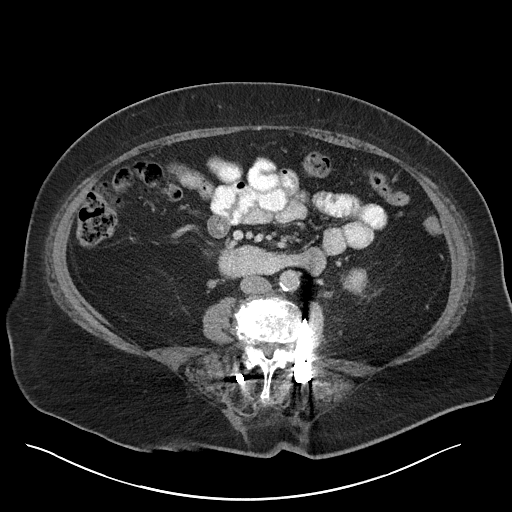
[im 56/89  soft-tissue]
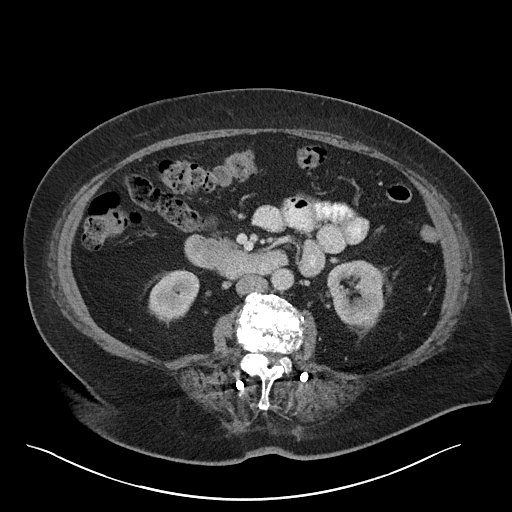
[im 56/89  bone]
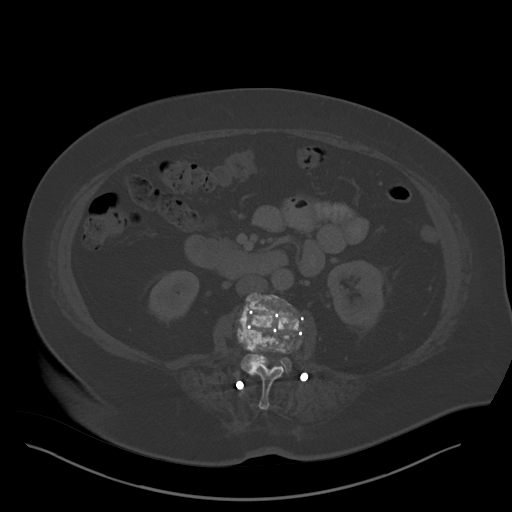
[im 65/89  soft-tissue]
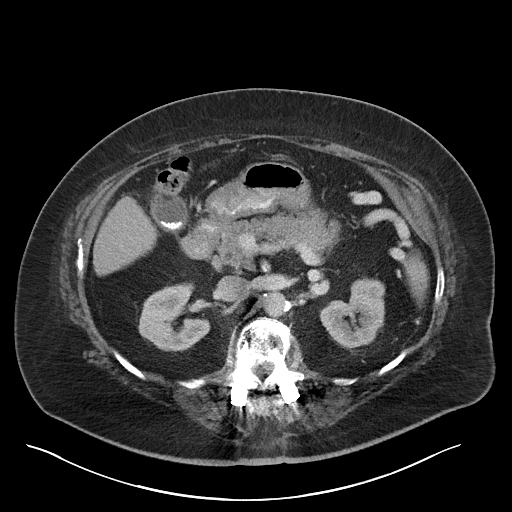
[im 70/89  soft-tissue]
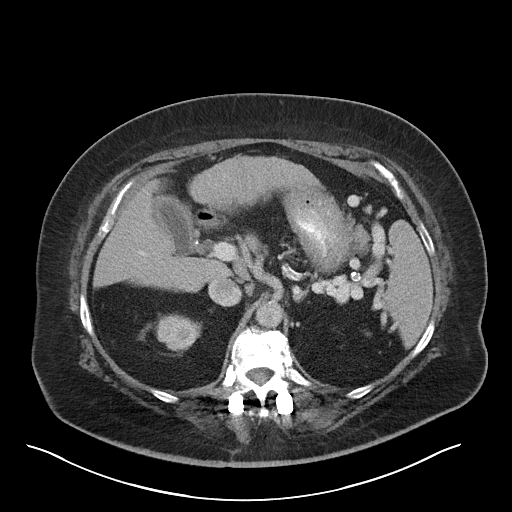
[im 75/89  soft-tissue]
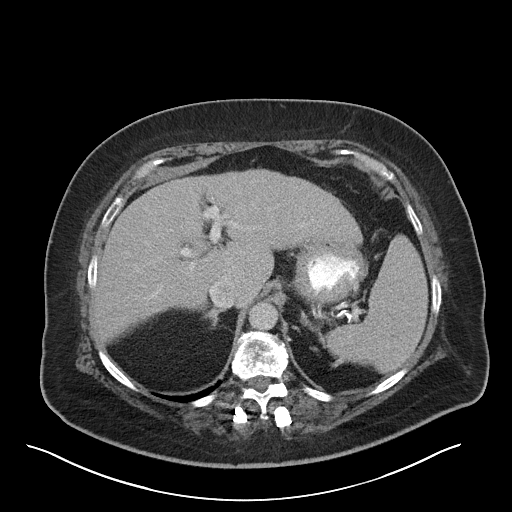
[im 84/89  soft-tissue]
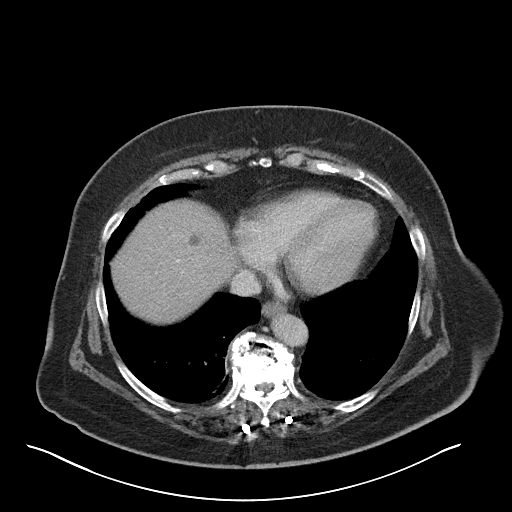

[Series 6: abd/pel w st · coronal · 0.82mm/px · 3 of 108 slices shown]
[im 36/108  soft-tissue]
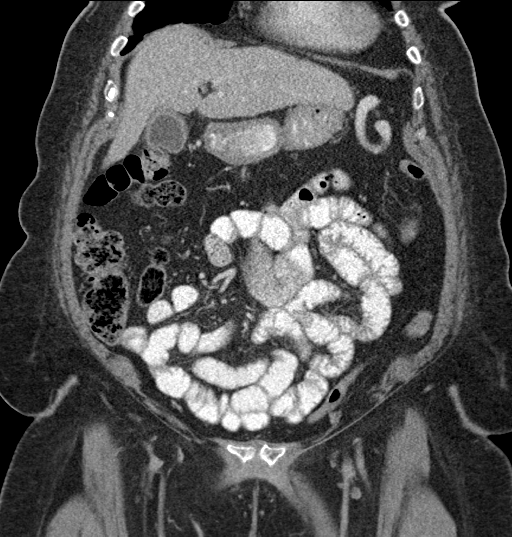
[im 48/108  soft-tissue]
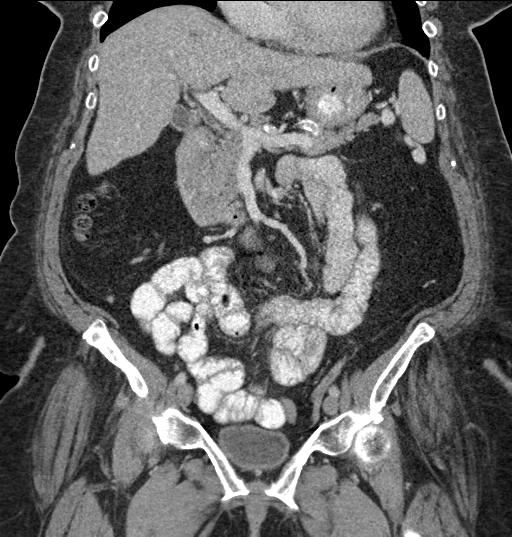
[im 60/108  soft-tissue]
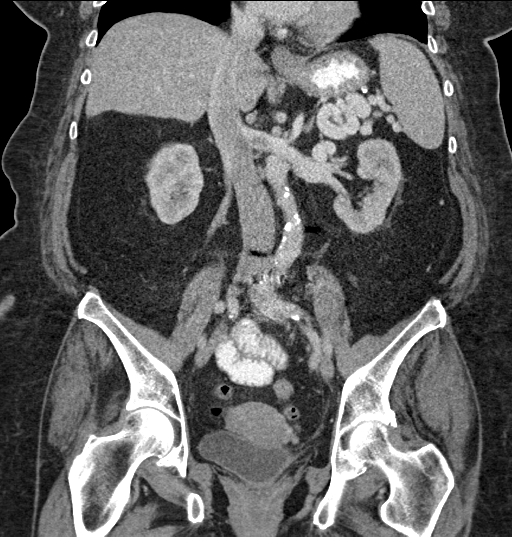

[16 of 46 positions shown; findings below may reference images not displayed]

FINDINGS: Lower chest: No acute abnormality. Borderline cardiomegaly without
pericardial effusion.

Hepatobiliary: Subtle surface nodularity of the liver consistent
with cirrhosis. 9 mm hypodensity in the right hepatic dome too small
to further characterize. Statistically this is likely to represent a
cyst or hemangioma in the absence of pre-existing malignancies. No
biliary dilatation. Slightly thick-walled appearance of the
gallbladder with layering gallstones. Cholecystitis is not entirely
excluded. Some of this could be due to underdistention however. No
gallstones are seen within the common duct.

Pancreas: Normal

Spleen: No splenomegaly or mass.

Adrenals/Urinary Tract: Normal bilateral adrenal glands. No
enhancing renal mass nor obstructive uropathy. The bladder is
nondistended.

Stomach/Bowel: Contracted stomach. Normal small bowel rotation with
normal ligament of Treitz position. No bowel inflammation or
obstruction. Normal appendix. A moderate amount of stool is seen
within proximal large bowel to the transverse colon level.

Vascular/Lymphatic: Aortoiliac atherosclerosis. No aneurysm. Splenic
varices and minimally recannulized appearing umbilical vein.

Reproductive: Uterus and bilateral adnexa are unremarkable.

Other: Tiny fat containing umbilical hernia.

Musculoskeletal: Extensive spinal fusion hardware along the lower
thoracic and lumbar spine through S1 with thoracolumbar spondylitic
disc space narrowing and endplate spurring. Interbody blocks are
seen from T12 through S1.
IMPRESSION: 1. Nodular liver contour with splenic varices and slightly
recannulized appearing umbilical vein in keeping with changes of
cirrhosis.
2. Nonspecific 9 mm hypodensity in the right hepatic dome without
worrisome features, more likely to represent a cyst hemangioma.
Given changes of cirrhosis, if further workup is necessary, MRI or
CT without and with IV contrast using a liver lesion protocol may
prove useful.
3. Mildly thickened gallbladder wall without gallbladder distention
but with layering stones. Some of the thickening could be due to
underdistention but changes of a cholecystitis likely chronic are
not excluded.
4. Thoracolumbar spondylosis with thoracolumbar fusion and interbody
blocks as above.

## 2018-07-17 ENCOUNTER — Other Ambulatory Visit (INDEPENDENT_AMBULATORY_CARE_PROVIDER_SITE_OTHER): Payer: Medicare HMO

## 2018-07-17 DIAGNOSIS — E118 Type 2 diabetes mellitus with unspecified complications: Secondary | ICD-10-CM | POA: Diagnosis not present

## 2018-07-17 LAB — CBC WITH DIFFERENTIAL/PLATELET
BASOS ABS: 0 10*3/uL (ref 0.0–0.1)
Basophils Relative: 0.6 % (ref 0.0–3.0)
EOS ABS: 0.3 10*3/uL (ref 0.0–0.7)
Eosinophils Relative: 5.7 % — ABNORMAL HIGH (ref 0.0–5.0)
HEMATOCRIT: 39.3 % (ref 36.0–46.0)
HEMOGLOBIN: 13.2 g/dL (ref 12.0–15.0)
LYMPHS PCT: 21.4 % (ref 12.0–46.0)
Lymphs Abs: 1.2 10*3/uL (ref 0.7–4.0)
MCHC: 33.5 g/dL (ref 30.0–36.0)
MCV: 94.6 fl (ref 78.0–100.0)
MONO ABS: 0.6 10*3/uL (ref 0.1–1.0)
Monocytes Relative: 9.8 % (ref 3.0–12.0)
NEUTROS ABS: 3.5 10*3/uL (ref 1.4–7.7)
Neutrophils Relative %: 62.5 % (ref 43.0–77.0)
PLATELETS: 163 10*3/uL (ref 150.0–400.0)
RBC: 4.16 Mil/uL (ref 3.87–5.11)
RDW: 14.6 % (ref 11.5–15.5)
WBC: 5.6 10*3/uL (ref 4.0–10.5)

## 2018-07-17 LAB — URINALYSIS, ROUTINE W REFLEX MICROSCOPIC
BILIRUBIN URINE: NEGATIVE
HGB URINE DIPSTICK: NEGATIVE
Ketones, ur: NEGATIVE
Nitrite: NEGATIVE
Specific Gravity, Urine: 1.015 (ref 1.000–1.030)
TOTAL PROTEIN, URINE-UPE24: NEGATIVE
URINE GLUCOSE: NEGATIVE
Urobilinogen, UA: 0.2 (ref 0.0–1.0)
pH: 5.5 (ref 5.0–8.0)

## 2018-07-17 LAB — LIPID PANEL
Cholesterol: 169 mg/dL (ref 0–200)
HDL: 76.1 mg/dL (ref >39.00)
LDL Cholesterol: 71 mg/dL (ref 0–99)
NonHDL: 93.03
TRIGLYCERIDES: 110 mg/dL (ref 0.0–149.0)
Total CHOL/HDL Ratio: 2
VLDL: 22 mg/dL (ref 0.0–40.0)

## 2018-07-17 LAB — HEPATIC FUNCTION PANEL
ALBUMIN: 3.9 g/dL (ref 3.5–5.2)
ALK PHOS: 86 U/L (ref 39–117)
ALT: 25 U/L (ref 0–35)
AST: 30 U/L (ref 0–37)
BILIRUBIN TOTAL: 1 mg/dL (ref 0.2–1.2)
Bilirubin, Direct: 0.3 mg/dL (ref 0.0–0.3)
Total Protein: 6.6 g/dL (ref 6.0–8.3)

## 2018-07-17 LAB — TSH: TSH: 0.64 u[IU]/mL (ref 0.35–4.50)

## 2018-07-17 LAB — MICROALBUMIN / CREATININE URINE RATIO
Creatinine,U: 113.8 mg/dL
Microalb Creat Ratio: 1.2 mg/g (ref 0.0–30.0)
Microalb, Ur: 1.4 mg/dL (ref 0.0–1.9)

## 2018-07-17 LAB — BASIC METABOLIC PANEL
BUN: 30 mg/dL — ABNORMAL HIGH (ref 6–23)
CALCIUM: 10.4 mg/dL (ref 8.4–10.5)
CO2: 27 meq/L (ref 19–32)
Chloride: 101 mEq/L (ref 96–112)
Creatinine, Ser: 0.93 mg/dL (ref 0.40–1.20)
GFR: 62.34 mL/min (ref >60.00)
Glucose, Bld: 113 mg/dL — ABNORMAL HIGH (ref 70–99)
Potassium: 3.7 mEq/L (ref 3.5–5.1)
SODIUM: 139 meq/L (ref 135–145)

## 2018-07-17 LAB — HEMOGLOBIN A1C: Hgb A1c MFr Bld: 7.1 % — ABNORMAL HIGH (ref 4.6–6.5)

## 2018-07-23 NOTE — Progress Notes (Addendum)
Subjective:   Kelsey Keller is a 78 y.o. female who presents for Medicare Annual (Subsequent) preventive examination.  Review of Systems:  No ROS.  Medicare Wellness Visit. Additional risk factors are reflected in the social history.  Cardiac Risk Factors include: advanced age (>61mn, >>62women);diabetes mellitus;dyslipidemia;hypertension;obesity (BMI >30kg/m2) Sleep patterns: feels rested on waking, gets up 1-2 times nightly to void and sleeps 8-9 hours nightly.    Home Safety/Smoke Alarms: Feels safe in home. Smoke alarms in place.  Living environment; residence and Firearm Safety: 1-story house/ trailer, equipment: Walkers, Type: rWellsite geologist Type: Tub SSurveyor, quantity no firearms. Lives alone, no needs for DME, good support system Seat Belt Safety/Bike Helmet: Wears seat belt.     Objective:     Vitals: BP 136/70   Pulse 73   Resp (!) 95   Ht _0  (1.626 m)   Wt 241 lb (109.3 kg)   BMI 41.37 kg/m   Body mass index is 41.37 kg/m.  Advanced Directives 07/24/2018 04/10/2017 04/08/2017 04/07/2017 04/04/2017 10/19/2014  Does Patient Have a Medical Advance Directive? _1  No  Type of AParamedicof [place]villeLiving will [place]Living will [place]Living will [place]um[place]Living will - -  Does patient want to make changes to medical advance directive? - - No - Patient declined - - -  Copy of [place]in Chart? No - copy requested No - copy requested - - - -  Would patient like information on creating a medical advance directive? - - - - - Yes - EScientist, clinical (histocompatibility and immunogenetics)given    Tobacco Social History   Tobacco Use  Smoking Status Never Smoker  Smokeless Tobacco Never Used     Counseling given: Not Answered  Past Medical History:  Diagnosis Date  . ALLERGIC RHINITIS 04/29/2007  . Allergy   . Cataract    both eyes  . DIABETES MELLITUS, TYPE II  06/23/2007   type 2  . Elevated liver enzymes 03/2017  . Elevated tumor markers   . GERD (gastroesophageal reflux disease)   . HIATAL HERNIA, HX OF 04/29/2007  . HYPERLIPIDEMIA 04/29/2007   diet controlled on CoQ10  . HYPERTENSION 04/29/2007  . HYPOKALEMIA 05/02/2009  . HYPOTHYROIDISM 04/29/2007  . LOW BACK PAIN 04/29/2007  . Neuromuscular disorder ([place]    diabetic neuropathy in both legs  . OSTEOARTHRITIS, KNEE, LEFT 06/23/2007  . OSTEOPOROSIS NOS 05/12/2007  . SPINAL STENOSIS, LUMBAR 04/29/2007   Past Surgical History:  Procedure Laterality Date  . BACK SURGERY N/A 2014 x 2   T1 TO S 1   . CATARACT EXTRACTION W/ INTRAOCULAR LENS  IMPLANT, BILATERAL Bilateral 05/2011   doing well  . COLONOSCOPY WITH PROPOFOL N/A 04/10/2017   Procedure: COLONOSCOPY WITH PROPOFOL;  Surgeon: JMilus Banister MD;  Location: [facility];  Service: Endoscopy;  Laterality: N/A;  . COLONSCOPY  2011  . TOTAL KNEE ARTHROPLASTY  2006   right  . TOTAL KNEE ARTHROPLASTY Left 2012   Family History  Problem Relation Age of Onset  . Coronary artery disease Other        Female 1st degree relative  . Diabetes Other        1st degree relative  . Colon polyps Other   . Arthritis Mother   . Hypertension Mother   . Coronary artery disease Father   . Arthritis Brother   . Stroke Brother   . Hyperlipidemia Brother   .  COPD Brother   . Colon cancer Neg Hx   . Esophageal cancer Neg Hx   . Rectal cancer Neg Hx   . Stomach cancer Neg Hx    Social History   Socioeconomic History  . Marital status: Divorced    Spouse name: Not on file  . Number of children: 1  . Years of education: Not on file  . Highest education level: Not on file  Occupational History  . 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 Financial resource strain: Not very hard  . Food insecurity:    Worry: Never true    Inability: Never true  . Transportation needs:    Medical: No    Non-medical: No  Tobacco  Use  . Smoking status: Never Smoker  . Smokeless tobacco: Never Used  Substance and Sexual Activity  . Alcohol use: Yes    Alcohol/week: 0.0 standard drinks    Comment: One or two drinks in month  . Drug use: No  . Sexual activity: Never  Lifestyle  . Physical activity:    Days per week: 0 days    Minutes per session: 0 min  . Stress: Not at all  Relationships  . Social connections:    Talks on phone: More than three times a week    Gets together: More than three times a week    Attends religious service: More than 4 times per year    Active member of club or organization: Yes    Attends meetings of clubs or organizations: More than 4 times per year    Relationship status: Divorced  Other Topics Concern  . Not on file  Social History Narrative  . Not on file    Outpatient Encounter Medications as of 07/24/2018  Medication Sig  . ACCU-CHEK SOFTCLIX LANCETS lancets 1 each by Other route 2 (two) times daily. Use to help check blood sugars twice a day Dx e11.9  . alendronate (FOSAMAX) 70 MG tablet TAKE 1 TABLET (70 MG TOTAL) BY MOUTH ONCE A WEEK ON SUNDAY  . aspirin 81 MG EC tablet Take 81 mg by mouth daily.    Marland Kitchen atenolol-chlorthalidone (TENORETIC) 50-25 MG tablet Take 1 tablet by mouth daily.  . Blood Glucose Calibration (ACCU-CHEK COMPACT PLUS CONTROL) SOLN Use as directed Dx E11.9  . Blood Glucose Monitoring Suppl (ACCU-CHEK AVIVA PLUS) w/Device KIT Use as directed to check blood sugars everyday Dx e11.9  . Calcium Carb-Cholecalciferol (CALCIUM 600+D3) 600-800 MG-UNIT TABS Take 1 tablet by mouth every evening.  . cetirizine (ZYRTEC) 10 MG tablet Take 1 tablet (10 mg total) by mouth daily. (Patient taking differently: Take 10 mg by mouth daily as needed for allergies or rhinitis. )  . Co-Enzyme Q-10 100 MG CAPS Take 100 mg by mouth daily.  . fluticasone (FLONASE) 50 MCG/ACT nasal spray Place 2 sprays into both nostrils daily.  Marland Kitchen gabapentin (NEURONTIN) 300 MG capsule 1 tab by  mouth three times per day  . glimepiride (AMARYL) 4 MG tablet Take 1 tablet (4 mg total) by mouth 2 (two) times daily.  Marland Kitchen glucose blood test strip Use as directed two times a day, diagnosis code E11.9  . levothyroxine (SYNTHROID, LEVOTHROID) 175 MCG tablet Take 1 tablet (175 mcg total) by mouth daily before breakfast.  . meloxicam (MOBIC) 7.5 MG tablet Take 1 tablet (7.5 mg total) by mouth daily as needed for pain.  . metFORMIN (GLUCOPHAGE) 1000 MG tablet Take 1 tablet (1,000 mg  total) by mouth 2 (two) times daily with a meal.  . omeprazole (PRILOSEC) 40 MG capsule TAKE ONE CAPSULE BY MOUTH 30 MINUTES BEFORE BREAKFAST AND DINNER  . pioglitazone (ACTOS) 15 MG tablet Take 1 tablet (15 mg total) by mouth daily.  . Red Yeast Rice Extract 600 MG CAPS Take 1,200 mg by mouth 2 (two) times daily.  . [DISCONTINUED] HYDROcodone-acetaminophen (NORCO/VICODIN) 5-325 MG tablet Take 1 tablet by mouth every 6 (six) hours.   No facility-administered encounter medications on file as of 07/24/2018.     Activities of Daily Living In your present state of health, do you have any difficulty performing the following activities: 07/24/2018  Hearing? N  Vision? N  Difficulty concentrating or making decisions? N  Walking or climbing stairs? N  Dressing or bathing? N  Doing errands, shopping? N  Preparing Food and eating ? N  Using the Toilet? N  In the past six months, have you accidently leaked urine? N  Do you have problems with loss of bowel control? N  Managing your Medications? N  Managing your Finances? N  Housekeeping or managing your Housekeeping? N  Some recent data might be hidden    Patient Care Team: Biagio Borg, MD as PCP - General    Assessment:   This is a routine wellness examination for [facility]. Physical assessment deferred to PCP.  Exercise Activities and Dietary recommendations Current Exercise Habits: The patient does not participate in regular exercise at present, Exercise limited  by: orthopedic condition(s)  Diet (meal preparation, eat out, water intake, caffeinated beverages, dairy products, fruits and vegetables): in general, a "healthy" diet  , well balanced Reports she is doing weight watcher program.eats a variety of fruits and vegetables daily, limits salt, fat/cholesterol, sugar,carbohydrates,caffeine, drinks 6-8 glasses of water daily.  Reviewed heart healthy and diabetic diet. Relevant patient education assigned to patient using Emmi.  Goals    . Exercise 150 minutes per week (moderate activity)     Exercise plan bicycle and upper body strength; Accomplished 30 min  Likes work around the house Goal is to assist with back pain/ DDD      . Patient Stated     Stay socially involved with church and friends. Continue with weight watchers and start to get back exercising with [name].    . Weight < 200 lb (90.719 kg)     Is using myfitnesspal to document and plans to start using a recumbant bike;        Fall Risk Fall Risk  07/24/2018 07/24/2018 01/20/2018 07/21/2017 12/11/2016  Falls in the past year? 0 0 Yes No No  Comment - - - - -  Number falls in past yr: - - 1 - -  Comment - - - - -  Injury with Fall? - - No - -  Risk for fall due to : Impaired balance/gait;Impaired mobility - - - -  Follow up - - - - -    Depression Screen PHQ 2/9 Scores 07/24/2018 01/20/2018 07/21/2017 12/11/2016  PHQ - 2 Score 0 0 0 0  PHQ- 9 Score - 0 - -     Cognitive Function MMSE - Mini Mental State Exam 10/19/2014  Orientation to time 5  Orientation to Place 5  Registration 3  Attention/ Calculation 3  Language- name 2 objects 2  Language- repeat 1  Language- follow 3 step command 3  Language- read & follow direction 1  Write a sentence 1  Copy design 1  Ad8 score reviewed for issues:  Issues making decisions: no  Less interest in hobbies / activities: no  Repeats questions, stories (family complaining): no  Trouble using ordinary gadgets (microwave,  computer, phone):no  Forgets the month or year: no  Mismanaging finances: no  Remembering appts: no  Daily problems with thinking and/or memory: no Ad8 score is= 0  Immunization History  Administered Date(s) Administered  . Influenza, High Dose Seasonal PF 04/15/2018  . Influenza-Unspecified 05/05/2017, 04/15/2018  . Pneumococcal Conjugate-13 04/15/2013  . Pneumococcal Polysaccharide-23 04/27/2008  . Td 10/25/2008  . Zoster 10/20/2007    Qualifies for Shingles Vaccine.  Education and vaccine print-out provided  Screening Tests Health Maintenance  Topic Date Due  . FOOT EXAM  07/21/2018  . OPHTHALMOLOGY EXAM  08/14/2018  . TETANUS/TDAP  10/26/2018  . HEMOGLOBIN A1C  01/15/2019  . COLONOSCOPY  04/11/2027  . INFLUENZA VACCINE  Completed  . DEXA SCAN  Completed  . PNA vac Low Risk Adult  Completed      Plan:     Resources provided for Type 2 diabetes support group through the [facility].  Continue doing brain stimulating activities (puzzles, reading, adult coloring books, staying active) to keep memory sharp.   Continue to eat heart healthy diet (full of fruits, vegetables, whole grains, lean protein, water--limit salt, fat, and sugar intake) and increase physical activity as tolerated.  I have personally reviewed and noted the following in the patient's chart:   . Medical and social history . Use of alcohol, tobacco or illicit drugs  . Current medications and supplements . Functional ability and status . Nutritional status . Physical activity . Advanced directives . List of other physicians . Vitals . Screenings to include cognitive, depression, and falls . Referrals and appointments  In addition, I have reviewed and discussed with patient certain preventive protocols, quality metrics, and best practice recommendations. A written personalized care plan for preventive services as well as general preventive health recommendations were provided to  patient.     Michiel Cowboy, RN  07/24/2018   Medical screening examination/treatment/procedure(s) were performed by non-physician practitioner and as supervising physician I was immediately available for consultation/collaboration. I agree with above. Cathlean Cower, MD

## 2018-07-24 ENCOUNTER — Encounter: Payer: Self-pay | Admitting: Internal Medicine

## 2018-07-24 ENCOUNTER — Ambulatory Visit (INDEPENDENT_AMBULATORY_CARE_PROVIDER_SITE_OTHER)
Admission: RE | Admit: 2018-07-24 | Discharge: 2018-07-24 | Disposition: A | Discharge status: Home / Self Care | Payer: Medicare HMO | Source: Ambulatory Visit | Attending: Internal Medicine | Admitting: Internal Medicine

## 2018-07-24 ENCOUNTER — Ambulatory Visit: Payer: Medicare HMO | Admitting: Internal Medicine

## 2018-07-24 ENCOUNTER — Ambulatory Visit (INDEPENDENT_AMBULATORY_CARE_PROVIDER_SITE_OTHER): Payer: Medicare HMO | Admitting: *Deleted

## 2018-07-24 VITALS — BP 136/70 | HR 73 | Resp 95 | Ht 64.0 in | Wt 241.0 lb

## 2018-07-24 VITALS — BP 136/70 | HR 73 | Temp 98.4°F | Ht 64.0 in | Wt 241.0 lb

## 2018-07-24 DIAGNOSIS — E119 Type 2 diabetes mellitus without complications: Secondary | ICD-10-CM

## 2018-07-24 DIAGNOSIS — Z0000 Encounter for general adult medical examination without abnormal findings: Secondary | ICD-10-CM

## 2018-07-24 DIAGNOSIS — R634 Abnormal weight loss: Secondary | ICD-10-CM | POA: Insufficient documentation

## 2018-07-24 DIAGNOSIS — I279 Pulmonary heart disease, unspecified: Secondary | ICD-10-CM | POA: Diagnosis not present

## 2018-07-24 MED ORDER — TRAMADOL HCL 50 MG PO TABS: 50.0000 mg | ORAL_TABLET | Freq: Three times a day (TID) | ORAL | 5 refills | Status: DC | PRN

## 2018-07-24 NOTE — Assessment & Plan Note (Signed)
Seems likely debility related now improved, but has overall 20 lbs loss with left supraclavicular nodule of unclear significance; for cxr

## 2018-07-24 NOTE — Progress Notes (Signed)
Subjective:    Patient ID: Kelsey Keller, female    DOB: 25-Jun-1941, 78 y.o.   MRN: [id number]  HPI  Here for wellness and f/u;  Overall doing ok;  Pt denies Chest pain, worsening SOB, DOE, wheezing, orthopnea, PND, worsening LE edema, palpitations, dizziness or syncope.  Pt denies neurological change such as new headache, facial or extremity weakness.  Pt denies polydipsia, polyuria, or low sugar symptoms. Pt states overall good compliance with treatment and medications, good tolerability, and has been trying to follow appropriate diet.  Pt denies worsening depressive symptoms, suicidal ideation or panic. No fever, night sweats, loss of appetite, or other constitutional symptoms.  Pt states good ability with ADL's, has low fall risk, home safety reviewed and adequate, no other significant changes in hearing or vision, and only occasionally active with exercise.  Due for left side lumbar ablation for pain in 1 wk.  Plans to be more active after that, here with walker outside the home today but usually has cane at home.  Uses due to back pain and leg weakness. Wants to stop the gabapentin after that ablation.   Lost wt with wt watchers and some difficulty getting to food due to back pain and forced to be in bed some days. Wt Readings from Last 3 Encounters:  07/24/18 241 lb (109.3 kg)  05/15/18 246 lb 6 oz (111.8 kg)  01/20/18 261 lb (118.4 kg)   BP Readings from Last 3 Encounters:  07/24/18 136/70  05/22/18 125/63  05/15/18 116/70   Past Medical History:  Diagnosis Date  . ALLERGIC RHINITIS 04/29/2007  . Allergy   . Cataract    both eyes  . DIABETES MELLITUS, TYPE II 06/23/2007   type 2  . Elevated liver enzymes 03/2017  . Elevated tumor markers   . GERD (gastroesophageal reflux disease)   . HIATAL HERNIA, HX OF 04/29/2007  . HYPERLIPIDEMIA 04/29/2007   diet controlled on CoQ10  . HYPERTENSION 04/29/2007  . HYPOKALEMIA 05/02/2009  . HYPOTHYROIDISM 04/29/2007  . LOW BACK PAIN  04/29/2007  . Neuromuscular disorder ([place])    diabetic neuropathy in both legs  . OSTEOARTHRITIS, KNEE, LEFT 06/23/2007  . OSTEOPOROSIS NOS 05/12/2007  . SPINAL STENOSIS, LUMBAR 04/29/2007   Past Surgical History:  Procedure Laterality Date  . BACK SURGERY N/A 2014 x 2   T1 TO S 1   . CATARACT EXTRACTION W/ INTRAOCULAR LENS  IMPLANT, BILATERAL Bilateral 05/2011   doing well  . COLONOSCOPY WITH PROPOFOL N/A 04/10/2017   Procedure: COLONOSCOPY WITH PROPOFOL;  Surgeon: [name], MD;  Location: [facility];  Service: Endoscopy;  Laterality: N/A;  . COLONSCOPY  2011  . TOTAL KNEE ARTHROPLASTY  2006   right  . TOTAL KNEE ARTHROPLASTY Left 2012    reports that she has never smoked. She has never used smokeless tobacco. She reports current alcohol use. She reports that she does not use drugs. family history includes Arthritis in her brother and mother; COPD in her brother; Colon polyps in an other family member; Coronary artery disease in her father and another family member; Diabetes in an other family member; Hyperlipidemia in her brother; Hypertension in her mother; Stroke in her brother. No Known Allergies Current Outpatient Medications on File Prior to Visit  Medication Sig Dispense Refill  . ACCU-CHEK SOFTCLIX LANCETS lancets 1 each by Other route 2 (two) times daily. Use to help check blood sugars twice a day Dx e11.9 100 each 3  . alendronate (FOSAMAX) 70  MG tablet TAKE 1 TABLET (70 MG TOTAL) BY MOUTH ONCE A WEEK ON SUNDAY 12 tablet 3  . aspirin 81 MG EC tablet Take 81 mg by mouth daily.      Marland Kitchen atenolol-chlorthalidone (TENORETIC) 50-25 MG tablet Take 1 tablet by mouth daily. 90 tablet 3  . Blood Glucose Calibration (ACCU-CHEK COMPACT PLUS CONTROL) SOLN Use as directed Dx E11.9 3 each 3  . Blood Glucose Monitoring Suppl (ACCU-CHEK AVIVA PLUS) w/Device KIT Use as directed to check blood sugars everyday Dx e11.9 1 kit 0  . Calcium Carb-Cholecalciferol (CALCIUM 600+D3) 600-800  MG-UNIT TABS Take 1 tablet by mouth every evening.    . cetirizine (ZYRTEC) 10 MG tablet Take 1 tablet (10 mg total) by mouth daily. (Patient taking differently: Take 10 mg by mouth daily as needed for allergies or rhinitis. ) 90 tablet 3  . Co-Enzyme Q-10 100 MG CAPS Take 100 mg by mouth daily.    . fluticasone (FLONASE) 50 MCG/ACT nasal spray Place 2 sprays into both nostrils daily. 48 g 3  . gabapentin (NEURONTIN) 300 MG capsule 1 tab by mouth three times per day 270 capsule 3  . glimepiride (AMARYL) 4 MG tablet Take 1 tablet (4 mg total) by mouth 2 (two) times daily. 180 tablet 3  . glucose blood test strip Use as directed two times a day, diagnosis code E11.9 200 each 3  . levothyroxine (SYNTHROID, LEVOTHROID) 175 MCG tablet Take 1 tablet (175 mcg total) by mouth daily before breakfast. 90 tablet 3  . meloxicam (MOBIC) 7.5 MG tablet Take 1 tablet (7.5 mg total) by mouth daily as needed for pain. 90 tablet 3  . metFORMIN (GLUCOPHAGE) 1000 MG tablet Take 1 tablet (1,000 mg total) by mouth 2 (two) times daily with a meal. 180 tablet 3  . omeprazole (PRILOSEC) 40 MG capsule TAKE ONE CAPSULE BY MOUTH 30 MINUTES BEFORE BREAKFAST AND DINNER 180 capsule 3  . pioglitazone (ACTOS) 15 MG tablet Take 1 tablet (15 mg total) by mouth daily. 90 tablet 3  . Red Yeast Rice Extract 600 MG CAPS Take 1,200 mg by mouth 2 (two) times daily.     No current facility-administered medications on file prior to visit.    Review of Systems Constitutional: Negative for other unusual diaphoresis, sweats, appetite or weight changes HENT: Negative for other worsening hearing loss, ear pain, facial swelling, mouth sores or neck stiffness.   Eyes: Negative for other worsening pain, redness or other visual disturbance.  Respiratory: Negative for other stridor or swelling Cardiovascular: Negative for other palpitations or other chest pain  Gastrointestinal: Negative for worsening diarrhea or loose stools, blood in stool,  distention or other pain Genitourinary: Negative for hematuria, flank pain or other change in urine volume.  Musculoskeletal: Negative for myalgias or other joint swelling.  Skin: Negative for other color change, or other wound or worsening drainage.  Neurological: Negative for other syncope or numbness. Hematological: Negative for other adenopathy or swelling Psychiatric/Behavioral: Negative for hallucinations, other worsening agitation, SI, self-injury, or new decreased concentration All other system neg per pt    Objective:   Physical Exam BP 136/70   Pulse 73   Temp 98.4 F (36.9 C) (Oral)   Ht '5\' 4"'  (1.626 m)   Wt 241 lb (109.3 kg)   SpO2 95%   BMI 41.37 kg/m  VS noted,  Constitutional: Pt is oriented to person, place, and time. Appears well-developed and well-nourished, in no significant distress and comfortable Head: Normocephalic and atraumatic  Eyes: Conjunctivae and EOM are normal. Pupils are equal, round, and reactive to light Right Ear: External ear normal without discharge Left Ear: External ear normal without discharge Nose: Nose without discharge or deformity Mouth/Throat: Oropharynx is without other ulcerations and moist  Neck: Normal range of motion. Neck supple. No JVD present. No tracheal deviation present or significant neck LA or mass except for single small subq nodule vs LA to left supraclavicular fossa Cardiovascular: Normal rate, regular rhythm, normal heart sounds and intact distal pulses.   Pulmonary/Chest: WOB normal and breath sounds without rales or wheezing  Abdominal: Soft. Bowel sounds are normal. NT. No HSM  Musculoskeletal: Normal range of motion. Exhibits no edema Lymphadenopathy: Has no other cervical adenopathy.  Neurological: Pt is alert and oriented to person, place, and time. Pt has normal reflexes. No cranial nerve deficit. Motor grossly intact, Gait intact Skin: Skin is warm and dry. No rash noted or new ulcerations Psychiatric:  Has  normal mood and affect. Behavior is normal without agitation No other exam findings Lab Results  Component Value Date   WBC 5.6 07/17/2018   HGB 13.2 07/17/2018   HCT 39.3 07/17/2018   PLT 163.0 07/17/2018   GLUCOSE 113 (H) 07/17/2018   CHOL 169 07/17/2018   TRIG 110.0 07/17/2018   HDL 76.10 07/17/2018   LDLDIRECT 107.5 07/21/2013   LDLCALC 71 07/17/2018   ALT 25 07/17/2018   AST 30 07/17/2018   NA 139 07/17/2018   K 3.7 07/17/2018   CL 101 07/17/2018   CREATININE 0.93 07/17/2018   BUN 30 (H) 07/17/2018   CO2 27 07/17/2018   TSH 0.64 07/17/2018   INR 1.1 (H) 05/15/2018   HGBA1C 7.1 (H) 07/17/2018   MICROALBUR 1.4 07/17/2018       Assessment & Plan:

## 2018-07-24 NOTE — Patient Instructions (Addendum)
Continue doing brain stimulating activities (puzzles, reading, adult coloring books, staying active) to keep memory sharp.   Continue to eat heart healthy diet (full of fruits, vegetables, whole grains, lean protein, water--limit salt, fat, and sugar intake) and increase physical activity as tolerated.   Ms. Kelsey Keller , Thank you for taking time to come for your Medicare Wellness Visit. I appreciate your ongoing commitment to your health goals. Please review the following plan we discussed and let me know if I can assist you in the future.   These are the goals we discussed: Goals    . Exercise 150 minutes per week (moderate activity)     Exercise plan bicycle and upper body strength; Accomplished 30 min  Likes work around the house Goal is to assist with back pain/ DDD      . Patient Stated     Stay socially involved with church and friends. Continue with weight watchers and start to get back exercising with [name].    . Weight < 200 lb (90.719 kg)     Is using myfitnesspal to document and plans to start using a recumbant bike;        This is a list of the screening recommended for you and due dates:  Health Maintenance  Topic Date Due  . Complete foot exam   07/21/2018  . Eye exam for diabetics  08/14/2018  . Tetanus Vaccine  10/26/2018  . Hemoglobin A1C  01/15/2019  . Colon Cancer Screening  04/11/2027  . Flu Shot  Completed  . DEXA scan (bone density measurement)  Completed  . Pneumonia vaccines  Completed     This support group for Type 2 diabetics and their family members addresses a wide range of topics related to diabetes. Registration Details Call [phone] for more information. Fees & Payment This program is free. Marland Kitchen Description . Schedule & Location . Related Events This is a free support group for individuals with Type 2 Diabetes and their family members. The group meets from 6 to 7 p.m. on the second Monday of each month. Check-in is at 5:45 p.m. The  meetings are held at [place] [facility] [place] located at [facility] [place], [place], fourth floor conference room.  Type 1 Diabetes support group first Wednesday of every month 6-7 PM [phone] Please arrive 10 minutes early [address]

## 2018-07-24 NOTE — Patient Instructions (Addendum)
Please continue all other medications as before, and refills have been done if requested - the tramadol  Please have the pharmacy call with any other refills you may need.  Please continue your efforts at being more active, low cholesterol diet, and weight control.  You are otherwise up to date with prevention measures today.  Please keep your appointments with your specialists as you may have planned  Please go to the XRAY Department in the Basement (go straight as you get off the elevator) for the x-ray testing  You will be contacted by phone if any changes need to be made immediately.  Otherwise, you will receive a letter about your results with an explanation, but please check with MyChart first.  Please remember to sign up for MyChart if you have not done so, as this will be important to you in the future with finding out test results, communicating by private email, and scheduling acute appointments online when needed.  Please return in 6 months, or sooner if needed, with Lab testing done 3-5 days before

## 2018-07-24 NOTE — Assessment & Plan Note (Signed)
stable overall by history and exam, recent data reviewed with pt, and pt to continue medical treatment as before,  to f/u any worsening symptoms or concerns  

## 2018-07-24 NOTE — Assessment & Plan Note (Signed)

## 2018-07-31 DIAGNOSIS — M47816 Spondylosis without myelopathy or radiculopathy, lumbar region: Secondary | ICD-10-CM | POA: Diagnosis not present

## 2018-07-31 DIAGNOSIS — M461 Sacroiliitis, not elsewhere classified: Secondary | ICD-10-CM | POA: Diagnosis not present

## 2018-08-05 DIAGNOSIS — M461 Sacroiliitis, not elsewhere classified: Secondary | ICD-10-CM | POA: Diagnosis not present

## 2018-08-06 ENCOUNTER — Ambulatory Visit: Payer: Self-pay | Admitting: *Deleted

## 2018-08-06 NOTE — Telephone Encounter (Signed)
Pt reports fell Monday; tripped over carpet and hit side on chair. States left rib area under breast "Towards side" painful, intermittent. Denies swelling, no bruising, redness, no lacerations. States pain is 7/10 with coughing, deep breathing or when lying on side. Denies SOB, pain does not radiate. States pain has not worsened since Monday , "But not much better." Reports has taken tramadol, ineffective. Appt made with Dr. Jenny Reichmann tomorrow AM. Care advise given per protocol. Instructed to go to ED if pain worsens, radiates, SOB occurs.  Reason for Disposition . [1] High-risk adult (e.g., age > 16, osteoporosis, chronic steroid use) AND [2] still hurts  Answer Assessment - Initial Assessment Questions 1. MECHANISM: "How did the injury happen?"    Fall 2. ONSET: "When did the injury happen?" (Minutes or hours ago)     Monday 3. LOCATION: "Where on the chest is the injury located?"     Rib cage under left breast, outer edge 4. APPEARANCE: "What does the injury look like?"     No bruising or swelling 5. BLEEDING: "Is there any bleeding now? If so, ask: How long has it been bleeding?"     no 6. SEVERITY: "Any difficulty with breathing?"     Pain when taking deep breath 7. SIZE: For cuts, bruises, or swelling, ask: "How large is it?" (e.g., inches or centimeters)     N/A 8. PAIN: "Is there pain?" If so, ask: "How bad is the pain?"   (e.g., Scale 1-10; or mild, moderate, severe)     7/10, with deep breaths, coughing. Lying on either side. NO SOB 9. TETANUS: For any breaks in the skin, ask: "When was the last tetanus booster?"     N/A  Protocols used: CHEST INJURY-A-AH

## 2018-08-07 ENCOUNTER — Encounter: Payer: Self-pay | Admitting: Internal Medicine

## 2018-08-07 ENCOUNTER — Ambulatory Visit (INDEPENDENT_AMBULATORY_CARE_PROVIDER_SITE_OTHER)
Admission: RE | Admit: 2018-08-07 | Discharge: 2018-08-07 | Disposition: A | Discharge status: Home / Self Care | Payer: Medicare HMO | Source: Ambulatory Visit | Attending: Internal Medicine | Admitting: Internal Medicine

## 2018-08-07 ENCOUNTER — Ambulatory Visit (INDEPENDENT_AMBULATORY_CARE_PROVIDER_SITE_OTHER): Payer: Medicare HMO | Admitting: Internal Medicine

## 2018-08-07 VITALS — BP 144/86 | HR 77 | Temp 98.1°F | Ht 64.0 in | Wt 238.0 lb

## 2018-08-07 DIAGNOSIS — R0781 Pleurodynia: Secondary | ICD-10-CM | POA: Diagnosis not present

## 2018-08-07 DIAGNOSIS — R0789 Other chest pain: Secondary | ICD-10-CM

## 2018-08-07 DIAGNOSIS — I10 Essential (primary) hypertension: Secondary | ICD-10-CM | POA: Diagnosis not present

## 2018-08-07 DIAGNOSIS — E119 Type 2 diabetes mellitus without complications: Secondary | ICD-10-CM

## 2018-08-07 DIAGNOSIS — R69 Illness, unspecified: Secondary | ICD-10-CM | POA: Diagnosis not present

## 2018-08-07 DIAGNOSIS — S299XXA Unspecified injury of thorax, initial encounter: Secondary | ICD-10-CM | POA: Diagnosis not present

## 2018-08-07 MED ORDER — GLUCOSE BLOOD VI STRP: ORAL_STRIP | 3 refills | Status: DC

## 2018-08-07 MED ORDER — TIZANIDINE HCL 2 MG PO TABS: 2.0000 mg | ORAL_TABLET | Freq: Four times a day (QID) | ORAL | 0 refills | Status: DC | PRN

## 2018-08-07 NOTE — Patient Instructions (Signed)
Please take all new medication as prescribed - the muscle relaxer as needed  Please continue all other medications as before, including the tramadol  Please have the pharmacy call with any other refills you may need.  Please keep your appointments with your specialists as you may have planned  Please go to the XRAY Department in the Basement (go straight as you get off the elevator) for the x-ray testing  You will be contacted by phone if any changes need to be made immediately.  Otherwise, you will receive a letter about your results with an explanation, but please check with MyChart first.  Please remember to sign up for MyChart if you have not done so, as this will be important to you in the future with finding out test results, communicating by private email, and scheduling acute appointments online when needed.

## 2018-08-07 NOTE — Assessment & Plan Note (Signed)
stable overall by history and exam, recent data reviewed with pt, and pt to continue medical treatment as before,  to f/u any worsening symptoms or concerns  

## 2018-08-07 NOTE — Progress Notes (Signed)
Subjective:    Patient ID: Kelsey Keller, female    DOB: [birth year], 78 y.o.   MRN: [id number]  HPI  Here after an unfortunate trip and [place] in the [place] after the funeral, just got the left foot caught on the carpet and went forward, striking the left anterolat chest on something hard, not sure what since happened so fast. Occurred x 3 days, moderate, constant pain.  Much worse to twist, bend, lean forward or cough or sneeze.  Pt denies other chest pain, increased sob or doe, wheezing, orthopnea, PND, increased LE swelling, palpitations, dizziness or syncope.  Pt denies polydipsia, polyuria Past Medical History:  Diagnosis Date  . ALLERGIC RHINITIS 04/29/2007  . Allergy   . Cataract    both eyes  . DIABETES MELLITUS, TYPE II 06/23/2007   type 2  . Elevated liver enzymes 03/2017  . Elevated tumor markers   . GERD (gastroesophageal reflux disease)   . HIATAL HERNIA, HX OF 04/29/2007  . HYPERLIPIDEMIA 04/29/2007   diet controlled on CoQ10  . HYPERTENSION 04/29/2007  . HYPOKALEMIA 05/02/2009  . HYPOTHYROIDISM 04/29/2007  . LOW BACK PAIN 04/29/2007  . Neuromuscular disorder ([place])    diabetic neuropathy in both legs  . OSTEOARTHRITIS, KNEE, LEFT 06/23/2007  . OSTEOPOROSIS NOS 05/12/2007  . SPINAL STENOSIS, LUMBAR 04/29/2007   Past Surgical History:  Procedure Laterality Date  . BACK SURGERY N/A 2014 x 2   T1 TO S 1   . CATARACT EXTRACTION W/ INTRAOCULAR LENS  IMPLANT, BILATERAL Bilateral 05/2011   doing well  . COLONOSCOPY WITH PROPOFOL N/A 04/10/2017   Procedure: COLONOSCOPY WITH PROPOFOL;  Surgeon: [name], MD;  Location: [facility];  Service: Endoscopy;  Laterality: N/A;  . COLONSCOPY  2011  . TOTAL KNEE ARTHROPLASTY  2006   right  . TOTAL KNEE ARTHROPLASTY Left 2012    reports that she has never smoked. She has never used smokeless tobacco. She reports current alcohol use. She reports that she does not use drugs. family history includes Arthritis in her  brother and mother; COPD in her brother; Colon polyps in an other family member; Coronary artery disease in her father and another family member; Diabetes in an other family member; Hyperlipidemia in her brother; Hypertension in her mother; Stroke in her brother. No Known Allergies Current Outpatient Medications on File Prior to Visit  Medication Sig Dispense Refill  . ACCU-CHEK SOFTCLIX LANCETS lancets 1 each by Other route 2 (two) times daily. Use to help check blood sugars twice a day Dx e11.9 100 each 3  . alendronate (FOSAMAX) 70 MG tablet TAKE 1 TABLET (70 MG TOTAL) BY MOUTH ONCE A WEEK ON SUNDAY 12 tablet 3  . aspirin 81 MG EC tablet Take 81 mg by mouth daily.      Marland Kitchen atenolol-chlorthalidone (TENORETIC) 50-25 MG tablet Take 1 tablet by mouth daily. 90 tablet 3  . Blood Glucose Calibration (ACCU-CHEK COMPACT PLUS CONTROL) SOLN Use as directed Dx E11.9 3 each 3  . Blood Glucose Monitoring Suppl (ACCU-CHEK AVIVA PLUS) w/Device KIT Use as directed to check blood sugars everyday Dx e11.9 1 kit 0  . Calcium Carb-Cholecalciferol (CALCIUM 600+D3) 600-800 MG-UNIT TABS Take 1 tablet by mouth every evening.    . cetirizine (ZYRTEC) 10 MG tablet Take 1 tablet (10 mg total) by mouth daily. (Patient taking differently: Take 10 mg by mouth daily as needed for allergies or rhinitis. ) 90 tablet 3  . Co-Enzyme Q-10 100 MG CAPS Take 100  mg by mouth daily.    . fluticasone (FLONASE) 50 MCG/ACT nasal spray Place 2 sprays into both nostrils daily. 48 g 3  . glimepiride (AMARYL) 4 MG tablet Take 1 tablet (4 mg total) by mouth 2 (two) times daily. 180 tablet 3  . levothyroxine (SYNTHROID, LEVOTHROID) 175 MCG tablet Take 1 tablet (175 mcg total) by mouth daily before breakfast. 90 tablet 3  . meloxicam (MOBIC) 7.5 MG tablet Take 1 tablet (7.5 mg total) by mouth daily as needed for pain. 90 tablet 3  . metFORMIN (GLUCOPHAGE) 1000 MG tablet Take 1 tablet (1,000 mg total) by mouth 2 (two) times daily with a meal. 180  tablet 3  . omeprazole (PRILOSEC) 40 MG capsule TAKE ONE CAPSULE BY MOUTH 30 MINUTES BEFORE BREAKFAST AND DINNER 180 capsule 3  . pioglitazone (ACTOS) 15 MG tablet Take 1 tablet (15 mg total) by mouth daily. 90 tablet 3  . Red Yeast Rice Extract 600 MG CAPS Take 1,200 mg by mouth 2 (two) times daily.    . traMADol (ULTRAM) 50 MG tablet Take 1 tablet (50 mg total) by mouth every 8 (eight) hours as needed. 90 tablet 5   No current facility-administered medications on file prior to visit.    Review of Systems  Constitutional: Negative for other unusual diaphoresis or sweats HENT: Negative for ear discharge or swelling Eyes: Negative for other worsening visual disturbances Respiratory: Negative for stridor or other swelling  Gastrointestinal: Negative for worsening distension or other blood Genitourinary: Negative for retention or other urinary change Musculoskeletal: Negative for other MSK pain or swelling Skin: Negative for color change or other new lesions Neurological: Negative for worsening tremors and other numbness  Psychiatric/Behavioral: Negative for worsening agitation or other fatigue All other system neg per pt    Objective:   Physical Exam BP (!) 144/86   Pulse 77   Temp 98.1 F (36.7 C)   Ht '5\' 4"'$  (1.626 m)   Wt 238 lb (108 kg)   SpO2 95%   BMI 40.85 kg/m  VS noted,  Constitutional: Pt appears in NAD HENT: Head: NCAT.  Right Ear: External ear normal.  Left Ear: External ear normal.  Eyes: . Pupils are equal, round, and reactive to light. Conjunctivae and EOM are normal Nose: without d/c or deformity Neck: Neck supple. Gross normal ROM Cardiovascular: Normal rate and regular rhythm.   Pulmonary/Chest: Effort normal and breath sounds without rales or wheezing.  Abd:  Soft, NT, ND, + BS, no organomegaly but marked tender to left costal margin anterolateral localized, without skin change, bruising or swellng Neurological: Pt is alert. At baseline orientation, motor  grossly intact Skin: Skin is warm. No rashes, other new lesions, no LE edema Psychiatric: Pt behavior is normal without agitation  No other exam findings Lab Results  Component Value Date   WBC 5.6 07/17/2018   HGB 13.2 07/17/2018   HCT 39.3 07/17/2018   PLT 163.0 07/17/2018   GLUCOSE 113 (H) 07/17/2018   CHOL 169 07/17/2018   TRIG 110.0 07/17/2018   HDL 76.10 07/17/2018   LDLDIRECT 107.5 07/21/2013   LDLCALC 71 07/17/2018   ALT 25 07/17/2018   AST 30 07/17/2018   NA 139 07/17/2018   K 3.7 07/17/2018   CL 101 07/17/2018   CREATININE 0.93 07/17/2018   BUN 30 (H) 07/17/2018   CO2 27 07/17/2018   TSH 0.64 07/17/2018   INR 1.1 (H) 05/15/2018   HGBA1C 7.1 (H) 07/17/2018   MICROALBUR 1.4 07/17/2018  Assessment & Plan:

## 2018-08-07 NOTE — Assessment & Plan Note (Signed)
S/p fall, cant r/o injury vs fracture, for cxr/left rib films, pain control, muscle relaxer prn,  to f/u any worsening symptoms or concerns

## 2018-08-12 ENCOUNTER — Telehealth: Payer: Self-pay

## 2018-08-12 DIAGNOSIS — R69 Illness, unspecified: Secondary | ICD-10-CM | POA: Diagnosis not present

## 2018-08-12 MED ORDER — BLOOD GLUCOSE MONITOR KIT: PACK | 0 refills | Status: AC

## 2018-08-12 MED ORDER — GLUCOSE BLOOD VI STRP: ORAL_STRIP | 12 refills | Status: DC

## 2018-08-12 MED ORDER — LANCETS MISC: 11 refills | Status: DC

## 2018-08-12 MED ORDER — ONETOUCH ULTRA 2 W/DEVICE KIT: PACK | 0 refills | Status: AC

## 2018-08-12 NOTE — Telephone Encounter (Signed)
Copied from [place] [phone]. Topic: General - Other >> Aug 11, 2018  3:22 PM Leward Quan A wrote: Reason for CRM: Patient called to say that her insurance will not cover test strips and Pharmacy informed that she need an Rx for a new Glucometer and test strips and lancets. Say that [employer] [facility] will be covered. Ph# [phone]

## 2018-08-12 NOTE — Telephone Encounter (Signed)
Done erx 

## 2018-08-12 NOTE — Addendum Note (Signed)
Addended by: Biagio Borg on: 08/12/2018 12:38 PM   Modules accepted: Orders

## 2018-08-31 DIAGNOSIS — G894 Chronic pain syndrome: Secondary | ICD-10-CM | POA: Diagnosis not present

## 2018-08-31 DIAGNOSIS — M533 Sacrococcygeal disorders, not elsewhere classified: Secondary | ICD-10-CM | POA: Diagnosis not present

## 2018-08-31 DIAGNOSIS — G5703 Lesion of sciatic nerve, bilateral lower limbs: Secondary | ICD-10-CM | POA: Diagnosis not present

## 2018-09-07 DIAGNOSIS — Z6841 Body Mass Index (BMI) 40.0 and over, adult: Secondary | ICD-10-CM | POA: Diagnosis not present

## 2018-09-07 DIAGNOSIS — K219 Gastro-esophageal reflux disease without esophagitis: Secondary | ICD-10-CM | POA: Diagnosis not present

## 2018-09-07 DIAGNOSIS — E039 Hypothyroidism, unspecified: Secondary | ICD-10-CM | POA: Diagnosis not present

## 2018-09-07 DIAGNOSIS — R69 Illness, unspecified: Secondary | ICD-10-CM | POA: Diagnosis not present

## 2018-09-07 DIAGNOSIS — J309 Allergic rhinitis, unspecified: Secondary | ICD-10-CM | POA: Diagnosis not present

## 2018-09-07 DIAGNOSIS — M810 Age-related osteoporosis without current pathological fracture: Secondary | ICD-10-CM | POA: Diagnosis not present

## 2018-09-07 DIAGNOSIS — E1140 Type 2 diabetes mellitus with diabetic neuropathy, unspecified: Secondary | ICD-10-CM | POA: Diagnosis not present

## 2018-09-07 DIAGNOSIS — M2550 Pain in unspecified joint: Secondary | ICD-10-CM | POA: Diagnosis not present

## 2018-09-07 DIAGNOSIS — I10 Essential (primary) hypertension: Secondary | ICD-10-CM | POA: Diagnosis not present

## 2018-09-07 DIAGNOSIS — G8929 Other chronic pain: Secondary | ICD-10-CM | POA: Diagnosis not present

## 2018-09-08 DIAGNOSIS — M6281 Muscle weakness (generalized): Secondary | ICD-10-CM | POA: Diagnosis not present

## 2018-09-08 DIAGNOSIS — M545 Low back pain: Secondary | ICD-10-CM | POA: Diagnosis not present

## 2018-09-08 DIAGNOSIS — R2689 Other abnormalities of gait and mobility: Secondary | ICD-10-CM | POA: Diagnosis not present

## 2018-09-09 DIAGNOSIS — E119 Type 2 diabetes mellitus without complications: Secondary | ICD-10-CM | POA: Diagnosis not present

## 2018-09-10 DIAGNOSIS — M461 Sacroiliitis, not elsewhere classified: Secondary | ICD-10-CM | POA: Diagnosis not present

## 2018-09-10 DIAGNOSIS — R2689 Other abnormalities of gait and mobility: Secondary | ICD-10-CM | POA: Diagnosis not present

## 2018-09-10 DIAGNOSIS — M47816 Spondylosis without myelopathy or radiculopathy, lumbar region: Secondary | ICD-10-CM | POA: Diagnosis not present

## 2018-09-10 DIAGNOSIS — M4186 Other forms of scoliosis, lumbar region: Secondary | ICD-10-CM | POA: Diagnosis not present

## 2018-09-10 DIAGNOSIS — M545 Low back pain: Secondary | ICD-10-CM | POA: Diagnosis not present

## 2018-09-10 DIAGNOSIS — M6281 Muscle weakness (generalized): Secondary | ICD-10-CM | POA: Diagnosis not present

## 2018-09-14 ENCOUNTER — Encounter: Payer: Self-pay | Admitting: Internal Medicine

## 2018-09-14 ENCOUNTER — Ambulatory Visit (INDEPENDENT_AMBULATORY_CARE_PROVIDER_SITE_OTHER): Payer: Medicare HMO | Admitting: Internal Medicine

## 2018-09-14 ENCOUNTER — Ambulatory Visit: Payer: Self-pay | Admitting: *Deleted

## 2018-09-14 VITALS — BP 136/76 | HR 76 | Temp 97.8°F | Resp 16 | Ht 64.0 in | Wt 234.0 lb

## 2018-09-14 DIAGNOSIS — H8303 Labyrinthitis, bilateral: Secondary | ICD-10-CM

## 2018-09-14 MED ORDER — MECLIZINE HCL 12.5 MG PO TABS: 12.5000 mg | ORAL_TABLET | Freq: Three times a day (TID) | ORAL | 1 refills | Status: DC | PRN

## 2018-09-14 MED ORDER — PREDNISONE 20 MG PO TABS: 20.0000 mg | ORAL_TABLET | Freq: Two times a day (BID) | ORAL | 0 refills | Status: AC

## 2018-09-14 NOTE — Telephone Encounter (Signed)
Pt having dizziness that started over the weekend. She thinks it may be a sinus infection or congestion. Has stuffy nose, head feels heavy and dizziness when she tries to sit up, getting out of bed and standing. Has been using her walker for support.  She denies fever.  Is hydrated. Appointment scheduled per protocol. Reminded to have someone drive her to appointment . Pt voiced understanding. Routing to flow at [facility] at [facility].  Reason for Disposition . [1] MODERATE dizziness (e.g., interferes with normal activities) AND [2] has NOT been evaluated by physician for this  (Exception: dizziness caused by heat exposure, sudden standing, or poor fluid intake)  Answer Assessment - Initial Assessment Questions 1. DESCRIPTION: "Describe your dizziness."     Head feeling full 2. LIGHTHEADED: "Do you feel lightheaded?" (e.g., somewhat faint, woozy, weak upon standing)     woozy 3. VERTIGO: "Do you feel like either you or the room is spinning or tilting?" (i.e. vertigo)     no 4. SEVERITY: "How bad is it?"  "Do you feel like you are going to faint?" "Can you stand and walk?"   - MILD - walking normally   - MODERATE - interferes with normal activities (e.g., work, school)    - SEVERE - unable to stand, requires support to walk, feels like passing out now.      moderate 5. ONSET:  "When did the dizziness begin?"     Over the past weekend 6. AGGRAVATING FACTORS: "Does anything make it worse?" (e.g., standing, change in head position)     Getting out of bed, sitting up 7. HEART RATE: "Can you tell me your heart rate?" "How many beats in 15 seconds?"  (Note: not all patients can do this)       no 8. CAUSE: "What do you think is causing the dizziness?"     Sinus problems 9. RECURRENT SYMPTOM: "Have you had dizziness before?" If so, ask: "When was the last time?" "What happened that time?"     sometimes 10. OTHER SYMPTOMS: "Do you have any other symptoms?" (e.g., fever, chest pain, vomiting, diarrhea,  bleeding)       no 11. PREGNANCY: "Is there any chance you are pregnant?" "When was your last menstrual period?"       n/a  Protocols used: DIZZINESS [facility]

## 2018-09-14 NOTE — Patient Instructions (Signed)
Labyrinthitis    Labyrinthitis is an infection of the inner ear. Your inner ear is made up of tubes and canals (labyrinth). These are filled with fluid. There are nerve cells in your inner ear that send hearing and balance signals to your brain. When germs get inside the tubes and canals, they harm the nerve cells that send signals to the brain.  This condition is often caused by a virus. You may have:   Dizziness.   Ringing in the ears.   Loss of hearing.   Loss of balance.   Stomach upset.   Throwing up.  Follow these instructions at home:  Medicines     Take over-the-counter and prescription medicines only as told by your doctor.   If you were given an antibiotic medicine, take it as told by your doctor. Do not stop taking the antibiotic even if you start to feel better.  Activity   Rest as told by your doctor.   Limit the things you do as told. Ask your doctor what is safe for you to do.   Do not make any sudden movements until you no longer feel dizzy.   Do physical therapy as told by your doctor.  General instructions   Avoid loud noises and bright lights.   Do not drive until your doctor says that it is safe for you.   Drink enough fluid to keep your pee (urine) pale yellow.   Keep all follow-up visits as told by your doctor. This is important.  Contact a doctor if:   Your symptoms do not get better.   You do not get better after two weeks.   You have a fever.  Get help right away if:   You are very dizzy.   You keep throwing up.   You keep feeling sick to your stomach.   Your hearing gets much worse all of a sudden.  Summary   Labyrinthitis is an infection of the inner ear.   This condition is often caused by a virus. Symptoms include dizziness, hearing loss, and ringing in the ears.   Treatment depends on the cause. Follow what your doctor tells you.  This information is not intended to replace advice given to you by your health care provider. Make sure you discuss any questions  you have with your health care provider.  Document Released: 07/01/2005 Document Revised: 07/12/2017 Document Reviewed: 07/12/2017  [employer]vier Interactive Patient Education  2019 [employer]

## 2018-09-14 NOTE — Progress Notes (Signed)
Subjective:  Patient ID: Loni Beckwith, female    DOB: Apr 13, 1941  Age: 78 y.o. MRN: [id number]  CC: Hypertension and URI   HPI MARCENA DIAS presents for a 3-day history of dizziness and vertigo.  She has had these symptoms before and they were previously treated with an oral medication.  She also complains of nasal congestion, runny nose, and postnasal drip.  She has chronic ataxia and numbness in both feet but says the symptoms have not worsened in the last few days.  Outpatient Medications Prior to Visit  Medication Sig Dispense Refill  . alendronate (FOSAMAX) 70 MG tablet TAKE 1 TABLET (70 MG TOTAL) BY MOUTH ONCE A WEEK ON SUNDAY 12 tablet 3  . aspirin 81 MG EC tablet Take 81 mg by mouth daily.      Marland Kitchen atenolol-chlorthalidone (TENORETIC) 50-25 MG tablet Take 1 tablet by mouth daily. 90 tablet 3  . blood glucose meter kit and supplies KIT Dispense with patient  Insurance; Use up to four times daily as directed. E11.9 1 each 0  . Blood Glucose Monitoring Suppl (ONE TOUCH ULTRA 2) w/Device KIT Use as directed up to 4 times per day E11.9 1 each 0  . Calcium Carb-Cholecalciferol (CALCIUM 600+D3) 600-800 MG-UNIT TABS Take 1 tablet by mouth every evening.    . cetirizine (ZYRTEC) 10 MG tablet Take 1 tablet (10 mg total) by mouth daily. (Patient taking differently: Take 10 mg by mouth daily as needed for allergies or rhinitis. ) 90 tablet 3  . Co-Enzyme Q-10 100 MG CAPS Take 100 mg by mouth daily.    . fluticasone (FLONASE) 50 MCG/ACT nasal spray Place 2 sprays into both nostrils daily. 48 g 3  . glimepiride (AMARYL) 4 MG tablet Take 1 tablet (4 mg total) by mouth 2 (two) times daily. 180 tablet 3  . glucose blood (ONE TOUCH ULTRA TEST) test strip Use as instructed up to 4 times per day  E11.9 100 each 12  . Lancets MISC Use as directed up to four times per day E11.9 200 each 11  . levothyroxine (SYNTHROID, LEVOTHROID) 175 MCG tablet Take 1 tablet (175 mcg total) by mouth daily before  breakfast. 90 tablet 3  . meloxicam (MOBIC) 7.5 MG tablet Take 1 tablet (7.5 mg total) by mouth daily as needed for pain. 90 tablet 3  . metFORMIN (GLUCOPHAGE) 1000 MG tablet Take 1 tablet (1,000 mg total) by mouth 2 (two) times daily with a meal. 180 tablet 3  . omeprazole (PRILOSEC) 40 MG capsule TAKE ONE CAPSULE BY MOUTH 30 MINUTES BEFORE BREAKFAST AND DINNER 180 capsule 3  . pioglitazone (ACTOS) 15 MG tablet Take 1 tablet (15 mg total) by mouth daily. 90 tablet 3  . Red Yeast Rice Extract 600 MG CAPS Take 1,200 mg by mouth 2 (two) times daily.    . traMADol (ULTRAM) 50 MG tablet Take 1 tablet (50 mg total) by mouth every 8 (eight) hours as needed. 90 tablet 5  . tiZANidine (ZANAFLEX) 2 MG tablet Take 1 tablet (2 mg total) by mouth every 6 (six) hours as needed for muscle spasms. 40 tablet 0   No facility-administered medications prior to visit.     ROS Review of Systems  Constitutional: Negative.  Negative for chills, fatigue and fever.  HENT: Positive for congestion, postnasal drip and rhinorrhea. Negative for facial swelling, sinus pressure, sinus pain, sore throat, trouble swallowing and voice change.   Eyes: Negative.  Negative for photophobia and visual disturbance.  Respiratory: Negative for cough, chest tightness and wheezing.   Cardiovascular: Negative for chest pain, palpitations and leg swelling.  Gastrointestinal: Negative for abdominal pain, diarrhea, nausea and vomiting.  Endocrine: Negative.   Genitourinary: Negative.  Negative for difficulty urinating.  Musculoskeletal: Negative.  Negative for back pain, myalgias and neck pain.  Skin: Negative.  Negative for rash.  Neurological: Positive for dizziness and numbness. Negative for tremors, seizures, syncope, facial asymmetry, speech difficulty, weakness, light-headedness and headaches.  Hematological: Does not bruise/bleed easily.  Psychiatric/Behavioral: Negative.  Negative for sleep disturbance. The patient is not  nervous/anxious.     Objective:  BP 136/76 (BP Location: Left Arm, Patient Position: Sitting, Cuff Size: Large)   Pulse 76   Temp 97.8 F (36.6 C) (Oral)   Resp 16   Ht '5\' 4"'  (1.626 m)   Wt 234 lb (106.1 kg)   SpO2 96%   BMI 40.17 kg/m   BP Readings from Last 3 Encounters:  09/14/18 136/76  08/07/18 (!) 144/86  07/24/18 136/70    Wt Readings from Last 3 Encounters:  09/14/18 234 lb (106.1 kg)  08/07/18 238 lb (108 kg)  07/24/18 241 lb (109.3 kg)    Physical Exam Constitutional:      Appearance: She is obese. She is not ill-appearing.  HENT:     Right Ear: Hearing, tympanic membrane and ear canal normal. No middle ear effusion. There is no impacted cerumen.     Left Ear: Hearing, tympanic membrane and ear canal normal.  No middle ear effusion. There is no impacted cerumen.     Nose: Nose normal. No congestion or rhinorrhea.     Mouth/Throat:     Mouth: Mucous membranes are moist.     Pharynx: Oropharynx is clear. No oropharyngeal exudate or posterior oropharyngeal erythema.  Eyes:     General: No scleral icterus.       Left eye: No discharge.     Extraocular Movements: Extraocular movements intact.     Conjunctiva/sclera: Conjunctivae normal.     Pupils: Pupils are equal, round, and reactive to light.  Neck:     Musculoskeletal: Normal range of motion and neck supple. No neck rigidity.  Cardiovascular:     Rate and Rhythm: Normal rate and regular rhythm.     Heart sounds: No murmur.  Pulmonary:     Effort: Pulmonary effort is normal.     Breath sounds: Normal breath sounds. No stridor. No wheezing, rhonchi or rales.  Abdominal:     General: Abdomen is flat.     Palpations: There is no mass.     Tenderness: There is no abdominal tenderness. There is no guarding.  Musculoskeletal: Normal range of motion.        General: No swelling.     Right lower leg: No edema.     Left lower leg: No edema.  Skin:    General: Skin is warm and dry.  Neurological:     Mental  Status: She is oriented to person, place, and time. Mental status is at baseline.     Cranial Nerves: No cranial nerve deficit.     Motor: No weakness or tremor.     Coordination: Coordination normal.     Gait: Gait normal.     Deep Tendon Reflexes: Reflexes normal. Babinski sign absent on the right side.     Reflex Scores:      Tricep reflexes are 0 on the right side and 0 on the left side.  Bicep reflexes are 0 on the right side and 0 on the left side.      Brachioradialis reflexes are 0 on the right side and 0 on the left side.      Patellar reflexes are 0 on the right side and 0 on the left side.      Achilles reflexes are 0 on the right side and 0 on the left side. Psychiatric:        Mood and Affect: Mood normal.        Behavior: Behavior normal.        Thought Content: Thought content normal.        Judgment: Judgment normal.     Lab Results  Component Value Date   WBC 5.6 07/17/2018   HGB 13.2 07/17/2018   HCT 39.3 07/17/2018   PLT 163.0 07/17/2018   GLUCOSE 113 (H) 07/17/2018   CHOL 169 07/17/2018   TRIG 110.0 07/17/2018   HDL 76.10 07/17/2018   LDLDIRECT 107.5 07/21/2013   LDLCALC 71 07/17/2018   ALT 25 07/17/2018   AST 30 07/17/2018   NA 139 07/17/2018   K 3.7 07/17/2018   CL 101 07/17/2018   CREATININE 0.93 07/17/2018   BUN 30 (H) 07/17/2018   CO2 27 07/17/2018   TSH 0.64 07/17/2018   INR 1.1 (H) 05/15/2018   HGBA1C 7.1 (H) 07/17/2018   MICROALBUR 1.4 07/17/2018    Dg Chest 2 View  Addendum Date: 08/07/2018   ADDENDUM REPORT: 08/07/2018 11:45 ADDENDUM: Upon review of the rib films, the rib findings on the chest x-ray are actually secondary to absence of a segment of the ninth rib, possibly postsurgical. Recommend clinical correlation. Electronically Signed   By: Dorise Bullion III M.D   On: 08/07/2018 11:45   Result Date: 08/07/2018 CLINICAL DATA:  Fall 4 days ago.  Left anterior rib pain. EXAM: CHEST - 2 VIEW COMPARISON:  August 07, 2018 FINDINGS:  Pedicle rods and screws in the lower thoracic and upper lumbar spine. The heart size borderline. The hila and mediastinum are normal. No pulmonary nodules or masses. Possible lower left anterolateral rib fracture. No pneumothorax. IMPRESSION: 1. Suspected poorly visualized fracture of a lower anterolateral ribs without pneumothorax. Dedicated rib films could confirm. 2. No other acute abnormalities. Electronically Signed: By: Dorise Bullion III M.D On: 08/07/2018 11:34   Dg Ribs Unilateral Left  Result Date: 08/07/2018 CLINICAL DATA:  Pain after fall EXAM: LEFT RIBS - 2 VIEW COMPARISON:  July 24, 2018 and August 07, 2018 chest x-rays FINDINGS: A section of the ninth anterolateral left rib appears to be absent. This was thought to represent a fracture on the chest x-ray from earlier today. However, in retrospect, this is been present since at least January 2016 with sharp margins, likely postsurgical. No convincing evidence of fracture. IMPRESSION: 1. A section of the ninth anterolateral left rib is apparently absent, probably postsurgical given appearance. Recommend clinical correlation. 2. No acute fracture seen. Electronically Signed   By: Dorise Bullion III M.D   On: 08/07/2018 11:42    Assessment & Plan:   Nastacia was seen today for hypertension and uri.  Diagnoses and all orders for this visit:  Viral labyrinthitis, bilateral- She has a history of vertigo with a recent exacerbation from a viral upper respiratory infection.  I recommended that she try a 5-day course of prednisone and meclizine to help reduce the symptoms.  There is no evidence of a neurological event so I did not recommend CNS  imaging. -     predniSONE (DELTASONE) 20 MG tablet; Take 1 tablet (20 mg total) by mouth 2 (two) times daily with a meal for 5 days. -     meclizine (ANTIVERT) 12.5 MG tablet; Take 1 tablet (12.5 mg total) by mouth 3 (three) times daily as needed for dizziness.   I have discontinued Joycelyn Schmid T.  Parmelee "Peggy"'s tiZANidine. I am also having her start on predniSONE and meclizine. Additionally, I am having her maintain her aspirin, cetirizine, Calcium Carb-Cholecalciferol, Red Yeast Rice Extract, Co-Enzyme Q-10, pioglitazone, metFORMIN, levothyroxine, glimepiride, fluticasone, atenolol-chlorthalidone, meloxicam, omeprazole, alendronate, traMADol, blood glucose meter kit and supplies, glucose blood, ONE TOUCH ULTRA 2, and Lancets.  Meds ordered this encounter  Medications  . predniSONE (DELTASONE) 20 MG tablet    Sig: Take 1 tablet (20 mg total) by mouth 2 (two) times daily with a meal for 5 days.    Dispense:  10 tablet    Refill:  0  . meclizine (ANTIVERT) 12.5 MG tablet    Sig: Take 1 tablet (12.5 mg total) by mouth 3 (three) times daily as needed for dizziness.    Dispense:  90 tablet    Refill:  1     Follow-up: Return if symptoms worsen or fail to improve.  Scarlette Calico, MD

## 2018-09-15 DIAGNOSIS — L84 Corns and callosities: Secondary | ICD-10-CM | POA: Diagnosis not present

## 2018-09-15 DIAGNOSIS — M79672 Pain in left foot: Secondary | ICD-10-CM | POA: Diagnosis not present

## 2018-09-15 DIAGNOSIS — R2689 Other abnormalities of gait and mobility: Secondary | ICD-10-CM | POA: Diagnosis not present

## 2018-09-15 DIAGNOSIS — M21962 Unspecified acquired deformity of left lower leg: Secondary | ICD-10-CM | POA: Diagnosis not present

## 2018-09-15 DIAGNOSIS — E1151 Type 2 diabetes mellitus with diabetic peripheral angiopathy without gangrene: Secondary | ICD-10-CM | POA: Diagnosis not present

## 2018-09-15 DIAGNOSIS — M6281 Muscle weakness (generalized): Secondary | ICD-10-CM | POA: Diagnosis not present

## 2018-09-15 DIAGNOSIS — M21961 Unspecified acquired deformity of right lower leg: Secondary | ICD-10-CM | POA: Diagnosis not present

## 2018-09-15 DIAGNOSIS — M545 Low back pain: Secondary | ICD-10-CM | POA: Diagnosis not present

## 2018-09-17 DIAGNOSIS — M6281 Muscle weakness (generalized): Secondary | ICD-10-CM | POA: Diagnosis not present

## 2018-09-17 DIAGNOSIS — M545 Low back pain: Secondary | ICD-10-CM | POA: Diagnosis not present

## 2018-09-17 DIAGNOSIS — R2689 Other abnormalities of gait and mobility: Secondary | ICD-10-CM | POA: Diagnosis not present

## 2018-09-21 DIAGNOSIS — L853 Xerosis cutis: Secondary | ICD-10-CM | POA: Diagnosis not present

## 2018-09-21 DIAGNOSIS — L570 Actinic keratosis: Secondary | ICD-10-CM | POA: Diagnosis not present

## 2018-09-22 DIAGNOSIS — M6281 Muscle weakness (generalized): Secondary | ICD-10-CM | POA: Diagnosis not present

## 2018-09-22 DIAGNOSIS — M545 Low back pain: Secondary | ICD-10-CM | POA: Diagnosis not present

## 2018-09-22 DIAGNOSIS — R2689 Other abnormalities of gait and mobility: Secondary | ICD-10-CM | POA: Diagnosis not present

## 2018-09-24 DIAGNOSIS — M545 Low back pain: Secondary | ICD-10-CM | POA: Diagnosis not present

## 2018-09-24 DIAGNOSIS — R2689 Other abnormalities of gait and mobility: Secondary | ICD-10-CM | POA: Diagnosis not present

## 2018-09-24 DIAGNOSIS — M6281 Muscle weakness (generalized): Secondary | ICD-10-CM | POA: Diagnosis not present

## 2018-10-04 ENCOUNTER — Other Ambulatory Visit: Payer: Self-pay | Admitting: Internal Medicine

## 2018-10-05 ENCOUNTER — Other Ambulatory Visit: Payer: Self-pay | Admitting: Internal Medicine

## 2018-11-07 DIAGNOSIS — R69 Illness, unspecified: Secondary | ICD-10-CM | POA: Diagnosis not present

## 2018-11-09 DIAGNOSIS — G5703 Lesion of sciatic nerve, bilateral lower limbs: Secondary | ICD-10-CM | POA: Diagnosis not present

## 2018-12-28 DIAGNOSIS — M47816 Spondylosis without myelopathy or radiculopathy, lumbar region: Secondary | ICD-10-CM | POA: Diagnosis not present

## 2018-12-28 DIAGNOSIS — G894 Chronic pain syndrome: Secondary | ICD-10-CM | POA: Diagnosis not present

## 2018-12-28 DIAGNOSIS — G5703 Lesion of sciatic nerve, bilateral lower limbs: Secondary | ICD-10-CM | POA: Diagnosis not present

## 2019-01-01 ENCOUNTER — Telehealth: Payer: Self-pay

## 2019-01-01 DIAGNOSIS — K7460 Unspecified cirrhosis of liver: Secondary | ICD-10-CM

## 2019-01-01 NOTE — Telephone Encounter (Signed)
The pt was advised she is due for repeat US and AFP.  Order for AFP is in the sytem.  She prefers to wait to set up [place] and will call when she is ready.

## 2019-01-01 NOTE — Telephone Encounter (Signed)
-----   Message from Timothy Lasso, RN sent at 12/28/2018  9:08 AM EDT -----  ----- Message ----- From: Timothy Lasso, RN Sent: 12/23/2018 To: Timothy Lasso, RN   ----- Message ----- From: Timothy Lasso, RN Sent: 11/17/2018 To: Timothy Lasso, RN  [place] and AFP in 6 months.

## 2019-01-04 ENCOUNTER — Other Ambulatory Visit: Payer: Self-pay | Admitting: Internal Medicine

## 2019-01-04 ENCOUNTER — Telehealth: Payer: Self-pay | Admitting: Gastroenterology

## 2019-01-04 DIAGNOSIS — K7460 Unspecified cirrhosis of liver: Secondary | ICD-10-CM

## 2019-01-04 NOTE — Telephone Encounter (Signed)
Patient return call ready to schedule.

## 2019-01-04 NOTE — Telephone Encounter (Signed)
The pt has been given the [place] and Lab information.  The pt has been advised of the information and verbalized understanding.     You have been scheduled for an abdominal ultrasound at [facility] (1st floor of hospital) on 01/07/19 at 830am. Please arrive 15 minutes prior to your appointment for registration. Make certain not to have anything to eat or drink 6 hours prior to your appointment. Should you need to reschedule your appointment, please contact radiology at [phone]. This test typically takes about 30 minutes to perform.

## 2019-01-07 ENCOUNTER — Other Ambulatory Visit: Payer: Self-pay

## 2019-01-07 ENCOUNTER — Ambulatory Visit (HOSPITAL_COMMUNITY)
Admission: RE | Admit: 2019-01-07 | Discharge: 2019-01-07 | Disposition: A | Discharge status: Home / Self Care | Payer: Medicare HMO | Source: Ambulatory Visit | Attending: Gastroenterology | Admitting: Gastroenterology

## 2019-01-07 ENCOUNTER — Other Ambulatory Visit (INDEPENDENT_AMBULATORY_CARE_PROVIDER_SITE_OTHER): Payer: Medicare HMO

## 2019-01-07 ENCOUNTER — Other Ambulatory Visit: Payer: Medicare HMO

## 2019-01-07 DIAGNOSIS — K7460 Unspecified cirrhosis of liver: Secondary | ICD-10-CM | POA: Insufficient documentation

## 2019-01-07 DIAGNOSIS — K8020 Calculus of gallbladder without cholecystitis without obstruction: Secondary | ICD-10-CM | POA: Diagnosis not present

## 2019-01-07 DIAGNOSIS — E119 Type 2 diabetes mellitus without complications: Secondary | ICD-10-CM | POA: Diagnosis not present

## 2019-01-07 LAB — LIPID PANEL
Cholesterol: 157 mg/dL (ref 0–200)
HDL: 71 mg/dL (ref >39.00)
LDL Cholesterol: 68 mg/dL (ref 0–99)
NonHDL: 86.43
Total CHOL/HDL Ratio: 2
Triglycerides: 92 mg/dL (ref 0.0–149.0)
VLDL: 18.4 mg/dL (ref 0.0–40.0)

## 2019-01-07 LAB — HEPATIC FUNCTION PANEL
ALT: 24 U/L (ref 0–35)
AST: 33 U/L (ref 0–37)
Albumin: 4 g/dL (ref 3.5–5.2)
Alkaline Phosphatase: 71 U/L (ref 39–117)
Bilirubin, Direct: 0.3 mg/dL (ref 0.0–0.3)
Total Bilirubin: 1.2 mg/dL (ref 0.2–1.2)
Total Protein: 6.7 g/dL (ref 6.0–8.3)

## 2019-01-07 LAB — HEMOGLOBIN A1C: Hgb A1c MFr Bld: 6.6 % — ABNORMAL HIGH (ref 4.6–6.5)

## 2019-01-07 LAB — BASIC METABOLIC PANEL
BUN: 26 mg/dL — ABNORMAL HIGH (ref 6–23)
CO2: 31 mEq/L (ref 19–32)
Calcium: 10.1 mg/dL (ref 8.4–10.5)
Chloride: 101 mEq/L (ref 96–112)
Creatinine, Ser: 0.91 mg/dL (ref 0.40–1.20)
GFR: 60.07 mL/min (ref >60.00)
Glucose, Bld: 109 mg/dL — ABNORMAL HIGH (ref 70–99)
Potassium: 3.9 mEq/L (ref 3.5–5.1)
Sodium: 140 mEq/L (ref 135–145)

## 2019-01-08 LAB — AFP TUMOR MARKER: AFP-Tumor Marker: 2.4 ng/mL

## 2019-01-20 DIAGNOSIS — G5703 Lesion of sciatic nerve, bilateral lower limbs: Secondary | ICD-10-CM | POA: Diagnosis not present

## 2019-01-26 ENCOUNTER — Encounter: Payer: Self-pay | Admitting: Internal Medicine

## 2019-01-26 ENCOUNTER — Ambulatory Visit (INDEPENDENT_AMBULATORY_CARE_PROVIDER_SITE_OTHER): Payer: Medicare HMO | Admitting: Internal Medicine

## 2019-01-26 ENCOUNTER — Other Ambulatory Visit: Payer: Self-pay

## 2019-01-26 VITALS — BP 112/68 | HR 65 | Temp 98.2°F | Ht 64.0 in | Wt 233.0 lb

## 2019-01-26 DIAGNOSIS — K2970 Gastritis, unspecified, without bleeding: Secondary | ICD-10-CM

## 2019-01-26 DIAGNOSIS — Z23 Encounter for immunization: Secondary | ICD-10-CM

## 2019-01-26 DIAGNOSIS — R1013 Epigastric pain: Secondary | ICD-10-CM | POA: Diagnosis not present

## 2019-01-26 DIAGNOSIS — E119 Type 2 diabetes mellitus without complications: Secondary | ICD-10-CM

## 2019-01-26 DIAGNOSIS — K269 Duodenal ulcer, unspecified as acute or chronic, without hemorrhage or perforation: Secondary | ICD-10-CM | POA: Diagnosis not present

## 2019-01-26 DIAGNOSIS — K2990 Gastroduodenitis, unspecified, without bleeding: Secondary | ICD-10-CM

## 2019-01-26 DIAGNOSIS — E538 Deficiency of other specified B group vitamins: Secondary | ICD-10-CM | POA: Diagnosis not present

## 2019-01-26 DIAGNOSIS — E559 Vitamin D deficiency, unspecified: Secondary | ICD-10-CM

## 2019-01-26 DIAGNOSIS — N183 Chronic kidney disease, stage 3 unspecified: Secondary | ICD-10-CM

## 2019-01-26 DIAGNOSIS — E785 Hyperlipidemia, unspecified: Secondary | ICD-10-CM

## 2019-01-26 DIAGNOSIS — I10 Essential (primary) hypertension: Secondary | ICD-10-CM | POA: Diagnosis not present

## 2019-01-26 DIAGNOSIS — E611 Iron deficiency: Secondary | ICD-10-CM | POA: Diagnosis not present

## 2019-01-26 DIAGNOSIS — Z0000 Encounter for general adult medical examination without abnormal findings: Secondary | ICD-10-CM

## 2019-01-26 DIAGNOSIS — M810 Age-related osteoporosis without current pathological fracture: Secondary | ICD-10-CM

## 2019-01-26 MED ORDER — TRAMADOL HCL 50 MG PO TABS: 50.0000 mg | ORAL_TABLET | Freq: Three times a day (TID) | ORAL | 5 refills | Status: DC | PRN

## 2019-01-26 MED ORDER — CHLORTHALIDONE 25 MG PO TABS: 25.0000 mg | ORAL_TABLET | Freq: Every day | ORAL | 3 refills | Status: DC

## 2019-01-26 MED ORDER — OMEPRAZOLE 40 MG PO CPDR: DELAYED_RELEASE_CAPSULE | ORAL | 3 refills | Status: DC

## 2019-01-26 MED ORDER — ATENOLOL 50 MG PO TABS: 50.0000 mg | ORAL_TABLET | Freq: Every day | ORAL | 3 refills | Status: DC

## 2019-01-26 NOTE — Patient Instructions (Signed)
OK to stop the fosamax  OK to change the combination blood pressure medication to the separate pills (same dose)  Please continue all other medications as before, and refills have been done if requested.  Please have the pharmacy call with any other refills you may need.  Please continue your efforts at being more active, low cholesterol diet, and weight control.  You are otherwise up to date with prevention measures today.  Please keep your appointments with your specialists as you may have planned  Please return in 6 months, or sooner if needed, with Lab testing done 3-5 days before

## 2019-01-26 NOTE — Progress Notes (Signed)
Subjective:    Patient ID: Kelsey Keller, female    DOB: 12-31-1940, 78 y.o.   MRN: [id number]  HPI  Here to f/u; overall doing ok,  Pt denies chest pain, increasing sob or doe, wheezing, orthopnea, PND, increased LE swelling, palpitations, dizziness or syncope.  Pt denies new neurological symptoms such as new headache, or facial or extremity weakness or numbness.  Pt denies polydipsia, polyuria, or low sugar episode.  Pt states overall good compliance with meds, mostly trying to follow appropriate diet, with wt overall stable,  but little exercise however.  Currently with cane but uses walker with increased back pain, now better after ESI.   Has been on fosamax for > 5 yrs Past Medical History:  Diagnosis Date  . ALLERGIC RHINITIS 04/29/2007  . Allergy   . Cataract    both eyes  . DIABETES MELLITUS, TYPE II 06/23/2007   type 2  . Elevated liver enzymes 03/2017  . Elevated tumor markers   . GERD (gastroesophageal reflux disease)   . HIATAL HERNIA, HX OF 04/29/2007  . HYPERLIPIDEMIA 04/29/2007   diet controlled on CoQ10  . HYPERTENSION 04/29/2007  . HYPOKALEMIA 05/02/2009  . HYPOTHYROIDISM 04/29/2007  . LOW BACK PAIN 04/29/2007  . Neuromuscular disorder ([place])    diabetic neuropathy in both legs  . OSTEOARTHRITIS, KNEE, LEFT 06/23/2007  . OSTEOPOROSIS NOS 05/12/2007  . SPINAL STENOSIS, LUMBAR 04/29/2007   Past Surgical History:  Procedure Laterality Date  . BACK SURGERY N/A 2014 x 2   T1 TO S 1   . CATARACT EXTRACTION W/ INTRAOCULAR LENS  IMPLANT, BILATERAL Bilateral 05/2011   doing well  . COLONOSCOPY WITH PROPOFOL N/A 04/10/2017   Procedure: COLONOSCOPY WITH PROPOFOL;  Surgeon: [name], MD;  Location: [facility];  Service: Endoscopy;  Laterality: N/A;  . COLONSCOPY  2011  . TOTAL KNEE ARTHROPLASTY  2006   right  . TOTAL KNEE ARTHROPLASTY Left 2012    reports that she has never smoked. She has never used smokeless tobacco. She reports current alcohol use. She  reports that she does not use drugs. family history includes Arthritis in her brother and mother; COPD in her brother; Colon polyps in an other family member; Coronary artery disease in her father and another family member; Diabetes in an other family member; Hyperlipidemia in her brother; Hypertension in her mother; Stroke in her brother. No Known Allergies Current Outpatient Medications on File Prior to Visit  Medication Sig Dispense Refill  . aspirin 81 MG EC tablet Take 81 mg by mouth daily.      . blood glucose meter kit and supplies KIT Dispense with patient  Insurance; Use up to four times daily as directed. E11.9 1 each 0  . Blood Glucose Monitoring Suppl (ONE TOUCH ULTRA 2) w/Device KIT Use as directed up to 4 times per day E11.9 1 each 0  . Calcium Carb-Cholecalciferol (CALCIUM 600+D3) 600-800 MG-UNIT TABS Take 1 tablet by mouth every evening.    . cetirizine (ZYRTEC) 10 MG tablet Take 1 tablet (10 mg total) by mouth daily. (Patient taking differently: Take 10 mg by mouth daily as needed for allergies or rhinitis. ) 90 tablet 3  . Co-Enzyme Q-10 100 MG CAPS Take 100 mg by mouth daily.    . fluticasone (FLONASE) 50 MCG/ACT nasal spray Place 2 sprays into both nostrils daily. 48 g 3  . glimepiride (AMARYL) 4 MG tablet TAKE 1 TABLET (4 MG TOTAL) BY MOUTH 2 (TWO) TIMES DAILY 180  tablet 3  . glucose blood (ONE TOUCH ULTRA TEST) test strip Use as instructed up to 4 times per day  E11.9 100 each 12  . Lancets MISC Use as directed up to four times per day E11.9 200 each 11  . levothyroxine (SYNTHROID, LEVOTHROID) 175 MCG tablet TAKE 1 TABLET (175 MCG TOTAL) BY MOUTH DAILY BEFORE BREAKFAST. 90 tablet 3  . meclizine (ANTIVERT) 12.5 MG tablet Take 1 tablet (12.5 mg total) by mouth 3 (three) times daily as needed for dizziness. 90 tablet 1  . meloxicam (MOBIC) 7.5 MG tablet TAKE 1 TABLET BY MOUTH EVERY DAY AS NEEDED FOR PAIN 90 tablet 2  . metFORMIN (GLUCOPHAGE) 1000 MG tablet TAKE 1 TABLET (1,000 MG  TOTAL) BY MOUTH 2 (TWO) TIMES DAILY WITH A MEAL. 180 tablet 3  . pioglitazone (ACTOS) 15 MG tablet TAKE 1 TABLET BY MOUTH EVERY DAY 90 tablet 3  . Red Yeast Rice Extract 600 MG CAPS Take 1,200 mg by mouth 2 (two) times daily.     No current facility-administered medications on file prior to visit.    Review of Systems  Constitutional: Negative for other unusual diaphoresis or sweats HENT: Negative for ear discharge or swelling Eyes: Negative for other worsening visual disturbances Respiratory: Negative for stridor or other swelling  Gastrointestinal: Negative for worsening distension or other blood Genitourinary: Negative for retention or other urinary change Musculoskeletal: Negative for other MSK pain or swelling Skin: Negative for color change or other new lesions Neurological: Negative for worsening tremors and other numbness  Psychiatric/Behavioral: Negative for worsening agitation or other fatigue All other system neg per pt    Objective:   Physical Exam BP 112/68   Pulse 65   Temp 98.2 F (36.8 C) (Oral)   Ht '5\' 4"'  (1.626 m)   Wt 233 lb (105.7 kg)   SpO2 98%   BMI 39.99 kg/m  VS noted,  Constitutional: Pt appears in NAD HENT: Head: NCAT.  Right Ear: External ear normal.  Left Ear: External ear normal.  Eyes: . Pupils are equal, round, and reactive to light. Conjunctivae and EOM are normal Nose: without d/c or deformity Neck: Neck supple. Gross normal ROM Cardiovascular: Normal rate and regular rhythm.   Pulmonary/Chest: Effort normal and breath sounds without rales or wheezing.  Abd:  Soft, NT, ND, + BS, no organomegaly Neurological: Pt is alert. At baseline orientation, motor grossly intact Skin: Skin is warm. No rashes, other new lesions, no LE edema Psychiatric: Pt behavior is normal without agitation  No other exam findings Lab Results  Component Value Date   WBC 5.6 07/17/2018   HGB 13.2 07/17/2018   HCT 39.3 07/17/2018   PLT 163.0 07/17/2018   GLUCOSE  109 (H) 01/07/2019   CHOL 157 01/07/2019   TRIG 92.0 01/07/2019   HDL 71.00 01/07/2019   LDLDIRECT 107.5 07/21/2013   LDLCALC 68 01/07/2019   ALT 24 01/07/2019   AST 33 01/07/2019   NA 140 01/07/2019   K 3.9 01/07/2019   CL 101 01/07/2019   CREATININE 0.91 01/07/2019   BUN 26 (H) 01/07/2019   CO2 31 01/07/2019   TSH 0.64 07/17/2018   INR 1.1 (H) 05/15/2018   HGBA1C 6.6 (H) 01/07/2019   MICROALBUR 1.4 07/17/2018       Assessment & Plan:

## 2019-01-26 NOTE — Assessment & Plan Note (Signed)
stable overall by history and exam, recent data reviewed with pt, and pt to continue medical treatment as before,  to f/u any worsening symptoms or concerns  

## 2019-01-26 NOTE — Assessment & Plan Note (Signed)
For f/u vit d level

## 2019-01-26 NOTE — Assessment & Plan Note (Signed)
Ok to d/c the fosamax

## 2019-02-05 ENCOUNTER — Encounter: Payer: Self-pay | Admitting: Internal Medicine

## 2019-02-05 ENCOUNTER — Other Ambulatory Visit: Payer: Self-pay

## 2019-02-05 DIAGNOSIS — T7632XA Child psychological abuse, suspected, initial encounter: Secondary | ICD-10-CM

## 2019-02-05 DIAGNOSIS — Z20822 Contact with and (suspected) exposure to covid-19: Secondary | ICD-10-CM

## 2019-02-05 DIAGNOSIS — Z20828 Contact with and (suspected) exposure to other viral communicable diseases: Secondary | ICD-10-CM

## 2019-02-05 NOTE — Progress Notes (Signed)
n

## 2019-02-07 LAB — NOVEL CORONAVIRUS, NAA: SARS-CoV-2, NAA: NOT DETECTED

## 2019-02-16 DIAGNOSIS — G894 Chronic pain syndrome: Secondary | ICD-10-CM | POA: Diagnosis not present

## 2019-02-16 DIAGNOSIS — G5703 Lesion of sciatic nerve, bilateral lower limbs: Secondary | ICD-10-CM | POA: Diagnosis not present

## 2019-03-20 DIAGNOSIS — R69 Illness, unspecified: Secondary | ICD-10-CM | POA: Diagnosis not present

## 2019-04-14 DIAGNOSIS — G5703 Lesion of sciatic nerve, bilateral lower limbs: Secondary | ICD-10-CM | POA: Diagnosis not present

## 2019-05-18 ENCOUNTER — Other Ambulatory Visit: Payer: Self-pay | Admitting: Internal Medicine

## 2019-05-18 DIAGNOSIS — Z1231 Encounter for screening mammogram for malignant neoplasm of breast: Secondary | ICD-10-CM

## 2019-06-30 DIAGNOSIS — G5703 Lesion of sciatic nerve, bilateral lower limbs: Secondary | ICD-10-CM | POA: Diagnosis not present

## 2019-07-05 ENCOUNTER — Telehealth: Payer: Self-pay

## 2019-07-05 ENCOUNTER — Other Ambulatory Visit: Payer: Self-pay | Admitting: Gastroenterology

## 2019-07-05 DIAGNOSIS — K7460 Unspecified cirrhosis of liver: Secondary | ICD-10-CM

## 2019-07-07 ENCOUNTER — Ambulatory Visit: Payer: Medicare HMO | Attending: Internal Medicine

## 2019-07-07 DIAGNOSIS — Z20822 Contact with and (suspected) exposure to covid-19: Secondary | ICD-10-CM

## 2019-07-07 DIAGNOSIS — Z20828 Contact with and (suspected) exposure to other viral communicable diseases: Secondary | ICD-10-CM | POA: Diagnosis not present

## 2019-07-09 LAB — NOVEL CORONAVIRUS, NAA: SARS-CoV-2, NAA: NOT DETECTED

## 2019-07-12 ENCOUNTER — Ambulatory Visit
Admission: RE | Admit: 2019-07-12 | Discharge: 2019-07-12 | Disposition: A | Discharge status: Home / Self Care | Payer: Medicare HMO | Source: Ambulatory Visit | Attending: Internal Medicine | Admitting: Internal Medicine

## 2019-07-12 ENCOUNTER — Other Ambulatory Visit: Payer: Self-pay

## 2019-07-12 DIAGNOSIS — Z1231 Encounter for screening mammogram for malignant neoplasm of breast: Secondary | ICD-10-CM | POA: Diagnosis not present

## 2019-07-21 ENCOUNTER — Encounter: Payer: Self-pay | Admitting: Internal Medicine

## 2019-07-22 ENCOUNTER — Other Ambulatory Visit (INDEPENDENT_AMBULATORY_CARE_PROVIDER_SITE_OTHER): Payer: Medicare HMO

## 2019-07-22 DIAGNOSIS — E559 Vitamin D deficiency, unspecified: Secondary | ICD-10-CM

## 2019-07-22 DIAGNOSIS — E538 Deficiency of other specified B group vitamins: Secondary | ICD-10-CM | POA: Diagnosis not present

## 2019-07-22 DIAGNOSIS — E119 Type 2 diabetes mellitus without complications: Secondary | ICD-10-CM | POA: Diagnosis not present

## 2019-07-22 DIAGNOSIS — E611 Iron deficiency: Secondary | ICD-10-CM

## 2019-07-22 DIAGNOSIS — Z0000 Encounter for general adult medical examination without abnormal findings: Secondary | ICD-10-CM

## 2019-07-22 DIAGNOSIS — K7460 Unspecified cirrhosis of liver: Secondary | ICD-10-CM

## 2019-07-22 DIAGNOSIS — Z20822 Contact with and (suspected) exposure to covid-19: Secondary | ICD-10-CM

## 2019-07-22 LAB — IBC PANEL
Iron: 109 ug/dL (ref 42–145)
Saturation Ratios: 32.2 % (ref 20.0–50.0)
Transferrin: 242 mg/dL (ref 212.0–360.0)

## 2019-07-22 LAB — LIPID PANEL
Cholesterol: 166 mg/dL (ref 0–200)
HDL: 79.2 mg/dL (ref >39.00)
LDL Cholesterol: 72 mg/dL (ref 0–99)
NonHDL: 86.42
Total CHOL/HDL Ratio: 2
Triglycerides: 70 mg/dL (ref 0.0–149.0)
VLDL: 14 mg/dL (ref 0.0–40.0)

## 2019-07-22 LAB — MICROALBUMIN / CREATININE URINE RATIO
Creatinine,U: 176.7 mg/dL
Microalb Creat Ratio: 2.3 mg/g (ref 0.0–30.0)
Microalb, Ur: 4 mg/dL — ABNORMAL HIGH (ref 0.0–1.9)

## 2019-07-22 LAB — CBC WITH DIFFERENTIAL/PLATELET
Basophils Absolute: 0 10*3/uL (ref 0.0–0.1)
Basophils Relative: 0.7 % (ref 0.0–3.0)
Eosinophils Absolute: 0.2 10*3/uL (ref 0.0–0.7)
Eosinophils Relative: 3.8 % (ref 0.0–5.0)
HCT: 38.4 % (ref 36.0–46.0)
Hemoglobin: 12.9 g/dL (ref 12.0–15.0)
Lymphocytes Relative: 19 % (ref 12.0–46.0)
Lymphs Abs: 0.9 10*3/uL (ref 0.7–4.0)
MCHC: 33.5 g/dL (ref 30.0–36.0)
MCV: 94.6 fl (ref 78.0–100.0)
Monocytes Absolute: 0.4 10*3/uL (ref 0.1–1.0)
Monocytes Relative: 8.3 % (ref 3.0–12.0)
Neutro Abs: 3.2 10*3/uL (ref 1.4–7.7)
Neutrophils Relative %: 68.2 % (ref 43.0–77.0)
Platelets: 132 10*3/uL — ABNORMAL LOW (ref 150.0–400.0)
RBC: 4.06 Mil/uL (ref 3.87–5.11)
RDW: 14.1 % (ref 11.5–15.5)
WBC: 4.7 10*3/uL (ref 4.0–10.5)

## 2019-07-22 LAB — VITAMIN D 25 HYDROXY (VIT D DEFICIENCY, FRACTURES): VITD: 39.94 ng/mL (ref 30.00–100.00)

## 2019-07-22 LAB — URINALYSIS, ROUTINE W REFLEX MICROSCOPIC
Bilirubin Urine: NEGATIVE
Hgb urine dipstick: NEGATIVE
Ketones, ur: NEGATIVE
Nitrite: NEGATIVE
RBC / HPF: NONE SEEN (ref 0–?)
Specific Gravity, Urine: 1.025 (ref 1.000–1.030)
Total Protein, Urine: NEGATIVE
Urine Glucose: NEGATIVE
Urobilinogen, UA: 0.2 (ref 0.0–1.0)
pH: 6 (ref 5.0–8.0)

## 2019-07-22 LAB — PROTIME-INR
INR: 1 ratio (ref 0.8–1.0)
Prothrombin Time: 12.1 s (ref 9.6–13.1)

## 2019-07-22 LAB — BASIC METABOLIC PANEL
BUN: 33 mg/dL — ABNORMAL HIGH (ref 6–23)
CO2: 30 mEq/L (ref 19–32)
Calcium: 10.2 mg/dL (ref 8.4–10.5)
Chloride: 103 mEq/L (ref 96–112)
Creatinine, Ser: 0.98 mg/dL (ref 0.40–1.20)
GFR: 55.07 mL/min — ABNORMAL LOW (ref >60.00)
Glucose, Bld: 106 mg/dL — ABNORMAL HIGH (ref 70–99)
Potassium: 3.6 mEq/L (ref 3.5–5.1)
Sodium: 140 mEq/L (ref 135–145)

## 2019-07-22 LAB — HEPATIC FUNCTION PANEL
ALT: 28 U/L (ref 0–35)
AST: 29 U/L (ref 0–37)
Albumin: 3.9 g/dL (ref 3.5–5.2)
Alkaline Phosphatase: 74 U/L (ref 39–117)
Bilirubin, Direct: 0.3 mg/dL (ref 0.0–0.3)
Total Bilirubin: 1.1 mg/dL (ref 0.2–1.2)
Total Protein: 6.4 g/dL (ref 6.0–8.3)

## 2019-07-22 LAB — HEMOGLOBIN A1C: Hgb A1c MFr Bld: 7.1 % — ABNORMAL HIGH (ref 4.6–6.5)

## 2019-07-22 LAB — VITAMIN B12: Vitamin B-12: 1482 pg/mL — ABNORMAL HIGH (ref 211–911)

## 2019-07-22 LAB — TSH: TSH: 0.17 u[IU]/mL — ABNORMAL LOW (ref 0.35–4.50)

## 2019-07-22 NOTE — Telephone Encounter (Signed)
Per chart pt has came in this am to do labs that was previously order by MD../lmb

## 2019-07-23 LAB — AFP TUMOR MARKER: AFP-Tumor Marker: 2.4 ng/mL

## 2019-07-27 ENCOUNTER — Other Ambulatory Visit: Payer: Self-pay

## 2019-07-27 ENCOUNTER — Ambulatory Visit (HOSPITAL_COMMUNITY)
Admission: RE | Admit: 2019-07-27 | Discharge: 2019-07-27 | Disposition: A | Discharge status: Home / Self Care | Payer: Medicare HMO | Source: Ambulatory Visit | Attending: Gastroenterology | Admitting: Gastroenterology

## 2019-07-27 DIAGNOSIS — K7460 Unspecified cirrhosis of liver: Secondary | ICD-10-CM | POA: Insufficient documentation

## 2019-07-27 DIAGNOSIS — K8020 Calculus of gallbladder without cholecystitis without obstruction: Secondary | ICD-10-CM | POA: Diagnosis not present

## 2019-07-29 ENCOUNTER — Ambulatory Visit (INDEPENDENT_AMBULATORY_CARE_PROVIDER_SITE_OTHER): Payer: Medicare HMO | Admitting: Internal Medicine

## 2019-07-29 ENCOUNTER — Encounter: Payer: Self-pay | Admitting: Internal Medicine

## 2019-07-29 ENCOUNTER — Other Ambulatory Visit: Payer: Self-pay

## 2019-07-29 VITALS — BP 140/70 | HR 80 | Temp 98.8°F | Ht 64.0 in | Wt 228.0 lb

## 2019-07-29 DIAGNOSIS — E119 Type 2 diabetes mellitus without complications: Secondary | ICD-10-CM | POA: Diagnosis not present

## 2019-07-29 DIAGNOSIS — E039 Hypothyroidism, unspecified: Secondary | ICD-10-CM

## 2019-07-29 DIAGNOSIS — Z0000 Encounter for general adult medical examination without abnormal findings: Secondary | ICD-10-CM

## 2019-07-29 DIAGNOSIS — I499 Cardiac arrhythmia, unspecified: Secondary | ICD-10-CM | POA: Diagnosis not present

## 2019-07-29 MED ORDER — LEVOTHYROXINE SODIUM 150 MCG PO TABS: 150.0000 ug | ORAL_TABLET | Freq: Every day | ORAL | 3 refills | Status: DC

## 2019-07-29 NOTE — Patient Instructions (Addendum)
You had the Tdap tetanus shot today  Your EKG was OK today  OK to decrease the levothyroxine to 147mcg per day  Please return for LAB ONLY in 4 wks (to the first floor - to make appt today) for repeat thyroid testing only  Please continue all other medications as before, and refills have been done if requested.  Please have the pharmacy call with any other refills you may need.  Please continue your efforts at being more active, low cholesterol diet, and weight control.  You are otherwise up to date with prevention measures today.  Please keep your appointments with your specialists as you may have planned  Please return in 6 months, or sooner if needed, with Lab testing done 3-5 days before at the [facility] - please make appt as you leave today for this

## 2019-07-29 NOTE — Progress Notes (Signed)
Subjective:    Patient ID: Kelsey Keller, female    DOB: 1941/02/03, 79 y.o.   MRN: [id number]  HPI  Here for wellness and f/u;  Overall doing ok;  Pt denies Chest pain, worsening SOB, DOE, wheezing, orthopnea, PND, worsening LE edema, palpitations, dizziness or syncope.  Pt denies neurological change such as new headache, facial or extremity weakness.  Pt denies polydipsia, polyuria, or low sugar symptoms. Pt states overall good compliance with treatment and medications, good tolerability, and has been trying to follow appropriate diet.  Pt denies worsening depressive symptoms, suicidal ideation or panic. No fever, night sweats, wt loss, loss of appetite, or other constitutional symptoms.  Pt states good ability with ADL's, has low fall risk, home safety reviewed and adequate, no other significant changes in hearing or vision, and only occasionally active with exercise.  To get corona vaccine #1 tomorrow.  Had "irregular beat" noted on VSs today.  Denies hyper or hypo thyroid symptoms such as voice, skin or hair change. Past Medical History:  Diagnosis Date  . ALLERGIC RHINITIS 04/29/2007  . Allergy   . Cataract    both eyes  . DIABETES MELLITUS, TYPE II 06/23/2007   type 2  . Elevated liver enzymes 03/2017  . Elevated tumor markers   . GERD (gastroesophageal reflux disease)   . HIATAL HERNIA, HX OF 04/29/2007  . HYPERLIPIDEMIA 04/29/2007   diet controlled on CoQ10  . HYPERTENSION 04/29/2007  . HYPOKALEMIA 05/02/2009  . HYPOTHYROIDISM 04/29/2007  . LOW BACK PAIN 04/29/2007  . Neuromuscular disorder ([place])    diabetic neuropathy in both legs  . OSTEOARTHRITIS, KNEE, LEFT 06/23/2007  . OSTEOPOROSIS NOS 05/12/2007  . SPINAL STENOSIS, LUMBAR 04/29/2007   Past Surgical History:  Procedure Laterality Date  . BACK SURGERY N/A 2014 x 2   T1 TO S 1   . CATARACT EXTRACTION W/ INTRAOCULAR LENS  IMPLANT, BILATERAL Bilateral 05/2011   doing well  . COLONOSCOPY WITH PROPOFOL N/A 04/10/2017   Procedure: COLONOSCOPY WITH PROPOFOL;  Surgeon: [name], MD;  Location: [facility];  Service: Endoscopy;  Laterality: N/A;  . COLONSCOPY  2011  . TOTAL KNEE ARTHROPLASTY  2006   right  . TOTAL KNEE ARTHROPLASTY Left 2012    reports that she has never smoked. She has never used smokeless tobacco. She reports current alcohol use. She reports that she does not use drugs. family history includes Arthritis in her brother and mother; COPD in her brother; Colon polyps in an other family member; Coronary artery disease in her father and another family member; Diabetes in an other family member; Hyperlipidemia in her brother; Hypertension in her mother; Stroke in her brother. No Known Allergies Current Outpatient Medications on File Prior to Visit  Medication Sig Dispense Refill  . aspirin 81 MG EC tablet Take 81 mg by mouth daily.      Marland Kitchen atenolol (TENORMIN) 50 MG tablet Take 1 tablet (50 mg total) by mouth daily. 90 tablet 3  . blood glucose meter kit and supplies KIT Dispense with patient  Insurance; Use up to four times daily as directed. E11.9 1 each 0  . Blood Glucose Monitoring Suppl (ONE TOUCH ULTRA 2) w/Device KIT Use as directed up to 4 times per day E11.9 1 each 0  . Calcium Carb-Cholecalciferol (CALCIUM 600+D3) 600-800 MG-UNIT TABS Take 1 tablet by mouth every evening.    . cetirizine (ZYRTEC) 10 MG tablet Take 1 tablet (10 mg total) by mouth daily. (Patient taking differently:  Take 10 mg by mouth daily as needed for allergies or rhinitis. ) 90 tablet 3  . chlorthalidone (HYGROTON) 25 MG tablet Take 1 tablet (25 mg total) by mouth daily. 90 tablet 3  . Co-Enzyme Q-10 100 MG CAPS Take 100 mg by mouth daily.    . fluticasone (FLONASE) 50 MCG/ACT nasal spray Place 2 sprays into both nostrils daily. 48 g 3  . glimepiride (AMARYL) 4 MG tablet TAKE 1 TABLET (4 MG TOTAL) BY MOUTH 2 (TWO) TIMES DAILY 180 tablet 3  . glucose blood (ONE TOUCH ULTRA TEST) test strip Use as instructed up to 4  times per day  E11.9 100 each 12  . Lancets MISC Use as directed up to four times per day E11.9 200 each 11  . meclizine (ANTIVERT) 12.5 MG tablet Take 1 tablet (12.5 mg total) by mouth 3 (three) times daily as needed for dizziness. 90 tablet 1  . meloxicam (MOBIC) 7.5 MG tablet TAKE 1 TABLET BY MOUTH EVERY DAY AS NEEDED FOR PAIN 90 tablet 2  . metFORMIN (GLUCOPHAGE) 1000 MG tablet TAKE 1 TABLET (1,000 MG TOTAL) BY MOUTH 2 (TWO) TIMES DAILY WITH A MEAL. 180 tablet 3  . omeprazole (PRILOSEC) 40 MG capsule 1 tab by mouth twice per day 180 capsule 3  . pioglitazone (ACTOS) 15 MG tablet TAKE 1 TABLET BY MOUTH EVERY DAY 90 tablet 3  . Red Yeast Rice Extract 600 MG CAPS Take 1,200 mg by mouth 2 (two) times daily.    . traMADol (ULTRAM) 50 MG tablet Take 1 tablet (50 mg total) by mouth every 8 (eight) hours as needed. 90 tablet 5   No current facility-administered medications on file prior to visit.   Review of Systems Constitutional: Negative for other unusual diaphoresis, sweats, appetite or weight changes HENT: Negative for other worsening hearing loss, ear pain, facial swelling, mouth sores or neck stiffness.   Eyes: Negative for other worsening pain, redness or other visual disturbance.  Respiratory: Negative for other stridor or swelling Cardiovascular: Negative for other palpitations or other chest pain  Gastrointestinal: Negative for worsening diarrhea or loose stools, blood in stool, distention or other pain Genitourinary: Negative for hematuria, flank pain or other change in urine volume.  Musculoskeletal: Negative for myalgias or other joint swelling.  Skin: Negative for other color change, or other wound or worsening drainage.  Neurological: Negative for other syncope or numbness. Hematological: Negative for other adenopathy or swelling Psychiatric/Behavioral: Negative for hallucinations, other worsening agitation, SI, self-injury, or new decreased concentration All otherwise neg per  pt     Objective:   Physical Exam BP 140/70 (BP Location: Left Arm, Patient Position: Sitting, Cuff Size: Large)   Pulse 80   Temp 98.8 F (37.1 C) (Oral)   Ht '5\' 4"'  (1.626 m)   Wt 228 lb (103.4 kg)   SpO2 96%   BMI 39.14 kg/m  VS noted,  Constitutional: Pt is oriented to person, place, and time. Appears well-developed and well-nourished, in no significant distress and comfortable Head: Normocephalic and atraumatic  Eyes: Conjunctivae and EOM are normal. Pupils are equal, round, and reactive to light Right Ear: External ear normal without discharge Left Ear: External ear normal without discharge Nose: Nose without discharge or deformity Mouth/Throat: Oropharynx is without other ulcerations and moist  Neck: Normal range of motion. Neck supple. No JVD present. No tracheal deviation present or significant neck LA or mass Cardiovascular: Normal rate, regular rhythm, normal heart sounds and intact distal pulses.  Pulmonary/Chest: WOB normal and breath sounds without rales or wheezing  Abdominal: Soft. Bowel sounds are normal. NT. No HSM  Musculoskeletal: Normal range of motion. Exhibits no edema Lymphadenopathy: Has no other cervical adenopathy.  Neurological: Pt is alert and oriented to person, place, and time. Pt has normal reflexes. No cranial nerve deficit. Motor grossly intact, Gait intact Skin: Skin is warm and dry. No rash noted or new ulcerations Psychiatric:  Has normal mood and affect. Behavior is normal without agitation All otherwise neg per pt Lab Results  Component Value Date   WBC 4.7 07/22/2019   HGB 12.9 07/22/2019   HCT 38.4 07/22/2019   PLT 132.0 (L) 07/22/2019   GLUCOSE 106 (H) 07/22/2019   CHOL 166 07/22/2019   TRIG 70.0 07/22/2019   HDL 79.20 07/22/2019   LDLDIRECT 107.5 07/21/2013   LDLCALC 72 07/22/2019   ALT 28 07/22/2019   AST 29 07/22/2019   NA 140 07/22/2019   K 3.6 07/22/2019   CL 103 07/22/2019   CREATININE 0.98 07/22/2019   BUN 33 (H)  07/22/2019   CO2 30 07/22/2019   TSH 0.17 (L) 07/22/2019   INR 1.0 07/22/2019   HGBA1C 7.1 (H) 07/22/2019   MICROALBUR 4.0 (H) 07/22/2019   ECG I have personally interpreted today - NSR    Assessment & Plan:

## 2019-07-30 ENCOUNTER — Ambulatory Visit: Payer: Medicare Other | Attending: Internal Medicine

## 2019-07-30 DIAGNOSIS — Z23 Encounter for immunization: Secondary | ICD-10-CM | POA: Insufficient documentation

## 2019-07-30 NOTE — Progress Notes (Signed)
   Covid-19 Vaccination Clinic  Name:  Kelsey Keller    MRN: [id number] DOB: 05/16/1941  07/30/2019  [name] was observed post Covid-19 immunization for 15 minutes without incidence. She was provided with Vaccine Information Sheet and instruction to access the V-Safe system.   [name] was instructed to call 911 with any severe reactions post vaccine: Marland Kitchen Difficulty breathing  . Swelling of your face and throat  . A fast heartbeat  . A bad rash all over your body  . Dizziness and weakness    Immunizations Administered    Name Date Dose VIS Date Route   Pfizer COVID-19 Vaccine 07/30/2019 11:14 AM 0.3 mL 06/25/2019 Intramuscular   Manufacturer: [employer], [employer]   Lot: [id number]   [place]: [id number]

## 2019-07-31 ENCOUNTER — Encounter: Payer: Self-pay | Admitting: Internal Medicine

## 2019-07-31 NOTE — Assessment & Plan Note (Signed)
ecg reveiwed,  to f/u any worsening symptoms or concerns

## 2019-07-31 NOTE — Assessment & Plan Note (Signed)
Mild low TSH - for decreased levothyroxin 150 qd, f/u TFTs in 4 wks

## 2019-07-31 NOTE — Assessment & Plan Note (Signed)
stable overall by history and exam, recent data reviewed with pt, and pt to continue medical treatment as before,  to f/u any worsening symptoms or concerns  

## 2019-07-31 NOTE — Assessment & Plan Note (Signed)

## 2019-08-17 ENCOUNTER — Ambulatory Visit: Payer: Medicare HMO | Admitting: Gastroenterology

## 2019-08-17 ENCOUNTER — Encounter: Payer: Self-pay | Admitting: Gastroenterology

## 2019-08-17 VITALS — BP 122/60 | HR 54 | Temp 97.4°F | Ht 64.0 in | Wt 232.0 lb

## 2019-08-17 DIAGNOSIS — K7460 Unspecified cirrhosis of liver: Secondary | ICD-10-CM

## 2019-08-17 NOTE — Progress Notes (Signed)
Review of pertinent gastrointestinal problems: 1.Cirrhosis:(Very possibly "burned out" autoimmune hepatitis)CT abdomen and pelvis done on 03/12/2017 showed a nodular liver consistent with cirrhosis, there is a 9 mm lesion at the right hepatic dome too small to characterize, no biliary dilation, she did have a thick-walled gallbladder with multiple gallstones, no splenomegaly but noted splenic varices.  Laboratory workup for cirrhosis September 2018:ANA was +1:160 titer;anti-smooth muscle antibody +, hepatitis A total antibody negative, hepatitis B surface antigen negative, hepatitis B surface antibody negative, hepatitis C antibody negative, ceruloplasmin normal, iron studies normal, alpha-1 antitrypsin level normal, AMAnegative  Liver lesion on MRI 03/2017;peripherally enhancing T2 hyperintense and T1 hypointense lesion within segment 8 of the liver, measuring 1.8 cm, and indeterminant.(IR was unable biopsy this);repeat MRI March 2019 showed that the lesion in question had completely disappeared, new issue was raised with "vascular abnormality" and radiology recommended follow-up MRI 3-6 months.  MRI 04/2018: Cirrhosis with "stable appearance of vascular shunt in the anterior right lobe.  No evidence of hepatic neoplasm."  [place] January 2021 "probable cirrhosis without focal mass".  Cholelithiasis.  AFP 07/2019 2.4 (normal)  MELD-Na 7 (07/2019 labs)  EGD9/2018;no signs of portal hypertension  Liver biopsy 04/2017:Cirrhosis with mild cholestasis. Pathology felt possibly this was possiblydue to an autoimmune process, 2. Colonoscopy 03/2017 (for slightly elevated CEA and liver lesion on imaging): normal TI, normal colon 3.Abdominal pain, elevated liver tests, September 2018. Gallstones in her gallbladder on imaging however pain not completely consistent with biliary process. EGD showed with deep duodenal bulb ulcer. Biopsies of her stomach showed no H. Pylori. Seems likely the ulcer was  from unopposed meloxicam. Her pains improved quickly with proton pump inhibitor, her liver tests normalized as well. 4.  Pancreatic cystic lesion, body of pancreas.  MRI 04/2018 "stable 8 mm cystic lesion in the pancreatic body" no concerning morphologic features such as dilated main pancreatic duct or enhancing lesions.   HPI: This is a very pleasant 79 year old woman whom I last saw little over a year ago.  She is here today for routine cirrhosis follow-up.  She had a battery of blood tests and ultrasound last month.  Meld score 7.  Ultrasound showed no sign of liver tumors, cancers.  She feels overall quite well.  She is now bothered by swelling in her ankles, no swelling in her abdomen.  No overt encephalopathy.  No overt GI bleeding.  Her weight is down 14 pounds since her last visit here November 2019, same scale here in our office.  ROS: complete GI ROS as described in HPI, all other review negative.  Constitutional:  No unintentional weight loss   Past Medical History:  Diagnosis Date  . ALLERGIC RHINITIS 04/29/2007  . Allergy   . Cataract    both eyes  . DIABETES MELLITUS, TYPE II 06/23/2007   type 2  . Elevated liver enzymes 03/2017  . Elevated tumor markers   . GERD (gastroesophageal reflux disease)   . HIATAL HERNIA, HX OF 04/29/2007  . HYPERLIPIDEMIA 04/29/2007   diet controlled on CoQ10  . HYPERTENSION 04/29/2007  . HYPOKALEMIA 05/02/2009  . HYPOTHYROIDISM 04/29/2007  . LOW BACK PAIN 04/29/2007  . Neuromuscular disorder ([place])    diabetic neuropathy in both legs  . OSTEOARTHRITIS, KNEE, LEFT 06/23/2007  . OSTEOPOROSIS NOS 05/12/2007  . SPINAL STENOSIS, LUMBAR 04/29/2007    Past Surgical History:  Procedure Laterality Date  . BACK SURGERY N/A 2014 x 2   T1 TO S 1   . CATARACT EXTRACTION W/ INTRAOCULAR  LENS  IMPLANT, BILATERAL Bilateral 05/2011   doing well  . COLONOSCOPY WITH PROPOFOL N/A 04/10/2017   Procedure: COLONOSCOPY WITH PROPOFOL;  Surgeon: [name], MD;  Location: [facility];  Service: Endoscopy;  Laterality: N/A;  . COLONSCOPY  2011  . TOTAL KNEE ARTHROPLASTY  2006   right  . TOTAL KNEE ARTHROPLASTY Left 2012    Current Outpatient Medications  Medication Sig Dispense Refill  . aspirin 81 MG EC tablet Take 81 mg by mouth daily.      Marland Kitchen atenolol (TENORMIN) 50 MG tablet Take 1 tablet (50 mg total) by mouth daily. 90 tablet 3  . blood glucose meter kit and supplies KIT Dispense with patient  Insurance; Use up to four times daily as directed. E11.9 1 each 0  . Blood Glucose Monitoring Suppl (ONE TOUCH ULTRA 2) w/Device KIT Use as directed up to 4 times per day E11.9 1 each 0  . Calcium Carb-Cholecalciferol (CALCIUM 600+D3) 600-800 MG-UNIT TABS Take 1 tablet by mouth every evening.    . cetirizine (ZYRTEC) 10 MG tablet Take 1 tablet (10 mg total) by mouth daily. (Patient taking differently: Take 10 mg by mouth daily as needed for allergies or rhinitis. ) 90 tablet 3  . chlorthalidone (HYGROTON) 25 MG tablet Take 1 tablet (25 mg total) by mouth daily. 90 tablet 3  . Co-Enzyme Q-10 100 MG CAPS Take 100 mg by mouth daily.    . fluticasone (FLONASE) 50 MCG/ACT nasal spray Place 2 sprays into both nostrils daily. 48 g 3  . glimepiride (AMARYL) 4 MG tablet TAKE 1 TABLET (4 MG TOTAL) BY MOUTH 2 (TWO) TIMES DAILY 180 tablet 3  . glucose blood (ONE TOUCH ULTRA TEST) test strip Use as instructed up to 4 times per day  E11.9 100 each 12  . Lancets MISC Use as directed up to four times per day E11.9 200 each 11  . levothyroxine (SYNTHROID) 150 MCG tablet Take 1 tablet (150 mcg total) by mouth daily. 90 tablet 3  . meclizine (ANTIVERT) 12.5 MG tablet Take 1 tablet (12.5 mg total) by mouth 3 (three) times daily as needed for dizziness. 90 tablet 1  . meloxicam (MOBIC) 7.5 MG tablet TAKE 1 TABLET BY MOUTH EVERY DAY AS NEEDED FOR PAIN 90 tablet 2  . metFORMIN (GLUCOPHAGE) 1000 MG tablet TAKE 1 TABLET (1,000 MG TOTAL) BY MOUTH 2 (TWO) TIMES DAILY  WITH A MEAL. 180 tablet 3  . omeprazole (PRILOSEC) 40 MG capsule 1 tab by mouth twice per day (Patient taking differently: Take 40 mg by mouth daily. 1 tab by mouth daily) 180 capsule 3  . pioglitazone (ACTOS) 15 MG tablet TAKE 1 TABLET BY MOUTH EVERY DAY 90 tablet 3  . Red Yeast Rice Extract 600 MG CAPS Take 1,200 mg by mouth 2 (two) times daily.    . traMADol (ULTRAM) 50 MG tablet Take 1 tablet (50 mg total) by mouth every 8 (eight) hours as needed. 90 tablet 5   No current facility-administered medications for this visit.    Allergies as of 08/17/2019  . (No Known Allergies)    Family History  Problem Relation Age of Onset  . Coronary artery disease Other        Female 1st degree relative  . Diabetes Other        1st degree relative  . Colon polyps Other   . Arthritis Mother   . Hypertension Mother   . Coronary artery disease Father   .  Arthritis Brother   . Stroke Brother   . Hyperlipidemia Brother   . COPD Brother   . Colon cancer Neg Hx   . Esophageal cancer Neg Hx   . Rectal cancer Neg Hx   . Stomach cancer Neg Hx     Social History   Socioeconomic History  . Marital status: Divorced    Spouse name: Not on file  . Number of children: 1  . Years of education: Not on file  . Highest education level: Not on file  Occupational History  . 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 Smoking status: Never Smoker  . Smokeless tobacco: Never Used  Substance and Sexual Activity  . Alcohol use: Yes    Alcohol/week: 0.0 standard drinks    Comment: One or two drinks in month  . Drug use: No  . Sexual activity: Never  Other Topics Concern  . Not on file  Social History Narrative  . Not on file   Social Determinants of Health   Financial Resource Strain:   . Difficulty of Paying Living Expenses: Not on file  Food Insecurity:   . Worried About Charity fundraiser in the Last Year: Not on file  . Ran Out of Food in the Last Year: Not on file   Transportation Needs:   . Lack of Transportation (Medical): Not on file  . Lack of Transportation (Non-Medical): Not on file  Physical Activity:   . Days of Exercise per Week: Not on file  . Minutes of Exercise per Session: Not on file  Stress:   . Feeling of Stress : Not on file  Social Connections:   . Frequency of Communication with Friends and Family: Not on file  . Frequency of Social Gatherings with Friends and Family: Not on file  . Attends Religious Services: Not on file  . Active Member of Clubs or Organizations: Not on file  . Attends Archivist Meetings: Not on file  . Marital Status: Not on file  Intimate Partner Violence:   . Fear of Current or Ex-Partner: Not on file  . Emotionally Abused: Not on file  . Physically Abused: Not on file  . Sexually Abused: Not on file     Physical Exam: Temp (!) 97.4 F (36.3 C)   Ht '5\' 4"'  (1.626 m)   Wt 232 lb (105.2 kg)   BMI 39.82 kg/m  Constitutional: generally well-appearing Psychiatric: alert and oriented x3 Abdomen: soft, nontender, nondistended, no obvious ascites, no peritoneal signs, normal bowel sounds No peripheral edema noted in lower extremities  Assessment and plan: 79 y.o. female with well compensated cirrhosis  She is doing very well.  Her cirrhosis is well compensated, meld 7.  She is up-to-date on hepatoma screening.  She will have repeat hepatoma screening in 6 months and a return office visit with me for routine cirrhosis follow-up in 1 year.  Please see the "Patient Instructions" section for addition details about the plan.  Owens Loffler, MD [facility] 08/17/2019, 11:04 AM   Total time on date of encounter was 21 minutes (this included time spent preparing to see the patient reviewing records; obtaining and/or reviewing separately obtained history; performing a medically appropriate exam and/or evaluation; counseling and educating the patient and family if present; ordering  medications, tests or procedures if applicable; and documenting clinical information in the health record).

## 2019-08-17 NOTE — Patient Instructions (Signed)
If you are age 77 or older, your body mass index should be between 23-30. Your Body mass index is 39.82 kg/m. If this is out of the aforementioned range listed, please consider follow up with your Primary Care Provider.  If you are age 29 or younger, your body mass index should be between 19-25. Your Body mass index is 39.82 kg/m. If this is out of the aformentioned range listed, please consider follow up with your Primary Care Provider.   You will follow up with Dr Ardis Hughs in 1 year.  In 6 months, we will call you for repeat lab work and right upper quadrant ultrasound.  Thank you, Dr Ardis Hughs

## 2019-08-20 ENCOUNTER — Ambulatory Visit: Payer: Medicare HMO | Attending: Internal Medicine

## 2019-08-20 DIAGNOSIS — Z23 Encounter for immunization: Secondary | ICD-10-CM | POA: Insufficient documentation

## 2019-08-20 NOTE — Progress Notes (Signed)
   Covid-19 Vaccination Clinic  Name:  Kelsey Keller    MRN: [id number] DOB: January 27, 1941  08/20/2019  [name] was observed post Covid-19 immunization for 15 minutes without incidence. She was provided with Vaccine Information Sheet and instruction to access the V-Safe system.   [name] was instructed to call 911 with any severe reactions post vaccine: Marland Kitchen Difficulty breathing  . Swelling of your face and throat  . A fast heartbeat  . A bad rash all over your body  . Dizziness and weakness    Immunizations Administered    Name Date Dose VIS Date Route   Pfizer COVID-19 Vaccine 08/20/2019 10:52 AM 0.3 mL 06/25/2019 Intramuscular   Manufacturer: [place]   Lot: [id number]   [place]: [id number]

## 2019-08-26 ENCOUNTER — Other Ambulatory Visit: Payer: Self-pay

## 2019-08-26 ENCOUNTER — Other Ambulatory Visit (INDEPENDENT_AMBULATORY_CARE_PROVIDER_SITE_OTHER): Payer: Medicare HMO

## 2019-08-26 DIAGNOSIS — E119 Type 2 diabetes mellitus without complications: Secondary | ICD-10-CM | POA: Diagnosis not present

## 2019-08-26 DIAGNOSIS — E039 Hypothyroidism, unspecified: Secondary | ICD-10-CM | POA: Diagnosis not present

## 2019-08-26 LAB — BASIC METABOLIC PANEL
BUN: 31 mg/dL — ABNORMAL HIGH (ref 6–23)
CO2: 32 mEq/L (ref 19–32)
Calcium: 9.8 mg/dL (ref 8.4–10.5)
Chloride: 102 mEq/L (ref 96–112)
Creatinine, Ser: 1.02 mg/dL (ref 0.40–1.20)
GFR: 52.57 mL/min — ABNORMAL LOW (ref >60.00)
Glucose, Bld: 146 mg/dL — ABNORMAL HIGH (ref 70–99)
Potassium: 3.9 mEq/L (ref 3.5–5.1)
Sodium: 140 mEq/L (ref 135–145)

## 2019-08-26 LAB — HEPATIC FUNCTION PANEL
ALT: 22 U/L (ref 0–35)
AST: 28 U/L (ref 0–37)
Albumin: 3.8 g/dL (ref 3.5–5.2)
Alkaline Phosphatase: 86 U/L (ref 39–117)
Bilirubin, Direct: 0.2 mg/dL (ref 0.0–0.3)
Total Bilirubin: 0.7 mg/dL (ref 0.2–1.2)
Total Protein: 6.2 g/dL (ref 6.0–8.3)

## 2019-08-26 LAB — LIPID PANEL
Cholesterol: 154 mg/dL (ref 0–200)
HDL: 66.4 mg/dL (ref >39.00)
LDL Cholesterol: 69 mg/dL (ref 0–99)
NonHDL: 87.7
Total CHOL/HDL Ratio: 2
Triglycerides: 96 mg/dL (ref 0.0–149.0)
VLDL: 19.2 mg/dL (ref 0.0–40.0)

## 2019-08-26 LAB — TSH: TSH: 0.2 u[IU]/mL — ABNORMAL LOW (ref 0.35–4.50)

## 2019-08-26 LAB — HEMOGLOBIN A1C: Hgb A1c MFr Bld: 6.9 % — ABNORMAL HIGH (ref 4.6–6.5)

## 2019-08-29 ENCOUNTER — Other Ambulatory Visit: Payer: Self-pay | Admitting: Internal Medicine

## 2019-08-29 MED ORDER — LEVOTHYROXINE SODIUM 125 MCG PO TABS: 125.0000 ug | ORAL_TABLET | Freq: Every day | ORAL | 3 refills | Status: DC

## 2019-08-30 ENCOUNTER — Telehealth: Payer: Self-pay | Admitting: Internal Medicine

## 2019-08-30 DIAGNOSIS — E039 Hypothyroidism, unspecified: Secondary | ICD-10-CM

## 2019-08-30 NOTE — Telephone Encounter (Signed)
Ok for TFTs at 4 wks -   I have done orders and please assist with pt to make appt for first floor lab

## 2019-08-30 NOTE — Telephone Encounter (Signed)
-----   Message from Marcina Millard, [place] sent at 08/30/2019 10:40 AM EST ----- Spoke with patient today and results given. She wanted to know when did you want to see her back for TSH labs? Did you want to address at her 6 month follow up in July or sooner?  Please advise

## 2019-08-30 NOTE — Telephone Encounter (Signed)
Called patient and left message for her to schedule lab appointment around week of 09/27/19 for lab recheck. Lab order in place. She just needs appointment to come back here and have them redrawn.

## 2019-09-06 DIAGNOSIS — R69 Illness, unspecified: Secondary | ICD-10-CM | POA: Diagnosis not present

## 2019-09-13 DIAGNOSIS — E119 Type 2 diabetes mellitus without complications: Secondary | ICD-10-CM | POA: Diagnosis not present

## 2019-09-13 LAB — HM DIABETES EYE EXAM

## 2019-09-14 DIAGNOSIS — L84 Corns and callosities: Secondary | ICD-10-CM | POA: Diagnosis not present

## 2019-09-14 DIAGNOSIS — R69 Illness, unspecified: Secondary | ICD-10-CM | POA: Diagnosis not present

## 2019-09-14 DIAGNOSIS — M21611 Bunion of right foot: Secondary | ICD-10-CM | POA: Diagnosis not present

## 2019-09-14 DIAGNOSIS — M79672 Pain in left foot: Secondary | ICD-10-CM | POA: Diagnosis not present

## 2019-09-14 DIAGNOSIS — M21612 Bunion of left foot: Secondary | ICD-10-CM | POA: Diagnosis not present

## 2019-09-14 DIAGNOSIS — E1151 Type 2 diabetes mellitus with diabetic peripheral angiopathy without gangrene: Secondary | ICD-10-CM | POA: Diagnosis not present

## 2019-09-20 ENCOUNTER — Other Ambulatory Visit: Payer: Self-pay | Admitting: Internal Medicine

## 2019-09-20 ENCOUNTER — Telehealth: Payer: Self-pay | Admitting: Internal Medicine

## 2019-09-20 MED ORDER — LEVOTHYROXINE SODIUM 125 MCG PO TABS: 125.0000 ug | ORAL_TABLET | Freq: Every day | ORAL | 0 refills | Status: DC

## 2019-09-20 NOTE — Telephone Encounter (Signed)
New Message:   Pt states she fell in the bath tub on 08/21/19 and was there until 08/22/19 due to needing help to get out of the tub. She states she is very sore and would like to know if she should come in or is there something she should be doing to help with the soreness. Please advise.

## 2019-09-20 NOTE — Telephone Encounter (Signed)
Pt has been scheduled.  °

## 2019-09-20 NOTE — Telephone Encounter (Signed)
Pt will need to make an appt to be evaluated before provider is able to rx something. Pls call pt and make his next availability.Marland KitchenJohny Keller

## 2019-09-21 ENCOUNTER — Ambulatory Visit (INDEPENDENT_AMBULATORY_CARE_PROVIDER_SITE_OTHER): Payer: Medicare HMO

## 2019-09-21 ENCOUNTER — Ambulatory Visit: Payer: Medicare HMO

## 2019-09-21 ENCOUNTER — Encounter: Payer: Self-pay | Admitting: Internal Medicine

## 2019-09-21 ENCOUNTER — Ambulatory Visit (INDEPENDENT_AMBULATORY_CARE_PROVIDER_SITE_OTHER): Payer: Medicare HMO | Admitting: Internal Medicine

## 2019-09-21 ENCOUNTER — Other Ambulatory Visit: Payer: Self-pay

## 2019-09-21 VITALS — BP 134/74 | HR 62 | Temp 98.1°F | Ht 64.0 in | Wt 236.8 lb

## 2019-09-21 DIAGNOSIS — S2242XA Multiple fractures of ribs, left side, initial encounter for closed fracture: Secondary | ICD-10-CM | POA: Diagnosis not present

## 2019-09-21 DIAGNOSIS — K269 Duodenal ulcer, unspecified as acute or chronic, without hemorrhage or perforation: Secondary | ICD-10-CM | POA: Diagnosis not present

## 2019-09-21 DIAGNOSIS — E039 Hypothyroidism, unspecified: Secondary | ICD-10-CM

## 2019-09-21 DIAGNOSIS — E119 Type 2 diabetes mellitus without complications: Secondary | ICD-10-CM | POA: Diagnosis not present

## 2019-09-21 DIAGNOSIS — E785 Hyperlipidemia, unspecified: Secondary | ICD-10-CM | POA: Diagnosis not present

## 2019-09-21 DIAGNOSIS — K2970 Gastritis, unspecified, without bleeding: Secondary | ICD-10-CM

## 2019-09-21 DIAGNOSIS — K2990 Gastroduodenitis, unspecified, without bleeding: Secondary | ICD-10-CM

## 2019-09-21 DIAGNOSIS — R0781 Pleurodynia: Secondary | ICD-10-CM

## 2019-09-21 DIAGNOSIS — R1013 Epigastric pain: Secondary | ICD-10-CM | POA: Diagnosis not present

## 2019-09-21 DIAGNOSIS — I10 Essential (primary) hypertension: Secondary | ICD-10-CM

## 2019-09-21 LAB — T4, FREE: Free T4: 1.27 ng/dL (ref 0.60–1.60)

## 2019-09-21 LAB — TSH: TSH: 1.83 u[IU]/mL (ref 0.35–4.50)

## 2019-09-21 MED ORDER — GLIMEPIRIDE 4 MG PO TABS: 4.0000 mg | ORAL_TABLET | Freq: Two times a day (BID) | ORAL | 3 refills | Status: DC

## 2019-09-21 MED ORDER — FLUTICASONE PROPIONATE 50 MCG/ACT NA SUSP: 2.0000 | Freq: Every day | NASAL | 3 refills | Status: DC

## 2019-09-21 MED ORDER — LEVOTHYROXINE SODIUM 125 MCG PO TABS: 125.0000 ug | ORAL_TABLET | Freq: Every day | ORAL | 3 refills | Status: DC

## 2019-09-21 MED ORDER — MELOXICAM 7.5 MG PO TABS: ORAL_TABLET | ORAL | 3 refills | Status: DC

## 2019-09-21 MED ORDER — TRAMADOL HCL 50 MG PO TABS: 50.0000 mg | ORAL_TABLET | Freq: Three times a day (TID) | ORAL | 5 refills | Status: DC | PRN

## 2019-09-21 MED ORDER — METFORMIN HCL 1000 MG PO TABS: 1000.0000 mg | ORAL_TABLET | Freq: Two times a day (BID) | ORAL | 3 refills | Status: DC

## 2019-09-21 MED ORDER — PIOGLITAZONE HCL 15 MG PO TABS: 15.0000 mg | ORAL_TABLET | Freq: Every day | ORAL | 3 refills | Status: DC

## 2019-09-21 MED ORDER — ATENOLOL 50 MG PO TABS: 50.0000 mg | ORAL_TABLET | Freq: Every day | ORAL | 3 refills | Status: DC

## 2019-09-21 MED ORDER — CHLORTHALIDONE 25 MG PO TABS: 25.0000 mg | ORAL_TABLET | Freq: Every day | ORAL | 3 refills | Status: DC

## 2019-09-21 MED ORDER — OMEPRAZOLE 40 MG PO CPDR: 40.0000 mg | DELAYED_RELEASE_CAPSULE | Freq: Every day | ORAL | 3 refills | Status: DC

## 2019-09-21 NOTE — Assessment & Plan Note (Signed)
stable overall by history and exam, recent data reviewed with pt, and pt to continue medical treatment as before,  to f/u any worsening symptoms or concerns  

## 2019-09-21 NOTE — Assessment & Plan Note (Signed)
After fall, for pain control, cxr/rib films  I spent 32 minutes in preparing to see the patient by review of recent labs, imaging and procedures, obtaining and reviewing separately obtained history, communicating with the patient and family or caregiver, ordering medications, tests or procedures, and documenting clinical information in the EHR including the differential Dx, treatment, and any further evaluation and other management of rib pain, hypothyroidism, HLD, HTN, DM

## 2019-09-21 NOTE — Progress Notes (Signed)
Subjective:    Patient ID: Kelsey Keller, female    DOB: August 09, 1940, 79 y.o.   MRN: [id number]  HPI  Here after fall in shower yesterday - Was Sitting in shower chair washing the walls but leaned too far, fell off balance and hit the edge of the tub left anterior lower chest below the breast, no overt bruising but still hurts about 25 hrs now.  Sore to touch, worse to cough and deep breathe. Was stuck in tub for hours, could not stand up, found by family.  Did not go to ED Hoping for pain control and xray.  Pt denies other chest pain, increased sob or doe, wheezing, orthopnea, PND, increased LE swelling, palpitations, dizziness or syncope.  Pt denies new neurological symptoms such as new headache, or facial or extremity weakness or numbness   Pt denies polydipsia, polyuria. Past Medical History:  Diagnosis Date  . ALLERGIC RHINITIS 04/29/2007  . Allergy   . Cataract    both eyes  . DIABETES MELLITUS, TYPE II 06/23/2007   type 2  . Elevated liver enzymes 03/2017  . Elevated tumor markers   . GERD (gastroesophageal reflux disease)   . HIATAL HERNIA, HX OF 04/29/2007  . HYPERLIPIDEMIA 04/29/2007   diet controlled on CoQ10  . HYPERTENSION 04/29/2007  . HYPOKALEMIA 05/02/2009  . HYPOTHYROIDISM 04/29/2007  . LOW BACK PAIN 04/29/2007  . Neuromuscular disorder ([place])    diabetic neuropathy in both legs  . OSTEOARTHRITIS, KNEE, LEFT 06/23/2007  . OSTEOPOROSIS NOS 05/12/2007  . SPINAL STENOSIS, LUMBAR 04/29/2007   Past Surgical History:  Procedure Laterality Date  . BACK SURGERY N/A 2014 x 2   T1 TO S 1   . CATARACT EXTRACTION W/ INTRAOCULAR LENS  IMPLANT, BILATERAL Bilateral 05/2011   doing well  . COLONOSCOPY WITH PROPOFOL N/A 04/10/2017   Procedure: COLONOSCOPY WITH PROPOFOL;  Surgeon: [name], MD;  Location: [facility];  Service: Endoscopy;  Laterality: N/A;  . COLONSCOPY  2011  . TOTAL KNEE ARTHROPLASTY  2006   right  . TOTAL KNEE ARTHROPLASTY Left 2012    reports  that she has never smoked. She has never used smokeless tobacco. She reports current alcohol use. She reports that she does not use drugs. family history includes Arthritis in her brother and mother; COPD in her brother; Colon polyps in an other family member; Coronary artery disease in her father and another family member; Diabetes in an other family member; Hyperlipidemia in her brother; Hypertension in her mother; Stroke in her brother. No Known Allergies Current Outpatient Medications on File Prior to Visit  Medication Sig Dispense Refill  . aspirin 81 MG EC tablet Take 81 mg by mouth daily.      . blood glucose meter kit and supplies KIT Dispense with patient  Insurance; Use up to four times daily as directed. E11.9 1 each 0  . Blood Glucose Monitoring Suppl (ONE TOUCH ULTRA 2) w/Device KIT Use as directed up to 4 times per day E11.9 1 each 0  . Calcium Carb-Cholecalciferol (CALCIUM 600+D3) 600-800 MG-UNIT TABS Take 1 tablet by mouth every evening.    Marland Kitchen Co-Enzyme Q-10 100 MG CAPS Take 100 mg by mouth daily.    Marland Kitchen glucose blood (ONE TOUCH ULTRA TEST) test strip Use as instructed up to 4 times per day  E11.9 100 each 12  . Lancets MISC Use as directed up to four times per day E11.9 200 each 11  . meclizine (ANTIVERT) 12.5 MG tablet  Take 1 tablet (12.5 mg total) by mouth 3 (three) times daily as needed for dizziness. 90 tablet 1  . Red Yeast Rice Extract 600 MG CAPS Take 1,200 mg by mouth 2 (two) times daily.     No current facility-administered medications on file prior to visit.   Review of Systems All otherwise neg per pt     Objective:   Physical Exam BP 134/74   Pulse 62   Temp 98.1 F (36.7 C)   Ht _0  (1.626 m)   Wt 236 lb 12.8 oz (107.4 kg)   SpO2 98%   BMI 40.65 kg/m  VS noted,  Constitutional: Pt appears in NAD HENT: Head: NCAT.  Right Ear: External ear normal.  Left Ear: External ear normal.  Eyes: . Pupils are equal, round, and reactive to light. Conjunctivae and  EOM are normal Nose: without d/c or deformity Neck: Neck supple. Gross normal ROM Cardiovascular: Normal rate and regular rhythm.   Pulmonary/Chest: Effort normal and breath sounds without rales or wheezing.  Abd:  Soft, NT, ND, + BS, no organomegaly Neurological: Pt is alert. At baseline orientation, motor grossly intact Skin: Skin is warm. No rashes, other new lesions, no LE edema Psychiatric: Pt behavior is normal without agitation  All otherwise neg per pt Lab Results  Component Value Date   WBC 4.7 07/22/2019   HGB 12.9 07/22/2019   HCT 38.4 07/22/2019   PLT 132.0 (L) 07/22/2019   GLUCOSE 146 (H) 08/26/2019   CHOL 154 08/26/2019   TRIG 96.0 08/26/2019   HDL 66.40 08/26/2019   LDLDIRECT 107.5 07/21/2013   LDLCALC 69 08/26/2019   ALT 22 08/26/2019   AST 28 08/26/2019   NA 140 08/26/2019   K 3.9 08/26/2019   CL 102 08/26/2019   CREATININE 1.02 08/26/2019   BUN 31 (H) 08/26/2019   CO2 32 08/26/2019   TSH 1.83 09/21/2019   INR 1.0 07/22/2019   HGBA1C 6.9 (H) 08/26/2019   MICROALBUR 4.0 (H) 07/22/2019      Assessment & Plan:

## 2019-09-21 NOTE — Patient Instructions (Signed)
Please take all new medication as prescribed - the tramadol as needed for pain  Please continue all other medications as before, and refills have been done if requested.  Please have the pharmacy call with any other refills you may need.  Please continue your efforts at being more active, low cholesterol diet, and weight control.  Please keep your appointments with your specialists as you may have planned  Please go to the XRAY Department in the first floor for the x-ray testing, after :  Please go to the LAB at the blood drawing area for the tests to be done today as well  You will be contacted by phone if any changes need to be made immediately.  Otherwise, you will receive a letter about your results with an explanation, but please check with MyChart first.  Please remember to sign up for MyChart if you have not done so, as this will be important to you in the future with finding out test results, communicating by private email, and scheduling acute appointments online when needed.

## 2019-09-22 ENCOUNTER — Telehealth: Payer: Self-pay | Admitting: Internal Medicine

## 2019-09-22 NOTE — Telephone Encounter (Signed)
Waiting on result note from Dr. Jenny Reichmann.

## 2019-09-22 NOTE — Telephone Encounter (Signed)
New message:    Pt is calling to see if we have got the results of her chest x-ray done on yesterday.

## 2019-09-24 ENCOUNTER — Other Ambulatory Visit: Payer: Medicare HMO

## 2019-09-29 ENCOUNTER — Encounter: Payer: Self-pay | Admitting: Internal Medicine

## 2019-10-06 DIAGNOSIS — G5703 Lesion of sciatic nerve, bilateral lower limbs: Secondary | ICD-10-CM | POA: Insufficient documentation

## 2019-10-11 ENCOUNTER — Ambulatory Visit: Payer: Medicare HMO | Admitting: Dermatology

## 2019-10-13 ENCOUNTER — Encounter: Payer: Self-pay | Admitting: *Deleted

## 2019-10-18 ENCOUNTER — Other Ambulatory Visit: Payer: Self-pay

## 2019-10-18 ENCOUNTER — Ambulatory Visit: Payer: Medicare HMO | Admitting: Dermatology

## 2019-10-18 DIAGNOSIS — D485 Neoplasm of uncertain behavior of skin: Secondary | ICD-10-CM

## 2019-10-18 DIAGNOSIS — C44319 Basal cell carcinoma of skin of other parts of face: Secondary | ICD-10-CM | POA: Diagnosis not present

## 2019-10-18 DIAGNOSIS — D225 Melanocytic nevi of trunk: Secondary | ICD-10-CM

## 2019-10-18 DIAGNOSIS — C4491 Basal cell carcinoma of skin, unspecified: Secondary | ICD-10-CM

## 2019-10-18 DIAGNOSIS — D229 Melanocytic nevi, unspecified: Secondary | ICD-10-CM

## 2019-10-18 DIAGNOSIS — D492 Neoplasm of unspecified behavior of bone, soft tissue, and skin: Secondary | ICD-10-CM

## 2019-10-18 HISTORY — DX: Basal cell carcinoma of skin, unspecified: C44.91

## 2019-10-18 NOTE — Progress Notes (Addendum)
   Follow-Up Visit   Subjective  Kelsey Keller is a 79 y.o. female who presents for the following: Annual Exam (Skin check. Concerns above right eyebrow. ).  New growth Location: Right outer eyebrow Duration: Months Quality: Crusts Associated Signs/Symptoms: Modifying Factors:  Severity:  Timing: Context: History of skin cancer  The following portions of the chart were reviewed this encounter and updated as appropriate: Tobacco  Allergies  Meds  Problems  Med Hx  Surg Hx  Fam Hx      Objective  Well appearing patient in no apparent distress; mood and affect are within normal limits.  All sun exposed areas plus back examined.   Assessment & Plan  Neoplasm of skin Right Supraorbital Region  Skin / nail biopsy Type of biopsy: tangential   Informed consent: discussed and consent obtained   Timeout: patient name, date of birth, surgical site, and procedure verified   Procedure prep:  Patient was prepped and draped in usual sterile fashion Prep type:  Chlorhexidine Anesthesia: the lesion was anesthetized in a standard fashion   Anesthetic:  1% lidocaine w/ epinephrine 1-100,000 local infiltration Instrument used: flexible razor blade   Hemostasis achieved with: ferric subsulfate   Outcome: patient tolerated procedure well   Post-procedure details: sterile dressing applied and wound care instructions given   Dressing type: petrolatum   Additional details:  Patient identified lesion of concern.  Lesion identified by physician.  Specimen 1 - Surgical pathology Differential Diagnosis: scc vs bcc Check Margins: No  Nevus Left Lower Back  No intervention necessary.

## 2019-10-20 ENCOUNTER — Telehealth: Payer: Self-pay | Admitting: Dermatology

## 2019-10-20 ENCOUNTER — Encounter: Payer: Self-pay | Admitting: Dermatology

## 2019-10-20 NOTE — Telephone Encounter (Signed)
Pathology report to patient. Per Patient she wants Dr. Denna Haggard to treat the spot. Made surgery for 11/04/19 @ 8:30.

## 2019-10-20 NOTE — Telephone Encounter (Signed)
9:10 Said she saw results on [place] told her to call when she did.

## 2019-10-20 NOTE — Telephone Encounter (Signed)
Patient left message on voice mail requesting pathology results from last visit.  Chart # [id number]

## 2019-11-04 ENCOUNTER — Other Ambulatory Visit: Payer: Self-pay

## 2019-11-04 ENCOUNTER — Ambulatory Visit (INDEPENDENT_AMBULATORY_CARE_PROVIDER_SITE_OTHER): Payer: Medicare HMO | Admitting: Dermatology

## 2019-11-04 ENCOUNTER — Encounter: Payer: Self-pay | Admitting: Dermatology

## 2019-11-04 DIAGNOSIS — C44319 Basal cell carcinoma of skin of other parts of face: Secondary | ICD-10-CM | POA: Diagnosis not present

## 2019-11-04 DIAGNOSIS — C441121 Basal cell carcinoma of skin of right upper eyelid, including canthus: Secondary | ICD-10-CM

## 2019-11-04 NOTE — Progress Notes (Addendum)
  0.7cm C/D Follow-Up Visit   Subjective  Kelsey Keller is a 79 y.o. female who presents for the following: Procedure (bcc-Right Supraorbital Region)  [facility] Location: right eyebrow Duration:  Quality:  Associated Signs/Symptoms: Modifying Factors:  Severity:  Timing: Context: for treatment  The following portions of the chart were reviewed this encounter and updated as appropriate: Tobacco  Allergies  Meds  Problems  Med Hx  Surg Hx  Fam Hx      Objective  Well appearing patient in no apparent distress; mood and affect are within normal limits.  A focused examination was performed including head and neck. Relevant physical exam findings are noted in the Assessment and Plan.   Assessment & Plan  Basal cell carcinoma (BCC) of right forehead Right Eyebrow  Destruction of lesion Complexity: simple   Destruction method: electrodesiccation and curettage   Informed consent: discussed and consent obtained   Timeout:  patient name, date of birth, surgical site, and procedure verified Anesthesia: the lesion was anesthetized in a standard fashion   Anesthetic:  1% lidocaine w/ epinephrine 1-100,000 local infiltration Curettage performed in three different directions: Yes   Curettage cycles:  3 Lesion length (cm):  1.4 Lesion width (cm):  1 Margin per side (cm):  0 Final wound size (cm):  1.4 Hemostasis achieved with:  ferric subsulfate Outcome: patient tolerated procedure well with no complications   Post-procedure details: wound care instructions given   Additional details:  Inoculated with parenteral 5% fluorouracil Curet and cauterize, base innoculated with parenteral 5FU.

## 2019-11-04 NOTE — Patient Instructions (Signed)

## 2019-11-08 ENCOUNTER — Telehealth: Payer: Self-pay | Admitting: Dermatology

## 2019-11-08 NOTE — Telephone Encounter (Signed)
Patient is calling to report that the surgical sites are healing well without any problems.  Chart [id number]

## 2019-11-09 ENCOUNTER — Encounter: Payer: Self-pay | Admitting: Dermatology

## 2019-12-15 DIAGNOSIS — G5703 Lesion of sciatic nerve, bilateral lower limbs: Secondary | ICD-10-CM | POA: Diagnosis not present

## 2019-12-25 NOTE — Addendum Note (Signed)
Addended by: Lavonna Monarch on: 12/25/2019 08:43 PM   Modules accepted: Orders

## 2020-01-20 ENCOUNTER — Other Ambulatory Visit: Payer: Self-pay

## 2020-01-20 ENCOUNTER — Other Ambulatory Visit (INDEPENDENT_AMBULATORY_CARE_PROVIDER_SITE_OTHER): Payer: Medicare HMO

## 2020-01-20 ENCOUNTER — Ambulatory Visit (INDEPENDENT_AMBULATORY_CARE_PROVIDER_SITE_OTHER): Payer: Medicare HMO

## 2020-01-20 VITALS — BP 140/70 | HR 72 | Temp 98.4°F | Resp 16 | Ht 64.0 in | Wt 235.8 lb

## 2020-01-20 DIAGNOSIS — Z0000 Encounter for general adult medical examination without abnormal findings: Secondary | ICD-10-CM

## 2020-01-20 DIAGNOSIS — E039 Hypothyroidism, unspecified: Secondary | ICD-10-CM | POA: Diagnosis not present

## 2020-01-20 LAB — TSH: TSH: 2.35 u[IU]/mL (ref 0.35–4.50)

## 2020-01-20 NOTE — Patient Instructions (Signed)
Kelsey Keller , Thank you for taking time to come for your Medicare Wellness Visit. I appreciate your ongoing commitment to your health goals. Please review the following plan we discussed and let me know if I can assist you in the future.   Screening recommendations/referrals: Colonoscopy: 04/10/2017; due every 10 years Mammogram: 07/12/2019; due every 1-2 years Bone Density: 01/14/2017; due every 2-3 years Recommended yearly ophthalmology/optometry visit for glaucoma screening and checkup Recommended yearly dental visit for hygiene and checkup  Vaccinations: Influenza vaccine: 03/20/2019 Pneumococcal vaccine: completed Tdap vaccine: 01/26/2019; due every 10 years Shingles vaccine: never done   Covid-19: completed  Advanced directives: Advance directive discussed with you today. I have provided a copy for you to complete at home and have notarized. Once this is complete please bring a copy in to our office so we can scan it into your chart.  Conditions/risks identified: Please continue to do your personal lifestyle choices by: daily care of teeth and gums, regular physical activity (goal should be 5 days a week for 30 minutes), eat a healthy diet, avoid tobacco and drug use, limiting any alcohol intake, taking a low-dose aspirin (if not allergic or have been advised by your provider otherwise) and taking vitamins and minerals as recommended by your provider. Continue doing brain stimulating activities (puzzles, reading, adult coloring books, staying active) to keep memory sharp. Continue to eat heart healthy diet (full of fruits, vegetables, whole grains, lean protein, water--limit salt, fat, and sugar intake) and increase physical activity as tolerated.  Next appointment: Please schedule your next Medicare Wellness Visit with your Nurse Health Advisor in 1 year.  Preventive Care 32 Years and Older, Female Preventive care refers to lifestyle choices and visits with your health care provider that can  promote health and wellness. What does preventive care include?  A yearly physical exam. This is also called an annual well check.  Dental exams once or twice a year.  Routine eye exams. Ask your health care provider how often you should have your eyes checked.  Personal lifestyle choices, including:  Daily care of your teeth and gums.  Regular physical activity.  Eating a healthy diet.  Avoiding tobacco and drug use.  Limiting alcohol use.  Practicing safe sex.  Taking low-dose aspirin every day.  Taking vitamin and mineral supplements as recommended by your health care provider. What happens during an annual well check? The services and screenings done by your health care provider during your annual well check will depend on your age, overall health, lifestyle risk factors, and family history of disease. Counseling  Your health care provider may ask you questions about your:  Alcohol use.  Tobacco use.  Drug use.  Emotional well-being.  Home and relationship well-being.  Sexual activity.  Eating habits.  History of falls.  Memory and ability to understand (cognition).  Work and work Statistician.  Reproductive health. Screening  You may have the following tests or measurements:  Height, weight, and BMI.  Blood pressure.  Lipid and cholesterol levels. These may be checked every 5 years, or more frequently if you are over 14 years old.  Skin check.  Lung cancer screening. You may have this screening every year starting at age 39 if you have a 30-pack-year history of smoking and currently smoke or have quit within the past 15 years.  Fecal occult blood test (FOBT) of the stool. You may have this test every year starting at age 43.  Flexible sigmoidoscopy or colonoscopy. You may have  a sigmoidoscopy every 5 years or a colonoscopy every 10 years starting at age 55.  Hepatitis C blood test.  Hepatitis B blood test.  Sexually transmitted disease  (STD) testing.  Diabetes screening. This is done by checking your blood sugar (glucose) after you have not eaten for a while (fasting). You may have this done every 1-3 years.  Bone density scan. This is done to screen for osteoporosis. You may have this done starting at age 26.  Mammogram. This may be done every 1-2 years. Talk to your health care provider about how often you should have regular mammograms. Talk with your health care provider about your test results, treatment options, and if necessary, the need for more tests. Vaccines  Your health care provider may recommend certain vaccines, such as:  Influenza vaccine. This is recommended every year.  Tetanus, diphtheria, and acellular pertussis (Tdap, Td) vaccine. You may need a Td booster every 10 years.  Zoster vaccine. You may need this after age 1.  Pneumococcal 13-valent conjugate (PCV13) vaccine. One dose is recommended after age 46.  Pneumococcal polysaccharide (PPSV23) vaccine. One dose is recommended after age 35. Talk to your health care provider about which screenings and vaccines you need and how often you need them. This information is not intended to replace advice given to you by your health care provider. Make sure you discuss any questions you have with your health care provider. Document Released: 07/28/2015 Document Revised: 03/20/2016 Document Reviewed: 05/02/2015 Elsevier Interactive Patient Education  2017 [place] Prevention in the Home Falls can cause injuries. They can happen to people of all ages. There are many things you can do to make your home safe and to help prevent falls. What can I do on the outside of my home?  Regularly fix the edges of walkways and driveways and fix any cracks.  Remove anything that might make you trip as you walk through a door, such as a raised step or threshold.  Trim any bushes or trees on the path to your home.  Use bright outdoor lighting.  Clear any  walking paths of anything that might make someone trip, such as rocks or tools.  Regularly check to see if handrails are loose or broken. Make sure that both sides of any steps have handrails.  Any raised decks and porches should have guardrails on the edges.  Have any leaves, snow, or ice cleared regularly.  Use sand or salt on walking paths during winter.  Clean up any spills in your garage right away. This includes oil or grease spills. What can I do in the bathroom?  Use night lights.  Install grab bars by the toilet and in the tub and shower. Do not use towel bars as grab bars.  Use non-skid mats or decals in the tub or shower.  If you need to sit down in the shower, use a plastic, non-slip stool.  Keep the floor dry. Clean up any water that spills on the floor as soon as it happens.  Remove soap buildup in the tub or shower regularly.  Attach bath mats securely with double-sided non-slip rug tape.  Do not have throw rugs and other things on the floor that can make you trip. What can I do in the bedroom?  Use night lights.  Make sure that you have a light by your bed that is easy to reach.  Do not use any sheets or blankets that are too big for your bed.  They should not hang down onto the floor.  Have a firm chair that has side arms. You can use this for support while you get dressed.  Do not have throw rugs and other things on the floor that can make you trip. What can I do in the kitchen?  Clean up any spills right away.  Avoid walking on wet floors.  Keep items that you use a lot in easy-to-reach places.  If you need to reach something above you, use a strong step stool that has a grab bar.  Keep electrical cords out of the way.  Do not use floor polish or wax that makes floors slippery. If you must use wax, use non-skid floor wax.  Do not have throw rugs and other things on the floor that can make you trip. What can I do with my stairs?  Do not leave  any items on the stairs.  Make sure that there are handrails on both sides of the stairs and use them. Fix handrails that are broken or loose. Make sure that handrails are as long as the stairways.  Check any carpeting to make sure that it is firmly attached to the stairs. Fix any carpet that is loose or worn.  Avoid having throw rugs at the top or bottom of the stairs. If you do have throw rugs, attach them to the floor with carpet tape.  Make sure that you have a light switch at the top of the stairs and the bottom of the stairs. If you do not have them, ask someone to add them for you. What else can I do to help prevent falls?  Wear shoes that:  Do not have high heels.  Have rubber bottoms.  Are comfortable and fit you well.  Are closed at the toe. Do not wear sandals.  If you use a stepladder:  Make sure that it is fully opened. Do not climb a closed stepladder.  Make sure that both sides of the stepladder are locked into place.  Ask someone to hold it for you, if possible.  Clearly mark and make sure that you can see:  Any grab bars or handrails.  First and last steps.  Where the edge of each step is.  Use tools that help you move around (mobility aids) if they are needed. These include:  Canes.  Walkers.  Scooters.  Crutches.  Turn on the lights when you go into a dark area. Replace any light bulbs as soon as they burn out.  Set up your furniture so you have a clear path. Avoid moving your furniture around.  If any of your floors are uneven, fix them.  If there are any pets around you, be aware of where they are.  Review your medicines with your doctor. Some medicines can make you feel dizzy. This can increase your chance of falling. Ask your doctor what other things that you can do to help prevent falls. This information is not intended to replace advice given to you by your health care provider. Make sure you discuss any questions you have with your  health care provider. Document Released: 04/27/2009 Document Revised: 12/07/2015 Document Reviewed: 08/05/2014 Elsevier Interactive Patient Education  2017 [employer].

## 2020-01-20 NOTE — Progress Notes (Signed)
Subjective:   Kelsey Keller is a 79 y.o. female who presents for Medicare Annual (Subsequent) preventive examination.  Review of Systems    No ROS. Medicare Wellness Visit Cardiac Risk Factors include: advanced age (>76mn, >>27women);diabetes mellitus;dyslipidemia;family history of premature cardiovascular disease;hypertension;obesity (BMI >30kg/m2)     Objective:    Today's Vitals   01/20/20 0819  BP: 140/70  Pulse: 72  Resp: 16  Temp: 98.4 F (36.9 C)  SpO2: 96%  Weight: 235 lb 12.8 oz (107 kg)  Height: _0  (1.626 m)  PainSc: 0-No pain   Body mass index is 40.47 kg/m.  Advanced Directives 01/20/2020 07/24/2018 04/10/2017 04/08/2017 04/07/2017 04/04/2017 10/19/2014  Does Patient Have a Medical Advance Directive? No _1  No  Type of Advance Directive - Healthcare Power of [place] will [place] will [place] will [place] will - -  Does patient want to make changes to medical advance directive? - - - No - Patient declined - - -  Copy of [place] Chart? - No - copy requested No - copy requested - - - -  Would patient like information on creating a medical advance directive? Yes (MAU/Ambulatory/Procedural Areas - Information given) - - - - - Yes - Educational materials given    Current Medications (verified) Outpatient Encounter Medications as of 01/20/2020  Medication Sig  . aspirin 81 MG EC tablet Take 81 mg by mouth daily.    .Marland Kitchenatenolol (TENORMIN) 50 MG tablet Take 1 tablet (50 mg total) by mouth daily.  . blood glucose meter kit and supplies KIT Dispense with patient  Insurance; Use up to four times daily as directed. E11.9  . Blood Glucose Monitoring Suppl (ONE TOUCH ULTRA 2) w/Device KIT Use as directed up to 4 times per day E11.9  . Calcium Carb-Cholecalciferol (CALCIUM 600+D3) 600-800 MG-UNIT TABS Take 1 tablet by mouth every evening.  .  chlorthalidone (HYGROTON) 25 MG tablet Take 1 tablet (25 mg total) by mouth daily.  .Marland KitchenCo-Enzyme Q-10 100 MG CAPS Take 100 mg by mouth daily.  . fluticasone (FLONASE) 50 MCG/ACT nasal spray Place 2 sprays into both nostrils daily.  .Marland Kitchenglimepiride (AMARYL) 4 MG tablet Take 1 tablet (4 mg total) by mouth 2 (two) times daily.  .Marland Kitchenglimepiride (AMARYL) 4 MG tablet Take by mouth.  .Marland Kitchenglucose blood (ONE TOUCH ULTRA TEST) test strip Use as instructed up to 4 times per day  E11.9  . Lancets MISC Use as directed up to four times per day E11.9  . levothyroxine (SYNTHROID) 125 MCG tablet Take 1 tablet (125 mcg total) by mouth daily before breakfast.  . levothyroxine (SYNTHROID) 125 MCG tablet Take by mouth.  . levothyroxine (SYNTHROID) 175 MCG tablet Take 175 mcg by mouth every morning.  . meclizine (ANTIVERT) 12.5 MG tablet Take 1 tablet (12.5 mg total) by mouth 3 (three) times daily as needed for dizziness.  . meloxicam (MOBIC) 7.5 MG tablet TAKE 1 TABLET BY MOUTH EVERY DAY AS NEEDED FOR PAIN  . metFORMIN (GLUCOPHAGE) 1000 MG tablet Take 1 tablet (1,000 mg total) by mouth 2 (two) times daily with a meal.  . omeprazole (PRILOSEC) 40 MG capsule Take 1 capsule (40 mg total) by mouth daily. 1 tab by mouth daily  . pioglitazone (ACTOS) 15 MG tablet Take 1 tablet (15 mg total) by mouth daily.  . Red Yeast Rice Extract 600 MG CAPS Take 1,200  mg by mouth 2 (two) times daily.  . traMADol (ULTRAM) 50 MG tablet Take 1 tablet (50 mg total) by mouth every 8 (eight) hours as needed.   No facility-administered encounter medications on file as of 01/20/2020.    Allergies (verified) Patient has no known allergies.   History: Past Medical History:  Diagnosis Date  . ALLERGIC RHINITIS 04/29/2007  . Allergy   . Basal cell carcinoma 03/24/2018   sclerosis- upper bridge of nose (MOHS)  . BCC (basal cell carcinoma) 10/18/2019   right supra orbital region- Cx3 6fu  . Cataract    both eyes  . DIABETES MELLITUS, TYPE II  06/23/2007   type 2  . Elevated liver enzymes 03/2017  . Elevated tumor markers   . GERD (gastroesophageal reflux disease)   . HIATAL HERNIA, HX OF 04/29/2007  . HYPERLIPIDEMIA 04/29/2007   diet controlled on CoQ10  . HYPERTENSION 04/29/2007  . HYPOKALEMIA 05/02/2009  . HYPOTHYROIDISM 04/29/2007  . LOW BACK PAIN 04/29/2007  . Neuromuscular disorder (HCC)    diabetic neuropathy in both legs  . OSTEOARTHRITIS, KNEE, LEFT 06/23/2007  . OSTEOPOROSIS NOS 05/12/2007  . SPINAL STENOSIS, LUMBAR 04/29/2007   Past Surgical History:  Procedure Laterality Date  . BACK SURGERY N/A 2014 x 2   T1 TO S 1   . CATARACT EXTRACTION W/ INTRAOCULAR LENS  IMPLANT, BILATERAL Bilateral 05/2011   doing well  . COLONOSCOPY WITH PROPOFOL N/A 04/10/2017   Procedure: COLONOSCOPY WITH PROPOFOL;  Surgeon: Rachael Fee, MD;  Location: [facility];  Service: Endoscopy;  Laterality: N/A;  . COLONSCOPY  2011  . TOTAL KNEE ARTHROPLASTY  2006   right  . TOTAL KNEE ARTHROPLASTY Left 2012   Family History  Problem Relation Age of Onset  . Coronary artery disease Other        Female 1st degree relative  . Diabetes Other        1st degree relative  . Colon polyps Other   . Arthritis Mother   . Hypertension Mother   . Coronary artery disease Father   . Arthritis Brother   . Stroke Brother   . Hyperlipidemia Brother   . COPD Brother   . Colon cancer Neg Hx   . Esophageal cancer Neg Hx   . Rectal cancer Neg Hx   . Stomach cancer Neg Hx    Social History   Socioeconomic History  . Marital status: Divorced    Spouse name: Not on file  . Number of children: 1  . Years of education: Not on file  . Highest education level: Not on file  Occupational History  . 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 Smoking status: Never Smoker  . Smokeless tobacco: Never Used  Vaping Use  . Vaping Use: Never used  Substance and Sexual Activity  . Alcohol use: Yes    Alcohol/week: 0.0  standard drinks    Comment: One or two drinks in month  . Drug use: No  . Sexual activity: Never  Other Topics Concern  . Not on file  Social History Narrative  . Not on file   Social Determinants of Health   Financial Resource Strain: Low Risk   . Difficulty of Paying Living Expenses: Not hard at all  Food Insecurity: No Food Insecurity  . Worried About Programme researcher, broadcasting/film/video in the Last Year: Never true  . Ran Out of Food in the Last Year: Never true  Transportation Needs:  No Transportation Needs  . Lack of Transportation (Medical): No  . Lack of Transportation (Non-Medical): No  Physical Activity: Inactive  . Days of Exercise per Week: 0 days  . Minutes of Exercise per Session: 0 min  Stress: No Stress Concern Present  . Feeling of Stress : Not at all  Social Connections: Unknown  . Frequency of Communication with Friends and Family: More than three times a week  . Frequency of Social Gatherings with Friends and Family: More than three times a week  . Attends Religious Services: More than 4 times per year  . Active Member of Clubs or Organizations: No  . Attends Archivist Meetings: Never  . Marital Status: Not on file    Tobacco Counseling Counseling given: No   Clinical Intake:  Pre-visit preparation completed: Yes  Pain : No/denies pain Pain Score: 0-No pain     BMI - recorded: 40.47 Nutritional Status: BMI > 30  Obese Nutritional Risks: None Diabetes: Yes CBG done?: No Did pt. bring in CBG monitor from home?: No  How often do you need to have someone help you when you read instructions, pamphlets, or other written materials from your doctor or pharmacy?: 1 - Never What is the last grade level you completed in school?: HSG  Diabetic? yes  Interpreter Needed?: No  Information entered by :: Arul Farabee N. Nevena Rozenberg, LPN   Activities of Daily Living In your present state of health, do you have any difficulty performing the following activities:  01/20/2020  Hearing? N  Vision? N  Difficulty concentrating or making decisions? N  Walking or climbing stairs? N  Dressing or bathing? N  Doing errands, shopping? N  Preparing Food and eating ? N  Using the Toilet? N  In the past six months, have you accidently leaked urine? Y  Do you have problems with loss of bowel control? N  Managing your Medications? N  Managing your Finances? N  Housekeeping or managing your Housekeeping? N  Some recent data might be hidden    Patient Care Team: Biagio Borg, MD as PCP - General  Indicate any recent Medical Services you may have received from other than [facility] providers in the past year (date may be approximate).     Assessment:   This is a routine wellness examination for [facility].  Hearing/Vision screen No exam data present  Dietary issues and exercise activities discussed: Current Exercise Habits: The patient does not participate in regular exercise at present, Exercise limited by: orthopedic condition(s);neurologic condition(s)  Goals    .  Exercise 150 minutes per week (moderate activity)      Exercise plan bicycle and upper body strength; Accomplished 30 min  Likes work around the house Goal is to assist with back pain/ DDD      .  Patient Stated      Stay socially involved with church and friends. Continue with weight watchers and start to get back exercising with [name].    .  Patient Stated (pt-stated)      I would like to lose 5-10 pounds.    .  Weight < 200 lb (90.719 kg)      Is using myfitnesspal to document and plans to start using a recumbant bike;       Depression Screen PHQ 2/9 Scores 01/20/2020 07/29/2019 07/24/2018 07/24/2018 01/20/2018 07/21/2017 12/11/2016  PHQ - 2 Score 0 0 0 0 0 0 0  PHQ- 9 Score - - - - 0 - -  Fall Risk Fall Risk  01/20/2020 09/21/2019 07/29/2019 07/24/2018 07/24/2018  Falls in the past year? 1 1 0 0 0  Comment - - - - -  Number falls in past yr: 1 0 0 - -  Comment - - - - -  Injury with  Fall? 1 1 0 - -  Risk for fall due to : Impaired balance/gait;Orthopedic patient - Impaired balance/gait Impaired balance/gait;Impaired mobility -  Follow up Falls evaluation completed;Education provided - - Falls prevention discussed -    Any stairs in or around the home? No  If so, are there any without handrails? No  Home free of loose throw rugs in walkways, pet beds, electrical cords, etc? Yes  Adequate lighting in your home to reduce risk of falls? Yes   ASSISTIVE DEVICES UTILIZED TO PREVENT FALLS:  Life alert? Yes  Use of a cane, walker or w/c? Yes  Grab bars in the bathroom? Yes  Shower chair or bench in shower? Yes  Elevated toilet seat or a handicapped toilet? Yes   TIMED UP AND GO:  Was the test performed? No .  Length of time to ambulate 10 feet: 0 sec.   Gait steady and fast with assistive device  Cognitive Function: MMSE - Mini Mental State Exam 10/19/2014  Orientation to time 5  Orientation to Place 5  Registration 3  Attention/ Calculation 3  Language- name 2 objects 2  Language- repeat 1  Language- follow 3 step command 3  Language- read & follow direction 1  Write a sentence 1  Copy design 1     6CIT Screen 01/20/2020  What Year? 0 points  What month? 0 points  What time? 3 points  Count back from 20 0 points  Months in reverse 0 points  Repeat phrase 0 points  Total Score 3    Immunizations Immunization History  Administered Date(s) Administered  . Influenza, High Dose Seasonal PF 04/15/2018, 03/20/2019  . Influenza-Unspecified 05/05/2017, 04/15/2018  . PFIZER SARS-COV-2 Vaccination 07/30/2019, 08/20/2019  . Pneumococcal Conjugate-13 04/15/2013  . Pneumococcal Polysaccharide-23 04/27/2008  . Td 10/25/2008  . Tdap 01/26/2019  . Zoster 10/20/2007    TDAP status: Up to date Flu Vaccine status: Up to date Pneumococcal vaccine status: Up to date Covid-19 vaccine status: Completed vaccines  Qualifies for Shingles Vaccine? Yes   Zostavax  completed Yes   Shingrix Completed?: No.    Education has been provided regarding the importance of this vaccine. Patient has been advised to call insurance company to determine out of pocket expense if they have not yet received this vaccine. Advised may also receive vaccine at local pharmacy or Health Dept. Verbalized acceptance and understanding.  Screening Tests Health Maintenance  Topic Date Due  . INFLUENZA VACCINE  02/13/2020  . HEMOGLOBIN A1C  02/23/2020  . URINE MICROALBUMIN  07/21/2020  . FOOT EXAM  07/28/2020  . OPHTHALMOLOGY EXAM  09/12/2020  . TETANUS/TDAP  01/25/2029  . DEXA SCAN  Completed  . COVID-19 Vaccine  Completed  . Hepatitis C Screening  Completed  . PNA vac Low Risk Adult  Completed    Health Maintenance  There are no preventive care reminders to display for this patient.  Colorectal cancer screening: Completed 04/10/2017. Repeat every 10 years Mammogram status: Completed 07/12/2019. Repeat every year Bone Density status: Completed 01/14/2017. Results reflect: Bone density results: OSTEOPOROSIS. Repeat every 2 years.  Lung Cancer Screening: (Low Dose CT Chest recommended if Age 32-80 years, 30 pack-year currently smoking OR have quit  w/in 15years.) does not qualify.   Lung Cancer Screening Referral: no  Additional Screening:  Hepatitis C Screening: does qualify; Completed yes  Vision Screening: Recommended annual ophthalmology exams for early detection of glaucoma and other disorders of the eye. Is the patient up to date with their annual eye exam?  Yes  Who is the provider or what is the name of the office in which the patient attends annual eye exams? [facility] If pt is not established with a provider, would they like to be referred to a provider to establish care? No .   Dental Screening: Recommended annual dental exams for proper oral hygiene  Community Resource Referral / Chronic Care Management: CRR required this visit?  No   CCM required  this visit?  No      Plan:     I have personally reviewed and noted the following in the patient's chart:   . Medical and social history . Use of alcohol, tobacco or illicit drugs  . Current medications and supplements . Functional ability and status . Nutritional status . Physical activity . Advanced directives . List of other physicians . Hospitalizations, surgeries, and ER visits in previous 12 months . Vitals . Screenings to include cognitive, depression, and falls . Referrals and appointments  In addition, I have reviewed and discussed with patient certain preventive protocols, quality metrics, and best practice recommendations. A written personalized care plan for preventive services as well as general preventive health recommendations were provided to patient.     Sheral Flow, LPN   10/16/6188   Nurse Notes: N/A

## 2020-01-24 ENCOUNTER — Telehealth: Payer: Self-pay

## 2020-01-24 DIAGNOSIS — K7460 Unspecified cirrhosis of liver: Secondary | ICD-10-CM

## 2020-01-24 NOTE — Telephone Encounter (Signed)
-----   Message from Stevan Born, [place] sent at 08/17/2019 11:22 AM EST ----- Schedule RUQ ultrasound and CBC, CMET, PT/INR, AFP

## 2020-01-24 NOTE — Telephone Encounter (Signed)
Patient advised that she has been scheduled for an abdominal ultrasound at [facility] (1st floor of hospital) on 01/28/20 at 11:00am. Please arrive 15 minutes prior to your appointment for registration. Make certain not to have anything to eat or drink after midnight the night prior to your appointment. Should you need to reschedule your appointment, please contact radiology at [phone]. This test typically takes about 30 minutes to perform.  Patient advised that she will also need lab work as well.  Patient agreed to plan and verbalized understanding.  No further questions.

## 2020-01-26 ENCOUNTER — Encounter: Payer: Self-pay | Admitting: Internal Medicine

## 2020-01-26 ENCOUNTER — Ambulatory Visit (INDEPENDENT_AMBULATORY_CARE_PROVIDER_SITE_OTHER): Payer: Medicare HMO | Admitting: Internal Medicine

## 2020-01-26 ENCOUNTER — Other Ambulatory Visit: Payer: Self-pay

## 2020-01-26 VITALS — BP 138/72 | HR 65 | Temp 98.5°F | Ht 64.0 in | Wt 237.0 lb

## 2020-01-26 DIAGNOSIS — I10 Essential (primary) hypertension: Secondary | ICD-10-CM | POA: Diagnosis not present

## 2020-01-26 DIAGNOSIS — E119 Type 2 diabetes mellitus without complications: Secondary | ICD-10-CM | POA: Diagnosis not present

## 2020-01-26 DIAGNOSIS — E785 Hyperlipidemia, unspecified: Secondary | ICD-10-CM | POA: Diagnosis not present

## 2020-01-26 DIAGNOSIS — N183 Chronic kidney disease, stage 3 unspecified: Secondary | ICD-10-CM | POA: Diagnosis not present

## 2020-01-26 MED ORDER — PIOGLITAZONE HCL 30 MG PO TABS: 30.0000 mg | ORAL_TABLET | Freq: Every day | ORAL | 3 refills | Status: DC

## 2020-01-26 NOTE — Progress Notes (Signed)
Subjective:    Patient ID: Kelsey Keller, female    DOB: October 02, 1940, 79 y.o.   MRN: [id number]  HPI  Here to f/u; overall doing ok,  Pt denies chest pain, increasing sob or doe, wheezing, orthopnea, PND, increased LE swelling, palpitations, dizziness or syncope.  Pt denies new neurological symptoms such as new headache, or facial or extremity weakness or numbness.  Pt denies polydipsia, polyuria, but has had several low sugar episodes in the past 2 wks.  Pt states overall good compliance with meds.  Also has recurring leg cramps despite mustard use.  Also wants to be DNR and asks for out of hospital form to be signed Past Medical History:  Diagnosis Date  . ALLERGIC RHINITIS 04/29/2007  . Allergy   . Basal cell carcinoma 03/24/2018   sclerosis- upper bridge of nose (MOHS)  . BCC (basal cell carcinoma) 10/18/2019   right supra orbital region- Cx3 6f  . Cataract    both eyes  . DIABETES MELLITUS, TYPE II 06/23/2007   type 2  . Elevated liver enzymes 03/2017  . Elevated tumor markers   . GERD (gastroesophageal reflux disease)   . HIATAL HERNIA, HX OF 04/29/2007  . HYPERLIPIDEMIA 04/29/2007   diet controlled on CoQ10  . HYPERTENSION 04/29/2007  . HYPOKALEMIA 05/02/2009  . HYPOTHYROIDISM 04/29/2007  . LOW BACK PAIN 04/29/2007  . Neuromuscular disorder ([place]    diabetic neuropathy in both legs  . OSTEOARTHRITIS, KNEE, LEFT 06/23/2007  . OSTEOPOROSIS NOS 05/12/2007  . SPINAL STENOSIS, LUMBAR 04/29/2007   Past Surgical History:  Procedure Laterality Date  . BACK SURGERY N/A 2014 x 2   T1 TO S 1   . CATARACT EXTRACTION W/ INTRAOCULAR LENS  IMPLANT, BILATERAL Bilateral 05/2011   doing well  . COLONOSCOPY WITH PROPOFOL N/A 04/10/2017   Procedure: COLONOSCOPY WITH PROPOFOL;  Surgeon: JMilus Banister MD;  Location: [facility];  Service: Endoscopy;  Laterality: N/A;  . COLONSCOPY  2011  . TOTAL KNEE ARTHROPLASTY  2006   right  . TOTAL KNEE ARTHROPLASTY Left 2012    reports that  she has never smoked. She has never used smokeless tobacco. She reports current alcohol use. She reports that she does not use drugs. family history includes Arthritis in her brother and mother; COPD in her brother; Colon polyps in an other family member; Coronary artery disease in her father and another family member; Diabetes in an other family member; Hyperlipidemia in her brother; Hypertension in her mother; Stroke in her brother. No Known Allergies Current Outpatient Medications on File Prior to Visit  Medication Sig Dispense Refill  . aspirin 81 MG EC tablet Take 81 mg by mouth daily.      .Marland Kitchenatenolol (TENORMIN) 50 MG tablet Take 1 tablet (50 mg total) by mouth daily. 90 tablet 3  . blood glucose meter kit and supplies KIT Dispense with patient  Insurance; Use up to four times daily as directed. E11.9 1 each 0  . Blood Glucose Monitoring Suppl (ONE TOUCH ULTRA 2) w/Device KIT Use as directed up to 4 times per day E11.9 1 each 0  . Calcium Carb-Cholecalciferol (CALCIUM 600+D3) 600-800 MG-UNIT TABS Take 1 tablet by mouth every evening.    . chlorthalidone (HYGROTON) 25 MG tablet Take 1 tablet (25 mg total) by mouth daily. 90 tablet 3  . Co-Enzyme Q-10 100 MG CAPS Take 100 mg by mouth daily.    . fluticasone (FLONASE) 50 MCG/ACT nasal spray Place 2 sprays into both  nostrils daily. 48 g 3  . glucose blood (ONE TOUCH ULTRA TEST) test strip Use as instructed up to 4 times per day  E11.9 100 each 12  . Lancets MISC Use as directed up to four times per day E11.9 200 each 11  . levothyroxine (SYNTHROID) 125 MCG tablet Take 1 tablet (125 mcg total) by mouth daily before breakfast. 90 tablet 3  . meclizine (ANTIVERT) 12.5 MG tablet Take 1 tablet (12.5 mg total) by mouth 3 (three) times daily as needed for dizziness. 90 tablet 1  . meloxicam (MOBIC) 7.5 MG tablet TAKE 1 TABLET BY MOUTH EVERY DAY AS NEEDED FOR PAIN 90 tablet 3  . metFORMIN (GLUCOPHAGE) 1000 MG tablet Take 1 tablet (1,000 mg total) by mouth  2 (two) times daily with a meal. 180 tablet 3  . omeprazole (PRILOSEC) 40 MG capsule Take 1 capsule (40 mg total) by mouth daily. 1 tab by mouth daily 90 capsule 3  . Red Yeast Rice Extract 600 MG CAPS Take 1,200 mg by mouth 2 (two) times daily.    . traMADol (ULTRAM) 50 MG tablet Take 1 tablet (50 mg total) by mouth every 8 (eight) hours as needed. 90 tablet 5  . levothyroxine (SYNTHROID) 125 MCG tablet Take by mouth.    . levothyroxine (SYNTHROID) 175 MCG tablet Take 175 mcg by mouth every morning.     No current facility-administered medications on file prior to visit.   Review of Systems All otherwise neg per pt    Objective:   Physical Exam BP 138/72 (BP Location: Left Arm, Patient Position: Sitting, Cuff Size: Large)   Pulse 65   Temp 98.5 F (36.9 C) (Oral)   Ht _0  (1.626 m)   Wt 237 lb (107.5 kg)   SpO2 97%   BMI 40.68 kg/m  VS noted,  Constitutional: Pt appears in NAD HENT: Head: NCAT.  Right Ear: External ear normal.  Left Ear: External ear normal.  Eyes: . Pupils are equal, round, and reactive to light. Conjunctivae and EOM are normal Nose: without d/c or deformity Neck: Neck supple. Gross normal ROM Cardiovascular: Normal rate and regular rhythm.   Pulmonary/Chest: Effort normal and breath sounds without rales or wheezing.  Abd:  Soft, NT, ND, + BS, no organomegaly Neurological: Pt is alert. At baseline orientation, motor grossly intact Skin: Skin is warm. No rashes, other new lesions, no LE edema Psychiatric: Pt behavior is normal without agitation  All otherwise neg per pt Lab Results  Component Value Date   WBC 4.7 07/22/2019   HGB 12.9 07/22/2019   HCT 38.4 07/22/2019   PLT 132.0 (L) 07/22/2019   GLUCOSE 155 (H) 01/26/2020   CHOL 167 01/26/2020   TRIG 118 01/26/2020   HDL 72 01/26/2020   LDLDIRECT 107.5 07/21/2013   LDLCALC 75 01/26/2020   ALT 22 01/26/2020   AST 29 01/26/2020   NA 141 01/26/2020   K 3.9 01/26/2020   CL 102 01/26/2020    CREATININE 1.20 (H) 01/26/2020   BUN 33 (H) 01/26/2020   CO2 33 (H) 01/26/2020   TSH 2.35 01/20/2020   INR 1.0 07/22/2019   HGBA1C 6.9 (H) 01/26/2020   MICROALBUR 4.0 (H) 07/22/2019      Assessment & Plan:

## 2020-01-26 NOTE — Patient Instructions (Signed)
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 for the DNR form to be signed  Call if you change your mind about the muscle relaxer at bedtime  Please continue all other medications as before, and refills have been done if requested.  Please have the pharmacy call with any other refills you may need.  Please continue your efforts at being more active, low cholesterol diet, and weight control.  Please keep your appointments with your specialists as you may have planned  Please go to the LAB at the blood drawing area for the tests to be done - the sugar labs  You will be contacted by phone if any changes need to be made immediately.  Otherwise, you will receive a letter about your results with an explanation, but please check with MyChart first.  Please remember to sign up for MyChart if you have not done so, as this will be important to you in the future with finding out test results, communicating by private email, and scheduling acute appointments online when needed.  Please make an Appointment to return in 6 months, or sooner if needed, also with Lab Appointment for testing done 3-5 days before at the [facility] (so this is for TWO appointments - please see the scheduling desk as you leave)

## 2020-01-27 ENCOUNTER — Encounter: Payer: Self-pay | Admitting: Internal Medicine

## 2020-01-27 LAB — LIPID PANEL
Cholesterol: 167 mg/dL (ref <200)
HDL: 72 mg/dL (ref >=50)
LDL Cholesterol (Calc): 75 mg/dL (calc)
Non-HDL Cholesterol (Calc): 95 mg/dL (calc) (ref <130)
Total CHOL/HDL Ratio: 2.3 (calc) (ref <5.0)
Triglycerides: 118 mg/dL (ref <150)

## 2020-01-27 LAB — HEPATIC FUNCTION PANEL
AG Ratio: 1.8 (calc) (ref 1.0–2.5)
ALT: 22 U/L (ref 6–29)
AST: 29 U/L (ref 10–35)
Albumin: 3.9 g/dL (ref 3.6–5.1)
Alkaline phosphatase (APISO): 79 U/L (ref 37–153)
Bilirubin, Direct: 0.3 mg/dL — ABNORMAL HIGH (ref 0.0–0.2)
Globulin: 2.2 g/dL (calc) (ref 1.9–3.7)
Indirect Bilirubin: 0.8 mg/dL (calc) (ref 0.2–1.2)
Total Bilirubin: 1.1 mg/dL (ref 0.2–1.2)
Total Protein: 6.1 g/dL (ref 6.1–8.1)

## 2020-01-27 LAB — BASIC METABOLIC PANEL
BUN/Creatinine Ratio: 28 (calc) — ABNORMAL HIGH (ref 6–22)
BUN: 33 mg/dL — ABNORMAL HIGH (ref 7–25)
CO2: 33 mmol/L — ABNORMAL HIGH (ref 20–32)
Calcium: 10 mg/dL (ref 8.6–10.4)
Chloride: 102 mmol/L (ref 98–110)
Creat: 1.2 mg/dL — ABNORMAL HIGH (ref 0.60–0.93)
Glucose, Bld: 155 mg/dL — ABNORMAL HIGH (ref 65–99)
Potassium: 3.9 mmol/L (ref 3.5–5.3)
Sodium: 141 mmol/L (ref 135–146)

## 2020-01-27 LAB — HEMOGLOBIN A1C
Hgb A1c MFr Bld: 6.9 % of total Hgb — ABNORMAL HIGH (ref <5.7)
Mean Plasma Glucose: 151 (calc)
eAG (mmol/L): 8.4 (calc)

## 2020-01-28 ENCOUNTER — Ambulatory Visit (HOSPITAL_COMMUNITY): Payer: Medicare HMO

## 2020-01-29 ENCOUNTER — Other Ambulatory Visit: Payer: Self-pay | Admitting: Internal Medicine

## 2020-01-29 DIAGNOSIS — R1013 Epigastric pain: Secondary | ICD-10-CM

## 2020-01-29 DIAGNOSIS — K2970 Gastritis, unspecified, without bleeding: Secondary | ICD-10-CM

## 2020-01-29 DIAGNOSIS — K269 Duodenal ulcer, unspecified as acute or chronic, without hemorrhage or perforation: Secondary | ICD-10-CM

## 2020-01-29 NOTE — Telephone Encounter (Signed)
Please refill as per office routine med refill policy (all routine meds refilled for 3 mo or monthly per pt preference up to one year from last visit, then month to month grace period for 3 mo, then further med refills will have to be denied)  

## 2020-01-30 ENCOUNTER — Encounter: Payer: Self-pay | Admitting: Internal Medicine

## 2020-01-30 NOTE — Assessment & Plan Note (Signed)
likey overcontrolled, d/c amaryl, increased actos 30 qd, cont to monitor

## 2020-01-30 NOTE — Assessment & Plan Note (Signed)
stable overall by history and exam, recent data reviewed with pt, and pt to continue medical treatment as before,  to f/u any worsening symptoms or concerns  

## 2020-01-31 ENCOUNTER — Ambulatory Visit (HOSPITAL_COMMUNITY)
Admission: RE | Admit: 2020-01-31 | Discharge: 2020-01-31 | Disposition: A | Discharge status: Home / Self Care | Payer: Medicare HMO | Source: Ambulatory Visit | Attending: Gastroenterology | Admitting: Gastroenterology

## 2020-01-31 ENCOUNTER — Other Ambulatory Visit: Payer: Self-pay

## 2020-01-31 ENCOUNTER — Other Ambulatory Visit (INDEPENDENT_AMBULATORY_CARE_PROVIDER_SITE_OTHER): Payer: Medicare HMO

## 2020-01-31 DIAGNOSIS — K7460 Unspecified cirrhosis of liver: Secondary | ICD-10-CM | POA: Diagnosis not present

## 2020-01-31 DIAGNOSIS — K8020 Calculus of gallbladder without cholecystitis without obstruction: Secondary | ICD-10-CM | POA: Diagnosis not present

## 2020-01-31 LAB — CBC WITH DIFFERENTIAL/PLATELET
Basophils Absolute: 0 10*3/uL (ref 0.0–0.1)
Basophils Relative: 0.9 % (ref 0.0–3.0)
Eosinophils Absolute: 0.2 10*3/uL (ref 0.0–0.7)
Eosinophils Relative: 4.2 % (ref 0.0–5.0)
HCT: 36.3 % (ref 36.0–46.0)
Hemoglobin: 12.4 g/dL (ref 12.0–15.0)
Lymphocytes Relative: 17.9 % (ref 12.0–46.0)
Lymphs Abs: 0.7 10*3/uL (ref 0.7–4.0)
MCHC: 34.2 g/dL (ref 30.0–36.0)
MCV: 94.8 fl (ref 78.0–100.0)
Monocytes Absolute: 0.4 10*3/uL (ref 0.1–1.0)
Monocytes Relative: 10.9 % (ref 3.0–12.0)
Neutro Abs: 2.7 10*3/uL (ref 1.4–7.7)
Neutrophils Relative %: 66.1 % (ref 43.0–77.0)
Platelets: 156 10*3/uL (ref 150.0–400.0)
RBC: 3.83 Mil/uL — ABNORMAL LOW (ref 3.87–5.11)
RDW: 14.6 % (ref 11.5–15.5)
WBC: 4.1 10*3/uL (ref 4.0–10.5)

## 2020-01-31 LAB — COMPREHENSIVE METABOLIC PANEL
ALT: 21 U/L (ref 0–35)
AST: 30 U/L (ref 0–37)
Albumin: 3.9 g/dL (ref 3.5–5.2)
Alkaline Phosphatase: 75 U/L (ref 39–117)
BUN: 29 mg/dL — ABNORMAL HIGH (ref 6–23)
CO2: 31 mEq/L (ref 19–32)
Calcium: 10.2 mg/dL (ref 8.4–10.5)
Chloride: 101 mEq/L (ref 96–112)
Creatinine, Ser: 1.11 mg/dL (ref 0.40–1.20)
GFR: 47.63 mL/min — ABNORMAL LOW (ref >60.00)
Glucose, Bld: 169 mg/dL — ABNORMAL HIGH (ref 70–99)
Potassium: 4.1 mEq/L (ref 3.5–5.1)
Sodium: 138 mEq/L (ref 135–145)
Total Bilirubin: 1.3 mg/dL — ABNORMAL HIGH (ref 0.2–1.2)
Total Protein: 6.5 g/dL (ref 6.0–8.3)

## 2020-03-15 DIAGNOSIS — G5703 Lesion of sciatic nerve, bilateral lower limbs: Secondary | ICD-10-CM | POA: Diagnosis not present

## 2020-04-05 ENCOUNTER — Other Ambulatory Visit: Payer: Self-pay | Admitting: Internal Medicine

## 2020-04-05 MED ORDER — HYDROCHLOROTHIAZIDE 25 MG PO TABS: 25.0000 mg | ORAL_TABLET | Freq: Every day | ORAL | 3 refills | Status: DC

## 2020-04-05 NOTE — Telephone Encounter (Signed)
[place] for change to hct 25 mg only - done erx

## 2020-04-05 NOTE — Telephone Encounter (Signed)
Sent to Dr. Jenny Reichmann with note from pharmacy to change med.

## 2020-04-10 DIAGNOSIS — R69 Illness, unspecified: Secondary | ICD-10-CM | POA: Diagnosis not present

## 2020-04-13 DIAGNOSIS — M19071 Primary osteoarthritis, right ankle and foot: Secondary | ICD-10-CM | POA: Diagnosis not present

## 2020-04-13 DIAGNOSIS — M7918 Myalgia, other site: Secondary | ICD-10-CM | POA: Diagnosis not present

## 2020-04-13 DIAGNOSIS — G8929 Other chronic pain: Secondary | ICD-10-CM | POA: Diagnosis not present

## 2020-04-13 DIAGNOSIS — M25571 Pain in right ankle and joints of right foot: Secondary | ICD-10-CM | POA: Diagnosis not present

## 2020-04-13 DIAGNOSIS — M7731 Calcaneal spur, right foot: Secondary | ICD-10-CM | POA: Diagnosis not present

## 2020-04-13 DIAGNOSIS — M11271 Other chondrocalcinosis, right ankle and foot: Secondary | ICD-10-CM | POA: Diagnosis not present

## 2020-04-19 DIAGNOSIS — E039 Hypothyroidism, unspecified: Secondary | ICD-10-CM | POA: Diagnosis not present

## 2020-04-19 DIAGNOSIS — K219 Gastro-esophageal reflux disease without esophagitis: Secondary | ICD-10-CM | POA: Diagnosis not present

## 2020-04-19 DIAGNOSIS — E1143 Type 2 diabetes mellitus with diabetic autonomic (poly)neuropathy: Secondary | ICD-10-CM | POA: Diagnosis not present

## 2020-04-19 DIAGNOSIS — E1165 Type 2 diabetes mellitus with hyperglycemia: Secondary | ICD-10-CM | POA: Diagnosis not present

## 2020-04-19 DIAGNOSIS — J309 Allergic rhinitis, unspecified: Secondary | ICD-10-CM | POA: Diagnosis not present

## 2020-04-19 DIAGNOSIS — E1140 Type 2 diabetes mellitus with diabetic neuropathy, unspecified: Secondary | ICD-10-CM | POA: Diagnosis not present

## 2020-04-19 DIAGNOSIS — K7460 Unspecified cirrhosis of liver: Secondary | ICD-10-CM | POA: Diagnosis not present

## 2020-04-19 DIAGNOSIS — G8929 Other chronic pain: Secondary | ICD-10-CM | POA: Diagnosis not present

## 2020-04-19 DIAGNOSIS — I10 Essential (primary) hypertension: Secondary | ICD-10-CM | POA: Diagnosis not present

## 2020-05-04 DIAGNOSIS — R69 Illness, unspecified: Secondary | ICD-10-CM | POA: Diagnosis not present

## 2020-05-22 DIAGNOSIS — M19071 Primary osteoarthritis, right ankle and foot: Secondary | ICD-10-CM | POA: Diagnosis not present

## 2020-05-22 DIAGNOSIS — E119 Type 2 diabetes mellitus without complications: Secondary | ICD-10-CM | POA: Diagnosis not present

## 2020-06-22 DIAGNOSIS — E119 Type 2 diabetes mellitus without complications: Secondary | ICD-10-CM | POA: Diagnosis not present

## 2020-06-22 DIAGNOSIS — G5703 Lesion of sciatic nerve, bilateral lower limbs: Secondary | ICD-10-CM | POA: Diagnosis not present

## 2020-07-04 ENCOUNTER — Other Ambulatory Visit: Payer: Self-pay | Admitting: Internal Medicine

## 2020-07-04 NOTE — Telephone Encounter (Signed)
Please refill as per office routine med refill policy (all routine meds refilled for 3 mo or monthly per pt preference up to one year from last visit, then month to month grace period for 3 mo, then further med refills will have to be denied)  

## 2020-07-26 ENCOUNTER — Telehealth: Payer: Self-pay | Admitting: Internal Medicine

## 2020-07-26 DIAGNOSIS — E538 Deficiency of other specified B group vitamins: Secondary | ICD-10-CM

## 2020-07-26 DIAGNOSIS — E1129 Type 2 diabetes mellitus with other diabetic kidney complication: Secondary | ICD-10-CM

## 2020-07-26 DIAGNOSIS — N1831 Chronic kidney disease, stage 3a: Secondary | ICD-10-CM

## 2020-07-26 NOTE — Telephone Encounter (Signed)
Pt has CPE 1/18 and lab appt 1/13. I only see a T4 ordered, so wanted to check if you wanted to add additional lab orders before her appt tomorrow.

## 2020-07-26 NOTE — Telephone Encounter (Signed)
Ok labs ordered 

## 2020-07-27 ENCOUNTER — Other Ambulatory Visit (INDEPENDENT_AMBULATORY_CARE_PROVIDER_SITE_OTHER): Payer: Medicare HMO

## 2020-07-27 ENCOUNTER — Other Ambulatory Visit: Payer: Self-pay

## 2020-07-27 DIAGNOSIS — E538 Deficiency of other specified B group vitamins: Secondary | ICD-10-CM | POA: Diagnosis not present

## 2020-07-27 DIAGNOSIS — N1831 Chronic kidney disease, stage 3a: Secondary | ICD-10-CM

## 2020-07-27 DIAGNOSIS — E1129 Type 2 diabetes mellitus with other diabetic kidney complication: Secondary | ICD-10-CM | POA: Diagnosis not present

## 2020-07-27 DIAGNOSIS — R809 Proteinuria, unspecified: Secondary | ICD-10-CM

## 2020-07-27 LAB — MICROALBUMIN / CREATININE URINE RATIO
Creatinine,U: 269.7 mg/dL
Microalb Creat Ratio: 6.6 mg/g (ref 0.0–30.0)
Microalb, Ur: 17.8 mg/dL — ABNORMAL HIGH (ref 0.0–1.9)

## 2020-07-27 LAB — HEMOGLOBIN A1C: Hgb A1c MFr Bld: 9.2 % — ABNORMAL HIGH (ref 4.6–6.5)

## 2020-07-27 LAB — BASIC METABOLIC PANEL
BUN: 23 mg/dL (ref 6–23)
CO2: 31 mEq/L (ref 19–32)
Calcium: 9.9 mg/dL (ref 8.4–10.5)
Chloride: 101 mEq/L (ref 96–112)
Creatinine, Ser: 1.01 mg/dL (ref 0.40–1.20)
GFR: 53.6 mL/min — ABNORMAL LOW (ref >60.00)
Glucose, Bld: 189 mg/dL — ABNORMAL HIGH (ref 70–99)
Potassium: 4 mEq/L (ref 3.5–5.1)
Sodium: 138 mEq/L (ref 135–145)

## 2020-07-27 LAB — URINALYSIS, ROUTINE W REFLEX MICROSCOPIC
Nitrite: NEGATIVE
Specific Gravity, Urine: >=1.03 — AB (ref 1.000–1.030)
Total Protein, Urine: 30 — AB
Urine Glucose: 100 — AB
Urobilinogen, UA: 0.2 (ref 0.0–1.0)
pH: 5.5 (ref 5.0–8.0)

## 2020-07-27 LAB — CBC WITH DIFFERENTIAL/PLATELET
Basophils Absolute: 0 10*3/uL (ref 0.0–0.1)
Basophils Relative: 0.7 % (ref 0.0–3.0)
Eosinophils Absolute: 0.1 10*3/uL (ref 0.0–0.7)
Eosinophils Relative: 2.4 % (ref 0.0–5.0)
HCT: 36.7 % (ref 36.0–46.0)
Hemoglobin: 12.5 g/dL (ref 12.0–15.0)
Lymphocytes Relative: 23.3 % (ref 12.0–46.0)
Lymphs Abs: 0.9 10*3/uL (ref 0.7–4.0)
MCHC: 34 g/dL (ref 30.0–36.0)
MCV: 92.7 fl (ref 78.0–100.0)
Monocytes Absolute: 0.3 10*3/uL (ref 0.1–1.0)
Monocytes Relative: 7.1 % (ref 3.0–12.0)
Neutro Abs: 2.6 10*3/uL (ref 1.4–7.7)
Neutrophils Relative %: 66.5 % (ref 43.0–77.0)
Platelets: 125 10*3/uL — ABNORMAL LOW (ref 150.0–400.0)
RBC: 3.96 Mil/uL (ref 3.87–5.11)
RDW: 15.1 % (ref 11.5–15.5)
WBC: 4 10*3/uL (ref 4.0–10.5)

## 2020-07-27 LAB — PHOSPHORUS: Phosphorus: 2.7 mg/dL (ref 2.3–4.6)

## 2020-07-27 LAB — LIPID PANEL
Cholesterol: 154 mg/dL (ref 0–200)
HDL: 74.4 mg/dL (ref >39.00)
LDL Cholesterol: 60 mg/dL (ref 0–99)
NonHDL: 80.05
Total CHOL/HDL Ratio: 2
Triglycerides: 98 mg/dL (ref 0.0–149.0)
VLDL: 19.6 mg/dL (ref 0.0–40.0)

## 2020-07-27 LAB — HEPATIC FUNCTION PANEL
ALT: 22 U/L (ref 0–35)
AST: 28 U/L (ref 0–37)
Albumin: 3.9 g/dL (ref 3.5–5.2)
Alkaline Phosphatase: 86 U/L (ref 39–117)
Bilirubin, Direct: 0.3 mg/dL (ref 0.0–0.3)
Total Bilirubin: 1.2 mg/dL (ref 0.2–1.2)
Total Protein: 6.4 g/dL (ref 6.0–8.3)

## 2020-07-27 LAB — VITAMIN D 25 HYDROXY (VIT D DEFICIENCY, FRACTURES): VITD: 35.35 ng/mL (ref 30.00–100.00)

## 2020-07-27 LAB — TSH: TSH: 4.8 u[IU]/mL — ABNORMAL HIGH (ref 0.35–4.50)

## 2020-07-27 LAB — VITAMIN B12: Vitamin B-12: 1293 pg/mL — ABNORMAL HIGH (ref 211–911)

## 2020-07-28 ENCOUNTER — Other Ambulatory Visit: Payer: Medicare HMO

## 2020-07-31 LAB — PTH, INTACT AND CALCIUM
Calcium: 9.9 mg/dL (ref 8.6–10.4)
PTH: 19 pg/mL (ref 14–64)

## 2020-08-01 ENCOUNTER — Encounter: Payer: Medicare HMO | Admitting: Internal Medicine

## 2020-08-02 ENCOUNTER — Other Ambulatory Visit: Payer: Self-pay | Admitting: Internal Medicine

## 2020-08-15 ENCOUNTER — Telehealth: Payer: Self-pay

## 2020-08-15 ENCOUNTER — Other Ambulatory Visit: Payer: Self-pay

## 2020-08-15 DIAGNOSIS — K7460 Unspecified cirrhosis of liver: Secondary | ICD-10-CM

## 2020-08-15 NOTE — Telephone Encounter (Signed)
-----   Message from Timothy Lasso, RN sent at 02/09/2020  9:15 AM EDT ----- I would like to see you in the office February 2022, 1 year since her last visit.  Shortly prior to that we will arrange blood work and ultrasound again.

## 2020-08-15 NOTE — Telephone Encounter (Signed)
Lab order entered   You have been scheduled for an abdominal ultrasound at [facility] (1st floor of hospital) on 08/29/20 at 830 am. Please arrive 15 minutes prior to your appointment for registration. Make certain not to have anything to eat or drink after midnight. Should you need to reschedule your appointment, please contact radiology at [phone]. This test typically takes about 30 minutes to perform.  ROV scheduled for 09/20/20 at 830 am  The patient has been notified of this information and all questions answered. All information has been sent to the pt via My Chart as well.

## 2020-08-17 ENCOUNTER — Ambulatory Visit (INDEPENDENT_AMBULATORY_CARE_PROVIDER_SITE_OTHER): Payer: Medicare HMO | Admitting: Internal Medicine

## 2020-08-17 ENCOUNTER — Other Ambulatory Visit: Payer: Self-pay

## 2020-08-17 ENCOUNTER — Encounter: Payer: Self-pay | Admitting: Internal Medicine

## 2020-08-17 VITALS — BP 160/80 | HR 65 | Temp 98.2°F | Ht 64.0 in | Wt 221.0 lb

## 2020-08-17 DIAGNOSIS — E785 Hyperlipidemia, unspecified: Secondary | ICD-10-CM | POA: Diagnosis not present

## 2020-08-17 DIAGNOSIS — G8929 Other chronic pain: Secondary | ICD-10-CM | POA: Diagnosis not present

## 2020-08-17 DIAGNOSIS — E559 Vitamin D deficiency, unspecified: Secondary | ICD-10-CM

## 2020-08-17 DIAGNOSIS — M25571 Pain in right ankle and joints of right foot: Secondary | ICD-10-CM

## 2020-08-17 DIAGNOSIS — E538 Deficiency of other specified B group vitamins: Secondary | ICD-10-CM | POA: Diagnosis not present

## 2020-08-17 DIAGNOSIS — N183 Chronic kidney disease, stage 3 unspecified: Secondary | ICD-10-CM | POA: Diagnosis not present

## 2020-08-17 DIAGNOSIS — I10 Essential (primary) hypertension: Secondary | ICD-10-CM

## 2020-08-17 DIAGNOSIS — Z0000 Encounter for general adult medical examination without abnormal findings: Secondary | ICD-10-CM | POA: Diagnosis not present

## 2020-08-17 DIAGNOSIS — E119 Type 2 diabetes mellitus without complications: Secondary | ICD-10-CM | POA: Diagnosis not present

## 2020-08-17 DIAGNOSIS — Z0001 Encounter for general adult medical examination with abnormal findings: Secondary | ICD-10-CM

## 2020-08-17 MED ORDER — GLIMEPIRIDE 4 MG PO TABS: ORAL_TABLET | ORAL | 3 refills | Status: DC

## 2020-08-17 NOTE — Patient Instructions (Signed)
Ok to take the B12 supplement twice per day  Ok to continue the Vitamin D3 OTC at 2000 units per day  Please restart the glimeparide at 4 mg twice per day  Please continue all other medications as before, and refills have been done if requested.  Please have the pharmacy call with any other refills you may need.  Please continue your efforts at being more active, low cholesterol diet, and weight control.  You are otherwise up to date with prevention measures today.  Please keep your appointments with your specialists as you may have planned  You will be contacted regarding the referral for: Dr Doran Durand - orthopedic for the right ankle  Please make an Appointment to return in 6 months, or sooner if needed, also with Lab Appointment for testing done 3-5 days before at the [place] (so this is for TWO appointments - please see the scheduling desk as you leave)  Due to the ongoing Covid 19 pandemic, our lab now requires an appointment for any labs done at our office.  If you need labs done and do not have an appointment, please call our office ahead of time to schedule before presenting to the lab for your testing.

## 2020-08-17 NOTE — Progress Notes (Signed)
 Established Patient Office Visit  Subjective:  Patient ID: Kelsey Keller, female    DOB: 02/23/1941  Age: 79 y.o. MRN: [id number]          Chief Complaint:: wellness exam and right ankle pain, HTN, DM, low b12, low vit d       HPI:  Kelsey Keller is a 79 y.o. female here for wellness exam, lost 15 lbs with better diet. O/w up to date with preventive measures.  Pt states BP at home average is less than 140/90.     Wt Readings from Last 3 Encounters:  08/17/20 221 lb (100.2 kg)  01/26/20 237 lb (107.5 kg)  01/20/20 235 lb 12.8 oz (107 kg)   BP Readings from Last 3 Encounters:  08/17/20 (!) 160/80  01/26/20 138/72  01/20/20 140/70   Immunization History  Administered Date(s) Administered  . Influenza, High Dose Seasonal PF 04/15/2018, 03/20/2019, 04/10/2020  . Influenza-Unspecified 05/05/2017, 04/15/2018  . PFIZER(Purple Top)SARS-COV-2 Vaccination 07/30/2019, 08/20/2019, 04/17/2020  . Pneumococcal Conjugate-13 04/15/2013  . Pneumococcal Polysaccharide-23 04/27/2008  . Td 10/25/2008  . Tdap 01/26/2019  . Zoster 10/20/2007  There are no preventive care reminders to display for this patient.       Also c/o persistent but worsening now moderate to occasionally severe right ankle pain with intermittent mild swelling, worse to walk, better to sit, without hx of recent injury or trauma.  No falls, though admits to worsening balance issue recently. Pt denies chest pain, increased sob or doe, wheezing, orthopnea, PND, increased LE swelling, palpitations, dizziness or syncope.  Pt denies new neurological symptoms such as new headache, or facial or extremity weakness or numbness   Pt denies polydipsia, polyuria.  For some reason has not been takign the glimeparide 4 mg bid, but willing to restart  Past Medical History:  Diagnosis Date  . ALLERGIC RHINITIS 04/29/2007  . Allergy   . Basal cell carcinoma 03/24/2018   sclerosis- upper bridge of nose (MOHS)  . BCC (basal cell carcinoma)  10/18/2019   right supra orbital region- Cx3 5fu  . Cataract    both eyes  . DIABETES MELLITUS, TYPE II 06/23/2007   type 2  . Elevated liver enzymes 03/2017  . Elevated tumor markers   . GERD (gastroesophageal reflux disease)   . HIATAL HERNIA, HX OF 04/29/2007  . HYPERLIPIDEMIA 04/29/2007   diet controlled on CoQ10  . HYPERTENSION 04/29/2007  . HYPOKALEMIA 05/02/2009  . HYPOTHYROIDISM 04/29/2007  . LOW BACK PAIN 04/29/2007  . Neuromuscular disorder (HCC)    diabetic neuropathy in both legs  . OSTEOARTHRITIS, KNEE, LEFT 06/23/2007  . OSTEOPOROSIS NOS 05/12/2007  . SPINAL STENOSIS, LUMBAR 04/29/2007   Past Surgical History:  Procedure Laterality Date  . BACK SURGERY N/A 2014 x 2   T1 TO S 1   . CATARACT EXTRACTION W/ INTRAOCULAR LENS  IMPLANT, BILATERAL Bilateral 05/2011   doing well  . COLONOSCOPY WITH PROPOFOL N/A 04/10/2017   Procedure: COLONOSCOPY WITH PROPOFOL;  Surgeon: Jacobs, Daniel P, MD;  Location: [facility];  Service: Endoscopy;  Laterality: N/A;  . COLONSCOPY  2011  . TOTAL KNEE ARTHROPLASTY  2006   right  . TOTAL KNEE ARTHROPLASTY Left 2012    reports that she has never smoked. She has never used smokeless tobacco. She reports current alcohol use. She reports that she does not use drugs. family history includes Arthritis in her brother and mother; COPD in her brother; Colon polyps in an other family member; Coronary artery   disease in her father and another family member; Diabetes in an other family member; Hyperlipidemia in her brother; Hypertension in her mother; Stroke in her brother. No Known Allergies Current Outpatient Medications on File Prior to Visit  Medication Sig Dispense Refill  . aspirin 81 MG EC tablet Take 81 mg by mouth daily.    . atenolol (TENORMIN) 50 MG tablet Take 1 tablet (50 mg total) by mouth daily. 90 tablet 3  . blood glucose meter kit and supplies KIT Dispense with patient  Insurance; Use up to four times daily as directed. E11.9 1  each 0  . Blood Glucose Monitoring Suppl (ONE TOUCH ULTRA 2) w/Device KIT Use as directed up to 4 times per day E11.9 1 each 0  . Co-Enzyme Q-10 100 MG CAPS Take 100 mg by mouth daily.    . fluticasone (FLONASE) 50 MCG/ACT nasal spray Place 2 sprays into both nostrils daily. 48 g 3  . glucose blood (ONE TOUCH ULTRA TEST) test strip Use as instructed up to 4 times per day  E11.9 100 each 12  . hydrochlorothiazide (HYDRODIURIL) 25 MG tablet Take 1 tablet (25 mg total) by mouth daily. 90 tablet 3  . Lancets MISC Use as directed up to four times per day E11.9 200 each 11  . levothyroxine (SYNTHROID) 125 MCG tablet Take 1 tablet (125 mcg total) by mouth daily before breakfast. 90 tablet 3  . meclizine (ANTIVERT) 12.5 MG tablet Take 1 tablet (12.5 mg total) by mouth 3 (three) times daily as needed for dizziness. 90 tablet 1  . meloxicam (MOBIC) 7.5 MG tablet TAKE 1 TABLET BY MOUTH EVERY DAY AS NEEDED FOR PAIN 90 tablet 3  . metFORMIN (GLUCOPHAGE) 1000 MG tablet TAKE 1 TABLET BY MOUTH TWICE A DAY WITH A MEAL 180 tablet 3  . omeprazole (PRILOSEC) 40 MG capsule TAKE 1 CAPSULE BY MOUTH TWICE A DAY 180 capsule 3  . pioglitazone (ACTOS) 30 MG tablet Take 1 tablet (30 mg total) by mouth daily. 90 tablet 3  . Red Yeast Rice Extract 600 MG CAPS Take 1,200 mg by mouth 2 (two) times daily.    . traMADol (ULTRAM) 50 MG tablet TAKE 1 TABLET BY MOUTH EVERY 8 HOURS AS NEEDED 90 tablet 4  . Calcium Carb-Cholecalciferol (CALCIUM 600+D3) 600-800 MG-UNIT TABS Take 1 tablet by mouth every evening.    . levothyroxine (SYNTHROID) 125 MCG tablet Take by mouth.    . levothyroxine (SYNTHROID) 175 MCG tablet Take 175 mcg by mouth every morning.     No current facility-administered medications on file prior to visit.        ROS:  All others reviewed and negative.  Objective        PE:  BP (!) 160/80   Pulse 65   Temp 98.2 F (36.8 C) (Oral)   Ht 5' 4" (1.626 m)   Wt 221 lb (100.2 kg)   SpO2 96%   BMI 37.93 kg/m                  Constitutional: Pt appears in NAD               HENT: Head: NCAT.                Right Ear: External ear normal.                 Left Ear: External ear normal.                  Eyes: . Pupils are equal, round, and reactive to light. Conjunctivae and EOM are normal               Nose: without d/c or deformity               Neck: Neck supple. Gross normal ROM               Cardiovascular: Normal rate and regular rhythm.                 Pulmonary/Chest: Effort normal and breath sounds without rales or wheezing.                Abd:  Soft, NT, ND, + BS, no organomegaly               Neurological: Pt is alert. At baseline orientation, motor grossly intact               Skin: Skin is warm. No rashes, no other new lesions, LE edema - none               Psychiatric: Pt behavior is normal without agitation   Micro: none  Cardiac tracings I have personally interpreted today:  none  Pertinent Radiological findings (summarize): none   Lab Results  Component Value Date   WBC 4.0 07/27/2020   HGB 12.5 07/27/2020   HCT 36.7 07/27/2020   PLT 125.0 (L) 07/27/2020   GLUCOSE 189 (H) 07/27/2020   CHOL 154 07/27/2020   TRIG 98.0 07/27/2020   HDL 74.40 07/27/2020   LDLDIRECT 107.5 07/21/2013   LDLCALC 60 07/27/2020   ALT 22 07/27/2020   AST 28 07/27/2020   NA 138 07/27/2020   K 4.0 07/27/2020   CL 101 07/27/2020   CREATININE 1.01 07/27/2020   BUN 23 07/27/2020   CO2 31 07/27/2020   TSH 4.80 (H) 07/27/2020   INR 1.0 07/22/2019   HGBA1C 9.2 (H) 07/27/2020   MICROALBUR 17.8 (H) 07/27/2020   Assessment/Plan:  Kelsey Keller is a 79 y.o. White or Caucasian [1] female with  has a past medical history of ALLERGIC RHINITIS (04/29/2007), Allergy, Basal cell carcinoma (03/24/2018), BCC (basal cell carcinoma) (10/18/2019), Cataract, DIABETES MELLITUS, TYPE II (06/23/2007), Elevated liver enzymes (03/2017), Elevated tumor markers, GERD (gastroesophageal reflux disease), HIATAL HERNIA, HX OF  (04/29/2007), HYPERLIPIDEMIA (04/29/2007), HYPERTENSION (04/29/2007), HYPOKALEMIA (05/02/2009), HYPOTHYROIDISM (04/29/2007), LOW BACK PAIN (04/29/2007), Neuromuscular disorder (HCC), OSTEOARTHRITIS, KNEE, LEFT (06/23/2007), OSTEOPOROSIS NOS (05/12/2007), and SPINAL STENOSIS, LUMBAR (04/29/2007). Encounter for well adult exam with abnormal findings Age and sex appropriate education and counseling updated with regular exercise and diet Referrals for preventative services - none needed Immunizations addressed - none needed Smoking counseling  - none needed Evidence for depression or other mood disorder - none significant Most recent labs reviewed. I have personally reviewed and have noted: 1) the patient's medical and social history 2) The patient's current medications and supplements 3) The patient's height, weight, and BMI have been recorded in the chart   B12 deficiency Overcontrolled, ok to reduce po supplement to twice per wk  Chronic pain of right ankle Ongoing significant, likely contributes to fall risk, for referral ortho Dr Hewitt  CKD (chronic kidney disease) stage 3, GFR 30-59 ml/min (HCC) Lab Results  Component Value Date   CREATININE 1.01 07/27/2020   Stable overall, cont to avoid nephrotoxins  Diabetes Lab Results  Component Value Date   HGBA1C 9.2 (H) 07/27/2020   Stable, pt to continue current medical treatment including   the metformin, actos and to restart the glimeparide 4 bid  Current Outpatient Medications (Endocrine & Metabolic):  .  glimepiride (AMARYL) 4 MG tablet, 1 tab by mouth twice per day .  levothyroxine (SYNTHROID) 125 MCG tablet, Take 1 tablet (125 mcg total) by mouth daily before breakfast. .  metFORMIN (GLUCOPHAGE) 1000 MG tablet, TAKE 1 TABLET BY MOUTH TWICE A DAY WITH A MEAL .  pioglitazone (ACTOS) 30 MG tablet, Take 1 tablet (30 mg total) by mouth daily. Marland Kitchen  levothyroxine (SYNTHROID) 125 MCG tablet, Take by mouth. .  levothyroxine (SYNTHROID) 175  MCG tablet, Take 175 mcg by mouth every morning.  Current Outpatient Medications (Cardiovascular):  .  atenolol (TENORMIN) 50 MG tablet, Take 1 tablet (50 mg total) by mouth daily. .  hydrochlorothiazide (HYDRODIURIL) 25 MG tablet, Take 1 tablet (25 mg total) by mouth daily.  Current Outpatient Medications (Respiratory):  .  fluticasone (FLONASE) 50 MCG/ACT nasal spray, Place 2 sprays into both nostrils daily.  Current Outpatient Medications (Analgesics):  .  aspirin 81 MG EC tablet, Take 81 mg by mouth daily. .  meloxicam (MOBIC) 7.5 MG tablet, TAKE 1 TABLET BY MOUTH EVERY DAY AS NEEDED FOR PAIN .  traMADol (ULTRAM) 50 MG tablet, TAKE 1 TABLET BY MOUTH EVERY 8 HOURS AS NEEDED   Current Outpatient Medications (Other):  .  blood glucose meter kit and supplies KIT, Dispense with patient  Insurance; Use up to four times daily as directed. E11.9 .  Blood Glucose Monitoring Suppl (ONE TOUCH ULTRA 2) w/Device KIT, Use as directed up to 4 times per day E11.9 .  Co-Enzyme Q-10 100 MG CAPS, Take 100 mg by mouth daily. Marland Kitchen  glucose blood (ONE TOUCH ULTRA TEST) test strip, Use as instructed up to 4 times per day  E11.9 .  Lancets MISC, Use as directed up to four times per day E11.9 .  meclizine (ANTIVERT) 12.5 MG tablet, Take 1 tablet (12.5 mg total) by mouth 3 (three) times daily as needed for dizziness. Marland Kitchen  omeprazole (PRILOSEC) 40 MG capsule, TAKE 1 CAPSULE BY MOUTH TWICE A DAY .  Red Yeast Rice Extract 600 MG CAPS, Take 1,200 mg by mouth 2 (two) times daily. .  Calcium Carb-Cholecalciferol (CALCIUM 600+D3) 600-800 MG-UNIT TABS, Take 1 tablet by mouth every evening.   Essential hypertension BP Readings from Last 3 Encounters:  08/17/20 (!) 160/80  01/26/20 138/72  01/20/20 140/70   Stable, pt to continue medical treatment  - hct, tenormin, as is adamant BP at home is controlled   Hyperlipidemia Lab Results  Component Value Date   LDLCALC 60 07/27/2020   Stable, pt to continue low chol  DM diet, declines statin   Vitamin D deficiency Last vitamin D Lab Results  Component Value Date   VD25OH 35.35 07/27/2020   Stable, cont oral replacement  Followup: Return in about 6 months (around 02/14/2021).  Cathlean Cower, MD 08/20/2020 6:10 AM [facility]

## 2020-08-20 ENCOUNTER — Encounter: Payer: Self-pay | Admitting: Internal Medicine

## 2020-08-20 DIAGNOSIS — E538 Deficiency of other specified B group vitamins: Secondary | ICD-10-CM | POA: Insufficient documentation

## 2020-08-20 NOTE — Assessment & Plan Note (Addendum)
BP Readings from Last 3 Encounters:  08/17/20 (!) 160/80  01/26/20 138/72  01/20/20 140/70   Stable, pt to continue medical treatment  - hct, tenormin, as is adamant BP at home is controlled

## 2020-08-20 NOTE — Assessment & Plan Note (Signed)
Lab Results  Component Value Date   LDLCALC 60 07/27/2020   Stable, pt to continue low chol DM diet, declines statin

## 2020-08-20 NOTE — Assessment & Plan Note (Signed)

## 2020-08-20 NOTE — Assessment & Plan Note (Signed)
Overcontrolled, ok to reduce po supplement to twice per wk

## 2020-08-20 NOTE — Assessment & Plan Note (Signed)
Lab Results  Component Value Date   CREATININE 1.01 07/27/2020   Stable overall, cont to avoid nephrotoxins

## 2020-08-20 NOTE — Assessment & Plan Note (Signed)
Last vitamin D Lab Results  Component Value Date   VD25OH 35.35 07/27/2020   Stable, cont oral replacement

## 2020-08-20 NOTE — Assessment & Plan Note (Signed)
Ongoing significant, likely contributes to fall risk, for referral ortho Dr Doran Durand

## 2020-08-20 NOTE — Assessment & Plan Note (Addendum)
Lab Results  Component Value Date   HGBA1C 9.2 (H) 07/27/2020   Stable, pt to continue current medical treatment including the metformin, actos and to restart the glimeparide 4 bid  Current Outpatient Medications (Endocrine & Metabolic):  .  glimepiride (AMARYL) 4 MG tablet, 1 tab by mouth twice per day .  levothyroxine (SYNTHROID) 125 MCG tablet, Take 1 tablet (125 mcg total) by mouth daily before breakfast. .  metFORMIN (GLUCOPHAGE) 1000 MG tablet, TAKE 1 TABLET BY MOUTH TWICE A DAY WITH A MEAL .  pioglitazone (ACTOS) 30 MG tablet, Take 1 tablet (30 mg total) by mouth daily. Marland Kitchen  levothyroxine (SYNTHROID) 125 MCG tablet, Take by mouth. .  levothyroxine (SYNTHROID) 175 MCG tablet, Take 175 mcg by mouth every morning.  Current Outpatient Medications (Cardiovascular):  .  atenolol (TENORMIN) 50 MG tablet, Take 1 tablet (50 mg total) by mouth daily. .  hydrochlorothiazide (HYDRODIURIL) 25 MG tablet, Take 1 tablet (25 mg total) by mouth daily.  Current Outpatient Medications (Respiratory):  .  fluticasone (FLONASE) 50 MCG/ACT nasal spray, Place 2 sprays into both nostrils daily.  Current Outpatient Medications (Analgesics):  .  aspirin 81 MG EC tablet, Take 81 mg by mouth daily. .  meloxicam (MOBIC) 7.5 MG tablet, TAKE 1 TABLET BY MOUTH EVERY DAY AS NEEDED FOR PAIN .  traMADol (ULTRAM) 50 MG tablet, TAKE 1 TABLET BY MOUTH EVERY 8 HOURS AS NEEDED   Current Outpatient Medications (Other):  .  blood glucose meter kit and supplies KIT, Dispense with patient  Insurance; Use up to four times daily as directed. E11.9 .  Blood Glucose Monitoring Suppl (ONE TOUCH ULTRA 2) w/Device KIT, Use as directed up to 4 times per day E11.9 .  Co-Enzyme Q-10 100 MG CAPS, Take 100 mg by mouth daily. Marland Kitchen  glucose blood (ONE TOUCH ULTRA TEST) test strip, Use as instructed up to 4 times per day  E11.9 .  Lancets MISC, Use as directed up to four times per day E11.9 .  meclizine (ANTIVERT) 12.5 MG tablet, Take 1  tablet (12.5 mg total) by mouth 3 (three) times daily as needed for dizziness. Marland Kitchen  omeprazole (PRILOSEC) 40 MG capsule, TAKE 1 CAPSULE BY MOUTH TWICE A DAY .  Red Yeast Rice Extract 600 MG CAPS, Take 1,200 mg by mouth 2 (two) times daily. .  Calcium Carb-Cholecalciferol (CALCIUM 600+D3) 600-800 MG-UNIT TABS, Take 1 tablet by mouth every evening.

## 2020-08-25 ENCOUNTER — Other Ambulatory Visit: Payer: Self-pay | Admitting: Internal Medicine

## 2020-08-25 DIAGNOSIS — Z1231 Encounter for screening mammogram for malignant neoplasm of breast: Secondary | ICD-10-CM

## 2020-08-29 ENCOUNTER — Ambulatory Visit (HOSPITAL_COMMUNITY)
Admission: RE | Admit: 2020-08-29 | Discharge: 2020-08-29 | Disposition: A | Discharge status: Home / Self Care | Payer: Medicare HMO | Source: Ambulatory Visit | Attending: Gastroenterology | Admitting: Gastroenterology

## 2020-08-29 ENCOUNTER — Other Ambulatory Visit: Payer: Self-pay

## 2020-08-29 DIAGNOSIS — K7460 Unspecified cirrhosis of liver: Secondary | ICD-10-CM | POA: Insufficient documentation

## 2020-08-29 DIAGNOSIS — K8020 Calculus of gallbladder without cholecystitis without obstruction: Secondary | ICD-10-CM | POA: Diagnosis not present

## 2020-09-06 DIAGNOSIS — M19071 Primary osteoarthritis, right ankle and foot: Secondary | ICD-10-CM | POA: Diagnosis not present

## 2020-09-11 ENCOUNTER — Other Ambulatory Visit: Payer: Self-pay | Admitting: Internal Medicine

## 2020-09-11 NOTE — Telephone Encounter (Signed)
Please refill as per office routine med refill policy (all routine meds refilled for 3 mo or monthly per pt preference up to one year from last visit, then month to month grace period for 3 mo, then further med refills will have to be denied)  

## 2020-09-15 ENCOUNTER — Telehealth: Payer: Self-pay | Admitting: Internal Medicine

## 2020-09-15 ENCOUNTER — Other Ambulatory Visit (INDEPENDENT_AMBULATORY_CARE_PROVIDER_SITE_OTHER): Payer: Medicare HMO

## 2020-09-15 DIAGNOSIS — K7460 Unspecified cirrhosis of liver: Secondary | ICD-10-CM | POA: Diagnosis not present

## 2020-09-15 LAB — COMPREHENSIVE METABOLIC PANEL
ALT: 19 U/L (ref 0–35)
AST: 30 U/L (ref 0–37)
Albumin: 3.8 g/dL (ref 3.5–5.2)
Alkaline Phosphatase: 100 U/L (ref 39–117)
BUN: 30 mg/dL — ABNORMAL HIGH (ref 6–23)
CO2: 30 mEq/L (ref 19–32)
Calcium: 10.1 mg/dL (ref 8.4–10.5)
Chloride: 102 mEq/L (ref 96–112)
Creatinine, Ser: 1.06 mg/dL (ref 0.40–1.20)
GFR: 50.54 mL/min — ABNORMAL LOW (ref >60.00)
Glucose, Bld: 118 mg/dL — ABNORMAL HIGH (ref 70–99)
Potassium: 4.1 mEq/L (ref 3.5–5.1)
Sodium: 140 mEq/L (ref 135–145)
Total Bilirubin: 0.8 mg/dL (ref 0.2–1.2)
Total Protein: 6.5 g/dL (ref 6.0–8.3)

## 2020-09-15 LAB — CBC WITH DIFFERENTIAL/PLATELET
Basophils Absolute: 0 10*3/uL (ref 0.0–0.1)
Basophils Relative: 0.8 % (ref 0.0–3.0)
Eosinophils Absolute: 0.3 10*3/uL (ref 0.0–0.7)
Eosinophils Relative: 6 % — ABNORMAL HIGH (ref 0.0–5.0)
HCT: 34.4 % — ABNORMAL LOW (ref 36.0–46.0)
Hemoglobin: 11.5 g/dL — ABNORMAL LOW (ref 12.0–15.0)
Lymphocytes Relative: 18.3 % (ref 12.0–46.0)
Lymphs Abs: 0.8 10*3/uL (ref 0.7–4.0)
MCHC: 33.6 g/dL (ref 30.0–36.0)
MCV: 93.2 fl (ref 78.0–100.0)
Monocytes Absolute: 0.4 10*3/uL (ref 0.1–1.0)
Monocytes Relative: 9 % (ref 3.0–12.0)
Neutro Abs: 2.9 10*3/uL (ref 1.4–7.7)
Neutrophils Relative %: 65.9 % (ref 43.0–77.0)
Platelets: 170 10*3/uL (ref 150.0–400.0)
RBC: 3.69 Mil/uL — ABNORMAL LOW (ref 3.87–5.11)
RDW: 15.1 % (ref 11.5–15.5)
WBC: 4.4 10*3/uL (ref 4.0–10.5)

## 2020-09-15 LAB — PROTIME-INR
INR: 1 ratio (ref 0.8–1.0)
Prothrombin Time: 11.4 s (ref 9.6–13.1)

## 2020-09-15 NOTE — Telephone Encounter (Signed)
Unfortunately its the allergy time of year again  Ok to take [place], nasacort otc, and mucinex bid otc as well, but take all every day for now as the allergy season is starting in

## 2020-09-15 NOTE — Telephone Encounter (Signed)
Patient calling for advice/medication for sinus drainage and cough. She states it started about 1 week ago , getting a little but drainage lingering on   Please advise

## 2020-09-15 NOTE — Telephone Encounter (Signed)
Last note given to patient

## 2020-09-18 DIAGNOSIS — M25571 Pain in right ankle and joints of right foot: Secondary | ICD-10-CM | POA: Diagnosis not present

## 2020-09-18 LAB — AFP TUMOR MARKER: AFP-Tumor Marker: 2.1 ng/mL

## 2020-09-20 ENCOUNTER — Ambulatory Visit: Payer: Medicare HMO | Admitting: Gastroenterology

## 2020-09-20 ENCOUNTER — Encounter: Payer: Self-pay | Admitting: Gastroenterology

## 2020-09-20 DIAGNOSIS — R194 Change in bowel habit: Secondary | ICD-10-CM | POA: Diagnosis not present

## 2020-09-20 DIAGNOSIS — K7460 Unspecified cirrhosis of liver: Secondary | ICD-10-CM

## 2020-09-20 NOTE — Progress Notes (Signed)
Review of pertinent gastrointestinal problems: 1.Cirrhosis:(Very possibly "burned out" autoimmune hepatitis)CT abdomen and pelvis done on 03/12/2017 showed a nodular liver consistent with cirrhosis, there is a 9 mm lesion at the right hepatic dome too small to characterize, no biliary dilation, she did have a thick-walled gallbladder with multiple gallstones, no splenomegaly but noted splenic varices.  Laboratory workup for cirrhosis September 2018:ANA was +1:160 titer;anti-smooth muscle antibody +, hepatitis A total antibody negative, hepatitis B surface antigen negative, hepatitis B surface antibody negative, hepatitis C antibody negative, ceruloplasmin normal, iron studies normal, alpha-1 antitrypsin level normal, AMAnegative  Liver lesion on MRI 03/2017;peripherally enhancing T2 hyperintense and T1 hypointense lesion within segment 8 of the liver, measuring 1.8 cm, and indeterminant.(IR was unable biopsy this);repeat MRI March 2019 showed that the lesion in question had completely disappeared, new issue was raised with "vascular abnormality" and radiology recommended follow-up MRI 3-6 months. MRI 04/2018: Cirrhosis with "stable appearance of vascular shunt in the anterior right lobe. No evidence of hepatic neoplasm."  [place] January 2021 "probable cirrhosis without focal mass".  Cholelithiasis.  Ultrasound February 2022 "cirrhotic liver morphology.  No focal hepatic abnormality identified.  Cholelithiasis".  AFP 09/2020 2.1 (normal)  MELD-Na 7 (09/2020 labs)  EGD9/2018;no signs of portal hypertension  Liver biopsy 04/2017:Cirrhosis with mild cholestasis. Pathology felt possibly this was possiblydue to an autoimmune process, 2. Colonoscopy 03/2017 (for slightly elevated CEA and liver lesion on imaging): normal TI, normal colon 3.Abdominal pain, elevated liver tests, September 2018. Gallstones in her gallbladder on imaging however pain not completely consistent with biliary process.  EGD showed with deep duodenal bulb ulcer. Biopsies of her stomach showed no H. Pylori. Seems likely the ulcer was from unopposed meloxicam. Her pains improved quickly with proton pump inhibitor, her liver tests normalized as well. 4.Pancreatic cystic lesion, body of pancreas.MRI 04/2018 "stable 8 mm cystic lesion in the pancreatic body" no concerning morphologic features such as dilated main pancreatic duct or enhancing lesions.    HPI: This is a very pleasant 80 year old woman  I last saw her about a year ago here in the office.  She is diligent about her hepatoma screening imaging test as well as cirrhosis follow-up labs.  She has had no overt GI bleeding, no significant trouble with edema, no obvious encephalopathic events.  She has noticed some alternating bowel habits with difficult to move her bowels and then looser stools on occasion.  Her weight is up 6 pounds since her last office visit here 1 year ago, same scale  ROS: complete GI ROS as described in HPI, all other review negative.  Constitutional:  No unintentional weight loss   Past Medical History:  Diagnosis Date  . ALLERGIC RHINITIS 04/29/2007  . Allergy   . Basal cell carcinoma 03/24/2018   sclerosis- upper bridge of nose ([facility])  . BCC (basal cell carcinoma) 10/18/2019   right supra orbital region- Cx3 30f  . Cataract    both eyes  . DIABETES MELLITUS, TYPE II 06/23/2007   type 2  . Elevated liver enzymes 03/2017  . Elevated tumor markers   . GERD (gastroesophageal reflux disease)   . HIATAL HERNIA, HX OF 04/29/2007  . HYPERLIPIDEMIA 04/29/2007   diet controlled on CoQ10  . HYPERTENSION 04/29/2007  . HYPOKALEMIA 05/02/2009  . HYPOTHYROIDISM 04/29/2007  . LOW BACK PAIN 04/29/2007  . [place]    diabetic neuropathy in both legs  . OSTEOARTHRITIS, KNEE, LEFT 06/23/2007  . OSTEOPOROSIS NOS 05/12/2007  . SPINAL STENOSIS, LUMBAR 04/29/2007  Past Surgical History:  Procedure  Laterality Date  . BACK SURGERY N/A 2014 x 2   T1 TO S 1   . CATARACT EXTRACTION W/ INTRAOCULAR LENS  IMPLANT, BILATERAL Bilateral 05/2011   doing well  . COLONOSCOPY WITH PROPOFOL N/A 04/10/2017   Procedure: COLONOSCOPY WITH PROPOFOL;  Surgeon: [name], MD;  Location: [facility];  Service: Endoscopy;  Laterality: N/A;  . COLONSCOPY  2011  . TOTAL KNEE ARTHROPLASTY  2006   right  . TOTAL KNEE ARTHROPLASTY Left 2012    Current Outpatient Medications  Medication Sig Dispense Refill  . aspirin 81 MG EC tablet Take 81 mg by mouth daily.    Marland Kitchen atenolol (TENORMIN) 50 MG tablet Take 1 tablet (50 mg total) by mouth daily. 90 tablet 3  . blood glucose meter kit and supplies KIT Dispense with patient  Insurance; Use up to four times daily as directed. E11.9 1 each 0  . Blood Glucose Monitoring Suppl (ONE TOUCH ULTRA 2) w/Device KIT Use as directed up to 4 times per day E11.9 1 each 0  . Cholecalciferol (VITAMIN D3) 50 MCG (2000 UT) TABS Take 1 tablet by mouth daily.    Marland Kitchen Co-Enzyme Q-10 100 MG CAPS Take 100 mg by mouth daily.    . cyanocobalamin 1000 MCG tablet Take 1,000 mcg by mouth every other day.    . fluticasone (FLONASE) 50 MCG/ACT nasal spray Place 2 sprays into both nostrils daily. 48 g 3  . glimepiride (AMARYL) 4 MG tablet 1 tab by mouth twice per day 180 tablet 3  . glucose blood (ONE TOUCH ULTRA TEST) test strip Use as instructed up to 4 times per day  E11.9 100 each 12  . hydrochlorothiazide (HYDRODIURIL) 25 MG tablet Take 1 tablet (25 mg total) by mouth daily. 90 tablet 3  . Lancets MISC Use as directed up to four times per day E11.9 200 each 11  . levothyroxine (SYNTHROID) 125 MCG tablet Take 1 tablet (125 mcg total) by mouth daily before breakfast. 90 tablet 3  . Magnesium 250 MG TABS Take 1 tablet by mouth daily.    . meclizine (ANTIVERT) 12.5 MG tablet Take 1 tablet (12.5 mg total) by mouth 3 (three) times daily as needed for dizziness. 90 tablet 1  . meloxicam (MOBIC)  7.5 MG tablet TAKE 1 TABLET BY MOUTH EVERY DAY AS NEEDED FOR PAIN 90 tablet 3  . metFORMIN (GLUCOPHAGE) 1000 MG tablet TAKE 1 TABLET BY MOUTH TWICE A DAY WITH A MEAL 180 tablet 3  . omeprazole (PRILOSEC) 40 MG capsule TAKE 1 CAPSULE BY MOUTH TWICE A DAY (Patient taking differently: Take 40 mg by mouth daily.) 180 capsule 3  . pioglitazone (ACTOS) 30 MG tablet Take 1 tablet (30 mg total) by mouth daily. 90 tablet 3  . Red Yeast Rice Extract 600 MG CAPS Take 1,200 mg by mouth 2 (two) times daily.    . traMADol (ULTRAM) 50 MG tablet TAKE 1 TABLET BY MOUTH EVERY 8 HOURS AS NEEDED 90 tablet 4   No current facility-administered medications for this visit.    Allergies as of 09/20/2020  . (No Known Allergies)    Family History  Problem Relation Age of Onset  . Coronary artery disease Other        Female 1st degree relative  . Diabetes Other        1st degree relative  . Colon polyps Other   . Arthritis Mother   . Hypertension Mother   .  Coronary artery disease Father   . Arthritis Brother   . Stroke Brother   . Hyperlipidemia Brother   . COPD Brother   . Colon cancer Neg Hx   . Esophageal cancer Neg Hx   . Rectal cancer Neg Hx   . Stomach cancer Neg Hx     Social History   Socioeconomic History  . Marital status: Divorced    Spouse name: Not on file  . Number of children: 1  . Years of education: Not on file  . Highest education level: Not on file  Occupational History  . 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 Smoking status: Never Smoker  . Smokeless tobacco: Never Used  Vaping Use  . Vaping Use: Never used  Substance and Sexual Activity  . Alcohol use: Yes    Alcohol/week: 0.0 standard drinks    Comment: One or two drinks in month  . Drug use: No  . Sexual activity: Never  Other Topics Concern  . Not on file  Social History Narrative  . Not on file   Social Determinants of Health   Financial Resource Strain: Low Risk   . Difficulty of  Paying Living Expenses: Not hard at all  Food Insecurity: No Food Insecurity  . Worried About Charity fundraiser in the Last Year: Never true  . Ran Out of Food in the Last Year: Never true  Transportation Needs: No Transportation Needs  . Lack of Transportation (Medical): No  . Lack of Transportation (Non-Medical): No  Physical Activity: Inactive  . Days of Exercise per Week: 0 days  . Minutes of Exercise per Session: 0 min  Stress: No Stress Concern Present  . Feeling of Stress : Not at all  Social Connections: Unknown  . Frequency of Communication with Friends and Family: More than three times a week  . Frequency of Social Gatherings with Friends and Family: More than three times a week  . Attends Religious Services: More than 4 times per year  . Active Member of Clubs or Organizations: No  . Attends Archivist Meetings: Never  . Marital Status: Not on file  Intimate Partner Violence: Not At Risk  . Fear of Current or Ex-Partner: No  . Emotionally Abused: No  . Physically Abused: No  . Sexually Abused: No     Physical Exam: BP (!) 160/90 (BP Location: Left Arm, Patient Position: Sitting, Cuff Size: Normal)   Pulse 76   Ht '5\' 4"'  (1.626 m)   Wt 238 lb 2 oz (108 kg)   BMI 40.87 kg/m  Constitutional: generally well-appearing Psychiatric: alert and oriented x3 Abdomen: soft, nontender, nondistended, no obvious ascites, no peritoneal signs, normal bowel sounds Trace lower extremity pitting edema noted in lower extremities  Assessment and plan: 80 y.o. female with well compensated cirrhosis, unclear etiology, felt likely burnt out autoimmune hepatitis  She really is doing well.  She is very diligent about her hepatoma screening and her periodic blood work.  She does have trace lower extremity edema and knows to keep an eye on that if it gets worse she will call.  She already watches her salt intake quite well.  She has mild alternating bowel habits and I recommended a  trial of fiber supplement with Citrucel.  She needs an ultrasound of her liver for hepatoma screening in 6 months and office visit with me again 12 months with staging labs shortly prior to that.  Please  see the "Patient Instructions" section for addition details about the plan.  Owens Loffler, MD [facility] 09/20/2020, 8:30 AM   Total time on date of encounter was 30 minutes (this included time spent preparing to see the patient reviewing records; obtaining and/or reviewing separately obtained history; performing a medically appropriate exam and/or evaluation; counseling and educating the patient and family if present; ordering medications, tests or procedures if applicable; and documenting clinical information in the health record).

## 2020-09-20 NOTE — Patient Instructions (Signed)
If you are age 78 or older, your body mass index should be between 23-30. Your Body mass index is 40.87 kg/m. If this is out of the aforementioned range listed, please consider follow up with your Primary Care Provider.  Please start taking citrucel (orange flavored) powder fiber supplement.  This may cause some bloating at first but that usually goes away. Begin with a small spoonful and work your way up to a large, heaping spoonful daily over a week.  You will have a right upper quadrant abdominal ultrasound attn: liver in 6 months (September 2022).  We will contact your to schedule the appointment.  You will follow up in our office in 1 year (March 2023).  We will contact you to schedule the appointment.   Thank you for entrusting me with your care and choosing [facility].  Dr Ardis Hughs

## 2020-09-27 ENCOUNTER — Other Ambulatory Visit: Payer: Self-pay | Admitting: Internal Medicine

## 2020-09-27 NOTE — Telephone Encounter (Signed)
Please refill as per office routine med refill policy (all routine meds refilled for 3 mo or monthly per pt preference up to one year from last visit, then month to month grace period for 3 mo, then further med refills will have to be denied)  

## 2020-10-16 ENCOUNTER — Ambulatory Visit
Admission: RE | Admit: 2020-10-16 | Discharge: 2020-10-16 | Disposition: A | Discharge status: Home / Self Care | Payer: Medicare HMO | Source: Ambulatory Visit | Attending: Internal Medicine | Admitting: Internal Medicine

## 2020-10-16 ENCOUNTER — Other Ambulatory Visit: Payer: Self-pay

## 2020-10-16 DIAGNOSIS — Z1231 Encounter for screening mammogram for malignant neoplasm of breast: Secondary | ICD-10-CM

## 2020-10-17 DIAGNOSIS — M79606 Pain in leg, unspecified: Secondary | ICD-10-CM | POA: Diagnosis not present

## 2020-10-17 DIAGNOSIS — G5703 Lesion of sciatic nerve, bilateral lower limbs: Secondary | ICD-10-CM | POA: Diagnosis not present

## 2020-11-09 ENCOUNTER — Encounter: Payer: Self-pay | Admitting: Internal Medicine

## 2020-11-09 ENCOUNTER — Other Ambulatory Visit: Payer: Self-pay

## 2020-11-09 ENCOUNTER — Ambulatory Visit (INDEPENDENT_AMBULATORY_CARE_PROVIDER_SITE_OTHER): Payer: Medicare HMO | Admitting: Internal Medicine

## 2020-11-09 VITALS — BP 152/78 | HR 65 | Ht 64.0 in | Wt 227.0 lb

## 2020-11-09 DIAGNOSIS — J4531 Mild persistent asthma with (acute) exacerbation: Secondary | ICD-10-CM | POA: Diagnosis not present

## 2020-11-09 DIAGNOSIS — E1165 Type 2 diabetes mellitus with hyperglycemia: Secondary | ICD-10-CM | POA: Diagnosis not present

## 2020-11-09 DIAGNOSIS — I10 Essential (primary) hypertension: Secondary | ICD-10-CM

## 2020-11-09 DIAGNOSIS — J309 Allergic rhinitis, unspecified: Secondary | ICD-10-CM | POA: Diagnosis not present

## 2020-11-09 MED ORDER — METHYLPREDNISOLONE ACETATE 80 MG/ML IJ SUSP
80.0000 mg | Freq: Once | INTRAMUSCULAR | Status: AC
  Administered 2020-11-09: 80 mg via INTRAMUSCULAR

## 2020-11-09 MED ORDER — PREDNISONE 10 MG PO TABS: ORAL_TABLET | ORAL | 0 refills | Status: DC

## 2020-11-09 MED ORDER — HYDROCODONE BIT-HOMATROP MBR 5-1.5 MG/5ML PO SOLN: 5.0000 mL | Freq: Four times a day (QID) | ORAL | 0 refills | Status: AC | PRN

## 2020-11-09 NOTE — Progress Notes (Signed)
Patient ID: Kelsey Keller, female   DOB: [birth year], 80 y.o.   MRN: [id number]        Chief Complaint: allergies and asthma worsening x 2 wks, dm,htn       HPI:  Kelsey Keller is a 80 y.o. female here with 2 wks gradually worsening head congestion without pain or fever, then several days of scant prod cough, wheezing, sob and doe.  Pt denies chest pain, orthopnea, PND, increased LE swelling, palpitations, dizziness or syncope.   Pt denies polydipsia, polyuria, or new focal neuro s/s.   Pt denies fever, wt loss, night sweats, loss of appetite, or other constitutional symptoms  No other new complaints       Wt Readings from Last 3 Encounters:  11/09/20 227 lb (103 kg)  09/20/20 238 lb 2 oz (108 kg)  08/17/20 221 lb (100.2 kg)   BP Readings from Last 3 Encounters:  11/09/20 (!) 152/78  09/20/20 (!) 160/90  08/17/20 (!) 160/80         Past Medical History:  Diagnosis Date  . ALLERGIC RHINITIS 04/29/2007  . Allergy   . Basal cell carcinoma 03/24/2018   sclerosis- upper bridge of nose ([facility])  . BCC (basal cell carcinoma) 10/18/2019   right supra orbital region- Cx3 28f  . Cataract    both eyes  . DIABETES MELLITUS, TYPE II 06/23/2007   type 2  . Elevated liver enzymes 03/2017  . Elevated tumor markers   . GERD (gastroesophageal reflux disease)   . HIATAL HERNIA, HX OF 04/29/2007  . HYPERLIPIDEMIA 04/29/2007   diet controlled on CoQ10  . HYPERTENSION 04/29/2007  . HYPOKALEMIA 05/02/2009  . HYPOTHYROIDISM 04/29/2007  . LOW BACK PAIN 04/29/2007  . [place]romuscular disorder [place]    diabetic neuropathy in both legs  . OSTEOARTHRITIS, KNEE, LEFT 06/23/2007  . OSTEOPOROSIS NOS 05/12/2007  . SPINAL STENOSIS, LUMBAR 04/29/2007   Past Surgical History:  Procedure Laterality Date  . BACK SURGERY N/A 2014 x 2   T1 TO S 1   . CATARACT EXTRACTION W/ INTRAOCULAR LENS  IMPLANT, BILATERAL Bilateral 05/2011   doing well  . COLONOSCOPY WITH PROPOFOL N/A 04/10/2017   Procedure: COLONOSCOPY  WITH PROPOFOL;  Surgeon: JMilus Banister MD;  Location: [facility];  Service: Endoscopy;  Laterality: N/A;  . COLONSCOPY  2011  . TOTAL KNEE ARTHROPLASTY  2006   right  . TOTAL KNEE ARTHROPLASTY Left 2012    reports that she has never smoked. She has never used smokeless tobacco. She reports current alcohol use. She reports that she does not use drugs. family history includes Arthritis in her brother and mother; COPD in her brother; Colon polyps in an other family member; Coronary artery disease in her father and another family member; Diabetes in an other family member; Hyperlipidemia in her brother; Hypertension in her mother; Stroke in her brother. No Known Allergies Current Outpatient Medications on File Prior to Visit  Medication Sig Dispense Refill  . aspirin 81 MG EC tablet Take 81 mg by mouth daily.    .Marland Kitchenatenolol (TENORMIN) 50 MG tablet TAKE 1 TABLET BY MOUTH EVERY DAY 90 tablet 3  . blood glucose meter kit and supplies KIT Dispense with patient  Insurance; Use up to four times daily as directed. E11.9 1 each 0  . Blood Glucose Monitoring Suppl (ONE TOUCH ULTRA 2) w/Device KIT Use as directed up to 4 times per day E11.9 1 each 0  . Cholecalciferol (VITAMIN D3) 50 MCG (2000  UT) TABS Take 1 tablet by mouth daily.    Marland Kitchen Co-Enzyme Q-10 100 MG CAPS Take 100 mg by mouth daily.    . cyanocobalamin 1000 MCG tablet Take 1,000 mcg by mouth every other day.    . fluticasone (FLONASE) 50 MCG/ACT nasal spray Place 2 sprays into both nostrils daily. 48 g 3  . glimepiride (AMARYL) 4 MG tablet 1 tab by mouth twice per day 180 tablet 3  . glucose blood (ONE TOUCH ULTRA TEST) test strip Use as instructed up to 4 times per day  E11.9 100 each 12  . hydrochlorothiazide (HYDRODIURIL) 25 MG tablet Take 1 tablet (25 mg total) by mouth daily. 90 tablet 3  . Lancets MISC Use as directed up to four times per day E11.9 200 each 11  . levothyroxine (SYNTHROID) 125 MCG tablet TAKE 1 TABLET BY MOUTH EVERY DAY  BEFORE BREAKFAST 90 tablet 3  . Magnesium 250 MG TABS Take 1 tablet by mouth daily.    . meclizine (ANTIVERT) 12.5 MG tablet Take 1 tablet (12.5 mg total) by mouth 3 (three) times daily as needed for dizziness. 90 tablet 1  . meloxicam (MOBIC) 7.5 MG tablet TAKE 1 TABLET BY MOUTH EVERY DAY AS NEEDED FOR PAIN 90 tablet 3  . metFORMIN (GLUCOPHAGE) 1000 MG tablet TAKE 1 TABLET BY MOUTH TWICE A DAY WITH A MEAL 180 tablet 3  . omeprazole (PRILOSEC) 40 MG capsule TAKE 1 CAPSULE BY MOUTH TWICE A DAY (Patient taking differently: Take 40 mg by mouth daily.) 180 capsule 3  . pioglitazone (ACTOS) 30 MG tablet Take 1 tablet (30 mg total) by mouth daily. 90 tablet 3  . Red Yeast Rice Extract 600 MG CAPS Take 1,200 mg by mouth 2 (two) times daily.    . traMADol (ULTRAM) 50 MG tablet TAKE 1 TABLET BY MOUTH EVERY 8 HOURS AS NEEDED 90 tablet 4   No current facility-administered medications on file prior to visit.        ROS:  All others reviewed and negative.  Objective        PE:  BP (!) 152/78 (BP Location: Left Arm, Patient Position: Sitting, Cuff Size: Large)   Pulse 65   Ht '5\' 4"'  (1.626 m)   Wt 227 lb (103 kg)   SpO2 97%   BMI 38.96 kg/m                 Constitutional: Pt appears in NAD               HENT: Head: NCAT.                Right Ear: External ear normal.                 Left Ear: External ear normal.                Eyes: . Pupils are equal, round, and reactive to light. Conjunctivae and EOM are normal               Nose: without d/c or deformity               Neck: Neck supple. Gross normal ROM               Cardiovascular: Normal rate and regular rhythm.                 Pulmonary/Chest: Effort normal and breath sounds decreased without rales but mild wheezing.  Abd:  Soft, NT, ND, + BS, no organomegaly               Neurological: Pt is alert. At baseline orientation, motor grossly intact               Skin: Skin is warm. No rashes, no other new lesions, LE edema -  none               Psychiatric: Pt behavior is normal without agitation   Micro: none  Cardiac tracings I have personally interpreted today:  none  Pertinent Radiological findings (summarize): none   Lab Results  Component Value Date   WBC 4.4 09/15/2020   HGB 11.5 (L) 09/15/2020   HCT 34.4 (L) 09/15/2020   PLT 170.0 09/15/2020   GLUCOSE 118 (H) 09/15/2020   CHOL 154 07/27/2020   TRIG 98.0 07/27/2020   HDL 74.40 07/27/2020   LDLDIRECT 107.5 07/21/2013   LDLCALC 60 07/27/2020   ALT 19 09/15/2020   AST 30 09/15/2020   NA 140 09/15/2020   K 4.1 09/15/2020   CL 102 09/15/2020   CREATININE 1.06 09/15/2020   BUN 30 (H) 09/15/2020   CO2 30 09/15/2020   TSH 4.80 (H) 07/27/2020   INR 1.0 09/15/2020   HGBA1C 9.2 (H) 07/27/2020   MICROALBUR 17.8 (H) 07/27/2020   Assessment/Plan:  CHARMIAN FORBIS is a 80 y.o. White or Caucasian [1] female with  has a past medical history of ALLERGIC RHINITIS (04/29/2007), Allergy, Basal cell carcinoma (03/24/2018), BCC (basal cell carcinoma) (10/18/2019), Cataract, DIABETES MELLITUS, TYPE II (06/23/2007), Elevated liver enzymes (03/2017), Elevated tumor markers, GERD (gastroesophageal reflux disease), HIATAL HERNIA, HX OF (04/29/2007), HYPERLIPIDEMIA (04/29/2007), HYPERTENSION (04/29/2007), HYPOKALEMIA (05/02/2009), HYPOTHYROIDISM (04/29/2007), LOW BACK PAIN (04/29/2007), Neuromuscular disorder ([place]), OSTEOARTHRITIS, KNEE, LEFT (06/23/2007), OSTEOPOROSIS NOS (05/12/2007), and SPINAL STENOSIS, LUMBAR (04/29/2007).  Allergic rhinitis Mild to mod, for allegra, nasacort and predpac asd today,  to f/u any worsening symptoms or concerns  Asthma exacerbation Mild, for predpac asd, depomedrol IM 80,  to f/u any worsening symptoms or concerns  Diabetes  Stable by hx, for a1c with labs, pt to continue current medical treatment glimeparide, metformin, actos  Current Outpatient Medications (Endocrine & Metabolic):  .  glimepiride (AMARYL) 4 MG tablet, 1 tab by  mouth twice per day .  levothyroxine (SYNTHROID) 125 MCG tablet, TAKE 1 TABLET BY MOUTH EVERY DAY BEFORE BREAKFAST .  metFORMIN (GLUCOPHAGE) 1000 MG tablet, TAKE 1 TABLET BY MOUTH TWICE A DAY WITH A MEAL .  pioglitazone (ACTOS) 30 MG tablet, Take 1 tablet (30 mg total) by mouth daily. .  predniSONE (DELTASONE) 10 MG tablet, 3 tabs by mouth per day for 3 days,2tabs per day for 3 days,1tab per day for 3 days  Current Outpatient Medications (Cardiovascular):  .  atenolol (TENORMIN) 50 MG tablet, TAKE 1 TABLET BY MOUTH EVERY DAY .  hydrochlorothiazide (HYDRODIURIL) 25 MG tablet, Take 1 tablet (25 mg total) by mouth daily.  Current Outpatient Medications (Respiratory):  .  fluticasone (FLONASE) 50 MCG/ACT nasal spray, Place 2 sprays into both nostrils daily. Marland Kitchen  HYDROcodone bit-homatropine (HYCODAN) 5-1.5 MG/5ML syrup, Take 5 mLs by mouth every 6 (six) hours as needed for up to 10 days for cough.  Current Outpatient Medications (Analgesics):  .  aspirin 81 MG EC tablet, Take 81 mg by mouth daily. .  meloxicam (MOBIC) 7.5 MG tablet, TAKE 1 TABLET BY MOUTH EVERY DAY AS NEEDED FOR PAIN .  traMADol (ULTRAM) 50 MG tablet, TAKE 1  TABLET BY MOUTH EVERY 8 HOURS AS NEEDED  Current Outpatient Medications (Hematological):  .  cyanocobalamin 1000 MCG tablet, Take 1,000 mcg by mouth every other day.  Current Outpatient Medications (Other):  .  blood glucose meter kit and supplies KIT, Dispense with patient  Insurance; Use up to four times daily as directed. E11.9 .  Blood Glucose Monitoring Suppl (ONE TOUCH ULTRA 2) w/Device KIT, Use as directed up to 4 times per day E11.9 .  Cholecalciferol (VITAMIN D3) 50 MCG (2000 UT) TABS, Take 1 tablet by mouth daily. Marland Kitchen  Co-Enzyme Q-10 100 MG CAPS, Take 100 mg by mouth daily. Marland Kitchen  glucose blood (ONE TOUCH ULTRA TEST) test strip, Use as instructed up to 4 times per day  E11.9 .  Lancets MISC, Use as directed up to four times per day E11.9 .  Magnesium 250 MG TABS, Take 1  tablet by mouth daily. .  meclizine (ANTIVERT) 12.5 MG tablet, Take 1 tablet (12.5 mg total) by mouth 3 (three) times daily as needed for dizziness. Marland Kitchen  omeprazole (PRILOSEC) 40 MG capsule, TAKE 1 CAPSULE BY MOUTH TWICE A DAY (Patient taking differently: Take 40 mg by mouth daily.) .  Red Yeast Rice Extract 600 MG CAPS, Take 1,200 mg by mouth 2 (two) times daily.   Essential hypertension BP Readings from Last 3 Encounters:  11/09/20 (!) 152/78  09/20/20 (!) 160/90  08/17/20 (!) 160/80   Possibly reactive but may be persistent elevated as well, pt to continue medical treatment as declines change for now, but to check at home and call for persistent BP > 140/90  Current Outpatient Medications (Endocrine & Metabolic):  .  glimepiride (AMARYL) 4 MG tablet, 1 tab by mouth twice per day .  levothyroxine (SYNTHROID) 125 MCG tablet, TAKE 1 TABLET BY MOUTH EVERY DAY BEFORE BREAKFAST .  metFORMIN (GLUCOPHAGE) 1000 MG tablet, TAKE 1 TABLET BY MOUTH TWICE A DAY WITH A MEAL .  pioglitazone (ACTOS) 30 MG tablet, Take 1 tablet (30 mg total) by mouth daily. .  predniSONE (DELTASONE) 10 MG tablet, 3 tabs by mouth per day for 3 days,2tabs per day for 3 days,1tab per day for 3 days  Current Outpatient Medications (Cardiovascular):  .  atenolol (TENORMIN) 50 MG tablet, TAKE 1 TABLET BY MOUTH EVERY DAY .  hydrochlorothiazide (HYDRODIURIL) 25 MG tablet, Take 1 tablet (25 mg total) by mouth daily.  Current Outpatient Medications (Respiratory):  .  fluticasone (FLONASE) 50 MCG/ACT nasal spray, Place 2 sprays into both nostrils daily. Marland Kitchen  HYDROcodone bit-homatropine (HYCODAN) 5-1.5 MG/5ML syrup, Take 5 mLs by mouth every 6 (six) hours as needed for up to 10 days for cough.  Current Outpatient Medications (Analgesics):  .  aspirin 81 MG EC tablet, Take 81 mg by mouth daily. .  meloxicam (MOBIC) 7.5 MG tablet, TAKE 1 TABLET BY MOUTH EVERY DAY AS NEEDED FOR PAIN .  traMADol (ULTRAM) 50 MG tablet, TAKE 1 TABLET BY  MOUTH EVERY 8 HOURS AS NEEDED  Current Outpatient Medications (Hematological):  .  cyanocobalamin 1000 MCG tablet, Take 1,000 mcg by mouth every other day.  Current Outpatient Medications (Other):  .  blood glucose meter kit and supplies KIT, Dispense with patient  Insurance; Use up to four times daily as directed. E11.9 .  Blood Glucose Monitoring Suppl (ONE TOUCH ULTRA 2) w/Device KIT, Use as directed up to 4 times per day E11.9 .  Cholecalciferol (VITAMIN D3) 50 MCG (2000 UT) TABS, Take 1 tablet by mouth daily. Marland Kitchen  Co-Enzyme Q-10 100 MG CAPS, Take 100 mg by mouth daily. Marland Kitchen  glucose blood (ONE TOUCH ULTRA TEST) test strip, Use as instructed up to 4 times per day  E11.9 .  Lancets MISC, Use as directed up to four times per day E11.9 .  Magnesium 250 MG TABS, Take 1 tablet by mouth daily. .  meclizine (ANTIVERT) 12.5 MG tablet, Take 1 tablet (12.5 mg total) by mouth 3 (three) times daily as needed for dizziness. Marland Kitchen  omeprazole (PRILOSEC) 40 MG capsule, TAKE 1 CAPSULE BY MOUTH TWICE A DAY (Patient taking differently: Take 40 mg by mouth daily.) .  Red Yeast Rice Extract 600 MG CAPS, Take 1,200 mg by mouth 2 (two) times daily.   Followup: Return in about 16 weeks (around 03/01/2021).  Cathlean Cower, MD 11/11/2020 11:56 PM [facility]

## 2020-11-09 NOTE — Patient Instructions (Signed)
You had the steroid shot today  Please take all new medication as prescribed - the prednisone, and the cough medicine as needed  Ok to continue the Allertec and Nasacort  Please continue all other medications as before, and refills have been done if requested.  Please have the pharmacy call with any other refills you may need.  Please continue your efforts at being more active, low cholesterol diet, and weight control.  Please keep your appointments with your specialists as you may have planned

## 2020-11-11 ENCOUNTER — Encounter: Payer: Self-pay | Admitting: Internal Medicine

## 2020-11-11 DIAGNOSIS — J45901 Unspecified asthma with (acute) exacerbation: Secondary | ICD-10-CM | POA: Insufficient documentation

## 2020-11-11 NOTE — Assessment & Plan Note (Signed)
Mild, for predpac asd, depomedrol IM 80,  to f/u any worsening symptoms or concerns

## 2020-11-11 NOTE — Assessment & Plan Note (Signed)
BP Readings from Last 3 Encounters:  11/09/20 (!) 152/78  09/20/20 (!) 160/90  08/17/20 (!) 160/80   Possibly reactive but may be persistent elevated as well, pt to continue medical treatment as declines change for now, but to check at home and call for persistent BP > 140/90  Current Outpatient Medications (Endocrine & Metabolic):  .  glimepiride (AMARYL) 4 MG tablet, 1 tab by mouth twice per day .  levothyroxine (SYNTHROID) 125 MCG tablet, TAKE 1 TABLET BY MOUTH EVERY DAY BEFORE BREAKFAST .  metFORMIN (GLUCOPHAGE) 1000 MG tablet, TAKE 1 TABLET BY MOUTH TWICE A DAY WITH A MEAL .  pioglitazone (ACTOS) 30 MG tablet, Take 1 tablet (30 mg total) by mouth daily. .  predniSONE (DELTASONE) 10 MG tablet, 3 tabs by mouth per day for 3 days,2tabs per day for 3 days,1tab per day for 3 days  Current Outpatient Medications (Cardiovascular):  .  atenolol (TENORMIN) 50 MG tablet, TAKE 1 TABLET BY MOUTH EVERY DAY .  hydrochlorothiazide (HYDRODIURIL) 25 MG tablet, Take 1 tablet (25 mg total) by mouth daily.  Current Outpatient Medications (Respiratory):  .  fluticasone (FLONASE) 50 MCG/ACT nasal spray, Place 2 sprays into both nostrils daily. Marland Kitchen  HYDROcodone bit-homatropine (HYCODAN) 5-1.5 MG/5ML syrup, Take 5 mLs by mouth every 6 (six) hours as needed for up to 10 days for cough.  Current Outpatient Medications (Analgesics):  .  aspirin 81 MG EC tablet, Take 81 mg by mouth daily. .  meloxicam (MOBIC) 7.5 MG tablet, TAKE 1 TABLET BY MOUTH EVERY DAY AS NEEDED FOR PAIN .  traMADol (ULTRAM) 50 MG tablet, TAKE 1 TABLET BY MOUTH EVERY 8 HOURS AS NEEDED  Current Outpatient Medications (Hematological):  .  cyanocobalamin 1000 MCG tablet, Take 1,000 mcg by mouth every other day.  Current Outpatient Medications (Other):  .  blood glucose meter kit and supplies KIT, Dispense with patient  Insurance; Use up to four times daily as directed. E11.9 .  Blood Glucose Monitoring Suppl (ONE TOUCH ULTRA 2) w/Device KIT,  Use as directed up to 4 times per day E11.9 .  Cholecalciferol (VITAMIN D3) 50 MCG (2000 UT) TABS, Take 1 tablet by mouth daily. Marland Kitchen  Co-Enzyme Q-10 100 MG CAPS, Take 100 mg by mouth daily. Marland Kitchen  glucose blood (ONE TOUCH ULTRA TEST) test strip, Use as instructed up to 4 times per day  E11.9 .  Lancets MISC, Use as directed up to four times per day E11.9 .  Magnesium 250 MG TABS, Take 1 tablet by mouth daily. .  meclizine (ANTIVERT) 12.5 MG tablet, Take 1 tablet (12.5 mg total) by mouth 3 (three) times daily as needed for dizziness. Marland Kitchen  omeprazole (PRILOSEC) 40 MG capsule, TAKE 1 CAPSULE BY MOUTH TWICE A DAY (Patient taking differently: Take 40 mg by mouth daily.) .  Red Yeast Rice Extract 600 MG CAPS, Take 1,200 mg by mouth 2 (two) times daily.

## 2020-11-11 NOTE — Assessment & Plan Note (Signed)
  Stable by hx, for a1c with labs, pt to continue current medical treatment glimeparide, metformin, actos  Current Outpatient Medications (Endocrine & Metabolic):  .  glimepiride (AMARYL) 4 MG tablet, 1 tab by mouth twice per day .  levothyroxine (SYNTHROID) 125 MCG tablet, TAKE 1 TABLET BY MOUTH EVERY DAY BEFORE BREAKFAST .  metFORMIN (GLUCOPHAGE) 1000 MG tablet, TAKE 1 TABLET BY MOUTH TWICE A DAY WITH A MEAL .  pioglitazone (ACTOS) 30 MG tablet, Take 1 tablet (30 mg total) by mouth daily. .  predniSONE (DELTASONE) 10 MG tablet, 3 tabs by mouth per day for 3 days,2tabs per day for 3 days,1tab per day for 3 days  Current Outpatient Medications (Cardiovascular):  .  atenolol (TENORMIN) 50 MG tablet, TAKE 1 TABLET BY MOUTH EVERY DAY .  hydrochlorothiazide (HYDRODIURIL) 25 MG tablet, Take 1 tablet (25 mg total) by mouth daily.  Current Outpatient Medications (Respiratory):  .  fluticasone (FLONASE) 50 MCG/ACT nasal spray, Place 2 sprays into both nostrils daily. Marland Kitchen  HYDROcodone bit-homatropine (HYCODAN) 5-1.5 MG/5ML syrup, Take 5 mLs by mouth every 6 (six) hours as needed for up to 10 days for cough.  Current Outpatient Medications (Analgesics):  .  aspirin 81 MG EC tablet, Take 81 mg by mouth daily. .  meloxicam (MOBIC) 7.5 MG tablet, TAKE 1 TABLET BY MOUTH EVERY DAY AS NEEDED FOR PAIN .  traMADol (ULTRAM) 50 MG tablet, TAKE 1 TABLET BY MOUTH EVERY 8 HOURS AS NEEDED  Current Outpatient Medications (Hematological):  .  cyanocobalamin 1000 MCG tablet, Take 1,000 mcg by mouth every other day.  Current Outpatient Medications (Other):  .  blood glucose meter kit and supplies KIT, Dispense with patient  Insurance; Use up to four times daily as directed. E11.9 .  Blood Glucose Monitoring Suppl (ONE TOUCH ULTRA 2) w/Device KIT, Use as directed up to 4 times per day E11.9 .  Cholecalciferol (VITAMIN D3) 50 MCG (2000 UT) TABS, Take 1 tablet by mouth daily. Marland Kitchen  Co-Enzyme Q-10 100 MG CAPS, Take 100 mg  by mouth daily. Marland Kitchen  glucose blood (ONE TOUCH ULTRA TEST) test strip, Use as instructed up to 4 times per day  E11.9 .  Lancets MISC, Use as directed up to four times per day E11.9 .  Magnesium 250 MG TABS, Take 1 tablet by mouth daily. .  meclizine (ANTIVERT) 12.5 MG tablet, Take 1 tablet (12.5 mg total) by mouth 3 (three) times daily as needed for dizziness. Marland Kitchen  omeprazole (PRILOSEC) 40 MG capsule, TAKE 1 CAPSULE BY MOUTH TWICE A DAY (Patient taking differently: Take 40 mg by mouth daily.) .  Red Yeast Rice Extract 600 MG CAPS, Take 1,200 mg by mouth 2 (two) times daily.

## 2020-11-11 NOTE — Assessment & Plan Note (Signed)
Mild to mod, for allegra, nasacort and predpac asd today,  to f/u any worsening symptoms or concerns

## 2020-11-29 DIAGNOSIS — E119 Type 2 diabetes mellitus without complications: Secondary | ICD-10-CM | POA: Diagnosis not present

## 2020-11-29 LAB — HM DIABETES EYE EXAM

## 2020-12-04 ENCOUNTER — Telehealth: Payer: Self-pay | Admitting: Internal Medicine

## 2020-12-04 DIAGNOSIS — R059 Cough, unspecified: Secondary | ICD-10-CM

## 2020-12-04 DIAGNOSIS — J4531 Mild persistent asthma with (acute) exacerbation: Secondary | ICD-10-CM

## 2020-12-04 NOTE — Telephone Encounter (Signed)
Patient calling to report she is still coughing since 04/28 appointment She has used mostly all of the medication prescribed. Seeking advice for continued cough

## 2020-12-04 NOTE — Telephone Encounter (Signed)
Ok to let pt know I ordered cxr that can be without any appt, at the [facility] xray dept, thanks

## 2020-12-05 ENCOUNTER — Other Ambulatory Visit: Payer: Self-pay

## 2020-12-05 ENCOUNTER — Ambulatory Visit (INDEPENDENT_AMBULATORY_CARE_PROVIDER_SITE_OTHER)
Admission: RE | Admit: 2020-12-05 | Discharge: 2020-12-05 | Disposition: A | Discharge status: Home / Self Care | Payer: Medicare HMO | Source: Ambulatory Visit | Attending: Internal Medicine | Admitting: Internal Medicine

## 2020-12-05 DIAGNOSIS — R059 Cough, unspecified: Secondary | ICD-10-CM | POA: Diagnosis not present

## 2020-12-05 DIAGNOSIS — J4531 Mild persistent asthma with (acute) exacerbation: Secondary | ICD-10-CM | POA: Diagnosis not present

## 2020-12-05 NOTE — Telephone Encounter (Signed)
   Patient made aware of order for chest xray

## 2020-12-06 ENCOUNTER — Other Ambulatory Visit: Payer: Self-pay

## 2020-12-06 ENCOUNTER — Encounter: Payer: Self-pay | Admitting: Internal Medicine

## 2021-01-16 DIAGNOSIS — G5703 Lesion of sciatic nerve, bilateral lower limbs: Secondary | ICD-10-CM | POA: Diagnosis not present

## 2021-01-29 ENCOUNTER — Ambulatory Visit (INDEPENDENT_AMBULATORY_CARE_PROVIDER_SITE_OTHER): Payer: Medicare HMO | Admitting: Otolaryngology

## 2021-01-29 ENCOUNTER — Other Ambulatory Visit: Payer: Self-pay

## 2021-01-29 DIAGNOSIS — J310 Chronic rhinitis: Secondary | ICD-10-CM

## 2021-01-29 DIAGNOSIS — K219 Gastro-esophageal reflux disease without esophagitis: Secondary | ICD-10-CM | POA: Diagnosis not present

## 2021-01-29 NOTE — Progress Notes (Signed)
HPI: RHODIA ACRES is a 80 y.o. female who presents is referred by her PCP Dr. Jenny Reichmann for evaluation of chronic cough that she has had starting back in April.  She was initially treated with prednisone as well as a hydrocodone cough suppressant and did better while on the medication but since coming off the medication she has had increasing of her coughing symptoms.  She is describing chronic cough that has been mostly nonproductive.  She has had a chest x-ray which is reported as showing no significant pulmonary disease.  She has coughed up some phlegm but has not really had much of a productive cough.  She thought it might be related to her sinuses but is not blowing out any mucopurulent mucus with the mucus that she has blown out being mostly clear.  She has been using Nasacort 2 sprays intermittently but not on a regular basis.  She has been prescribed omeprazole for reflux symptoms.  She used to take this twice a day but now takes it in the a.m.Marland Kitchen She recently went on a trip to the beach for a week and symptoms seem to get better while she was at the beach.  Past Medical History:  Diagnosis Date   ALLERGIC RHINITIS 04/29/2007   Allergy    Basal cell carcinoma 03/24/2018   sclerosis- upper bridge of nose (MOHS)   BCC (basal cell carcinoma) 10/18/2019   right supra orbital region- Cx3 9f   Cataract    both eyes   DIABETES MELLITUS, TYPE II 06/23/2007   type 2   Elevated liver enzymes 03/2017   Elevated tumor markers    GERD (gastroesophageal reflux disease)    HIATAL HERNIA, HX OF 04/29/2007   HYPERLIPIDEMIA 04/29/2007   diet controlled on CoQ10   HYPERTENSION 04/29/2007   HYPOKALEMIA 05/02/2009   HYPOTHYROIDISM 04/29/2007   LOW BACK PAIN 04/29/2007   Neuromuscular disorder ([place]    diabetic neuropathy in both legs   OSTEOARTHRITIS, KNEE, LEFT 06/23/2007   OSTEOPOROSIS NOS 05/12/2007   SPINAL STENOSIS, LUMBAR 04/29/2007   Past Surgical History:  Procedure Laterality Date   BACK  SURGERY N/A 2014 x 2   T1 TO S 1    CATARACT EXTRACTION W/ INTRAOCULAR LENS  IMPLANT, BILATERAL Bilateral 05/2011   doing well   COLONOSCOPY WITH PROPOFOL N/A 04/10/2017   Procedure: COLONOSCOPY WITH PROPOFOL;  Surgeon: JMilus Banister MD;  Location: [facility];  Service: Endoscopy;  Laterality: N/A;   COLONSCOPY  2011   TOTAL KNEE ARTHROPLASTY  2006   right   TOTAL KNEE ARTHROPLASTY Left 2012   Social History   Socioeconomic History   Marital status: Divorced    Spouse name: Not on file   Number of children: 1   Years of education: Not on file   Highest education level: Not on file  Occupational History   Occupation: teller at [employer]   Employer: RETIRED  Tobacco Use   Smoking status: Never   Smokeless tobacco: Never  Vaping Use   Vaping Use: Never used  Substance and Sexual Activity   Alcohol use: Yes    Alcohol/week: 0.0 standard drinks    Comment: One or two drinks in month   Drug use: No   Sexual activity: Never  Other Topics Concern   Not on file  Social History Narrative   Not on file   Social Determinants of Health   Financial Resource Strain: Not on file  Food Insecurity: Not on file  Transportation Needs:  Not on file  Physical Activity: Not on file  Stress: Not on file  Social Connections: Not on file   Family History  Problem Relation Age of Onset   Coronary artery disease Other        Female 1st degree relative   Diabetes Other        1st degree relative   Colon polyps Other    Arthritis Mother    Hypertension Mother    Coronary artery disease Father    Arthritis Brother    Stroke Brother    Hyperlipidemia Brother    COPD Brother    Colon cancer Neg Hx    Esophageal cancer Neg Hx    Rectal cancer Neg Hx    Stomach cancer Neg Hx    No Known Allergies Prior to Admission medications   Medication Sig Start Date End Date Taking? Authorizing Provider  aspirin 81 MG EC tablet Take 81 mg by mouth daily.    [provider]  atenolol  (TENORMIN) 50 MG tablet TAKE 1 TABLET BY MOUTH EVERY DAY 09/28/20   Biagio Borg, MD  blood glucose meter kit and supplies KIT Dispense with patient  Insurance; Use up to four times daily as directed. E11.9 08/12/18   Biagio Borg, MD  Blood Glucose Monitoring Suppl (ONE TOUCH ULTRA 2) w/Device KIT Use as directed up to 4 times per day E11.9 08/12/18   Biagio Borg, MD  Cholecalciferol (VITAMIN D3) 50 MCG (2000 UT) TABS Take 1 tablet by mouth daily.    [provider]  Co-Enzyme Q-10 100 MG CAPS Take 100 mg by mouth daily.    [provider]  cyanocobalamin 1000 MCG tablet Take 1,000 mcg by mouth every other day.    [provider]  fluticasone (FLONASE) 50 MCG/ACT nasal spray Place 2 sprays into both nostrils daily. 09/21/19   Biagio Borg, MD  glimepiride (AMARYL) 4 MG tablet 1 tab by mouth twice per day 08/17/20   Biagio Borg, MD  glucose blood (ONE TOUCH ULTRA TEST) test strip Use as instructed up to 4 times per day  E11.9 08/12/18   Biagio Borg, MD  hydrochlorothiazide (HYDRODIURIL) 25 MG tablet Take 1 tablet (25 mg total) by mouth daily. 04/05/20   Biagio Borg, MD  Lancets MISC Use as directed up to four times per day E11.9 08/12/18   Biagio Borg, MD  levothyroxine (SYNTHROID) 125 MCG tablet TAKE 1 TABLET BY MOUTH EVERY DAY BEFORE BREAKFAST 09/28/20   Biagio Borg, MD  Magnesium 250 MG TABS Take 1 tablet by mouth daily.    [provider]  meclizine (ANTIVERT) 12.5 MG tablet Take 1 tablet (12.5 mg total) by mouth 3 (three) times daily as needed for dizziness. 09/14/18   Janith Lima, MD  meloxicam (MOBIC) 7.5 MG tablet TAKE 1 TABLET BY MOUTH EVERY DAY AS NEEDED FOR PAIN 09/12/20   Biagio Borg, MD  metFORMIN (GLUCOPHAGE) 1000 MG tablet TAKE 1 TABLET BY MOUTH TWICE A DAY WITH A MEAL 07/04/20   Biagio Borg, MD  omeprazole (PRILOSEC) 40 MG capsule TAKE 1 CAPSULE BY MOUTH TWICE A DAY Patient taking differently: Take 40 mg by mouth daily. 01/31/20   Biagio Borg, MD  pioglitazone (ACTOS) 30 MG tablet Take 1 tablet (30 mg total) by mouth daily. 01/26/20   Biagio Borg, MD  predniSONE (DELTASONE) 10 MG tablet 3 tabs by mouth per day for 3 days,2tabs  per day for 3 days,1tab per day for 3 days 11/09/20   Biagio Borg, MD  Red Yeast Rice Extract 600 MG CAPS Take 1,200 mg by mouth 2 (two) times daily.    [provider]  traMADol (ULTRAM) 50 MG tablet TAKE 1 TABLET BY MOUTH EVERY 8 HOURS AS NEEDED 08/02/20   Biagio Borg, MD     Positive ROS: Otherwise negative  All other systems have been reviewed and were otherwise negative with the exception of those mentioned in the HPI and as above.  Physical Exam: Constitutional: Alert, well-appearing, no acute distress.  She does have a nonproductive cough while in the office today.  She has slight wheezing in the office. Ears: External ears without lesions or tenderness. Ear canals with minimal wax buildup on either side.  TMs are clear.  Hearing screening with a tuning forks revealed good hearing in both ears.  Nasal: External nose without lesions. Septum with minimal deformity.  After decongesting the nose both middle meatus regions were clear bilaterally with no signs of of infection.  No polyps noted.. Clear nasal passages bilaterally. Oral: Lips and gums without lesions. Tongue and palate mucosa without lesions. Posterior oropharynx clear.  Indirect laryngoscopy revealed clear base of tongue, vallecula and epiglottis. Neck: No palpable adenopathy or masses Respiratory: Breathing comfortably  Skin: No facial/neck lesions or rash noted.  Procedures  Assessment: No clinical evidence of sinus infection with clear middle meatus bilaterally.  Plan: Patient gives history of reflux disease.  I suspect his chronic cough may be more related to reflux disease than actual sinus infection. I would recommend taking her omeprazole regularly but before dinner instead of first thing in the morning as  reflux may be occurring at night and would recommend taking the omeprazole 40 mg daily but before dinner as this will provide better nighttime coverage of reflux.  Would also recommend regular use of Nasacort 2 sprays each nostril at night on a regular basis as this will provide better nasal symptom coverage.  But did not see evidence of active sinus infection requiring antibiotic therapy.   Radene Journey, MD   CC:

## 2021-02-09 ENCOUNTER — Other Ambulatory Visit: Payer: Self-pay | Admitting: Internal Medicine

## 2021-02-11 ENCOUNTER — Other Ambulatory Visit: Payer: Self-pay | Admitting: Internal Medicine

## 2021-02-11 NOTE — Telephone Encounter (Signed)
Please refill as per office routine med refill policy (all routine meds refilled for 3 mo or monthly per pt preference up to one year from last visit, then month to month grace period for 3 mo, then further med refills will have to be denied)  

## 2021-02-23 ENCOUNTER — Telehealth: Payer: Self-pay

## 2021-02-23 NOTE — Telephone Encounter (Signed)
-----   Message from Stevan Born, [place] sent at 09/20/2020  8:53 AM EST ----- Regarding: RUQ us-liver Pt needs RUQ us-attn liver per DJ in Sept 2022 dx cirr, hepatoma screening.

## 2021-02-23 NOTE — Telephone Encounter (Signed)
Patient advised that she has been scheduled for an abdominal ultrasound at [facility] (1st floor of hospital) on 03-20-21 at 8:30am.  Advised to arrive 15 minutes prior to appointment for registration. Patient advised not to have anything to eat or drink after midnight the night prior to appointment. This test typically takes about 30 minutes to perform.  Patient agreed to plan and verbalized understanding.  No further questions.

## 2021-02-26 ENCOUNTER — Other Ambulatory Visit (INDEPENDENT_AMBULATORY_CARE_PROVIDER_SITE_OTHER): Payer: Medicare HMO

## 2021-02-26 ENCOUNTER — Other Ambulatory Visit: Payer: Medicare HMO

## 2021-02-26 DIAGNOSIS — E119 Type 2 diabetes mellitus without complications: Secondary | ICD-10-CM

## 2021-02-26 DIAGNOSIS — N183 Chronic kidney disease, stage 3 unspecified: Secondary | ICD-10-CM | POA: Diagnosis not present

## 2021-02-26 DIAGNOSIS — E559 Vitamin D deficiency, unspecified: Secondary | ICD-10-CM | POA: Diagnosis not present

## 2021-02-26 LAB — BASIC METABOLIC PANEL
BUN: 27 mg/dL — ABNORMAL HIGH (ref 6–23)
CO2: 30 mEq/L (ref 19–32)
Calcium: 10.3 mg/dL (ref 8.4–10.5)
Chloride: 103 mEq/L (ref 96–112)
Creatinine, Ser: 1.14 mg/dL (ref 0.40–1.20)
GFR: 46.16 mL/min — ABNORMAL LOW (ref >60.00)
Glucose, Bld: 94 mg/dL (ref 70–99)
Potassium: 4.3 mEq/L (ref 3.5–5.1)
Sodium: 141 mEq/L (ref 135–145)

## 2021-02-26 LAB — LIPID PANEL
Cholesterol: 162 mg/dL (ref 0–200)
HDL: 72 mg/dL (ref >39.00)
LDL Cholesterol: 72 mg/dL (ref 0–99)
NonHDL: 90.39
Total CHOL/HDL Ratio: 2
Triglycerides: 91 mg/dL (ref 0.0–149.0)
VLDL: 18.2 mg/dL (ref 0.0–40.0)

## 2021-02-26 LAB — HEPATIC FUNCTION PANEL
ALT: 24 U/L (ref 0–35)
AST: 32 U/L (ref 0–37)
Albumin: 4 g/dL (ref 3.5–5.2)
Alkaline Phosphatase: 67 U/L (ref 39–117)
Bilirubin, Direct: 0.2 mg/dL (ref 0.0–0.3)
Total Bilirubin: 0.8 mg/dL (ref 0.2–1.2)
Total Protein: 6.4 g/dL (ref 6.0–8.3)

## 2021-02-26 LAB — VITAMIN D 25 HYDROXY (VIT D DEFICIENCY, FRACTURES): VITD: 45.33 ng/mL (ref 30.00–100.00)

## 2021-02-26 LAB — HEMOGLOBIN A1C: Hgb A1c MFr Bld: 7 % — ABNORMAL HIGH (ref 4.6–6.5)

## 2021-02-27 ENCOUNTER — Other Ambulatory Visit: Payer: Self-pay | Admitting: Internal Medicine

## 2021-02-27 DIAGNOSIS — K2990 Gastroduodenitis, unspecified, without bleeding: Secondary | ICD-10-CM

## 2021-02-27 DIAGNOSIS — R1013 Epigastric pain: Secondary | ICD-10-CM

## 2021-02-27 DIAGNOSIS — K269 Duodenal ulcer, unspecified as acute or chronic, without hemorrhage or perforation: Secondary | ICD-10-CM

## 2021-02-27 DIAGNOSIS — K2970 Gastritis, unspecified, without bleeding: Secondary | ICD-10-CM

## 2021-02-28 LAB — PTH, INTACT AND CALCIUM
Calcium: 10.1 mg/dL (ref 8.6–10.4)
PTH: 21 pg/mL (ref 16–77)

## 2021-03-01 ENCOUNTER — Ambulatory Visit: Payer: Medicare HMO | Admitting: Internal Medicine

## 2021-03-06 ENCOUNTER — Telehealth: Payer: Self-pay | Admitting: Internal Medicine

## 2021-03-06 ENCOUNTER — Other Ambulatory Visit: Payer: Self-pay

## 2021-03-06 ENCOUNTER — Encounter: Payer: Self-pay | Admitting: Internal Medicine

## 2021-03-06 ENCOUNTER — Ambulatory Visit (INDEPENDENT_AMBULATORY_CARE_PROVIDER_SITE_OTHER): Payer: Medicare HMO | Admitting: Internal Medicine

## 2021-03-06 VITALS — BP 132/80 | HR 74 | Temp 98.0°F | Ht 64.0 in | Wt 227.0 lb

## 2021-03-06 DIAGNOSIS — E559 Vitamin D deficiency, unspecified: Secondary | ICD-10-CM

## 2021-03-06 DIAGNOSIS — E039 Hypothyroidism, unspecified: Secondary | ICD-10-CM | POA: Diagnosis not present

## 2021-03-06 DIAGNOSIS — E7800 Pure hypercholesterolemia, unspecified: Secondary | ICD-10-CM

## 2021-03-06 DIAGNOSIS — I10 Essential (primary) hypertension: Secondary | ICD-10-CM

## 2021-03-06 DIAGNOSIS — G4762 Sleep related leg cramps: Secondary | ICD-10-CM | POA: Diagnosis not present

## 2021-03-06 DIAGNOSIS — I700 Atherosclerosis of aorta: Secondary | ICD-10-CM

## 2021-03-06 DIAGNOSIS — N1831 Chronic kidney disease, stage 3a: Secondary | ICD-10-CM

## 2021-03-06 DIAGNOSIS — E1165 Type 2 diabetes mellitus with hyperglycemia: Secondary | ICD-10-CM

## 2021-03-06 DIAGNOSIS — E538 Deficiency of other specified B group vitamins: Secondary | ICD-10-CM

## 2021-03-06 MED ORDER — TIZANIDINE HCL 2 MG PO TABS: 2.0000 mg | ORAL_TABLET | Freq: Every evening | ORAL | 1 refills | Status: DC | PRN

## 2021-03-06 MED ORDER — GLUCOSE BLOOD VI STRP: ORAL_STRIP | 12 refills | Status: DC

## 2021-03-06 NOTE — Assessment & Plan Note (Addendum)
Lab Results  Component Value Date   CREATININE 1.14 02/26/2021   Mild worsening overall, cont to avoid nephrotoxins, for BP and DM better control

## 2021-03-06 NOTE — Assessment & Plan Note (Addendum)
Lab Results  Component Value Date   VITAMINB12 1,293 (H) 07/27/2020   Stable, ok to reduce oral replacement - b12 1000 mcg mon- wed- fri

## 2021-03-06 NOTE — Assessment & Plan Note (Addendum)
Lab Results  Component Value Date   LDLCALC 72 02/26/2021      Mild uncontrolled, pt declines statin

## 2021-03-06 NOTE — Progress Notes (Signed)
Patient ID: AZURI BOZARD, female   DOB: 29-Jun-1941, 80 y.o.   MRN: [id number]        Chief Complaint: follow up HTN, HLD and hyperglycemia , shin splints, low vit d       HPI:  Kelsey Keller is a 80 y.o. female here overall doing well, Pt denies chest pain, increased sob or doe, wheezing, orthopnea, PND, increased LE swelling, palpitations, dizziness or syncope.   Pt denies polydipsia, polyuria, or new focal neuro s/s.   Pt denies fever, wt loss, night sweats, loss of appetite, or other constitutional symptoms  Does have mild worsening balance issues but no falls, and has walker that helps greatly.  Trying to follow low chol diet, does not want to try statin as she is wary of side effect.  Taking Vit D.  Has gotten away from taking otc magnesium, now with worsening shin splints more to the rght leg, worse at night, asks for restart muscle relaxer as this has helped in the past.        Wt had been 220 but [place].   Denies hyper or hypo thyroid symptoms such as voice, skin or hair change.  Wt Readings from Last 3 Encounters:  03/06/21 227 lb (103 kg)  11/09/20 227 lb (103 kg)  09/20/20 238 lb 2 oz (108 kg)   BP Readings from Last 3 Encounters:  03/06/21 132/80  11/09/20 (!) 152/78  09/20/20 (!) 160/90         Past Medical History:  Diagnosis Date   ALLERGIC RHINITIS 04/29/2007   Allergy    Basal cell carcinoma 03/24/2018   sclerosis- upper bridge of nose (MOHS)   BCC (basal cell carcinoma) 10/18/2019   right supra orbital region- Cx3 44f   Cataract    both eyes   DIABETES MELLITUS, TYPE II 06/23/2007   type 2   Elevated liver enzymes 03/2017   Elevated tumor markers    GERD (gastroesophageal reflux disease)    HIATAL HERNIA, HX OF 04/29/2007   HYPERLIPIDEMIA 04/29/2007   diet controlled on CoQ10   HYPERTENSION 04/29/2007   HYPOKALEMIA 05/02/2009   HYPOTHYROIDISM 04/29/2007   LOW BACK PAIN 04/29/2007   Neuromuscular disorder ([place]    diabetic  neuropathy in both legs   OSTEOARTHRITIS, KNEE, LEFT 06/23/2007   OSTEOPOROSIS NOS 05/12/2007   SPINAL STENOSIS, LUMBAR 04/29/2007   Past Surgical History:  Procedure Laterality Date   BACK SURGERY N/A 2014 x 2   T1 TO S 1    CATARACT EXTRACTION W/ INTRAOCULAR LENS  IMPLANT, BILATERAL Bilateral 05/2011   doing well   COLONOSCOPY WITH PROPOFOL N/A 04/10/2017   Procedure: COLONOSCOPY WITH PROPOFOL;  Surgeon: JMilus Banister MD;  Location: [facility];  Service: Endoscopy;  Laterality: N/A;   COLONSCOPY  2011   TOTAL KNEE ARTHROPLASTY  2006   right   TOTAL KNEE ARTHROPLASTY Left 2012    reports that she has never smoked. She has never used smokeless tobacco. She reports current alcohol use. She reports that she does not use drugs. family history includes Arthritis in her brother and mother; COPD in her brother; Colon polyps in an other family member; Coronary artery disease in her father and another family member; Diabetes in an other family member; Hyperlipidemia in her brother; Hypertension in her mother; Stroke in her brother. No Known Allergies Current Outpatient Medications on File Prior to Visit  Medication Sig Dispense Refill   aspirin 81 MG EC  tablet Take 81 mg by mouth daily.     atenolol (TENORMIN) 50 MG tablet TAKE 1 TABLET BY MOUTH EVERY DAY 90 tablet 3   blood glucose meter kit and supplies KIT Dispense with patient  Insurance; Use up to four times daily as directed. E11.9 1 each 0   Blood Glucose Monitoring Suppl (ONE TOUCH ULTRA 2) w/Device KIT Use as directed up to 4 times per day E11.9 1 each 0   Cholecalciferol (VITAMIN D3) 50 MCG (2000 UT) TABS Take 1 tablet by mouth daily.     Co-Enzyme Q-10 100 MG CAPS Take 100 mg by mouth daily.     cyanocobalamin 1000 MCG tablet Take 1,000 mcg by mouth every other day.     fluticasone (FLONASE) 50 MCG/ACT nasal spray SPRAY 2 SPRAYS INTO EACH NOSTRIL EVERY DAY 48 mL 3   glimepiride (AMARYL) 4 MG tablet 1 tab by mouth twice per day  180 tablet 3   hydrochlorothiazide (HYDRODIURIL) 25 MG tablet TAKE 1 TABLET BY MOUTH EVERY DAY 90 tablet 2   Lancets MISC Use as directed up to four times per day E11.9 200 each 11   levothyroxine (SYNTHROID) 125 MCG tablet TAKE 1 TABLET BY MOUTH EVERY DAY BEFORE BREAKFAST 90 tablet 3   Magnesium 250 MG TABS Take 1 tablet by mouth daily.     meclizine (ANTIVERT) 12.5 MG tablet Take 1 tablet (12.5 mg total) by mouth 3 (three) times daily as needed for dizziness. 90 tablet 1   meloxicam (MOBIC) 7.5 MG tablet TAKE 1 TABLET BY MOUTH EVERY DAY AS NEEDED FOR PAIN 90 tablet 3   metFORMIN (GLUCOPHAGE) 1000 MG tablet TAKE 1 TABLET BY MOUTH TWICE A DAY WITH A MEAL 180 tablet 3   omeprazole (PRILOSEC) 40 MG capsule Take 1 capsule (40 mg total) by mouth daily. 90 capsule 2   PFIZER-BIONT COVID-19 VAC-TRIS SUSP injection      pioglitazone (ACTOS) 30 MG tablet TAKE 1 TABLET BY MOUTH EVERY DAY 90 tablet 3   predniSONE (DELTASONE) 10 MG tablet 3 tabs by mouth per day for 3 days,2tabs per day for 3 days,1tab per day for 3 days 18 tablet 0   Red Yeast Rice Extract 600 MG CAPS Take 1,200 mg by mouth 2 (two) times daily.     traMADol (ULTRAM) 50 MG tablet TAKE 1 TABLET BY MOUTH EVERY 8 HOURS AS NEEDED 90 tablet 4   No current facility-administered medications on file prior to visit.        ROS:  All others reviewed and negative.  Objective        PE:  BP 132/80 (BP Location: Left Arm, Patient Position: Sitting, Cuff Size: Normal)   Pulse 74   Temp 98 F (36.7 C) (Oral)   Ht _0  (1.626 m)   Wt 227 lb (103 kg)   SpO2 96%   BMI 38.96 kg/m                 Constitutional: Pt appears in NAD               HENT: Head: NCAT.                Right Ear: External ear normal.                 Left Ear: External ear normal.                Eyes: . Pupils are equal, round, and reactive to light.  Conjunctivae and EOM are normal               Nose: without d/c or deformity               Neck: Neck supple. Gross  normal ROM               Cardiovascular: Normal rate and regular rhythm.                 Pulmonary/Chest: Effort normal and breath sounds without rales or wheezing.                Abd:  Soft, NT, ND, + BS, no organomegaly               Neurological: Pt is alert. At baseline orientation, motor grossly intact               Skin: Skin is warm. No rashes, no other new lesions, LE edema - none               Psychiatric: Pt behavior is normal without agitation   Micro: none  Cardiac tracings I have personally interpreted today:  none  Pertinent Radiological findings (summarize): none   Lab Results  Component Value Date   WBC 4.4 09/15/2020   HGB 11.5 (L) 09/15/2020   HCT 34.4 (L) 09/15/2020   PLT 170.0 09/15/2020   GLUCOSE 94 02/26/2021   CHOL 162 02/26/2021   TRIG 91.0 02/26/2021   HDL 72.00 02/26/2021   LDLDIRECT 107.5 07/21/2013   LDLCALC 72 02/26/2021   ALT 24 02/26/2021   AST 32 02/26/2021   NA 141 02/26/2021   K 4.3 02/26/2021   CL 103 02/26/2021   CREATININE 1.14 02/26/2021   BUN 27 (H) 02/26/2021   CO2 30 02/26/2021   TSH 4.80 (H) 07/27/2020   INR 1.0 09/15/2020   HGBA1C 7.0 (H) 02/26/2021   MICROALBUR 17.8 (H) 07/27/2020   Assessment/Plan:  Kelsey Keller is a 80 y.o. White or Caucasian [1] female with  has a past medical history of ALLERGIC RHINITIS (04/29/2007), Allergy, Basal cell carcinoma (03/24/2018), BCC (basal cell carcinoma) (10/18/2019), Cataract, DIABETES MELLITUS, TYPE II (06/23/2007), Elevated liver enzymes (03/2017), Elevated tumor markers, GERD (gastroesophageal reflux disease), HIATAL HERNIA, HX OF (04/29/2007), HYPERLIPIDEMIA (04/29/2007), HYPERTENSION (04/29/2007), HYPOKALEMIA (05/02/2009), HYPOTHYROIDISM (04/29/2007), LOW BACK PAIN (04/29/2007), Neuromuscular disorder ([place]), OSTEOARTHRITIS, KNEE, LEFT (06/23/2007), OSTEOPOROSIS NOS (05/12/2007), and SPINAL STENOSIS, LUMBAR (04/29/2007).  Vitamin D deficiency Last vitamin D Lab Results  Component  Value Date   VD25OH 45.33 02/26/2021   Stable, cont oral replacement   B12 deficiency Lab Results  Component Value Date   VITAMINB12 1,293 (H) 07/27/2020   Stable, ok to reduce oral replacement - b12 1000 mcg mon- wed- fri  CKD (chronic kidney disease) stage 3, GFR 30-59 ml/min (HCC) Lab Results  Component Value Date   CREATININE 1.14 02/26/2021   Mild worsening overall, cont to avoid nephrotoxins, for BP and DM better control   Hyperlipidemia Lab Results  Component Value Date   LDLCALC 72 02/26/2021      Mild uncontrolled, pt declines statin   Hypothyroidism Lab Results  Component Value Date   TSH 4.80 (H) 07/27/2020   Has slightly high tsh, pt to continue same levothyroxine as declines change for now  Essential hypertension BP Readings from Last 3 Encounters:  03/06/21 132/80  11/09/20 (!) 152/78  09/20/20 (!) 160/90   Stable, pt to continue medical treatment tenormin  Diabetes Lab Results  Component Value Date   HGBA1C 7.0 (H) 02/26/2021   Mild uncontrolled pt to continue current medical treatment as decliens change from amaryl, metformin, actos   Aortic atherosclerosis (HCC) Pt to continue low chol diet, exercise, delcines statin or zetia for now  Nocturnal leg cramps Ok for restart magnesium otc, cont b12, and tizanidine qhs prn  Followup: No follow-ups on file.  Cathlean Cower, MD 03/10/2021 4:57 PM [facility]

## 2021-03-06 NOTE — Assessment & Plan Note (Signed)
Last vitamin D Lab Results  Component Value Date   VD25OH 45.33 02/26/2021   Stable, cont oral replacement

## 2021-03-06 NOTE — Patient Instructions (Signed)
Please take all new medication as prescribed - the muscle relaxer as needed  Please continue all other medications as before, and refills have been done if requested.  Please have the pharmacy call with any other refills you may need.  Please continue your efforts at being more active, low cholesterol diet, and weight control.  You are otherwise up to date with prevention measures today.  Please keep your appointments with your specialists as you may have planned  Please make an Appointment to return in 6 months, or sooner if needed, also with Lab Appointment for testing done 3-5 days before at the [facility] (so this is for TWO appointments - please see the scheduling desk as you leave)  Due to the ongoing Covid 19 pandemic, our lab now requires an appointment for any labs done at our office.  If you need labs done and do not have an appointment, please call our office ahead of time to schedule before presenting to the lab for your testing.

## 2021-03-10 DIAGNOSIS — G4762 Sleep related leg cramps: Secondary | ICD-10-CM | POA: Insufficient documentation

## 2021-03-10 NOTE — Assessment & Plan Note (Signed)
Lab Results  Component Value Date   TSH 4.80 (H) 07/27/2020   Has slightly high tsh, pt to continue same levothyroxine as declines change for now

## 2021-03-10 NOTE — Assessment & Plan Note (Signed)
Pt to continue low chol diet, exercise, delcines statin or zetia for now

## 2021-03-10 NOTE — Addendum Note (Signed)
Addended by: Biagio Borg on: 03/10/2021 04:59 PM   Modules accepted: Orders

## 2021-03-10 NOTE — Assessment & Plan Note (Signed)
BP Readings from Last 3 Encounters:  03/06/21 132/80  11/09/20 (!) 152/78  09/20/20 (!) 160/90   Stable, pt to continue medical treatment tenormin

## 2021-03-10 NOTE — Assessment & Plan Note (Signed)
Lab Results  Component Value Date   HGBA1C 7.0 (H) 02/26/2021   Mild uncontrolled pt to continue current medical treatment as decliens change from amaryl, metformin, actos

## 2021-03-10 NOTE — Assessment & Plan Note (Signed)
Ok for restart magnesium otc, cont b12, and tizanidine qhs prn

## 2021-03-13 NOTE — Telephone Encounter (Signed)
Test strips refilled to pharmacy on 03/06/21.

## 2021-03-15 DIAGNOSIS — M47816 Spondylosis without myelopathy or radiculopathy, lumbar region: Secondary | ICD-10-CM | POA: Diagnosis not present

## 2021-03-15 DIAGNOSIS — M79606 Pain in leg, unspecified: Secondary | ICD-10-CM | POA: Diagnosis not present

## 2021-03-20 ENCOUNTER — Ambulatory Visit (HOSPITAL_COMMUNITY)
Admission: RE | Admit: 2021-03-20 | Discharge: 2021-03-20 | Disposition: A | Discharge status: Home / Self Care | Payer: Medicare HMO | Source: Ambulatory Visit | Attending: Gastroenterology | Admitting: Gastroenterology

## 2021-03-20 ENCOUNTER — Other Ambulatory Visit: Payer: Self-pay

## 2021-03-20 DIAGNOSIS — K8020 Calculus of gallbladder without cholecystitis without obstruction: Secondary | ICD-10-CM | POA: Diagnosis not present

## 2021-03-20 DIAGNOSIS — R194 Change in bowel habit: Secondary | ICD-10-CM | POA: Insufficient documentation

## 2021-03-20 DIAGNOSIS — K7460 Unspecified cirrhosis of liver: Secondary | ICD-10-CM | POA: Diagnosis not present

## 2021-04-20 ENCOUNTER — Telehealth: Payer: Self-pay | Admitting: Internal Medicine

## 2021-04-20 NOTE — Telephone Encounter (Signed)
Left message for patient to call me back at [phone] to schedule Medicare Annual Wellness Visit   Last AWV  01/20/20  Please schedule at anytime with [facility] if patient calls the office back.    40 Minutes appointment   Any questions, please call me at [phone]

## 2021-04-26 ENCOUNTER — Ambulatory Visit (INDEPENDENT_AMBULATORY_CARE_PROVIDER_SITE_OTHER): Payer: Medicare HMO

## 2021-04-26 ENCOUNTER — Other Ambulatory Visit: Payer: Self-pay

## 2021-04-26 VITALS — BP 140/70 | HR 78 | Temp 98.2°F | Ht 64.0 in | Wt 236.2 lb

## 2021-04-26 DIAGNOSIS — E669 Obesity, unspecified: Secondary | ICD-10-CM

## 2021-04-26 DIAGNOSIS — E1165 Type 2 diabetes mellitus with hyperglycemia: Secondary | ICD-10-CM | POA: Diagnosis not present

## 2021-04-26 DIAGNOSIS — Z0000 Encounter for general adult medical examination without abnormal findings: Secondary | ICD-10-CM | POA: Diagnosis not present

## 2021-04-26 NOTE — Patient Instructions (Signed)
Kelsey Keller , Thank you for taking time to come for your Medicare Wellness Visit. I appreciate your ongoing commitment to your health goals. Please review the following plan we discussed and let me know if I can assist you in the future.   Screening recommendations/referrals: Colonoscopy: last done 04/10/2017 Mammogram: last done 10/16/2020 Bone Density: last done 01/14/2017 Recommended yearly ophthalmology/optometry visit for glaucoma screening and checkup Recommended yearly dental visit for hygiene and checkup  Vaccinations: Influenza vaccine: 03/26/2021 Pneumococcal vaccine: 04/27/2008, 04/15/2013 Tdap vaccine: 01/26/2019; due every 10 years Shingles vaccine: never done   Covid-19: 07/30/2019, 08/20/2019, 04/17/2020  Advanced directives: Yes; documents on file.  Conditions/risks identified: Yes; Client understands the importance of follow-up with providers by attending scheduled visits and discussed goals to eat healthier, increase physical activity, exercise the brain, socialize more, get enough sleep and make time for laughter.  Next appointment: Please schedule your next Medicare Wellness Visit with your Nurse Health Advisor in 1 year by calling [phone].   Preventive Care 24 Years and Older, Female Preventive care refers to lifestyle choices and visits with your health care provider that can promote health and wellness. What does preventive care include? A yearly physical exam. This is also called an annual well check. Dental exams once or twice a year. Routine eye exams. Ask your health care provider how often you should have your eyes checked. Personal lifestyle choices, including: Daily care of your teeth and gums. Regular physical activity. Eating a healthy diet. Avoiding tobacco and drug use. Limiting alcohol use. Practicing safe sex. Taking low-dose aspirin every day. Taking vitamin and mineral supplements as recommended by your health care provider. What happens during an  annual well check? The services and screenings done by your health care provider during your annual well check will depend on your age, overall health, lifestyle risk factors, and family history of disease. Counseling  Your health care provider may ask you questions about your: Alcohol use. Tobacco use. Drug use. Emotional well-being. Home and relationship well-being. Sexual activity. Eating habits. History of falls. Memory and ability to understand (cognition). Work and work Statistician. Reproductive health. Screening  You may have the following tests or measurements: Height, weight, and BMI. Blood pressure. Lipid and cholesterol levels. These may be checked every 5 years, or more frequently if you are over 20 years old. Skin check. Lung cancer screening. You may have this screening every year starting at age 65 if you have a 30-pack-year history of smoking and currently smoke or have quit within the past 15 years. Fecal occult blood test (FOBT) of the stool. You may have this test every year starting at age 34. Flexible sigmoidoscopy or colonoscopy. You may have a sigmoidoscopy every 5 years or a colonoscopy every 10 years starting at age 33. Hepatitis C blood test. Hepatitis B blood test. Sexually transmitted disease (STD) testing. Diabetes screening. This is done by checking your blood sugar (glucose) after you have not eaten for a while (fasting). You may have this done every 1-3 years. Bone density scan. This is done to screen for osteoporosis. You may have this done starting at age 35. Mammogram. This may be done every 1-2 years. Talk to your health care provider about how often you should have regular mammograms. Talk with your health care provider about your test results, treatment options, and if necessary, the need for more tests. Vaccines  Your health care provider may recommend certain vaccines, such as: Influenza vaccine. This is recommended every year. Tetanus,  diphtheria, and acellular pertussis (Tdap, Td) vaccine. You may need a Td booster every 10 years. Zoster vaccine. You may need this after age 37. Pneumococcal 13-valent conjugate (PCV13) vaccine. One dose is recommended after age 4. Pneumococcal polysaccharide (PPSV23) vaccine. One dose is recommended after age 55. Talk to your health care provider about which screenings and vaccines you need and how often you need them. This information is not intended to replace advice given to you by your health care provider. Make sure you discuss any questions you have with your health care provider. Document Released: 07/28/2015 Document Revised: 03/20/2016 Document Reviewed: 05/02/2015 Elsevier Interactive Patient Education  2017 [place] Prevention in the Home Falls can cause injuries. They can happen to people of all ages. There are many things you can do to make your home safe and to help prevent falls. What can I do on the outside of my home? Regularly fix the edges of walkways and driveways and fix any cracks. Remove anything that might make you trip as you walk through a door, such as a raised step or threshold. Trim any bushes or trees on the path to your home. Use bright outdoor lighting. Clear any walking paths of anything that might make someone trip, such as rocks or tools. Regularly check to see if handrails are loose or broken. Make sure that both sides of any steps have handrails. Any raised decks and porches should have guardrails on the edges. Have any leaves, snow, or ice cleared regularly. Use sand or salt on walking paths during winter. Clean up any spills in your garage right away. This includes oil or grease spills. What can I do in the bathroom? Use night lights. Install grab bars by the toilet and in the tub and shower. Do not use towel bars as grab bars. Use non-skid mats or decals in the tub or shower. If you need to sit down in the shower, use a plastic,  non-slip stool. Keep the floor dry. Clean up any water that spills on the floor as soon as it happens. Remove soap buildup in the tub or shower regularly. Attach bath mats securely with double-sided non-slip rug tape. Do not have throw rugs and other things on the floor that can make you trip. What can I do in the bedroom? Use night lights. Make sure that you have a light by your bed that is easy to reach. Do not use any sheets or blankets that are too big for your bed. They should not hang down onto the floor. Have a firm chair that has side arms. You can use this for support while you get dressed. Do not have throw rugs and other things on the floor that can make you trip. What can I do in the kitchen? Clean up any spills right away. Avoid walking on wet floors. Keep items that you use a lot in easy-to-reach places. If you need to reach something above you, use a strong step stool that has a grab bar. Keep electrical cords out of the way. Do not use floor polish or wax that makes floors slippery. If you must use wax, use non-skid floor wax. Do not have throw rugs and other things on the floor that can make you trip. What can I do with my stairs? Do not leave any items on the stairs. Make sure that there are handrails on both sides of the stairs and use them. Fix handrails that are broken or loose. Make sure  that handrails are as long as the stairways. Check any carpeting to make sure that it is firmly attached to the stairs. Fix any carpet that is loose or worn. Avoid having throw rugs at the top or bottom of the stairs. If you do have throw rugs, attach them to the floor with carpet tape. Make sure that you have a light switch at the top of the stairs and the bottom of the stairs. If you do not have them, ask someone to add them for you. What else can I do to help prevent falls? Wear shoes that: Do not have high heels. Have rubber bottoms. Are comfortable and fit you well. Are closed  at the toe. Do not wear sandals. If you use a stepladder: Make sure that it is fully opened. Do not climb a closed stepladder. Make sure that both sides of the stepladder are locked into place. Ask someone to hold it for you, if possible. Clearly mark and make sure that you can see: Any grab bars or handrails. First and last steps. Where the edge of each step is. Use tools that help you move around (mobility aids) if they are needed. These include: Canes. Walkers. Scooters. Crutches. Turn on the lights when you go into a dark area. Replace any light bulbs as soon as they burn out. Set up your furniture so you have a clear path. Avoid moving your furniture around. If any of your floors are uneven, fix them. If there are any pets around you, be aware of where they are. Review your medicines with your doctor. Some medicines can make you feel dizzy. This can increase your chance of falling. Ask your doctor what other things that you can do to help prevent falls. This information is not intended to replace advice given to you by your health care provider. Make sure you discuss any questions you have with your health care provider. Document Released: 04/27/2009 Document Revised: 12/07/2015 Document Reviewed: 08/05/2014 Elsevier Interactive Patient Education  2017 [employer].

## 2021-04-26 NOTE — Progress Notes (Addendum)
Subjective:   Kelsey Keller is a 80 y.o. female who presents for Medicare Annual (Subsequent) preventive examination.  Review of Systems     Cardiac Risk Factors include: advanced age (>70mn, >>85women);diabetes mellitus;dyslipidemia;family history of premature cardiovascular disease;hypertension;obesity (BMI >30kg/m2)     Objective:    Today's Vitals   04/26/21 1001  BP: 140/70  Pulse: 78  Temp: 98.2 F (36.8 C)  SpO2: 95%  Weight: 236 lb 3.2 oz (107.1 kg)  Height: 5' 4" (1.626 m)  PainSc: 0-No pain   Body mass index is 40.54 kg/m.  Advanced Directives 04/26/2021 01/20/2020 07/24/2018 04/10/2017 04/08/2017 04/07/2017 04/04/2017  Does Patient Have a Medical Advance Directive? Yes No _0   Type of Advance Directive - - Healthcare Power of [place] will -  Does patient want to make changes to medical advance directive? No - Patient declined - - - No - Patient declined - -  Copy of [place] Chart? - - No - copy requested No - copy requested - - -  Would patient like information on creating a medical advance directive? - Yes (MAU/Ambulatory/Procedural Areas - Information given) - - - - -    Current Medications (verified) Outpatient Encounter Medications as of 04/26/2021  Medication Sig   aspirin 81 MG EC tablet Take 81 mg by mouth daily.   atenolol (TENORMIN) 50 MG tablet TAKE 1 TABLET BY MOUTH EVERY DAY   blood glucose meter kit and supplies KIT Dispense with patient  Insurance; Use up to four times daily as directed. E11.9   Blood Glucose Monitoring Suppl (ONE TOUCH ULTRA 2) w/Device KIT Use as directed up to 4 times per day E11.9   Cholecalciferol (VITAMIN D3) 50 MCG (2000 UT) TABS Take 1 tablet by mouth daily.   Co-Enzyme Q-10 100 MG CAPS Take 100 mg by mouth daily.   cyanocobalamin 1000 MCG tablet Take 1,000  mcg by mouth every other day.   fluticasone (FLONASE) 50 MCG/ACT nasal spray SPRAY 2 SPRAYS INTO EACH NOSTRIL EVERY DAY   glimepiride (AMARYL) 4 MG tablet 1 tab by mouth twice per day   glucose blood (ONE TOUCH ULTRA TEST) test strip Use as instructed up to 4 times per day  E11.9   hydrochlorothiazide (HYDRODIURIL) 25 MG tablet TAKE 1 TABLET BY MOUTH EVERY DAY   Lancets MISC Use as directed up to four times per day E11.9   levothyroxine (SYNTHROID) 125 MCG tablet TAKE 1 TABLET BY MOUTH EVERY DAY BEFORE BREAKFAST   Magnesium 250 MG TABS Take 1 tablet by mouth daily.   meclizine (ANTIVERT) 12.5 MG tablet Take 1 tablet (12.5 mg total) by mouth 3 (three) times daily as needed for dizziness.   meloxicam (MOBIC) 7.5 MG tablet TAKE 1 TABLET BY MOUTH EVERY DAY AS NEEDED FOR PAIN   metFORMIN (GLUCOPHAGE) 1000 MG tablet TAKE 1 TABLET BY MOUTH TWICE A DAY WITH A MEAL   omeprazole (PRILOSEC) 40 MG capsule Take 1 capsule (40 mg total) by mouth daily.   PFIZER-BIONT COVID-19 VAC-TRIS SUSP injection    pioglitazone (ACTOS) 30 MG tablet TAKE 1 TABLET BY MOUTH EVERY DAY   predniSONE (DELTASONE) 10 MG tablet 3 tabs by mouth per day for 3 days,2tabs per day for 3 days,1tab per day for 3 days   Red Yeast Rice Extract 600 MG CAPS Take 1,200 mg by mouth 2 (two) times  daily.   tiZANidine (ZANAFLEX) 2 MG tablet Take 1 tablet (2 mg total) by mouth at bedtime as needed for muscle spasms.   traMADol (ULTRAM) 50 MG tablet TAKE 1 TABLET BY MOUTH EVERY 8 HOURS AS NEEDED   No facility-administered encounter medications on file as of 04/26/2021.    Allergies (verified) Patient has no known allergies.   History: Past Medical History:  Diagnosis Date   ALLERGIC RHINITIS 04/29/2007   Allergy    Basal cell carcinoma 03/24/2018   sclerosis- upper bridge of nose (MOHS)   BCC (basal cell carcinoma) 10/18/2019   right supra orbital region- Cx3 44f   Cataract    both eyes   DIABETES MELLITUS, TYPE II 06/23/2007   type 2    Elevated liver enzymes 03/2017   Elevated tumor markers    GERD (gastroesophageal reflux disease)    HIATAL HERNIA, HX OF 04/29/2007   HYPERLIPIDEMIA 04/29/2007   diet controlled on CoQ10   HYPERTENSION 04/29/2007   HYPOKALEMIA 05/02/2009   HYPOTHYROIDISM 04/29/2007   LOW BACK PAIN 04/29/2007   Neuromuscular disorder (HBearden    diabetic neuropathy in both legs   OSTEOARTHRITIS, KNEE, LEFT 06/23/2007   OSTEOPOROSIS NOS 05/12/2007   SPINAL STENOSIS, LUMBAR 04/29/2007   Past Surgical History:  Procedure Laterality Date   BACK SURGERY N/A 2014 x 2   T1 TO S 1    CATARACT EXTRACTION W/ INTRAOCULAR LENS  IMPLANT, BILATERAL Bilateral 05/2011   doing well   COLONOSCOPY WITH PROPOFOL N/A 04/10/2017   Procedure: COLONOSCOPY WITH PROPOFOL;  Surgeon: JMilus Banister MD;  Location: [facility];  Service: Endoscopy;  Laterality: N/A;   COLONSCOPY  2011   TOTAL KNEE ARTHROPLASTY  2006   right   TOTAL KNEE ARTHROPLASTY Left 2012   Family History  Problem Relation Age of Onset   Coronary artery disease Other        Female 1st degree relative   Diabetes Other        1st degree relative   Colon polyps Other    Arthritis Mother    Hypertension Mother    Coronary artery disease Father    Arthritis Brother    Stroke Brother    Hyperlipidemia Brother    COPD Brother    Colon cancer Neg Hx    Esophageal cancer Neg Hx    Rectal cancer Neg Hx    Stomach cancer Neg Hx    Social History   Socioeconomic History   Marital status: Divorced    Spouse name: Not on file   Number of children: 1   Years of education: Not on file   Highest education level: Not on file  Occupational History   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  Tobacco Use   Smoking status: Never   Smokeless tobacco: Never  Vaping Use   Vaping Use: Never used  Substance and Sexual Activity   Alcohol use: Yes    Alcohol/week: 0.0 standard drinks    Comment: One or two drinks in month   Drug use: No   Sexual  activity: Never  Other Topics Concern   Not on file  Social History Narrative   Not on file   Social Determinants of Health   Financial Resource Strain: Low Risk    Difficulty of Paying Living Expenses: Not hard at all  Food Insecurity: No Food Insecurity   Worried About RCosmosin the Last Year: Never true   Ran Out of  Food in the Last Year: Never true  Transportation Needs: No Transportation Needs   Lack of Transportation (Medical): No   Lack of Transportation (Non-Medical): No  Physical Activity: Inactive   Days of Exercise per Week: 0 days   Minutes of Exercise per Session: 0 min  Stress: No Stress Concern Present   Feeling of Stress : Not at all  Social Connections: Moderately Integrated   Frequency of Communication with Friends and Family: More than three times a week   Frequency of Social Gatherings with Friends and Family: More than three times a week   Attends Religious Services: More than 4 times per year   Active Member of [employer] or Organizations: Yes   Attends Archivist Meetings: More than 4 times per year   Marital Status: Widowed    Tobacco Counseling Counseling given: Not Answered   Clinical Intake:  Pre-visit preparation completed: Yes  Pain : No/denies pain Pain Score: 0-No pain     BMI - recorded: 40.54 Nutritional Status: BMI > 30  Obese Nutritional Risks: None Diabetes: Yes CBG done?: No Did pt. bring in CBG monitor from home?: No  How often do you need to have someone help you when you read instructions, pamphlets, or other written materials from your doctor or pharmacy?: 1 - Never What is the last grade level you completed in school?: High School Graduate  Diabetic? yes  Interpreter Needed?: No  Information entered by :: Lisette Abu, LPN   Activities of Daily Living In your present state of health, do you have any difficulty performing the following activities: 04/26/2021  Hearing? N  Vision? N   Difficulty concentrating or making decisions? N  Walking or climbing stairs? Y  Dressing or bathing? N  Doing errands, shopping? N  Preparing Food and eating ? N  Using the Toilet? N  In the past six months, have you accidently leaked urine? Y  Do you have problems with loss of bowel control? N  Managing your Medications? N  Managing your Finances? N  Housekeeping or managing your Housekeeping? N  Some recent data might be hidden    Patient Care Team: Biagio Borg, MD as PCP - [facility] as Consulting Physician (Optometry)  Indicate any recent Medical Services you may have received from other than [facility] providers in the past year (date may be approximate).     Assessment:   This is a routine wellness examination for [facility].  Hearing/Vision screen Hearing Screening - Comments:: Patient denied any hearing difficulty.   No hearing aids.  Vision Screening - Comments:: Patient wears corrective glasses/contacts.  Eye exam done annually by: [facility]  Dietary issues and exercise activities discussed: Current Exercise Habits: The patient does not participate in regular exercise at present, Exercise limited by: orthopedic condition(s);neurologic condition(s);respiratory conditions(s);cardiac condition(s)   Goals Addressed               This Visit's Progress     Patient Stated (pt-stated)        Would like to join a Diabetes/Weight Management Program to help reach my goals.      Depression Screen PHQ 2/9 Scores 04/26/2021 11/09/2020 08/17/2020 01/20/2020 07/29/2019 07/24/2018 07/24/2018  PHQ - 2 Score 0 0 0 0 0 0 0  PHQ- 9 Score - - 0 - - - -    Fall Risk Fall Risk  04/26/2021 11/09/2020 08/17/2020 01/20/2020 09/21/2019  Falls in the past year? 0 _0 1  Comment - - - - -  Number falls in past yr: 0 _0 0  Comment - - - - -  Injury with Fall? 0 0 0 1 1  Risk for fall due to : No Fall Risks History of fall(s) - Impaired balance/gait;Orthopedic  patient -  Follow up Falls evaluation completed - - Falls evaluation completed;Education provided -    FALL RISK PREVENTION PERTAINING TO THE HOME:  Any stairs in or around the home? No  If so, are there any without handrails? No  Home free of loose throw rugs in walkways, pet beds, electrical cords, etc? Yes  Adequate lighting in your home to reduce risk of falls? Yes   ASSISTIVE DEVICES UTILIZED TO PREVENT FALLS:  Life alert? No  Use of a cane, walker or w/c? Yes  Grab bars in the bathroom? Yes  Shower chair or bench in shower? Yes  Elevated toilet seat or a handicapped toilet? Yes   TIMED UP AND GO:  Was the test performed? Yes .  Length of time to ambulate 10 feet: 8 sec.   Gait steady and fast with assistive device  Cognitive Function: Normal cognitive status assessed by direct observation by this Nurse Health Advisor. No abnormalities found.   MMSE - Mini Mental State Exam 10/19/2014  Orientation to time 5  Orientation to Place 5  Registration 3  Attention/ Calculation 3  Language- name 2 objects 2  Language- repeat 1  Language- follow 3 step command 3  Language- read & follow direction 1  Write a sentence 1  Copy design 1     6CIT Screen 01/20/2020  What Year? 0 points  What month? 0 points  What time? 3 points  Count back from 20 0 points  Months in reverse 0 points  Repeat phrase 0 points  Total Score 3    Immunizations Immunization History  Administered Date(s) Administered   Fluad Quad(high Dose 65+) 03/26/2021   Influenza, High Dose Seasonal PF 04/15/2018, 03/20/2019, 04/10/2020   Influenza-Unspecified 05/05/2017, 04/15/2018   PFIZER(Purple Top)SARS-COV-2 Vaccination 07/30/2019, 08/20/2019, 04/17/2020   Pneumococcal Conjugate-13 04/15/2013   Pneumococcal Polysaccharide-23 04/27/2008   Td 10/25/2008   Tdap 01/26/2019   Zoster, Live 10/20/2007    TDAP status: Up to date  Flu Vaccine status: Up to date  Pneumococcal vaccine status: Up to  date  Covid-19 vaccine status: Completed vaccines  Qualifies for Shingles Vaccine? Yes   Zostavax completed Yes   Shingrix Completed?: No.    Education has been provided regarding the importance of this vaccine. Patient has been advised to call insurance company to determine out of pocket expense if they have not yet received this vaccine. Advised may also receive vaccine at local pharmacy or Health Dept. Verbalized acceptance and understanding.  Screening Tests Health Maintenance  Topic Date Due   COVID-19 Vaccine (4 - Booster for Pfizer series) 07/10/2020   Zoster Vaccines- Shingrix (1 of 2) 06/06/2021 (Originally 10/25/1959)   URINE MICROALBUMIN  07/27/2021   FOOT EXAM  08/17/2021   HEMOGLOBIN A1C  08/29/2021   OPHTHALMOLOGY EXAM  11/29/2021   TETANUS/TDAP  01/25/2029   INFLUENZA VACCINE  Completed   DEXA SCAN  Completed   Hepatitis C Screening  Completed   HPV VACCINES  Aged Out    Health Maintenance  Health Maintenance Due  Topic Date Due   COVID-19 Vaccine (4 - Booster for [place] series) 07/10/2020    Colorectal cancer screening: No longer required.   Mammogram status: Completed  10/16/2020. Repeat every year  Bone density status: no longer required  Lung Cancer Screening: (Low Dose CT Chest recommended if Age 72-80 years, 30 pack-year currently smoking OR have quit w/in 15years.) does not qualify.   Lung Cancer Screening Referral: no  Additional Screening:  Hepatitis C Screening: does qualify; Completed yes  Vision Screening: Recommended annual ophthalmology exams for early detection of glaucoma and other disorders of the eye. Is the patient up to date with their annual eye exam?  Yes  Who is the provider or what is the name of the office in which the patient attends annual eye exams? [facility] If pt is not established with a provider, would they like to be referred to a provider to establish care? No .   Dental Screening: Recommended annual dental exams for  proper oral hygiene  Community Resource Referral / Chronic Care Management: CRR required this visit?  No   CCM required this visit?  No      Plan:     I have personally reviewed and noted the following in the patient's chart:   Medical and social history Use of alcohol, tobacco or illicit drugs  Current medications and supplements including opioid prescriptions.  Functional ability and status Nutritional status Physical activity Advanced directives List of other physicians Hospitalizations, surgeries, and ER visits in previous 12 months Vitals Screenings to include cognitive, depression, and falls Referrals and appointments  In addition, I have reviewed and discussed with patient certain preventive protocols, quality metrics, and best practice recommendations. A written personalized care plan for preventive services as well as general preventive health recommendations were provided to patient.     Sheral Flow, LPN   [id number]   Nurse Notes:  Hearing Screening - Comments:: Patient denied any hearing difficulty.   No hearing aids.  Vision Screening - Comments:: Patient wears corrective glasses/contacts.  Eye exam done annually by: [facility]   Medical screening examination/treatment/procedure(s) were performed by non-physician practitioner and as supervising physician I was immediately available for consultation/collaboration.  I agree with above. Cathlean Cower, MD

## 2021-05-15 ENCOUNTER — Telehealth: Payer: Self-pay | Admitting: Internal Medicine

## 2021-05-15 DIAGNOSIS — L03116 Cellulitis of left lower limb: Secondary | ICD-10-CM | POA: Diagnosis not present

## 2021-05-15 NOTE — Telephone Encounter (Signed)
Patient states she hit her leg on a shopping cart and developed a bruise x1w  Patient states the bruise is leaving and there is a bubble developing in the same spot  Informed patient Dr. Jenny Reichmann was not in the office this week, offered patient appt w/ another provider, patient declined  Patient stated she may go to urgent care

## 2021-06-18 DIAGNOSIS — G5703 Lesion of sciatic nerve, bilateral lower limbs: Secondary | ICD-10-CM | POA: Diagnosis not present

## 2021-07-27 ENCOUNTER — Telehealth: Payer: Self-pay | Admitting: Gastroenterology

## 2021-07-27 NOTE — Telephone Encounter (Signed)
Patient called said she usually needs to get an [place] prior to office visit and would like to know if is her next step.

## 2021-07-27 NOTE — Telephone Encounter (Signed)
The pt has been advised that her last [place] was in September 2022 and needs to have another in 6 months.  She is due in March 2023.  She was told that we will call her at that time to set up.  The pt has been advised of the information and verbalized understanding.

## 2021-08-01 ENCOUNTER — Other Ambulatory Visit: Payer: Self-pay | Admitting: Internal Medicine

## 2021-08-01 NOTE — Telephone Encounter (Signed)
Please refill as per office routine med refill policy (all routine meds to be refilled for 3 mo or monthly (per pt preference) up to one year from last visit, then month to month grace period for 3 mo, then further med refills will have to be denied) ? ?

## 2021-08-10 ENCOUNTER — Other Ambulatory Visit: Payer: Self-pay | Admitting: Internal Medicine

## 2021-08-31 ENCOUNTER — Other Ambulatory Visit: Payer: Self-pay

## 2021-08-31 ENCOUNTER — Other Ambulatory Visit (INDEPENDENT_AMBULATORY_CARE_PROVIDER_SITE_OTHER): Payer: Medicare HMO

## 2021-08-31 DIAGNOSIS — E559 Vitamin D deficiency, unspecified: Secondary | ICD-10-CM | POA: Diagnosis not present

## 2021-08-31 DIAGNOSIS — E1165 Type 2 diabetes mellitus with hyperglycemia: Secondary | ICD-10-CM | POA: Diagnosis not present

## 2021-08-31 DIAGNOSIS — E538 Deficiency of other specified B group vitamins: Secondary | ICD-10-CM | POA: Diagnosis not present

## 2021-08-31 LAB — URINALYSIS, ROUTINE W REFLEX MICROSCOPIC
Bilirubin Urine: NEGATIVE
Hgb urine dipstick: NEGATIVE
Ketones, ur: NEGATIVE
Leukocytes,Ua: NEGATIVE
Nitrite: NEGATIVE
Specific Gravity, Urine: 1.025 (ref 1.000–1.030)
Total Protein, Urine: NEGATIVE
Urine Glucose: NEGATIVE
Urobilinogen, UA: 0.2 (ref 0.0–1.0)
pH: 5.5 (ref 5.0–8.0)

## 2021-08-31 LAB — HEPATIC FUNCTION PANEL
ALT: 21 U/L (ref 0–35)
AST: 36 U/L (ref 0–37)
Albumin: 4.2 g/dL (ref 3.5–5.2)
Alkaline Phosphatase: 61 U/L (ref 39–117)
Bilirubin, Direct: 0.2 mg/dL (ref 0.0–0.3)
Total Bilirubin: 0.9 mg/dL (ref 0.2–1.2)
Total Protein: 6.8 g/dL (ref 6.0–8.3)

## 2021-08-31 LAB — LIPID PANEL
Cholesterol: 144 mg/dL (ref 0–200)
HDL: 65 mg/dL (ref >39.00)
LDL Cholesterol: 58 mg/dL (ref 0–99)
NonHDL: 78.99
Total CHOL/HDL Ratio: 2
Triglycerides: 106 mg/dL (ref 0.0–149.0)
VLDL: 21.2 mg/dL (ref 0.0–40.0)

## 2021-08-31 LAB — HEMOGLOBIN A1C: Hgb A1c MFr Bld: 6.5 % (ref 4.6–6.5)

## 2021-08-31 LAB — CBC WITH DIFFERENTIAL/PLATELET
Basophils Absolute: 0 10*3/uL (ref 0.0–0.1)
Basophils Relative: 0.7 % (ref 0.0–3.0)
Eosinophils Absolute: 0.2 10*3/uL (ref 0.0–0.7)
Eosinophils Relative: 3.8 % (ref 0.0–5.0)
HCT: 33.5 % — ABNORMAL LOW (ref 36.0–46.0)
Hemoglobin: 11.3 g/dL — ABNORMAL LOW (ref 12.0–15.0)
Lymphocytes Relative: 26.9 % (ref 12.0–46.0)
Lymphs Abs: 1.2 10*3/uL (ref 0.7–4.0)
MCHC: 33.7 g/dL (ref 30.0–36.0)
MCV: 89.5 fl (ref 78.0–100.0)
Monocytes Absolute: 0.5 10*3/uL (ref 0.1–1.0)
Monocytes Relative: 10.5 % (ref 3.0–12.0)
Neutro Abs: 2.6 10*3/uL (ref 1.4–7.7)
Neutrophils Relative %: 58.1 % (ref 43.0–77.0)
Platelets: 151 10*3/uL (ref 150.0–400.0)
RBC: 3.75 Mil/uL — ABNORMAL LOW (ref 3.87–5.11)
RDW: 15.8 % — ABNORMAL HIGH (ref 11.5–15.5)
WBC: 4.5 10*3/uL (ref 4.0–10.5)

## 2021-08-31 LAB — BASIC METABOLIC PANEL
BUN: 34 mg/dL — ABNORMAL HIGH (ref 6–23)
CO2: 33 mEq/L — ABNORMAL HIGH (ref 19–32)
Calcium: 10.4 mg/dL (ref 8.4–10.5)
Chloride: 102 mEq/L (ref 96–112)
Creatinine, Ser: 1.08 mg/dL (ref 0.40–1.20)
GFR: 49.08 mL/min — ABNORMAL LOW (ref >60.00)
Glucose, Bld: 83 mg/dL (ref 70–99)
Potassium: 3.9 mEq/L (ref 3.5–5.1)
Sodium: 139 mEq/L (ref 135–145)

## 2021-08-31 LAB — MICROALBUMIN / CREATININE URINE RATIO
Creatinine,U: 162.6 mg/dL
Microalb Creat Ratio: 2 mg/g (ref 0.0–30.0)
Microalb, Ur: 3.2 mg/dL — ABNORMAL HIGH (ref 0.0–1.9)

## 2021-08-31 LAB — VITAMIN B12: Vitamin B-12: 1044 pg/mL — ABNORMAL HIGH (ref 211–911)

## 2021-08-31 LAB — VITAMIN D 25 HYDROXY (VIT D DEFICIENCY, FRACTURES): VITD: 57.33 ng/mL (ref 30.00–100.00)

## 2021-08-31 LAB — TSH: TSH: 2.47 u[IU]/mL (ref 0.35–5.50)

## 2021-09-03 ENCOUNTER — Other Ambulatory Visit: Payer: Medicare HMO

## 2021-09-06 ENCOUNTER — Telehealth: Payer: Self-pay | Admitting: Internal Medicine

## 2021-09-06 ENCOUNTER — Other Ambulatory Visit: Payer: Self-pay

## 2021-09-06 ENCOUNTER — Encounter: Payer: Self-pay | Admitting: Internal Medicine

## 2021-09-06 ENCOUNTER — Ambulatory Visit (INDEPENDENT_AMBULATORY_CARE_PROVIDER_SITE_OTHER): Payer: Medicare HMO | Admitting: Internal Medicine

## 2021-09-06 VITALS — BP 122/60 | HR 70 | Temp 98.1°F | Ht 64.0 in | Wt 226.0 lb

## 2021-09-06 DIAGNOSIS — E039 Hypothyroidism, unspecified: Secondary | ICD-10-CM | POA: Diagnosis not present

## 2021-09-06 DIAGNOSIS — I10 Essential (primary) hypertension: Secondary | ICD-10-CM

## 2021-09-06 DIAGNOSIS — G5601 Carpal tunnel syndrome, right upper limb: Secondary | ICD-10-CM | POA: Diagnosis not present

## 2021-09-06 DIAGNOSIS — Z0001 Encounter for general adult medical examination with abnormal findings: Secondary | ICD-10-CM

## 2021-09-06 DIAGNOSIS — I700 Atherosclerosis of aorta: Secondary | ICD-10-CM

## 2021-09-06 DIAGNOSIS — E559 Vitamin D deficiency, unspecified: Secondary | ICD-10-CM

## 2021-09-06 DIAGNOSIS — N1831 Chronic kidney disease, stage 3a: Secondary | ICD-10-CM

## 2021-09-06 DIAGNOSIS — E1165 Type 2 diabetes mellitus with hyperglycemia: Secondary | ICD-10-CM

## 2021-09-06 DIAGNOSIS — E538 Deficiency of other specified B group vitamins: Secondary | ICD-10-CM | POA: Diagnosis not present

## 2021-09-06 DIAGNOSIS — D649 Anemia, unspecified: Secondary | ICD-10-CM

## 2021-09-06 DIAGNOSIS — E7800 Pure hypercholesterolemia, unspecified: Secondary | ICD-10-CM

## 2021-09-06 MED ORDER — TRAMADOL HCL 50 MG PO TABS
50.0000 mg | ORAL_TABLET | Freq: Three times a day (TID) | ORAL | 5 refills | Status: DC | PRN
Start: 2021-09-06 — End: 2022-04-29

## 2021-09-06 MED ORDER — GLIMEPIRIDE 4 MG PO TABS: 2.0000 mg | ORAL_TABLET | Freq: Two times a day (BID) | ORAL | 3 refills | Status: DC

## 2021-09-06 NOTE — Progress Notes (Signed)
Patient ID: Kelsey Keller, female   DOB: May 31, 1941, 81 y.o.   MRN: [id number]         Chief Complaint:: wellness exam and Follow-up  Right wrist ganglion cyst, right CTS symptoms, dm, chronic pain, low b12       HPI:  Kelsey Keller is a 81 y.o. female here for wellness exam; declines shingrix o/w up to date                        Also has cane in the house, and walker for here, has rollater as well. Leg cramps improved with current med qhs.  Going to [facility] and has lost several lbs.  Does have knot with tenderness comes and goes as well as tingling discomfort to the hand mild intermittent for over 1 month.   Pt denies polydipsia, polyuria, or new focal neuro s/s.   Taking B12 daily.  Needs tramadol refill for ongoing arthritic pain.  Pt denies chest pain, increased sob or doe, wheezing, orthopnea, PND, increased LE swelling, palpitations, dizziness or syncope.   Pt denies fever, wt loss, night sweats, loss of appetite, or other constitutional symptoms .   Wt Readings from Last 3 Encounters:  09/06/21 226 lb (102.5 kg)  04/26/21 236 lb 3.2 oz (107.1 kg)  03/06/21 227 lb (103 kg)   BP Readings from Last 3 Encounters:  09/06/21 122/60  04/26/21 140/70  03/06/21 132/80   Immunization History  Administered Date(s) Administered   Fluad Quad(high Dose 65+) 03/26/2021   Influenza, High Dose Seasonal PF 04/15/2018, 03/20/2019, 04/10/2020, 03/26/2021   Influenza-Unspecified 05/05/2017, 04/15/2018   PFIZER(Purple Top)SARS-COV-2 Vaccination 07/30/2019, 08/20/2019, 04/17/2020   Pfizer Covid-19 Vaccine Bivalent Booster 25yr & up 05/16/2021   Pneumococcal Conjugate-13 04/15/2013   Pneumococcal Polysaccharide-23 04/27/2008   Td 10/25/2008   Tdap 01/26/2019   Zoster, Live 10/20/2007   There are no preventive care reminders to display for this patient.     Past Medical History:  Diagnosis Date   ALLERGIC RHINITIS 04/29/2007   Allergy    Basal cell carcinoma 03/24/2018   sclerosis-  upper bridge of nose (MOHS)   BCC (basal cell carcinoma) 10/18/2019   right supra orbital region- Cx3 565f  Cataract    both eyes   DIABETES MELLITUS, TYPE II 06/23/2007   type 2   Elevated liver enzymes 03/2017   Elevated tumor markers    GERD (gastroesophageal reflux disease)    HIATAL HERNIA, HX OF 04/29/2007   HYPERLIPIDEMIA 04/29/2007   diet controlled on CoQ10   HYPERTENSION 04/29/2007   HYPOKALEMIA 05/02/2009   HYPOTHYROIDISM 04/29/2007   LOW BACK PAIN 04/29/2007   Neuromuscular disorder ([place]   diabetic neuropathy in both legs   OSTEOARTHRITIS, KNEE, LEFT 06/23/2007   OSTEOPOROSIS NOS 05/12/2007   SPINAL STENOSIS, LUMBAR 04/29/2007   Past Surgical History:  Procedure Laterality Date   BACK SURGERY N/A 2014 x 2   T1 TO S 1    CATARACT EXTRACTION W/ INTRAOCULAR LENS  IMPLANT, BILATERAL Bilateral 05/2011   doing well   COLONOSCOPY WITH PROPOFOL N/A 04/10/2017   Procedure: COLONOSCOPY WITH PROPOFOL;  Surgeon: JaMilus BanisterMD;  Location: [facility];  Service: Endoscopy;  Laterality: N/A;   COLONSCOPY  2011   TOTAL KNEE ARTHROPLASTY  2006   right   TOTAL KNEE ARTHROPLASTY Left 2012    reports that she has never smoked. She has never used smokeless tobacco. She reports current alcohol use.  She reports that she does not use drugs. family history includes Arthritis in her brother and mother; COPD in her brother; Colon polyps in an other family member; Coronary artery disease in her father and another family member; Diabetes in an other family member; Hyperlipidemia in her brother; Hypertension in her mother; Stroke in her brother. No Known Allergies Current Outpatient Medications on File Prior to Visit  Medication Sig Dispense Refill   aspirin 81 MG EC tablet Take 81 mg by mouth daily.     atenolol (TENORMIN) 50 MG tablet TAKE 1 TABLET BY MOUTH EVERY DAY 90 tablet 1   blood glucose meter kit and supplies KIT Dispense with patient  Insurance; Use up to four times daily  as directed. E11.9 1 each 0   Blood Glucose Monitoring Suppl (ONE TOUCH ULTRA 2) w/Device KIT Use as directed up to 4 times per day E11.9 1 each 0   Cholecalciferol (VITAMIN D3) 50 MCG (2000 UT) TABS Take 1 tablet by mouth daily.     Co-Enzyme Q-10 100 MG CAPS Take 100 mg by mouth daily.     cyanocobalamin 1000 MCG tablet Take 1,000 mcg by mouth every other day.     fluticasone (FLONASE) 50 MCG/ACT nasal spray SPRAY 2 SPRAYS INTO EACH NOSTRIL EVERY DAY 48 mL 3   glucose blood (ONE TOUCH ULTRA TEST) test strip Use as instructed up to 4 times per day  E11.9 100 each 12   hydrochlorothiazide (HYDRODIURIL) 25 MG tablet TAKE 1 TABLET BY MOUTH EVERY DAY 90 tablet 2   Lancets MISC Use as directed up to four times per day E11.9 200 each 11   levothyroxine (SYNTHROID) 125 MCG tablet TAKE 1 TABLET BY MOUTH EVERY DAY BEFORE BREAKFAST 90 tablet 1   Magnesium 250 MG TABS Take 1 tablet by mouth daily.     meclizine (ANTIVERT) 12.5 MG tablet Take 1 tablet (12.5 mg total) by mouth 3 (three) times daily as needed for dizziness. 90 tablet 1   meloxicam (MOBIC) 7.5 MG tablet TAKE 1 TABLET BY MOUTH EVERY DAY AS NEEDED FOR PAIN 90 tablet 1   metFORMIN (GLUCOPHAGE) 1000 MG tablet TAKE 1 TABLET BY MOUTH TWICE A DAY WITH MEALS 180 tablet 1   omeprazole (PRILOSEC) 40 MG capsule Take 1 capsule (40 mg total) by mouth daily. 90 capsule 2   PFIZER-BIONT COVID-19 VAC-TRIS SUSP injection      pioglitazone (ACTOS) 30 MG tablet TAKE 1 TABLET BY MOUTH EVERY DAY 90 tablet 3   predniSONE (DELTASONE) 10 MG tablet 3 tabs by mouth per day for 3 days,2tabs per day for 3 days,1tab per day for 3 days 18 tablet 0   Red Yeast Rice Extract 600 MG CAPS Take 1,200 mg by mouth 2 (two) times daily.     tiZANidine (ZANAFLEX) 2 MG tablet Take 1 tablet (2 mg total) by mouth at bedtime as needed for muscle spasms. 90 tablet 1   No current facility-administered medications on file prior to visit.        ROS:  All others reviewed and  negative.  Objective        PE:  BP 122/60 (BP Location: Left Arm, Patient Position: Sitting, Cuff Size: Large)    Pulse 70    Temp 98.1 F (36.7 C) (Oral)    Ht _0  (1.626 m)    Wt 226 lb (102.5 kg)    SpO2 97%    BMI 38.79 kg/m  Constitutional: Pt appears in NAD               HENT: Head: NCAT.                Right Ear: External ear normal.                 Left Ear: External ear normal.                Eyes: . Pupils are equal, round, and reactive to light. Conjunctivae and EOM are normal               Nose: without d/c or deformity               Neck: Neck supple. Gross normal ROM               Cardiovascular: Normal rate and regular rhythm.                 Pulmonary/Chest: Effort normal and breath sounds without rales or wheezing.                Abd:  Soft, NT, ND, + BS, no organomegaly               Neurological: Pt is alert. At baseline orientation, motor grossly intact               Skin: Skin is warm. No rashes, no other new lesions, LE edema - none; right wrist with dorsal < 1/2 cm tender cyst               Psychiatric: Pt behavior is normal without agitation   Micro: none  Cardiac tracings I have personally interpreted today:  none  Pertinent Radiological findings (summarize): none   Lab Results  Component Value Date   WBC 4.5 08/31/2021   HGB 11.3 (L) 08/31/2021   HCT 33.5 (L) 08/31/2021   PLT 151.0 08/31/2021   GLUCOSE 83 08/31/2021   CHOL 144 08/31/2021   TRIG 106.0 08/31/2021   HDL 65.00 08/31/2021   LDLDIRECT 107.5 07/21/2013   LDLCALC 58 08/31/2021   ALT 21 08/31/2021   AST 36 08/31/2021   NA 139 08/31/2021   K 3.9 08/31/2021   CL 102 08/31/2021   CREATININE 1.08 08/31/2021   BUN 34 (H) 08/31/2021   CO2 33 (H) 08/31/2021   TSH 2.47 08/31/2021   INR 1.0 09/15/2020   HGBA1C 6.5 08/31/2021   MICROALBUR 3.2 (H) 08/31/2021   Assessment/Plan:  Kelsey Keller is a 81 y.o. White or Caucasian [1] female with  has a past medical history of  ALLERGIC RHINITIS (04/29/2007), Allergy, Basal cell carcinoma (03/24/2018), BCC (basal cell carcinoma) (10/18/2019), Cataract, DIABETES MELLITUS, TYPE II (06/23/2007), Elevated liver enzymes (03/2017), Elevated tumor markers, GERD (gastroesophageal reflux disease), HIATAL HERNIA, HX OF (04/29/2007), HYPERLIPIDEMIA (04/29/2007), HYPERTENSION (04/29/2007), HYPOKALEMIA (05/02/2009), HYPOTHYROIDISM (04/29/2007), LOW BACK PAIN (04/29/2007), Neuromuscular disorder ([place]), OSTEOARTHRITIS, KNEE, LEFT (06/23/2007), OSTEOPOROSIS NOS (05/12/2007), and SPINAL STENOSIS, LUMBAR (04/29/2007).  Encounter for well adult exam with abnormal findings Age and sex appropriate education and counseling updated with regular exercise and diet Referrals for preventative services - none needed Immunizations addressed - decilnes shingrix Smoking counseling  - none needed Evidence for depression or other mood disorder - none significant Most recent labs reviewed. I have personally reviewed and have noted: 1) the patient's medical and social history 2) The patient's current medications and supplements 3) The patient's height, weight, and BMI have been recorded in  the chart   Aortic atherosclerosis (HCC) Lab Results  Component Value Date   LDLCALC 58 08/31/2021   Stable, pt to continue current low chol diet   B12 deficiency Lab Results  Component Value Date   VITAMINB12 1,044 (H) 08/31/2021   Mild increased, pt to decrease oral replacement - b12 1000 mcg to mon-wed-fri  CKD (chronic kidney disease) stage 3, GFR 30-59 ml/min (HCC) Lab Results  Component Value Date   CREATININE 1.08 08/31/2021   Stable overall, cont to avoid nephrotoxins   Diabetes Lab Results  Component Value Date   HGBA1C 6.5 08/31/2021   overcontrolled given age, pt to continue current medical treatment metformin, actos, but decrease the glimeparide 29m to HALF bid  Essential hypertension BP Readings from Last 3 Encounters:  09/06/21  122/60  04/26/21 140/70  03/06/21 132/80   Stable, pt to continue medical treatment tenormin, hct   Hypothyroidism Lab Results  Component Value Date   TSH 2.47 08/31/2021   Stable, pt to continue levothyroxine   Right carpal tunnel syndrome With ganglion cyst and mild cts symptoms - for hand surgury referral  Followup: Return in about 6 months (around 03/06/2022).  JCathlean Cower MD 09/08/2021 7:19 PM [facility]

## 2021-09-06 NOTE — Telephone Encounter (Signed)
Patient calling in  Wanting to make sure provider did not forget to put lab orders in for 23mo fu in 02/2022 so she can go ahead & schedule lab appt  Please let patient know when orders have been placed [phone]

## 2021-09-06 NOTE — Patient Instructions (Signed)
Ok to decrease the glimeparide 4 mg pill to HALF twice per day  Please continue all other medications as before, and refills have been done if requested - the tramadol  Ok to decrease the B12 to Mon- Wed -Friday  You are given the note for the trash cans at the home  Please have the pharmacy call with any other refills you may need.  Please continue your efforts at being more active, low cholesterol diet, and weight control.  You are otherwise up to date with prevention measures today.  Please keep your appointments with your specialists as you may have planned  You will be contacted regarding the referral for: hand surgury consult  Please make an Appointment to return in 6 months, or sooner if needed, also with Lab Appointment for testing done 3-5 days before at the [facility] (so this is for TWO appointments - please see the scheduling desk as you leave)  Due to the ongoing Covid 19 pandemic, our lab now requires an appointment for any labs done at our office.  If you need labs done and do not have an appointment, please call our office ahead of time to schedule before presenting to the lab for your testing.

## 2021-09-06 NOTE — Telephone Encounter (Signed)
Ok this is done 

## 2021-09-07 NOTE — Telephone Encounter (Signed)
Called patient to inform her that her lab orders was put in .

## 2021-09-08 ENCOUNTER — Encounter: Payer: Self-pay | Admitting: Internal Medicine

## 2021-09-08 DIAGNOSIS — G5601 Carpal tunnel syndrome, right upper limb: Secondary | ICD-10-CM | POA: Insufficient documentation

## 2021-09-08 NOTE — Assessment & Plan Note (Signed)
With ganglion cyst and mild cts symptoms - for hand surgury referral

## 2021-09-08 NOTE — Assessment & Plan Note (Signed)
Lab Results  Component Value Date   TSH 2.47 08/31/2021   Stable, pt to continue levothyroxine

## 2021-09-08 NOTE — Addendum Note (Signed)
Addended by: Biagio Borg on: 09/08/2021 07:21 PM   Modules accepted: Orders

## 2021-09-08 NOTE — Assessment & Plan Note (Signed)
BP Readings from Last 3 Encounters:  09/06/21 122/60  04/26/21 140/70  03/06/21 132/80   Stable, pt to continue medical treatment tenormin, hct

## 2021-09-08 NOTE — Assessment & Plan Note (Signed)
Lab Results  Component Value Date   CREATININE 1.08 08/31/2021   Stable overall, cont to avoid nephrotoxins

## 2021-09-08 NOTE — Assessment & Plan Note (Signed)
Lab Results  Component Value Date   LDLCALC 58 08/31/2021   Stable, pt to continue current low chol diet

## 2021-09-08 NOTE — Assessment & Plan Note (Signed)
Lab Results  Component Value Date   HGBA1C 6.5 08/31/2021   overcontrolled given age, pt to continue current medical treatment metformin, actos, but decrease the glimeparide 4mg  to HALF bid

## 2021-09-08 NOTE — Assessment & Plan Note (Signed)
Age and sex appropriate education and counseling updated with regular exercise and diet Referrals for preventative services - none needed Immunizations addressed - decilnes shingrix Smoking counseling  - none needed Evidence for depression or other mood disorder - none significant Most recent labs reviewed. I have personally reviewed and have noted: 1) the patient's medical and social history 2) The patient's current medications and supplements 3) The patient's height, weight, and BMI have been recorded in the chart

## 2021-09-08 NOTE — Assessment & Plan Note (Signed)
Lab Results  Component Value Date   VITAMINB12 1,044 (H) 08/31/2021   Mild increased, pt to decrease oral replacement - b12 1000 mcg to mon-wed-fri

## 2021-09-14 ENCOUNTER — Telehealth: Payer: Self-pay | Admitting: Internal Medicine

## 2021-09-14 NOTE — Telephone Encounter (Signed)
Beverly from [employer] called and said she called pt in regards to referral, and pt informed that she is being treated by someone else, did not schedule an appt.   ? ? ? ? ? ?

## 2021-09-18 ENCOUNTER — Other Ambulatory Visit (INDEPENDENT_AMBULATORY_CARE_PROVIDER_SITE_OTHER): Payer: Medicare HMO

## 2021-09-18 ENCOUNTER — Other Ambulatory Visit: Payer: Self-pay

## 2021-09-18 ENCOUNTER — Telehealth: Payer: Self-pay

## 2021-09-18 DIAGNOSIS — K7460 Unspecified cirrhosis of liver: Secondary | ICD-10-CM

## 2021-09-18 LAB — PROTIME-INR
INR: 1 ratio (ref 0.8–1.0)
Prothrombin Time: 11.4 s (ref 9.6–13.1)

## 2021-09-18 LAB — COMPREHENSIVE METABOLIC PANEL
ALT: 21 U/L (ref 0–35)
AST: 35 U/L (ref 0–37)
Albumin: 4 g/dL (ref 3.5–5.2)
Alkaline Phosphatase: 78 U/L (ref 39–117)
BUN: 32 mg/dL — ABNORMAL HIGH (ref 6–23)
CO2: 30 mEq/L (ref 19–32)
Calcium: 10.1 mg/dL (ref 8.4–10.5)
Chloride: 102 mEq/L (ref 96–112)
Creatinine, Ser: 0.99 mg/dL (ref 0.40–1.20)
GFR: 54.47 mL/min — ABNORMAL LOW (ref >60.00)
Glucose, Bld: 110 mg/dL — ABNORMAL HIGH (ref 70–99)
Potassium: 4.3 mEq/L (ref 3.5–5.1)
Sodium: 138 mEq/L (ref 135–145)
Total Bilirubin: 0.8 mg/dL (ref 0.2–1.2)
Total Protein: 6.6 g/dL (ref 6.0–8.3)

## 2021-09-18 LAB — CBC WITH DIFFERENTIAL/PLATELET
Basophils Absolute: 0 10*3/uL (ref 0.0–0.1)
Basophils Relative: 0.7 % (ref 0.0–3.0)
Eosinophils Absolute: 0.2 10*3/uL (ref 0.0–0.7)
Eosinophils Relative: 4 % (ref 0.0–5.0)
HCT: 32.1 % — ABNORMAL LOW (ref 36.0–46.0)
Hemoglobin: 10.8 g/dL — ABNORMAL LOW (ref 12.0–15.0)
Lymphocytes Relative: 19.5 % (ref 12.0–46.0)
Lymphs Abs: 0.9 10*3/uL (ref 0.7–4.0)
MCHC: 33.7 g/dL (ref 30.0–36.0)
MCV: 90.5 fl (ref 78.0–100.0)
Monocytes Absolute: 0.5 10*3/uL (ref 0.1–1.0)
Monocytes Relative: 10.6 % (ref 3.0–12.0)
Neutro Abs: 3 10*3/uL (ref 1.4–7.7)
Neutrophils Relative %: 65.2 % (ref 43.0–77.0)
Platelets: 151 10*3/uL (ref 150.0–400.0)
RBC: 3.54 Mil/uL — ABNORMAL LOW (ref 3.87–5.11)
RDW: 16.1 % — ABNORMAL HIGH (ref 11.5–15.5)
WBC: 4.7 10*3/uL (ref 4.0–10.5)

## 2021-09-18 NOTE — Telephone Encounter (Signed)
-----   Message from Timothy Lasso, RN sent at 03/21/2021 10:58 AM EDT ----- ?Aydien Majette,  ?See below, she needs OV in 6 months. ?Cbc, cmet, inr, afp a few days prior  ? ?Thanks  ? ? ?

## 2021-09-18 NOTE — Telephone Encounter (Signed)
Spoke with patient regarding appointment & reminder for labs. No further questions. ?

## 2021-09-20 ENCOUNTER — Other Ambulatory Visit: Payer: Self-pay | Admitting: Internal Medicine

## 2021-09-20 LAB — AFP TUMOR MARKER: AFP-Tumor Marker: 2 ng/mL

## 2021-09-21 ENCOUNTER — Telehealth: Payer: Self-pay | Admitting: Internal Medicine

## 2021-09-21 NOTE — Telephone Encounter (Signed)
PT visits today and leaves forms to be filled and mailed out! ?

## 2021-09-25 ENCOUNTER — Ambulatory Visit: Payer: Medicare HMO | Admitting: Gastroenterology

## 2021-09-25 ENCOUNTER — Encounter: Payer: Self-pay | Admitting: Gastroenterology

## 2021-09-25 VITALS — BP 138/74 | HR 74 | Ht 64.0 in | Wt 224.3 lb

## 2021-09-25 DIAGNOSIS — K7460 Unspecified cirrhosis of liver: Secondary | ICD-10-CM

## 2021-09-25 NOTE — Progress Notes (Signed)
Review of pertinent gastrointestinal problems: ?1. Cirrhosis: (Very possibly "burned out" autoimmune hepatitis ) CT abdomen and pelvis done on 03/12/2017 showed a nodular liver consistent with cirrhosis, there is a 9 mm lesion at the right hepatic dome too small to characterize, no biliary dilation, she did have a thick-walled gallbladder with multiple gallstones, no splenomegaly but noted splenic varices. ?Laboratory workup for cirrhosis September 2018: ANA was + 1:160 titer; anti-smooth muscle antibody +, hepatitis A total antibody negative, hepatitis B surface antigen negative, hepatitis B surface antibody negative, hepatitis C antibody negative, ceruloplasmin normal, iron studies normal, alpha-1 antitrypsin level normal, AMA negative ?Liver lesion on MRI 03/2017; peripherally enhancing T2 hyperintense and T1 hypointense lesion within segment 8 of the liver, measuring 1.8 cm, and indeterminant. (IR was unable biopsy this);  repeat MRI March 2019 showed that the lesion in question had completely disappeared, new issue was raised with "vascular abnormality" and radiology recommended follow-up MRI 3-6 months.  MRI 04/2018: Cirrhosis with "stable appearance of vascular shunt in the anterior right lobe.  No evidence of hepatic neoplasm."  [place] January 2021 "probable cirrhosis without focal mass". Cholelithiasis.  Ultrasound February 2022 "cirrhotic liver morphology.  No focal hepatic abnormality identified.  Cholelithiasis".  Ultrasound 03/2021 cirrhosis without masses or lesions, no ascites. ?AFP 09/2021 normal ?MELD-Na 6 (09/2020 labs) ?EGD 03/2017; no signs of portal hypertension ?Liver biopsy 04/2017: Cirrhosis with mild cholestasis.  Pathology felt possibly this was possibly due to an autoimmune process, ?2. Colonoscopy 03/2017 (for slightly elevated CEA and liver lesion on imaging): normal TI, normal colon ?3.  Abdominal pain, elevated liver tests, September 2018.  Gallstones in her gallbladder on imaging however pain  not completely consistent with biliary process.  EGD showed with deep duodenal bulb ulcer.  Biopsies of her stomach showed no H. Pylori.  Seems likely the ulcer was from unopposed meloxicam.  Her pains improved quickly with proton pump inhibitor, her liver tests normalized as well. ?4.  Pancreatic cystic lesion, body of pancreas.  MRI 04/2018 "stable 8 mm cystic lesion in the pancreatic body" no concerning morphologic features such as dilated main pancreatic duct or enhancing lesions.  ? ? ?HPI: ?This is a very pleasant 81 year old woman whom I last saw about 1 year ago. ? ?Her biggest issue is trouble with her back.  This limits her to walk around the house with a cane and using a walker when she is outside of the house.  She gets what sounds like a steroid injection of her low back every 3 months or so ? ?I last saw her here in the office almost exactly 1 year ago, March 2022 for routine cirrhosis follow-up. ? ?She has lost 14 pounds since her last office visit here, same scale here in our office ? ?She is not bothered by encephalopathy, GI bleeding, abdominal pains, edema ? ? ?ROS: complete GI ROS as described in HPI, all other review negative. ? ?Constitutional:  No unintentional weight loss ? ? ?Past Medical History:  ?Diagnosis Date  ? ALLERGIC RHINITIS 04/29/2007  ? Allergy   ? Basal cell carcinoma 03/24/2018  ? sclerosis- upper bridge of nose (MOHS)  ? BCC (basal cell carcinoma) 10/18/2019  ? right supra orbital region- Cx3 21f  ? Cataract   ? both eyes  ? DIABETES MELLITUS, TYPE II 06/23/2007  ? type 2  ? Elevated liver enzymes 03/2017  ? Elevated tumor markers   ? GERD (gastroesophageal reflux disease)   ? HIATAL HERNIA, HX OF 04/29/2007  ?  HYPERLIPIDEMIA 04/29/2007  ? diet controlled on CoQ10  ? HYPERTENSION 04/29/2007  ? HYPOKALEMIA 05/02/2009  ? HYPOTHYROIDISM 04/29/2007  ? LOW BACK PAIN 04/29/2007  ? Neuromuscular disorder ([place])   ? diabetic neuropathy in both legs  ? OSTEOARTHRITIS, KNEE, LEFT  06/23/2007  ? OSTEOPOROSIS NOS 05/12/2007  ? SPINAL STENOSIS, LUMBAR 04/29/2007  ? ? ?Past Surgical History:  ?Procedure Laterality Date  ? BACK SURGERY N/A 2014 x 2  ? T1 TO S 1   ? CATARACT EXTRACTION W/ INTRAOCULAR LENS  IMPLANT, BILATERAL Bilateral 05/2011  ? doing well  ? COLONOSCOPY WITH PROPOFOL N/A 04/10/2017  ? Procedure: COLONOSCOPY WITH PROPOFOL;  Surgeon: [name], MD;  Location: [facility];  Service: Endoscopy;  Laterality: N/A;  ? COLONSCOPY  2011  ? TOTAL KNEE ARTHROPLASTY  2006  ? right  ? TOTAL KNEE ARTHROPLASTY Left 2012  ? ? ?Current Outpatient Medications  ?Medication Instructions  ? aspirin 81 mg, Oral, Daily,    ? atenolol (TENORMIN) 50 MG tablet TAKE 1 TABLET BY MOUTH EVERY DAY  ? blood glucose meter kit and supplies KIT Dispense with patient  Insurance; Use up to four times daily as directed. E11.9  ? Blood Glucose Monitoring Suppl (ONE TOUCH ULTRA 2) w/Device KIT Use as directed up to 4 times per day E11.9  ? Cholecalciferol (VITAMIN D3) 50 MCG (2000 UT) TABS 1 tablet, Oral, Daily  ? Co-Enzyme Q-10 100 mg, Oral, Daily  ? cyanocobalamin 1,000 mcg, Oral, Every other day  ? fluticasone (FLONASE) 50 MCG/ACT nasal spray SPRAY 2 SPRAYS INTO EACH NOSTRIL EVERY DAY  ? glimepiride (AMARYL) 2 mg, Oral, 2 times daily  ? glucose blood (ONE TOUCH ULTRA TEST) test strip Use as instructed up to 4 times per day  E11.9  ? hydrochlorothiazide (HYDRODIURIL) 25 MG tablet TAKE 1 TABLET BY MOUTH EVERY DAY  ? Lancets MISC Use as directed up to four times per day E11.9  ? levothyroxine (SYNTHROID) 125 MCG tablet TAKE 1 TABLET BY MOUTH EVERY DAY BEFORE BREAKFAST  ? Magnesium 250 MG TABS 1 tablet, Oral, Daily  ? meclizine (ANTIVERT) 12.5 mg, Oral, 3 times daily PRN  ? meloxicam (MOBIC) 7.5 MG tablet TAKE 1 TABLET BY MOUTH EVERY DAY AS NEEDED FOR PAIN  ? metFORMIN (GLUCOPHAGE) 1000 MG tablet TAKE 1 TABLET BY MOUTH TWICE A DAY WITH MEALS  ? omeprazole (PRILOSEC) 40 mg, Oral, Daily  ? PFIZER-BIONT COVID-19  VAC-TRIS SUSP injection No dose, route, or frequency recorded.  ? pioglitazone (ACTOS) 30 MG tablet TAKE 1 TABLET BY MOUTH EVERY DAY  ? predniSONE (DELTASONE) 10 MG tablet 3 tabs by mouth per day for 3 days,2tabs per day for 3 days,1tab per day for 3 days  ? Red Yeast Rice Extract 1,200 mg, Oral, 2 times daily  ? tiZANidine (ZANAFLEX) 2 MG tablet TAKE 1 TABLET BY MOUTH AT BEDTIME AS NEEDED FOR MUSCLE SPASMS.  ? traMADol (ULTRAM) 50 mg, Oral, Every 8 hours PRN  ? ? ?Allergies as of 09/25/2021  ? (No Known Allergies)  ? ? ?Family History  ?Problem Relation Age of Onset  ? Coronary artery disease Other   ?     Female 1st degree relative  ? Diabetes Other   ?     1st degree relative  ? Colon polyps Other   ? Arthritis Mother   ? Hypertension Mother   ? Coronary artery disease Father   ? Arthritis Brother   ? Stroke Brother   ? Hyperlipidemia Brother   ?  COPD Brother   ? Colon cancer Neg Hx   ? Esophageal cancer Neg Hx   ? Rectal cancer Neg Hx   ? Stomach cancer Neg Hx   ? ? ?Social History  ? ?Socioeconomic History  ? Marital status: Divorced  ?  Spouse name: Not on file  ? Number of children: 1  ? Years of education: Not on file  ? Highest education level: Not on file  ?Occupational History  ? Occupation: Estate agent at [employer]  ?  Employer: RETIRED  ?Tobacco Use  ? Smoking status: Never  ? Smokeless tobacco: Never  ?Vaping Use  ? Vaping Use: Never used  ?Substance and Sexual Activity  ? Alcohol use: Yes  ?  Alcohol/week: 0.0 standard drinks  ?  Comment: One or two drinks in month  ? Drug use: No  ? Sexual activity: Never  ?Other Topics Concern  ? Not on file  ?Social History Narrative  ? Not on file  ? ?Social Determinants of Health  ? ?Financial Resource Strain: Low Risk   ? Difficulty of Paying Living Expenses: Not hard at all  ?Food Insecurity: No Food Insecurity  ? Worried About Charity fundraiser in the Last Year: Never true  ? Ran Out of Food in the Last Year: Never true  ?Transportation Needs: No Transportation Needs  ?  Lack of Transportation (Medical): No  ? Lack of Transportation (Non-Medical): No  ?Physical Activity: Inactive  ? Days of Exercise per Week: 0 days  ? Minutes of Exercise per Session: 0 min  ?Stress: No Stress Con

## 2021-09-25 NOTE — Patient Instructions (Signed)
If you are age 79 or older, your body mass index should be between 23-30. Your Body mass index is 38.5 kg/m?Marland Kitchen If this is out of the aforementioned range listed, please consider follow up with your Primary Care Provider. ?________________________________________________________ ? ?The [facility] providers would like to encourage you to use [facility] to communicate with providers for non-urgent requests or questions.  Due to long hold times on the telephone, sending your provider a message by [facility] may be a faster and more efficient way to get a response.  Please allow 48 business hours for a response.  Please remember that this is for non-urgent requests.  ?_______________________________________________________ ? ?You have been scheduled for an abdominal ultrasound at [facility] (1st floor of hospital) on 09-28-21 at 10am. Please arrive 15 minutes prior to your appointment for registration. Make certain not to have anything to eat or drink after midnight prior to your appointment. Should you need to reschedule your appointment, please contact radiology at [phone]. This test typically takes about 30 minutes to perform. ? ?Due to recent changes in healthcare laws, you may see the results of your imaging and laboratory studies on MyChart before your provider has had a chance to review them.  We understand that in some cases there may be results that are confusing or concerning to you. Not all laboratory results come back in the same time frame and the provider may be waiting for multiple results in order to interpret others.  Please give [place] 48 hours in order for your provider to thoroughly review all the results before contacting the office for clarification of your results.  ? ?You will need a follow up appointment in 1 year.  We will contact you to schedule this appointment. ? ?Thank you for entrusting me with your care and choosing [facility]. ? ?Dr Ardis Hughs ? ?

## 2021-09-28 ENCOUNTER — Other Ambulatory Visit: Payer: Self-pay

## 2021-09-28 ENCOUNTER — Ambulatory Visit (HOSPITAL_COMMUNITY)
Admission: RE | Admit: 2021-09-28 | Discharge: 2021-09-28 | Disposition: A | Discharge status: Home / Self Care | Payer: Medicare HMO | Source: Ambulatory Visit | Attending: Gastroenterology | Admitting: Gastroenterology

## 2021-09-28 DIAGNOSIS — K7460 Unspecified cirrhosis of liver: Secondary | ICD-10-CM | POA: Insufficient documentation

## 2021-10-08 ENCOUNTER — Other Ambulatory Visit: Payer: Self-pay | Admitting: Internal Medicine

## 2021-10-08 DIAGNOSIS — K269 Duodenal ulcer, unspecified as acute or chronic, without hemorrhage or perforation: Secondary | ICD-10-CM

## 2021-10-08 DIAGNOSIS — R1013 Epigastric pain: Secondary | ICD-10-CM

## 2021-10-08 DIAGNOSIS — K2970 Gastritis, unspecified, without bleeding: Secondary | ICD-10-CM

## 2021-10-08 NOTE — Telephone Encounter (Signed)
Please refill as per office routine med refill policy (all routine meds to be refilled for 3 mo or monthly (per pt preference) up to one year from last visit, then month to month grace period for 3 mo, then further med refills will have to be denied) ? ?

## 2021-10-08 NOTE — Telephone Encounter (Signed)
Notified patient that paperwork has been signed and mailed ?

## 2021-10-30 DIAGNOSIS — G5703 Lesion of sciatic nerve, bilateral lower limbs: Secondary | ICD-10-CM | POA: Diagnosis not present

## 2021-11-08 DIAGNOSIS — M13831 Other specified arthritis, right wrist: Secondary | ICD-10-CM | POA: Diagnosis not present

## 2021-11-08 DIAGNOSIS — M13841 Other specified arthritis, right hand: Secondary | ICD-10-CM | POA: Diagnosis not present

## 2021-11-08 DIAGNOSIS — G5601 Carpal tunnel syndrome, right upper limb: Secondary | ICD-10-CM | POA: Diagnosis not present

## 2021-11-08 DIAGNOSIS — M79641 Pain in right hand: Secondary | ICD-10-CM | POA: Diagnosis not present

## 2021-11-08 DIAGNOSIS — M19042 Primary osteoarthritis, left hand: Secondary | ICD-10-CM | POA: Insufficient documentation

## 2021-11-22 ENCOUNTER — Other Ambulatory Visit: Payer: Self-pay | Admitting: Internal Medicine

## 2021-11-22 DIAGNOSIS — Z1231 Encounter for screening mammogram for malignant neoplasm of breast: Secondary | ICD-10-CM

## 2021-11-28 DIAGNOSIS — K219 Gastro-esophageal reflux disease without esophagitis: Secondary | ICD-10-CM | POA: Diagnosis not present

## 2021-11-28 DIAGNOSIS — Z7982 Long term (current) use of aspirin: Secondary | ICD-10-CM | POA: Diagnosis not present

## 2021-11-28 DIAGNOSIS — M1990 Unspecified osteoarthritis, unspecified site: Secondary | ICD-10-CM | POA: Diagnosis not present

## 2021-11-28 DIAGNOSIS — Z6837 Body mass index (BMI) 37.0-37.9, adult: Secondary | ICD-10-CM | POA: Diagnosis not present

## 2021-11-28 DIAGNOSIS — Z791 Long term (current) use of non-steroidal anti-inflammatories (NSAID): Secondary | ICD-10-CM | POA: Diagnosis not present

## 2021-11-28 DIAGNOSIS — I499 Cardiac arrhythmia, unspecified: Secondary | ICD-10-CM | POA: Diagnosis not present

## 2021-11-28 DIAGNOSIS — E039 Hypothyroidism, unspecified: Secondary | ICD-10-CM | POA: Diagnosis not present

## 2021-11-28 DIAGNOSIS — R32 Unspecified urinary incontinence: Secondary | ICD-10-CM | POA: Diagnosis not present

## 2021-11-28 DIAGNOSIS — R609 Edema, unspecified: Secondary | ICD-10-CM | POA: Diagnosis not present

## 2021-11-28 DIAGNOSIS — I10 Essential (primary) hypertension: Secondary | ICD-10-CM | POA: Diagnosis not present

## 2021-11-28 DIAGNOSIS — E1122 Type 2 diabetes mellitus with diabetic chronic kidney disease: Secondary | ICD-10-CM | POA: Diagnosis not present

## 2021-12-04 ENCOUNTER — Ambulatory Visit
Admission: RE | Admit: 2021-12-04 | Discharge: 2021-12-04 | Disposition: A | Discharge status: Home / Self Care | Payer: Medicare HMO | Source: Ambulatory Visit | Attending: Internal Medicine | Admitting: Internal Medicine

## 2021-12-04 DIAGNOSIS — Z1231 Encounter for screening mammogram for malignant neoplasm of breast: Secondary | ICD-10-CM | POA: Diagnosis not present

## 2021-12-06 ENCOUNTER — Encounter: Payer: Self-pay | Admitting: Internal Medicine

## 2022-01-29 DIAGNOSIS — E119 Type 2 diabetes mellitus without complications: Secondary | ICD-10-CM | POA: Diagnosis not present

## 2022-01-29 DIAGNOSIS — G5703 Lesion of sciatic nerve, bilateral lower limbs: Secondary | ICD-10-CM | POA: Diagnosis not present

## 2022-01-29 LAB — HM DIABETES EYE EXAM

## 2022-02-01 ENCOUNTER — Other Ambulatory Visit: Payer: Self-pay | Admitting: Internal Medicine

## 2022-02-08 ENCOUNTER — Other Ambulatory Visit: Payer: Self-pay | Admitting: Internal Medicine

## 2022-02-08 NOTE — Telephone Encounter (Signed)
Please refill as per office routine med refill policy (all routine meds to be refilled for 3 mo or monthly (per pt preference) up to one year from last visit, then month to month grace period for 3 mo, then further med refills will have to be denied) ? ?

## 2022-02-25 ENCOUNTER — Telehealth: Payer: Self-pay | Admitting: Internal Medicine

## 2022-02-25 DIAGNOSIS — E538 Deficiency of other specified B group vitamins: Secondary | ICD-10-CM

## 2022-02-25 DIAGNOSIS — E559 Vitamin D deficiency, unspecified: Secondary | ICD-10-CM

## 2022-02-25 DIAGNOSIS — E1165 Type 2 diabetes mellitus with hyperglycemia: Secondary | ICD-10-CM

## 2022-02-25 NOTE — Telephone Encounter (Signed)
Patient would like to know if she needs to have labs before her appointment on 03/12/2022.  Please let patient know and put orders in if necessary.  There are no orders in at present.

## 2022-02-25 NOTE — Telephone Encounter (Signed)
Should patient have labs done prior to her upcoming visit?

## 2022-02-25 NOTE — Telephone Encounter (Signed)
Ok labs ordered 

## 2022-02-26 ENCOUNTER — Ambulatory Visit (INDEPENDENT_AMBULATORY_CARE_PROVIDER_SITE_OTHER): Payer: Medicare HMO

## 2022-02-26 ENCOUNTER — Ambulatory Visit (INDEPENDENT_AMBULATORY_CARE_PROVIDER_SITE_OTHER): Payer: Medicare HMO | Admitting: Family Medicine

## 2022-02-26 ENCOUNTER — Encounter: Payer: Self-pay | Admitting: Family Medicine

## 2022-02-26 VITALS — BP 136/76 | HR 65 | Temp 97.5°F | Ht 64.0 in | Wt 215.0 lb

## 2022-02-26 DIAGNOSIS — E1165 Type 2 diabetes mellitus with hyperglycemia: Secondary | ICD-10-CM

## 2022-02-26 DIAGNOSIS — E559 Vitamin D deficiency, unspecified: Secondary | ICD-10-CM

## 2022-02-26 DIAGNOSIS — E538 Deficiency of other specified B group vitamins: Secondary | ICD-10-CM | POA: Diagnosis not present

## 2022-02-26 DIAGNOSIS — M545 Low back pain, unspecified: Secondary | ICD-10-CM

## 2022-02-26 LAB — LIPID PANEL
Cholesterol: 152 mg/dL (ref 0–200)
HDL: 76 mg/dL (ref >39.00)
LDL Cholesterol: 63 mg/dL (ref 0–99)
NonHDL: 76.19
Total CHOL/HDL Ratio: 2
Triglycerides: 67 mg/dL (ref 0.0–149.0)
VLDL: 13.4 mg/dL (ref 0.0–40.0)

## 2022-02-26 LAB — URINALYSIS, ROUTINE W REFLEX MICROSCOPIC
Bilirubin Urine: NEGATIVE
Hgb urine dipstick: NEGATIVE
Leukocytes,Ua: NEGATIVE
Nitrite: NEGATIVE
RBC / HPF: NONE SEEN (ref 0–?)
Specific Gravity, Urine: 1.02 (ref 1.000–1.030)
Total Protein, Urine: NEGATIVE
Urine Glucose: NEGATIVE
Urobilinogen, UA: 0.2 (ref 0.0–1.0)
pH: 6 (ref 5.0–8.0)

## 2022-02-26 LAB — CBC WITH DIFFERENTIAL/PLATELET
Basophils Absolute: 0 10*3/uL (ref 0.0–0.1)
Basophils Relative: 0.5 % (ref 0.0–3.0)
Eosinophils Absolute: 0.1 10*3/uL (ref 0.0–0.7)
Eosinophils Relative: 2.7 % (ref 0.0–5.0)
HCT: 33.7 % — ABNORMAL LOW (ref 36.0–46.0)
Hemoglobin: 11 g/dL — ABNORMAL LOW (ref 12.0–15.0)
Lymphocytes Relative: 17.6 % (ref 12.0–46.0)
Lymphs Abs: 0.9 10*3/uL (ref 0.7–4.0)
MCHC: 32.5 g/dL (ref 30.0–36.0)
MCV: 90.7 fl (ref 78.0–100.0)
Monocytes Absolute: 0.6 10*3/uL (ref 0.1–1.0)
Monocytes Relative: 11.2 % (ref 3.0–12.0)
Neutro Abs: 3.3 10*3/uL (ref 1.4–7.7)
Neutrophils Relative %: 68 % (ref 43.0–77.0)
Platelets: 127 10*3/uL — ABNORMAL LOW (ref 150.0–400.0)
RBC: 3.72 Mil/uL — ABNORMAL LOW (ref 3.87–5.11)
RDW: 16.6 % — ABNORMAL HIGH (ref 11.5–15.5)
WBC: 4.9 10*3/uL (ref 4.0–10.5)

## 2022-02-26 LAB — BASIC METABOLIC PANEL
BUN: 36 mg/dL — ABNORMAL HIGH (ref 6–23)
CO2: 31 mEq/L (ref 19–32)
Calcium: 10.3 mg/dL (ref 8.4–10.5)
Chloride: 103 mEq/L (ref 96–112)
Creatinine, Ser: 0.99 mg/dL (ref 0.40–1.20)
GFR: 54.3 mL/min — ABNORMAL LOW (ref >60.00)
Glucose, Bld: 62 mg/dL — ABNORMAL LOW (ref 70–99)
Potassium: 4.8 mEq/L (ref 3.5–5.1)
Sodium: 140 mEq/L (ref 135–145)

## 2022-02-26 LAB — HEMOGLOBIN A1C: Hgb A1c MFr Bld: 6.4 % (ref 4.6–6.5)

## 2022-02-26 LAB — POCT URINALYSIS DIPSTICK
Bilirubin, UA: NEGATIVE
Blood, UA: NEGATIVE
Glucose, UA: NEGATIVE
Ketones, UA: NEGATIVE
Leukocytes, UA: NEGATIVE
Nitrite, UA: NEGATIVE
Protein, UA: POSITIVE — AB
Spec Grav, UA: >=1.03 — AB (ref 1.010–1.025)
Urobilinogen, UA: 0.2 E.U./dL
pH, UA: 6 (ref 5.0–8.0)

## 2022-02-26 LAB — HEPATIC FUNCTION PANEL
ALT: 23 U/L (ref 0–35)
AST: 31 U/L (ref 0–37)
Albumin: 4.1 g/dL (ref 3.5–5.2)
Alkaline Phosphatase: 81 U/L (ref 39–117)
Bilirubin, Direct: 0.2 mg/dL (ref 0.0–0.3)
Total Bilirubin: 0.7 mg/dL (ref 0.2–1.2)
Total Protein: 6.7 g/dL (ref 6.0–8.3)

## 2022-02-26 LAB — VITAMIN D 25 HYDROXY (VIT D DEFICIENCY, FRACTURES): VITD: 41.21 ng/mL (ref 30.00–100.00)

## 2022-02-26 LAB — MICROALBUMIN / CREATININE URINE RATIO
Creatinine,U: 130.9 mg/dL
Microalb Creat Ratio: 5.1 mg/g (ref 0.0–30.0)
Microalb, Ur: 6.7 mg/dL — ABNORMAL HIGH (ref 0.0–1.9)

## 2022-02-26 LAB — VITAMIN B12: Vitamin B-12: 837 pg/mL (ref 211–911)

## 2022-02-26 LAB — TSH: TSH: 3.19 u[IU]/mL (ref 0.35–5.50)

## 2022-02-26 NOTE — Telephone Encounter (Signed)
Called ot no answer LMOM MD response.Marland KitchenJohny Keller

## 2022-02-26 NOTE — Assessment & Plan Note (Signed)
Apparently she is getting labs today which were ordered by her PCP

## 2022-02-26 NOTE — Patient Instructions (Addendum)
Please go downstairs for an X ray of your low back before you leave today.   Continue with your regular pain medications. You may want to try a Tramadol before bed time for this pain for the next week or so and see if this helps.   You can also use a heating pad, ice pack and topical pain medication such as Salon Pas with lidocaine or something similar over the counter.   Follow up with Dr. Jenny Reichmann if you are not improving or if any new symptoms.

## 2022-02-26 NOTE — Progress Notes (Signed)
Subjective:     Patient ID: Kelsey Keller, female    DOB: 1940/09/24, 81 y.o.   MRN: [id number]  Chief Complaint  Patient presents with   Flank Pain    Pain around her kidneys for about 3 days. Not able to sleep on back or right side due to pain. Dull pain when sitting, achy pain worse when lays down. Denies any urinary sx.     HPI Patient is in today for left low back pain for the past 4 -5 days. Pain is worse when laying down and moving around in the bed.  No known injury.  She has been pulling weeds from her garden recently.  Takes Tramadol during the day. She also takes meloxicam daily and tizanidine prn.   She sees neurosurgery for piriformis syndrome.  Reports having history of chronic back pain and arthritis.  History of back surgery and has hardware in her spine.  Denies fever, chills, dizziness, chest pain, palpitations, shortness of breath, abdominal pain, N/V/D, urinary symptoms, LE edema.    Health Maintenance Due  Topic Date Due   Zoster Vaccines- Shingrix (1 of 2) Never done   COVID-19 Vaccine (5 - Pfizer risk series) 07/11/2021   INFLUENZA VACCINE  02/12/2022    Past Medical History:  Diagnosis Date   ALLERGIC RHINITIS 04/29/2007   Allergy    Basal cell carcinoma 03/24/2018   sclerosis- upper bridge of nose (MOHS)   BCC (basal cell carcinoma) 10/18/2019   right supra orbital region- Cx3 67f   Cataract    both eyes   DIABETES MELLITUS, TYPE II 06/23/2007   type 2   Elevated liver enzymes 03/2017   Elevated tumor markers    GERD (gastroesophageal reflux disease)    HIATAL HERNIA, HX OF 04/29/2007   HYPERLIPIDEMIA 04/29/2007   diet controlled on CoQ10   HYPERTENSION 04/29/2007   HYPOKALEMIA 05/02/2009   HYPOTHYROIDISM 04/29/2007   LOW BACK PAIN 04/29/2007   [place]    diabetic neuropathy in both legs   OSTEOARTHRITIS, KNEE, LEFT 06/23/2007   OSTEOPOROSIS NOS 05/12/2007   SPINAL STENOSIS, LUMBAR 04/29/2007    Past Surgical  History:  Procedure Laterality Date   BACK SURGERY N/A 2014 x 2   T1 TO S 1    CATARACT EXTRACTION W/ INTRAOCULAR LENS  IMPLANT, BILATERAL Bilateral 05/2011   doing well   COLONOSCOPY WITH PROPOFOL N/A 04/10/2017   Procedure: COLONOSCOPY WITH PROPOFOL;  Surgeon: JMilus Banister MD;  Location: [facility];  Service: Endoscopy;  Laterality: N/A;   COLONSCOPY  2011   TOTAL KNEE ARTHROPLASTY  2006   right   TOTAL KNEE ARTHROPLASTY Left 2012    Family History  Problem Relation Age of Onset   Coronary artery disease Other        Female 1st degree relative   Diabetes Other        1st degree relative   Colon polyps Other    Arthritis Mother    Hypertension Mother    Coronary artery disease Father    Arthritis Brother    Stroke Brother    Hyperlipidemia Brother    COPD Brother    Colon cancer Neg Hx    Esophageal cancer Neg Hx    Rectal cancer Neg Hx    Stomach cancer Neg Hx     Social History   Socioeconomic History   Marital status: Divorced    Spouse name: Not on file   Number of children: 1  Years of education: Not on file   Highest education level: Not on file  Occupational History   Occupation: teller at bank    Employer: RETIRED  Tobacco Use   Smoking status: Never   Smokeless tobacco: Never  Vaping Use   Vaping Use: Never used  Substance and Sexual Activity   Alcohol use: Yes    Alcohol/week: 0.0 standard drinks of alcohol    Comment: One or two drinks in month   Drug use: No   Sexual activity: Never  Other Topics Concern   Not on file  Social History Narrative   Not on file   Social Determinants of Health   Financial Resource Strain: Low Risk  (04/26/2021)   Overall Financial Resource Strain (CARDIA)    Difficulty of Paying Living Expenses: Not hard at all  Food Insecurity: No Food Insecurity (04/26/2021)   Hunger Vital Sign    Worried About Running Out of Food in the Last Year: Never true    Ran Out of Food in the Last Year: Never true   Transportation Needs: No Transportation Needs (04/26/2021)   PRAPARE - Hydrologist (Medical): No    Lack of Transportation (Non-Medical): No  Physical Activity: Inactive (04/26/2021)   Exercise Vital Sign    Days of Exercise per Week: 0 days    Minutes of Exercise per Session: 0 min  Stress: No Stress Concern Present (04/26/2021)   [place]    Feeling of Stress : Not at all  Social Connections: Moderately Integrated (04/26/2021)   Social Connection and Isolation Panel [NHANES]    Frequency of Communication with Friends and Family: More than three times a week    Frequency of Social Gatherings with Friends and Family: More than three times a week    Attends Religious Services: More than 4 times per year    Active Member of [employer] or Organizations: Yes    Attends Archivist Meetings: More than 4 times per year    Marital Status: Widowed  Intimate Partner Violence: Not At Risk (04/26/2021)   Humiliation, Afraid, Rape, and Kick questionnaire    Fear of Current or Ex-Partner: No    Emotionally Abused: No    Physically Abused: No    Sexually Abused: No    Outpatient Medications Prior to Visit  Medication Sig Dispense Refill   aspirin 81 MG EC tablet Take 81 mg by mouth daily.     atenolol (TENORMIN) 50 MG tablet TAKE 1 TABLET BY MOUTH EVERY DAY 90 tablet 1   blood glucose meter kit and supplies KIT Dispense with patient  Insurance; Use up to four times daily as directed. E11.9 1 each 0   Blood Glucose Monitoring Suppl (ONE TOUCH ULTRA 2) w/Device KIT Use as directed up to 4 times per day E11.9 1 each 0   Cholecalciferol (VITAMIN D3) 50 MCG (2000 UT) TABS Take 1 tablet by mouth daily.     Co-Enzyme Q-10 100 MG CAPS Take 100 mg by mouth daily.     cyanocobalamin 1000 MCG tablet Take 1,000 mcg by mouth every other day.     fluticasone (FLONASE) 50 MCG/ACT nasal spray SPRAY 2 SPRAYS  INTO EACH NOSTRIL EVERY DAY 48 mL 3   glimepiride (AMARYL) 4 MG tablet Take 0.5 tablets (2 mg total) by mouth 2 (two) times daily. 90 tablet 3   glucose blood (ONE TOUCH ULTRA TEST) test strip Use as instructed  up to 4 times per day  E11.9 100 each 12   hydrochlorothiazide (HYDRODIURIL) 25 MG tablet TAKE 1 TABLET BY MOUTH EVERY DAY 90 tablet 3   Lancets MISC Use as directed up to four times per day E11.9 200 each 11   levothyroxine (SYNTHROID) 125 MCG tablet TAKE 1 TABLET BY MOUTH EVERY DAY BEFORE BREAKFAST 90 tablet 1   Magnesium 250 MG TABS Take 1 tablet by mouth daily.     meclizine (ANTIVERT) 12.5 MG tablet Take 1 tablet (12.5 mg total) by mouth 3 (three) times daily as needed for dizziness. 90 tablet 1   meloxicam (MOBIC) 7.5 MG tablet TAKE 1 TABLET BY MOUTH EVERY DAY AS NEEDED FOR PAIN 90 tablet 1   metFORMIN (GLUCOPHAGE) 1000 MG tablet TAKE 1 TABLET BY MOUTH TWICE A DAY WITH MEALS 180 tablet 1   omeprazole (PRILOSEC) 40 MG capsule TAKE 1 CAPSULE (40 MG TOTAL) BY MOUTH DAILY. 90 capsule 3   PFIZER-BIONT COVID-19 VAC-TRIS SUSP injection      pioglitazone (ACTOS) 30 MG tablet TAKE 1 TABLET BY MOUTH EVERY DAY 90 tablet 3   Red Yeast Rice Extract 600 MG CAPS Take 1,200 mg by mouth 2 (two) times daily.     tiZANidine (ZANAFLEX) 2 MG tablet TAKE 1 TABLET BY MOUTH AT BEDTIME AS NEEDED FOR MUSCLE SPASMS. 90 tablet 1   traMADol (ULTRAM) 50 MG tablet Take 1 tablet (50 mg total) by mouth every 8 (eight) hours as needed. 90 tablet 5   predniSONE (DELTASONE) 10 MG tablet 3 tabs by mouth per day for 3 days,2tabs per day for 3 days,1tab per day for 3 days 18 tablet 0   No facility-administered medications prior to visit.    No Known Allergies  ROS     Objective:    Physical Exam Constitutional:      General: She is not in acute distress.    Appearance: She is not ill-appearing.  Cardiovascular:     Rate and Rhythm: Normal rate and regular rhythm.  Pulmonary:     Effort: Pulmonary effort is  normal.     Breath sounds: Normal breath sounds.  Abdominal:     Tenderness: There is no right CVA tenderness or left CVA tenderness.  Musculoskeletal:     Cervical back: Normal range of motion and neck supple. No tenderness.     Thoracic back: Normal.     Lumbar back: Tenderness present. Decreased range of motion. Negative right straight leg raise test and negative left straight leg raise test.     Comments: Midline tenderness to lumbar spine.  Bilateral lower extremities are neurovascularly intact.  Ambulating at baseline   Skin:    General: Skin is warm and dry.  Neurological:     General: No focal deficit present.     Mental Status: She is alert and oriented to person, place, and time.     Cranial Nerves: No cranial nerve deficit.     Sensory: No sensory deficit.     Motor: No weakness.     Deep Tendon Reflexes: Reflexes normal.  Psychiatric:        Mood and Affect: Mood normal.        Behavior: Behavior normal.        Thought Content: Thought content normal.     BP 136/76 (BP Location: Left Arm, Patient Position: Sitting, Cuff Size: Large)   Pulse 65   Temp (!) 97.5 F (36.4 C) (Temporal)   Ht '5\' 4"'  (1.626 m)  Wt 215 lb (97.5 kg)   SpO2 99%   BMI 36.90 kg/m  Wt Readings from Last 3 Encounters:  02/26/22 215 lb (97.5 kg)  09/25/21 224 lb 5 oz (101.7 kg)  09/06/21 226 lb (102.5 kg)       Assessment & Plan:   Problem List Items Addressed This Visit       Endocrine   Diabetes ([place])    Apparently she is getting labs today which were ordered by her PCP        Other   B12 deficiency    Labs ordered by PCP      LOW BACK PAIN - Primary    Extensive history of back pain and surgery.  Followed by neurosurgery.  Recommend she continue her current pain medication and add a dose of tramadol in the evening since she is only taking this once daily.  Discussed topical therapies.  Lumbar spine x-ray ordered.  Follow-up pending results.      Relevant Orders   POCT  urinalysis dipstick (Completed)   DG Lumbar Spine Complete   Vitamin D deficiency    I have discontinued Genee T. Bounds "Peggy"'s predniSONE. I am also having her maintain her aspirin EC, Red Yeast Rice Extract, Co-Enzyme Q-10, blood glucose meter kit and supplies, ONE TOUCH ULTRA 2, Lancets, meclizine, cyanocobalamin, Vitamin D3, Magnesium, fluticasone, Pfizer-BioNT COVID-19 Vac-TriS, glucose blood, levothyroxine, atenolol, glimepiride, traMADol, tiZANidine, hydrochlorothiazide, omeprazole, meloxicam, metFORMIN, and pioglitazone.  No orders of the defined types were placed in this encounter.

## 2022-02-26 NOTE — Assessment & Plan Note (Signed)
Extensive history of back pain and surgery.  Followed by neurosurgery.  Recommend she continue her current pain medication and add a dose of tramadol in the evening since she is only taking this once daily.  Discussed topical therapies.  Lumbar spine x-ray ordered.  Follow-up pending results.

## 2022-02-26 NOTE — Assessment & Plan Note (Signed)
Labs ordered by PCP.

## 2022-03-03 ENCOUNTER — Other Ambulatory Visit: Payer: Self-pay | Admitting: Internal Medicine

## 2022-03-03 NOTE — Telephone Encounter (Signed)
Please refill as per office routine med refill policy (all routine meds to be refilled for 3 mo or monthly (per pt preference) up to one year from last visit, then month to month grace period for 3 mo, then further med refills will have to be denied) ? ?

## 2022-03-11 ENCOUNTER — Ambulatory Visit: Payer: Medicare HMO | Admitting: Internal Medicine

## 2022-03-11 DIAGNOSIS — Z0100 Encounter for examination of eyes and vision without abnormal findings: Secondary | ICD-10-CM | POA: Diagnosis not present

## 2022-03-12 ENCOUNTER — Ambulatory Visit (INDEPENDENT_AMBULATORY_CARE_PROVIDER_SITE_OTHER): Payer: Medicare HMO | Admitting: Internal Medicine

## 2022-03-12 ENCOUNTER — Encounter: Payer: Self-pay | Admitting: Internal Medicine

## 2022-03-12 VITALS — BP 140/72 | HR 64 | Temp 97.8°F | Ht 68.0 in | Wt 214.0 lb

## 2022-03-12 DIAGNOSIS — I10 Essential (primary) hypertension: Secondary | ICD-10-CM | POA: Diagnosis not present

## 2022-03-12 DIAGNOSIS — E559 Vitamin D deficiency, unspecified: Secondary | ICD-10-CM

## 2022-03-12 DIAGNOSIS — M5416 Radiculopathy, lumbar region: Secondary | ICD-10-CM

## 2022-03-12 DIAGNOSIS — E538 Deficiency of other specified B group vitamins: Secondary | ICD-10-CM

## 2022-03-12 DIAGNOSIS — E1165 Type 2 diabetes mellitus with hyperglycemia: Secondary | ICD-10-CM

## 2022-03-12 NOTE — Progress Notes (Unsigned)
Patient ID: Kelsey Keller, female   DOB: May 11, 1941, 81 y.o.   MRN: [id number]        Chief Complaint: follow up left back and leg pain       HPI:  Kelsey Keller is a 81 y.o. female here with c/o 6 wks onset mod to severe left lower back pain with radiation to the LLE with numbness, mild weakness.  Still losing wt on purpose,   Did not tolerate gabapentin before, made her wobbly and some memory issue, but not sure of the dose.  Does not want further surgury if possible, but is asking for MRI and NS referral, as last NS has retired.  Pain better to stand and walk, worse to lie on the left side.  Pt denies chest pain, increased sob or doe, wheezing, orthopnea, PND, increased LE swelling, palpitations, dizziness or syncope.   Pt denies polydipsia, polyuria, but has had several lower blood sugars with mild symptoms recently.  Wt Readings from Last 3 Encounters:  03/12/22 214 lb (97.1 kg)  02/26/22 215 lb (97.5 kg)  09/25/21 224 lb 5 oz (101.7 kg)   BP Readings from Last 3 Encounters:  03/12/22 (!) 140/72  02/26/22 136/76  09/25/21 138/74         Past Medical History:  Diagnosis Date   ALLERGIC RHINITIS 04/29/2007   Allergy    Basal cell carcinoma 03/24/2018   sclerosis- upper bridge of nose (MOHS)   BCC (basal cell carcinoma) 10/18/2019   right supra orbital region- Cx3 66f   Cataract    both eyes   DIABETES MELLITUS, TYPE II 06/23/2007   type 2   Elevated liver enzymes 03/2017   Elevated tumor markers    GERD (gastroesophageal reflux disease)    HIATAL HERNIA, HX OF 04/29/2007   HYPERLIPIDEMIA 04/29/2007   diet controlled on CoQ10   HYPERTENSION 04/29/2007   HYPOKALEMIA 05/02/2009   HYPOTHYROIDISM 04/29/2007   LOW BACK PAIN 04/29/2007   Neuromuscular disorder ([place]    diabetic neuropathy in both legs   OSTEOARTHRITIS, KNEE, LEFT 06/23/2007   OSTEOPOROSIS NOS 05/12/2007   SPINAL STENOSIS, LUMBAR 04/29/2007   Past Surgical History:  Procedure Laterality Date   BACK  SURGERY N/A 2014 x 2   T1 TO S 1    CATARACT EXTRACTION W/ INTRAOCULAR LENS  IMPLANT, BILATERAL Bilateral 05/2011   doing well   COLONOSCOPY WITH PROPOFOL N/A 04/10/2017   Procedure: COLONOSCOPY WITH PROPOFOL;  Surgeon: JMilus Banister MD;  Location: [facility];  Service: Endoscopy;  Laterality: N/A;   COLONSCOPY  2011   TOTAL KNEE ARTHROPLASTY  2006   right   TOTAL KNEE ARTHROPLASTY Left 2012    reports that she has never smoked. She has never used smokeless tobacco. She reports current alcohol use. She reports that she does not use drugs. family history includes Arthritis in her brother and mother; COPD in her brother; Colon polyps in an other family member; Coronary artery disease in her father and another family member; Diabetes in an other family member; Hyperlipidemia in her brother; Hypertension in her mother; Stroke in her brother. No Known Allergies Current Outpatient Medications on File Prior to Visit  Medication Sig Dispense Refill   aspirin 81 MG EC tablet Take 81 mg by mouth daily.     atenolol (TENORMIN) 50 MG tablet TAKE 1 TABLET BY MOUTH EVERY DAY 90 tablet 1   blood glucose meter kit and supplies KIT Dispense with patient  Insurance; Use up to  four times daily as directed. E11.9 1 each 0   Blood Glucose Monitoring Suppl (ONE TOUCH ULTRA 2) w/Device KIT Use as directed up to 4 times per day E11.9 1 each 0   Cholecalciferol (VITAMIN D3) 50 MCG (2000 UT) TABS Take 1 tablet by mouth daily.     Co-Enzyme Q-10 100 MG CAPS Take 100 mg by mouth daily.     cyanocobalamin 1000 MCG tablet Take 1,000 mcg by mouth every other day.     fluticasone (FLONASE) 50 MCG/ACT nasal spray SPRAY 2 SPRAYS INTO EACH NOSTRIL EVERY DAY 48 mL 3   glucose blood (ONE TOUCH ULTRA TEST) test strip Use as instructed up to 4 times per day  E11.9 100 each 12   hydrochlorothiazide (HYDRODIURIL) 25 MG tablet TAKE 1 TABLET BY MOUTH EVERY DAY 90 tablet 3   Lancets MISC Use as directed up to four times per day  E11.9 200 each 11   levothyroxine (SYNTHROID) 125 MCG tablet TAKE 1 TABLET BY MOUTH EVERY DAY BEFORE BREAKFAST 90 tablet 1   Magnesium 250 MG TABS Take 1 tablet by mouth daily.     meclizine (ANTIVERT) 12.5 MG tablet Take 1 tablet (12.5 mg total) by mouth 3 (three) times daily as needed for dizziness. 90 tablet 1   meloxicam (MOBIC) 7.5 MG tablet TAKE 1 TABLET BY MOUTH EVERY DAY AS NEEDED FOR PAIN 90 tablet 1   metFORMIN (GLUCOPHAGE) 1000 MG tablet TAKE 1 TABLET BY MOUTH TWICE A DAY WITH MEALS 180 tablet 1   omeprazole (PRILOSEC) 40 MG capsule TAKE 1 CAPSULE (40 MG TOTAL) BY MOUTH DAILY. 90 capsule 3   PFIZER-BIONT COVID-19 VAC-TRIS SUSP injection      pioglitazone (ACTOS) 30 MG tablet TAKE 1 TABLET BY MOUTH EVERY DAY 90 tablet 3   Red Yeast Rice Extract 600 MG CAPS Take 1,200 mg by mouth 2 (two) times daily.     tiZANidine (ZANAFLEX) 2 MG tablet TAKE 1 TABLET BY MOUTH AT BEDTIME AS NEEDED FOR MUSCLE SPASMS. 90 tablet 1   traMADol (ULTRAM) 50 MG tablet Take 1 tablet (50 mg total) by mouth every 8 (eight) hours as needed. 90 tablet 5   No current facility-administered medications on file prior to visit.        ROS:  All others reviewed and negative.  Objective        PE:  BP (!) 140/72 (BP Location: Right Arm, Patient Position: Sitting, Cuff Size: Large)   Pulse 64   Temp 97.8 F (36.6 C) (Oral)   Ht '5\' 8"'  (1.727 m)   Wt 214 lb (97.1 kg)   SpO2 94%   BMI 32.54 kg/m                 Constitutional: Pt appears in NAD               HENT: Head: NCAT.                Right Ear: External ear normal.                 Left Ear: External ear normal.                Eyes: . Pupils are equal, round, and reactive to light. Conjunctivae and EOM are normal               Nose: without d/c or deformity               Neck:  Neck supple. Gross normal ROM               Cardiovascular: Normal rate and regular rhythm.                 Pulmonary/Chest: Effort normal and breath sounds without rales or  wheezing.                Abd:  Soft, NT, ND, + BS, no organomegaly               Neurological: Pt is alert. At baseline orientation, motor with 4/5 LLE weakness, has diffuse lumbar spine tender in midline mostly lowest lumbar               Skin: Skin is warm. No rashes, no other new lesions, LE edema - none               Psychiatric: Pt behavior is normal without agitation   Micro: none  Cardiac tracings I have personally interpreted today:  none  Pertinent Radiological findings (summarize): none   Lab Results  Component Value Date   WBC 4.9 02/26/2022   HGB 11.0 (L) 02/26/2022   HCT 33.7 (L) 02/26/2022   PLT 127.0 (L) 02/26/2022   GLUCOSE 62 (L) 02/26/2022   CHOL 152 02/26/2022   TRIG 67.0 02/26/2022   HDL 76.00 02/26/2022   LDLDIRECT 107.5 07/21/2013   LDLCALC 63 02/26/2022   ALT 23 02/26/2022   AST 31 02/26/2022   NA 140 02/26/2022   K 4.8 02/26/2022   CL 103 02/26/2022   CREATININE 0.99 02/26/2022   BUN 36 (H) 02/26/2022   CO2 31 02/26/2022   TSH 3.19 02/26/2022   INR 1.0 09/18/2021   HGBA1C 6.4 02/26/2022   MICROALBUR 6.7 (H) 02/26/2022   Assessment/Plan:  Kelsey Keller is a 81 y.o. White or Caucasian [1] female with  has a past medical history of ALLERGIC RHINITIS (04/29/2007), Allergy, Basal cell carcinoma (03/24/2018), BCC (basal cell carcinoma) (10/18/2019), Cataract, DIABETES MELLITUS, TYPE II (06/23/2007), Elevated liver enzymes (03/2017), Elevated tumor markers, GERD (gastroesophageal reflux disease), HIATAL HERNIA, HX OF (04/29/2007), HYPERLIPIDEMIA (04/29/2007), HYPERTENSION (04/29/2007), HYPOKALEMIA (05/02/2009), HYPOTHYROIDISM (04/29/2007), LOW BACK PAIN (04/29/2007), Neuromuscular disorder ([place]), OSTEOARTHRITIS, KNEE, LEFT (06/23/2007), OSTEOPOROSIS NOS (05/12/2007), and SPINAL STENOSIS, LUMBAR (04/29/2007).  Left lumbar radiculopathy Now worsening recently, for MRI ls spine, and refer NS  Diabetes Lab Results  Component Value Date   HGBA1C 6.4  02/26/2022   overcontrolled by symptoms, pt to d/c glimeparide, cont all other tx, f/u a1c next visit    Essential hypertension BP Readings from Last 3 Encounters:  03/12/22 (!) 140/72  02/26/22 136/76  09/25/21 138/74   Uncontrolled,, likely reactive due to pain, pt to continue medical treatment tenormin 50 g qd, hct 25 mg qd  B12 deficiency Lab Results  Component Value Date   VITAMINB12 837 02/26/2022   Stable, cont oral replacement - b12 1000 mcg qd   Vitamin D deficiency Last vitamin D Lab Results  Component Value Date   VD25OH 41.21 02/26/2022   Stable, cont oral replacement  Followup: Return in about 6 months (around 09/12/2022).  Cathlean Cower, MD 03/13/2022 3:50 PM [facility]

## 2022-03-12 NOTE — Patient Instructions (Addendum)
Ok to stop the [employer] are given the weight note today  Please continue all other medications as before, and refills have been done if requested.  Please have the pharmacy call with any other refills you may need.  Please continue your efforts at being more active, low cholesterol diet, and weight control.  You are otherwise up to date with prevention measures today.  Please keep your appointments with your specialists as you may have planned  You will be contacted regarding the referral for: MRI and Neurosurgury  Please make an Appointment to return in 6 months, or sooner if needed, also with Lab Appointment for testing done 3-5 days before at the [facility] (so this is for TWO appointments - please see the scheduling desk as you leave)

## 2022-03-13 ENCOUNTER — Encounter: Payer: Self-pay | Admitting: Internal Medicine

## 2022-03-13 DIAGNOSIS — M5416 Radiculopathy, lumbar region: Secondary | ICD-10-CM | POA: Insufficient documentation

## 2022-03-13 NOTE — Assessment & Plan Note (Signed)
Now worsening recently, for MRI ls spine, and refer NS

## 2022-03-13 NOTE — Assessment & Plan Note (Signed)
Last vitamin D Lab Results  Component Value Date   VD25OH 41.21 02/26/2022   Stable, cont oral replacement

## 2022-03-13 NOTE — Assessment & Plan Note (Signed)
Lab Results  Component Value Date   HGBA1C 6.4 02/26/2022   overcontrolled by symptoms, pt to d/c glimeparide, cont all other tx, f/u a1c next visit

## 2022-03-13 NOTE — Assessment & Plan Note (Signed)
BP Readings from Last 3 Encounters:  03/12/22 (!) 140/72  02/26/22 136/76  09/25/21 138/74   Uncontrolled,, likely reactive due to pain, pt to continue medical treatment tenormin 50 g qd, hct 25 mg qd

## 2022-03-13 NOTE — Assessment & Plan Note (Signed)
Lab Results  Component Value Date   VITAMINB12 837 02/26/2022   Stable, cont oral replacement - b12 1000 mcg qd

## 2022-03-14 ENCOUNTER — Other Ambulatory Visit: Payer: Self-pay | Admitting: Internal Medicine

## 2022-03-14 NOTE — Telephone Encounter (Signed)
Please refill as per office routine med refill policy (all routine meds to be refilled for 3 mo or monthly (per pt preference) up to one year from last visit, then month to month grace period for 3 mo, then further med refills will have to be denied) ? ?

## 2022-03-15 ENCOUNTER — Other Ambulatory Visit: Payer: Self-pay

## 2022-03-15 DIAGNOSIS — K7460 Unspecified cirrhosis of liver: Secondary | ICD-10-CM

## 2022-03-29 ENCOUNTER — Other Ambulatory Visit: Payer: Medicare HMO

## 2022-03-30 ENCOUNTER — Other Ambulatory Visit: Payer: Medicare HMO

## 2022-04-01 ENCOUNTER — Ambulatory Visit (HOSPITAL_COMMUNITY)
Admission: RE | Admit: 2022-04-01 | Discharge: 2022-04-01 | Disposition: A | Discharge status: Home / Self Care | Payer: Medicare HMO | Source: Ambulatory Visit | Attending: Gastroenterology | Admitting: Gastroenterology

## 2022-04-01 DIAGNOSIS — Z0389 Encounter for observation for other suspected diseases and conditions ruled out: Secondary | ICD-10-CM | POA: Diagnosis not present

## 2022-04-01 DIAGNOSIS — K7460 Unspecified cirrhosis of liver: Secondary | ICD-10-CM | POA: Insufficient documentation

## 2022-04-22 ENCOUNTER — Telehealth: Payer: Self-pay | Admitting: Internal Medicine

## 2022-04-22 ENCOUNTER — Other Ambulatory Visit: Payer: Self-pay | Admitting: Internal Medicine

## 2022-04-22 NOTE — Telephone Encounter (Signed)
LVM for pt to rtn my call to schedule AWV with [facility] call back # [phone] 

## 2022-04-29 ENCOUNTER — Other Ambulatory Visit: Payer: Self-pay | Admitting: Internal Medicine

## 2022-05-01 ENCOUNTER — Other Ambulatory Visit: Payer: Self-pay | Admitting: Internal Medicine

## 2022-05-01 DIAGNOSIS — G5703 Lesion of sciatic nerve, bilateral lower limbs: Secondary | ICD-10-CM | POA: Diagnosis not present

## 2022-05-15 DIAGNOSIS — M5416 Radiculopathy, lumbar region: Secondary | ICD-10-CM | POA: Diagnosis not present

## 2022-05-15 DIAGNOSIS — Z6836 Body mass index (BMI) 36.0-36.9, adult: Secondary | ICD-10-CM | POA: Diagnosis not present

## 2022-06-10 ENCOUNTER — Telehealth: Payer: Self-pay | Admitting: Internal Medicine

## 2022-06-10 DIAGNOSIS — M5416 Radiculopathy, lumbar region: Secondary | ICD-10-CM

## 2022-06-10 NOTE — Telephone Encounter (Signed)
MRI was denied by ins. But pt says neurosurgeon doc requested CT WITH CONTRAST. Per pt request, do not pursue the mri based on the imaging the neurosurgeon wants done.

## 2022-06-11 NOTE — Telephone Encounter (Signed)
Please advise as I am not sure how to assist with this.

## 2022-06-11 NOTE — Telephone Encounter (Signed)
Ok CT is ordered

## 2022-06-13 ENCOUNTER — Other Ambulatory Visit (HOSPITAL_COMMUNITY): Payer: Self-pay | Admitting: Neurological Surgery

## 2022-06-13 DIAGNOSIS — M5416 Radiculopathy, lumbar region: Secondary | ICD-10-CM

## 2022-06-18 ENCOUNTER — Telehealth: Payer: Self-pay | Admitting: Internal Medicine

## 2022-06-18 NOTE — Telephone Encounter (Signed)
Sharyn Lull from [facility] called about the pt's CT Lumbar authorization. Sharyn Lull states that their office does their own authorizations, but can't while ours is active, and is asking [place] to withdrawal our authorization.   For any questions please call Sharyn Lull: [phone] .Marland KitchenMarland KitchenEXT. [id number]

## 2022-06-26 ENCOUNTER — Ambulatory Visit (HOSPITAL_COMMUNITY)
Admission: RE | Admit: 2022-06-26 | Discharge: 2022-06-26 | Disposition: A | Discharge status: Home / Self Care | Payer: Medicare HMO | Source: Ambulatory Visit | Attending: Neurological Surgery | Admitting: Neurological Surgery

## 2022-06-26 ENCOUNTER — Other Ambulatory Visit: Payer: Self-pay

## 2022-06-26 DIAGNOSIS — M546 Pain in thoracic spine: Secondary | ICD-10-CM | POA: Diagnosis not present

## 2022-06-26 DIAGNOSIS — M48061 Spinal stenosis, lumbar region without neurogenic claudication: Secondary | ICD-10-CM | POA: Insufficient documentation

## 2022-06-26 DIAGNOSIS — M5134 Other intervertebral disc degeneration, thoracic region: Secondary | ICD-10-CM | POA: Diagnosis not present

## 2022-06-26 DIAGNOSIS — M549 Dorsalgia, unspecified: Secondary | ICD-10-CM | POA: Diagnosis not present

## 2022-06-26 DIAGNOSIS — M5416 Radiculopathy, lumbar region: Secondary | ICD-10-CM | POA: Diagnosis not present

## 2022-06-26 DIAGNOSIS — M4804 Spinal stenosis, thoracic region: Secondary | ICD-10-CM | POA: Diagnosis not present

## 2022-06-26 DIAGNOSIS — M47814 Spondylosis without myelopathy or radiculopathy, thoracic region: Secondary | ICD-10-CM | POA: Diagnosis not present

## 2022-06-26 DIAGNOSIS — I700 Atherosclerosis of aorta: Secondary | ICD-10-CM | POA: Diagnosis not present

## 2022-06-26 DIAGNOSIS — M79605 Pain in left leg: Secondary | ICD-10-CM | POA: Diagnosis not present

## 2022-06-26 DIAGNOSIS — Z981 Arthrodesis status: Secondary | ICD-10-CM | POA: Diagnosis not present

## 2022-06-26 LAB — GLUCOSE, CAPILLARY: Glucose-Capillary: 168 mg/dL — ABNORMAL HIGH (ref 70–99)

## 2022-06-26 MED ORDER — ONDANSETRON HCL 4 MG/2ML IJ SOLN: 4.0000 mg | Freq: Four times a day (QID) | INTRAMUSCULAR | Status: DC | PRN

## 2022-06-26 MED ORDER — HYDROCODONE-ACETAMINOPHEN 5-325 MG PO TABS: 1.0000 | ORAL_TABLET | ORAL | Status: DC | PRN

## 2022-06-26 MED ORDER — LIDOCAINE HCL (PF) 1 % IJ SOLN
5.0000 mL | Freq: Once | INTRAMUSCULAR | Status: AC
  Administered 2022-06-26: 5 mL via INTRADERMAL

## 2022-06-26 MED ORDER — IOHEXOL 300 MG/ML  SOLN
10.0000 mL | Freq: Once | INTRAMUSCULAR | Status: AC | PRN
  Administered 2022-06-26: 9 mL via INTRATHECAL

## 2022-06-26 NOTE — Procedures (Signed)
Brittanny Levenhagen is a 81 year old individual whose had significant back pain mostly across the lumbosacral junction she has had an extensive decompression arthrodesis of her thoracolumbar spine from T10 down to the sacrum.  This was done by Dr. Glenna Fellows a number of years ago.  She has had increasing pain across the lumbosacral junction and notes that is becoming increasingly difficult to do simple activities she walks with a forward pitched in her gait and notes that any weightbearing exercise tends to be can considerably aggravating because of the amount of hardware in her back I have advised myelogram and postmyelogram CAT scan to be performed to better evaluate her spinal canal the nature of her arthrodesis and stability of her spine she is now undergoing this procedure.  Pre op Dx: Thoracic lumbar scoliosis Post op Dx: Same Procedure: Thoracic and lumbar myelograms Surgeon: Babette Stum Puncture level: L2-3 Fluid color: Clear colorless Injection: Iohexol 300, 9 mL Findings: Myelographic block at teeth 10 moderate spondylitic changes throughout the lumbar spine thoracolumbar fusion appears to be intact.  Further evaluation with CT scanning.

## 2022-07-03 DIAGNOSIS — M960 Pseudarthrosis after fusion or arthrodesis: Secondary | ICD-10-CM | POA: Diagnosis not present

## 2022-07-03 DIAGNOSIS — Z6836 Body mass index (BMI) 36.0-36.9, adult: Secondary | ICD-10-CM | POA: Diagnosis not present

## 2022-07-03 DIAGNOSIS — M4714 Other spondylosis with myelopathy, thoracic region: Secondary | ICD-10-CM | POA: Diagnosis not present

## 2022-07-03 DIAGNOSIS — M5416 Radiculopathy, lumbar region: Secondary | ICD-10-CM | POA: Diagnosis not present

## 2022-07-09 ENCOUNTER — Telehealth: Payer: Self-pay | Admitting: Internal Medicine

## 2022-07-09 NOTE — Telephone Encounter (Signed)
For our records:  We have received PRE-OP PW from [facility] for the pt and it has been placed in Dr. Gwynn Burly boxes.  Upon completion please fax to:  [phone]

## 2022-07-11 NOTE — Telephone Encounter (Signed)
Forms completed, signed and faxed to [facility]

## 2022-07-16 ENCOUNTER — Telehealth: Payer: Self-pay

## 2022-07-16 NOTE — Telephone Encounter (Signed)
-----   Message from Stevan Born, [place] sent at 09/25/2021  9:52 AM EDT ----- Regarding: FW: 1 year follow up  ----- Message ----- From: Stevan Born, CMA Sent: 09/25/2021   9:52 AM EDT To: Stevan Born, CMA Subject: 1 year follow up                               Pt needs an appt to see DJ in March 2024 dx cirrhosis.

## 2022-07-16 NOTE — Telephone Encounter (Signed)
Left message on voicemail for patient to return call to schedule 1 year follow up.  Patient will be scheduled to follow up with Dr Rush Landmark in Dr Ardis Hughs' absence.  Will continue efforts.

## 2022-07-16 NOTE — Telephone Encounter (Signed)
Patient has been scheduled for FU appt on 3/6 with Dr. Rush Landmark

## 2022-07-26 ENCOUNTER — Other Ambulatory Visit: Payer: Self-pay | Admitting: Neurological Surgery

## 2022-07-26 ENCOUNTER — Other Ambulatory Visit: Payer: Self-pay | Admitting: Internal Medicine

## 2022-07-26 NOTE — Telephone Encounter (Signed)
Please refill as per office routine med refill policy (all routine meds to be refilled for 3 mo or monthly (per pt preference) up to one year from last visit, then month to month grace period for 3 mo, then further med refills will have to be denied)

## 2022-07-27 ENCOUNTER — Other Ambulatory Visit: Payer: Self-pay | Admitting: Internal Medicine

## 2022-07-27 NOTE — Telephone Encounter (Signed)
Please refill as per office routine med refill policy (all routine meds to be refilled for 3 mo or monthly (per pt preference) up to one year from last visit, then month to month grace period for 3 mo, then further med refills will have to be denied)

## 2022-07-29 ENCOUNTER — Other Ambulatory Visit: Payer: Self-pay

## 2022-08-13 NOTE — Pre-Procedure Instructions (Signed)
Surgical Instructions    Your procedure is scheduled on Tuesday, August 20, 2022 at 7:30 AM.  Report to [facility] Main Entrance "A" at 5:30 A.M., then check in with the Admitting office.  Call this number if you have problems the morning of surgery:  [phone]   If you have any questions prior to your surgery date call [phone]: Open Monday-Friday 8am-4pm  *If you experience any cold or flu symptoms such as cough, fever, chills, shortness of breath, etc. between now and your scheduled surgery, please notify us.*    Remember:  Do not eat after midnight the night before your surgery  You may drink clear liquids until 4:30 AM the morning of your surgery.   Clear liquids allowed are: Water, Non-Citrus Juices (without pulp), Carbonated Beverages, Clear Tea, Black Coffee Only (NO MILK, CREAM OR POWDERED CREAMER of any kind), and Gatorade.    Take these medicines the morning of surgery with A SIP OF WATER:  atenolol (TENORMIN)  levothyroxine (SYNTHROID)   IF NEEDED: acetaminophen (TYLENOL)  fluticasone (FLONASE)  meclizine (ANTIVERT)  Polyethyl Glycol-Propyl Glycol (SYSTANE OP)  tiZANidine (ZANAFLEX)  traMADol (ULTRAM)   As of today, STOP taking any Aspirin (unless otherwise instructed by your surgeon) Aleve, Naproxen, Ibuprofen, Motrin, Advil, Goody's, BC's, all herbal medications, fish oil, and all vitamins.  WHAT DO I DO ABOUT MY DIABETES MEDICATION?   Do not take metFORMIN (GLUCOPHAGE) or pioglitazone (ACTOS) the morning of surgery.   HOW TO MANAGE YOUR DIABETES BEFORE AND AFTER SURGERY  Why is it important to control my blood sugar before and after surgery? Improving blood sugar levels before and after surgery helps healing and can limit problems. A way of improving blood sugar control is eating a healthy diet by:  Eating less sugar and carbohydrates  Increasing activity/exercise  Talking with your doctor about reaching your blood sugar goals High blood  sugars (greater than 180 mg/dL) can raise your risk of infections and slow your recovery, so you will need to focus on controlling your diabetes during the weeks before surgery. Make sure that the doctor who takes care of your diabetes knows about your planned surgery including the date and location.  How do I manage my blood sugar before surgery? Check your blood sugar at least 4 times a day, starting 2 days before surgery, to make sure that the level is not too high or low.  Check your blood sugar the morning of your surgery when you wake up and every 2 hours until you get to the Short Stay unit.  If your blood sugar is less than 70 mg/dL, you will need to treat for low blood sugar: Do not take insulin. Treat a low blood sugar (less than 70 mg/dL) with  cup of clear juice (cranberry or apple), 4 glucose tablets, OR glucose gel. Recheck blood sugar in 15 minutes after treatment (to make sure it is greater than 70 mg/dL). If your blood sugar is not greater than 70 mg/dL on recheck, call [phone] for further instructions. Report your blood sugar to the short stay nurse when you get to Short Stay.  If you are admitted to the hospital after surgery: Your blood sugar will be checked by the staff and you will probably be given insulin after surgery (instead of oral diabetes medicines) to make sure you have good blood sugar levels. The goal for blood sugar control after surgery is 80-180 mg/dL.  Do NOT Smoke (Tobacco/Vaping) for 24 hours prior to your procedure.  If you use a CPAP at night, you may bring your mask/headgear for your overnight stay.   Contacts, glasses, piercing's, hearing aid's, dentures or partials may not be worn into surgery, please bring cases for these belongings.    For patients admitted to the hospital, discharge time will be determined by your treatment team.   Patients discharged the day of surgery will not be allowed to drive home, and someone  needs to stay with them for 24 hours.  SURGICAL WAITING ROOM VISITATION Patients having surgery or a procedure may have 2 support people in the waiting area. Visitors may stay in the waiting area during the procedure and switch out with other visitors if needed. Only 1 support person is allowed in the pre-op area with the patient AFTER the patient is prepped. This person cannot be switched out.  Children under the age of 38 must have an adult accompany them who is not the patient. If the patient needs to stay at the hospital during part of their recovery, the visitor guidelines for inpatient rooms apply.  Please refer to the [facility] website for the visitor guidelines for Inpatients (after your surgery is over and you are in a regular room).    Special instructions:   [place]- Preparing For Surgery  Before surgery, you can play an important role. Because skin is not sterile, your skin needs to be as free of germs as possible. You can reduce the number of germs on your skin by washing with CHG (chlorahexidine gluconate) Soap before surgery.  CHG is an antiseptic cleaner which kills germs and bonds with the skin to continue killing germs even after washing.    Oral Hygiene is also important to reduce your risk of infection.  Remember - BRUSH YOUR TEETH THE MORNING OF SURGERY WITH YOUR REGULAR TOOTHPASTE  Please do not use if you have an allergy to CHG or antibacterial soaps. If your skin becomes reddened/irritated stop using the CHG.  Do not shave (including legs and underarms) for at least 48 hours prior to first CHG shower. It is OK to shave your face.  Please follow these instructions carefully.   Shower the NIGHT BEFORE SURGERY and the MORNING OF SURGERY  If you chose to wash your hair, wash your hair first as usual with your normal shampoo.  After you shampoo, rinse your hair and body thoroughly to remove the shampoo.  Use CHG Soap as you would any other liquid soap. You can  apply CHG directly to the skin and wash gently with a scrungie or a clean washcloth.   Apply the CHG Soap to your body ONLY FROM THE NECK DOWN (neck, arms, chest, abdomen, legs, and back).  Do not use on open wounds or open sores. Avoid contact with your eyes, ears, mouth and genitals (private parts). Wash Face and genitals (private parts)  with your normal soap.   Wash thoroughly, paying special attention to the area where your surgery will be performed.  Thoroughly rinse your body with warm water from the neck down.  DO NOT shower/wash with your normal soap after using and rinsing off the CHG Soap.  Pat yourself dry with a CLEAN TOWEL.  Wear CLEAN PAJAMAS to bed the night before surgery  Place CLEAN SHEETS on your bed the night before your surgery  DO NOT SLEEP WITH PETS.   Day of Surgery: Take a shower with CHG soap. Do not  wear lotions, powders, perfumes/colognes, or deodorant. Do not wear jewelry or makeup Do not shave 48 hours prior to surgery. Do not wear nail polish, gel polish, artificial nails, or any other type of covering on natural nails (fingers and toes) If you have artificial nails or gel coating that need to be removed by a nail salon, please have this removed prior to surgery. Artificial nails or gel coating may interfere with anesthesia's ability to adequately monitor your vital signs. Wear Clean/Comfortable clothing the morning of surgery Do not bring valuables to the hospital.  [facility] is not responsible for any belongings or valuables. Remember to brush your teeth WITH YOUR REGULAR TOOTHPASTE.   Please read over the following fact sheets that you were given.  If you received a COVID test during your pre-op visit  it is requested that you wear a mask when out in public, stay away from anyone that may not be feeling well and notify your surgeon if you develop symptoms. If you have been in contact with anyone that has tested positive in the last 10 days please  notify you surgeon.

## 2022-08-14 ENCOUNTER — Encounter (HOSPITAL_COMMUNITY): Payer: Self-pay | Admitting: Physician Assistant

## 2022-08-14 ENCOUNTER — Other Ambulatory Visit (HOSPITAL_COMMUNITY): Payer: Self-pay | Admitting: Physician Assistant

## 2022-08-14 ENCOUNTER — Encounter (HOSPITAL_COMMUNITY): Payer: Self-pay

## 2022-08-14 ENCOUNTER — Other Ambulatory Visit: Payer: Self-pay

## 2022-08-14 ENCOUNTER — Encounter (HOSPITAL_COMMUNITY)
Admission: RE | Admit: 2022-08-14 | Discharge: 2022-08-14 | Disposition: A | Discharge status: Home / Self Care | Payer: Medicare HMO | Source: Ambulatory Visit | Attending: Neurological Surgery | Admitting: Neurological Surgery

## 2022-08-14 ENCOUNTER — Ambulatory Visit (HOSPITAL_COMMUNITY)
Admission: RE | Admit: 2022-08-14 | Discharge: 2022-08-14 | Disposition: A | Discharge status: Home / Self Care | Payer: Medicare HMO | Source: Ambulatory Visit | Attending: Anesthesiology | Admitting: Anesthesiology

## 2022-08-14 VITALS — BP 157/64 | HR 69 | Temp 97.7°F | Resp 18 | Ht 64.0 in | Wt 213.8 lb

## 2022-08-14 DIAGNOSIS — I10 Essential (primary) hypertension: Secondary | ICD-10-CM | POA: Insufficient documentation

## 2022-08-14 DIAGNOSIS — R011 Cardiac murmur, unspecified: Secondary | ICD-10-CM

## 2022-08-14 DIAGNOSIS — Z01818 Encounter for other preprocedural examination: Secondary | ICD-10-CM | POA: Diagnosis not present

## 2022-08-14 DIAGNOSIS — E119 Type 2 diabetes mellitus without complications: Secondary | ICD-10-CM

## 2022-08-14 DIAGNOSIS — E785 Hyperlipidemia, unspecified: Secondary | ICD-10-CM | POA: Insufficient documentation

## 2022-08-14 HISTORY — DX: Cardiac murmur, unspecified: R01.1

## 2022-08-14 LAB — CBC
HCT: 31.6 % — ABNORMAL LOW (ref 36.0–46.0)
Hemoglobin: 10.1 g/dL — ABNORMAL LOW (ref 12.0–15.0)
MCH: 28.8 pg (ref 26.0–34.0)
MCHC: 32 g/dL (ref 30.0–36.0)
MCV: 90 fL (ref 80.0–100.0)
Platelets: 165 10*3/uL (ref 150–400)
RBC: 3.51 MIL/uL — ABNORMAL LOW (ref 3.87–5.11)
RDW: 14.6 % (ref 11.5–15.5)
WBC: 5 10*3/uL (ref 4.0–10.5)
nRBC: 0 % (ref 0.0–0.2)

## 2022-08-14 LAB — COMPREHENSIVE METABOLIC PANEL
ALT: 18 U/L (ref 0–44)
AST: 30 U/L (ref 15–41)
Albumin: 3.6 g/dL (ref 3.5–5.0)
Alkaline Phosphatase: 68 U/L (ref 38–126)
Anion gap: 8 (ref 5–15)
BUN: 26 mg/dL — ABNORMAL HIGH (ref 8–23)
CO2: 27 mmol/L (ref 22–32)
Calcium: 10 mg/dL (ref 8.9–10.3)
Chloride: 102 mmol/L (ref 98–111)
Creatinine, Ser: 1.01 mg/dL — ABNORMAL HIGH (ref 0.44–1.00)
GFR, Estimated: 57 mL/min — ABNORMAL LOW (ref >60)
Glucose, Bld: 181 mg/dL — ABNORMAL HIGH (ref 70–99)
Potassium: 3.7 mmol/L (ref 3.5–5.1)
Sodium: 137 mmol/L (ref 135–145)
Total Bilirubin: 0.8 mg/dL (ref 0.3–1.2)
Total Protein: 6.7 g/dL (ref 6.5–8.1)

## 2022-08-14 LAB — TYPE AND SCREEN
ABO/RH(D): O POS
Antibody Screen: NEGATIVE

## 2022-08-14 LAB — HEMOGLOBIN A1C
Hgb A1c MFr Bld: 7.3 % — ABNORMAL HIGH (ref 4.8–5.6)
Mean Plasma Glucose: 162.81 mg/dL

## 2022-08-14 LAB — SURGICAL PCR SCREEN
MRSA, PCR: NEGATIVE
Staphylococcus aureus: POSITIVE — AB

## 2022-08-14 LAB — ECHOCARDIOGRAM COMPLETE
AR max vel: 1.27 cm2
AV Area VTI: 1.13 cm2
AV Area mean vel: 1.12 cm2
AV Mean grad: 16.3 mmHg
AV Peak grad: 26.5 mmHg
Ao pk vel: 2.57 m/s
Area-P 1/2: 3.31 cm2
Height: 64 in
S' Lateral: 3.4 cm
Weight: 3420.8 oz

## 2022-08-14 LAB — GLUCOSE, CAPILLARY: Glucose-Capillary: 188 mg/dL — ABNORMAL HIGH (ref 70–99)

## 2022-08-14 NOTE — Progress Notes (Addendum)
PCP - Dr.James John Cardiologist - pt denies  PPM/ICD - pt denies Device Orders - n/a Rep Notified - n/a  Chest x-ray - n/a EKG - 08/14/22 Stress Test - 10/29/10 ECHO - pt denies Cardiac Cath - pt denies  Sleep Study - pt denies CPAP - n/a  Fasting Blood Sugar - 120's  Checks Blood Sugar 2 times a week per pt. At PAT visit today CBG =188-patient had a protein shake this am  Last dose of GLP1 agonist-  pt denies GLP1 instructions: n/a  Blood Thinner Instructions:pt denies Aspirin Instructions:Follow your surgeon's instructions on when to stop Aspirin.  Patient states she was told to stop Aspirin 10 days before surgery. She has stopped taking Aspirin last week per pt.   ERAS Protcol -yes PRE-SURGERY Ensure or G2- none ordered   COVID TEST- n/a   Anesthesia review: YES, history of heart murmur per patient. Karoline Caldwell, PA notified. Karoline Caldwell, PA in to see patient. Echo recommended by Jeneen Rinks, [place] and patient was able to have the test today at [facility] after PAT appointment. Jeneen Rinks, [place] escorted patient to have Echo.   Patient denies shortness of breath, fever, cough and chest pain at PAT appointment   All instructions explained to the patient, with a verbal understanding of the material. Patient agrees to go over the instructions while at home for a better understanding. Patient also instructed to self quarantine after being tested for COVID-19. The opportunity to ask questions was provided.

## 2022-08-15 NOTE — Progress Notes (Signed)
Anesthesia Chart Review:  Patient is a 82 year old female with history of non-insulin-dependent DM2, HTN, hiatal hernia, GERD, hypothyroid.  I evaluated her at her preadmission testing appointment due to her report of possible heart murmur.  She stated that several years ago one of her providers mentioned to her that she had a heart murmur but did not recommend any further evaluation.  Patient denies any history of cardiac disease, denies any symptoms of palpitations, chest pain, DOE, presyncope or syncope.  Her functional status is quite limited secondary to her thoracolumbar spinal pathology.  She ambulates with a walker.    On exam she is well-appearing, no acute distress.  Auscultation reveals a harsh 3 out of 6 systolic murmur.  I discussed with the patient that this needs to be evaluated prior to undergoing elective surgery, particularly given that hers was quite a large surgery.  Patient verbalized understanding and expressed concern about getting this done prior to her surgery date on 08/20/2022.  Fortunately, I was able to get her worked into the [employer] and she was able to have her echocardiogram done following her PAT appointment.  Echo showed EF 65% with grade 2 diastolic dysfunction, mild aortic stenosis with mean gradient 16.3 mmHg with significant calcification/restriction of the NCC/LCC suggesting valve is functionally bicuspid.  Reviewed the result with anesthesiologist Dr. Roanna Banning.  He stated that given patient was having no symptoms associated with this and that her gradient was mild, she can proceed with surgery as planned barring acute status change.  I did reach out to patient's PCP Dr. Cathlean Cower to make him aware of patient's aortic stenosis for longitudinal follow-up.  Preop labs reviewed, mild anemia with hemoglobin 10.1, non-insulin-dependent DM2 reasonably well-controlled with A1c 7.3, otherwise unremarkable.  EKG 08/14/2022: Sinus rhythm with 1st degree A-V block.  Septal infarct , age undetermined  TTE 08/14/2022:  1. Left ventricular ejection fraction, by estimation, is 60 to 65%. The  left ventricle has normal function. The left ventricle has no regional  wall motion abnormalities. Left ventricular diastolic parameters are  consistent with Grade II diastolic  dysfunction (pseudonormalization).   2. Right ventricular systolic function is normal. The right ventricular  size is normal. There is normal pulmonary artery systolic pressure.   3. Left atrial size was moderately dilated.   4. Right atrial size was mildly dilated.   5. The mitral valve is normal in structure. Trivial mitral valve  regurgitation. No evidence of mitral stenosis.   6. The aortic valve is calcified. There is moderate calcification of the  aortic valve. There is moderate thickening of the aortic valve. Aortic  valve regurgitation is trivial. Mild aortic valve stenosis. Aortic valve  mean gradient measures 16.3 mmHg.   7. The inferior vena cava is normal in size with greater than 50%  respiratory variability, suggesting right atrial pressure of 3 mmHg.   Comparison(s): No prior Echocardiogram.   Conclusion(s)/Recommendation(s): Mild aortic stenosis, aortic valve with  significant calcification/restriction of NCC/LCC suggesting valve is  functionally bicuspid.     Wynonia Musty [facility] Short Stay Center/Anesthesiology Phone [phone] 08/15/2022 11:59 AM

## 2022-08-15 NOTE — Anesthesia Preprocedure Evaluation (Deleted)
Anesthesia Evaluation    Airway        Dental   Pulmonary           Cardiovascular hypertension,      Neuro/Psych    GI/Hepatic   Endo/Other  diabetes    Renal/GU      Musculoskeletal   Abdominal   Peds  Hematology   Anesthesia Other Findings   Reproductive/Obstetrics                              Anesthesia Physical Anesthesia Plan  ASA:   Anesthesia Plan:    Post-op Pain Management:    Induction:   PONV Risk Score and Plan:   Airway Management Planned:   Additional Equipment:   Intra-op Plan:   Post-operative Plan:   Informed Consent:   Plan Discussed with:   Anesthesia Plan Comments: (PAT note by Karoline Caldwell, PA-C: Patient is a 82 year old female with history of non-insulin-dependent DM2, HTN, hiatal hernia, GERD, hypothyroid.  I evaluated her at her preadmission testing appointment due to her report of possible heart murmur.  She stated that several years ago one of her providers mentioned to her that she had a heart murmur but did not recommend any further evaluation.  Patient denies any history of cardiac disease, denies any symptoms of palpitations, chest pain, DOE, presyncope or syncope.  Her functional status is quite limited secondary to her thoracolumbar spinal pathology.  She ambulates with a walker.    On exam she is well-appearing, no acute distress.  Auscultation reveals a harsh 3 out of 6 systolic murmur.  I discussed with the patient that this needs to be evaluated prior to undergoing elective surgery, particularly given that hers was quite a large surgery.  Patient verbalized understanding and expressed concern about getting this done prior to her surgery date on 08/20/2022.  Fortunately, I was able to get her worked into the [employer] and she was able to have her echocardiogram done following her PAT appointment.  Echo showed EF 65% with grade 2 diastolic  dysfunction, mild aortic stenosis with mean gradient 16.3 mmHg with significant calcification/restriction of the NCC/LCC suggesting valve is functionally bicuspid.  Reviewed the result with anesthesiologist Dr. Roanna Banning.  He stated that given patient was having no symptoms associated with this and that her gradient was mild, she can proceed with surgery as planned barring acute status change.  I did reach out to patient's PCP Dr. Cathlean Cower to make him aware of patient's aortic stenosis for longitudinal follow-up.  Preop labs reviewed, mild anemia with hemoglobin 10.1, non-insulin-dependent DM2 reasonably well-controlled with A1c 7.3, otherwise unremarkable.  EKG 08/14/2022: Sinus rhythm with 1st degree A-V block. Septal infarct , age undetermined  TTE 08/14/2022:  1. Left ventricular ejection fraction, by estimation, is 60 to 65%. The  left ventricle has normal function. The left ventricle has no regional  wall motion abnormalities. Left ventricular diastolic parameters are  consistent with Grade II diastolic  dysfunction (pseudonormalization).   2. Right ventricular systolic function is normal. The right ventricular  size is normal. There is normal pulmonary artery systolic pressure.   3. Left atrial size was moderately dilated.   4. Right atrial size was mildly dilated.   5. The mitral valve is normal in structure. Trivial mitral valve  regurgitation. No evidence of mitral stenosis.   6. The aortic valve is calcified. There is moderate calcification of the  aortic valve.  There is moderate thickening of the aortic valve. Aortic  valve regurgitation is trivial. Mild aortic valve stenosis. Aortic valve  mean gradient measures 16.3 mmHg.   7. The inferior vena cava is normal in size with greater than 50%  respiratory variability, suggesting right atrial pressure of 3 mmHg.   Comparison(s): No prior Echocardiogram.   Conclusion(s)/Recommendation(s): Mild aortic stenosis, aortic valve with   significant calcification/restriction of NCC/LCC suggesting valve is  functionally bicuspid.    )         Anesthesia Quick Evaluation

## 2022-08-19 DIAGNOSIS — M960 Pseudarthrosis after fusion or arthrodesis: Secondary | ICD-10-CM | POA: Diagnosis not present

## 2022-08-20 ENCOUNTER — Inpatient Hospital Stay (HOSPITAL_COMMUNITY): Admission: RE | Admit: 2022-08-20 | Payer: Medicare HMO | Source: Home / Self Care | Admitting: Neurological Surgery

## 2022-08-20 ENCOUNTER — Encounter (HOSPITAL_COMMUNITY): Admission: RE | Payer: Self-pay | Source: Home / Self Care

## 2022-08-20 SURGERY — POSTERIOR THORACIC FUSION 3 LEVEL
Anesthesia: General

## 2022-08-27 DIAGNOSIS — B349 Viral infection, unspecified: Secondary | ICD-10-CM | POA: Diagnosis not present

## 2022-08-27 DIAGNOSIS — R059 Cough, unspecified: Secondary | ICD-10-CM | POA: Diagnosis not present

## 2022-08-27 DIAGNOSIS — J40 Bronchitis, not specified as acute or chronic: Secondary | ICD-10-CM | POA: Diagnosis not present

## 2022-08-27 DIAGNOSIS — R5383 Other fatigue: Secondary | ICD-10-CM | POA: Diagnosis not present

## 2022-08-28 ENCOUNTER — Other Ambulatory Visit: Payer: Self-pay | Admitting: Internal Medicine

## 2022-08-28 NOTE — Telephone Encounter (Signed)
Please refill as per office routine med refill policy (all routine meds to be refilled for 3 mo or monthly (per pt preference) up to one year from last visit, then month to month grace period for 3 mo, then further med refills will have to be denied) ? ?

## 2022-09-03 ENCOUNTER — Ambulatory Visit (INDEPENDENT_AMBULATORY_CARE_PROVIDER_SITE_OTHER): Payer: Medicare HMO | Admitting: Internal Medicine

## 2022-09-03 VITALS — BP 138/76 | HR 91 | Temp 98.1°F | Ht 64.0 in | Wt 215.0 lb

## 2022-09-03 DIAGNOSIS — I10 Essential (primary) hypertension: Secondary | ICD-10-CM

## 2022-09-03 DIAGNOSIS — R051 Acute cough: Secondary | ICD-10-CM

## 2022-09-03 DIAGNOSIS — R062 Wheezing: Secondary | ICD-10-CM | POA: Diagnosis not present

## 2022-09-03 DIAGNOSIS — J40 Bronchitis, not specified as acute or chronic: Secondary | ICD-10-CM | POA: Diagnosis not present

## 2022-09-03 DIAGNOSIS — E1165 Type 2 diabetes mellitus with hyperglycemia: Secondary | ICD-10-CM | POA: Diagnosis not present

## 2022-09-03 LAB — POC INFLUENZA A&B (BINAX/QUICKVUE)
Influenza A, POC: NEGATIVE
Influenza B, POC: NEGATIVE — NL

## 2022-09-03 LAB — POCT RESPIRATORY SYNCYTIAL VIRUS: RSV Rapid Ag: NEGATIVE

## 2022-09-03 LAB — POC SOFIA SARS ANTIGEN FIA: SARS Coronavirus 2 Ag: NEGATIVE

## 2022-09-03 MED ORDER — LEVOFLOXACIN 500 MG PO TABS: 500.0000 mg | ORAL_TABLET | Freq: Every day | ORAL | 0 refills | Status: AC

## 2022-09-03 MED ORDER — HYDROCODONE BIT-HOMATROP MBR 5-1.5 MG/5ML PO SOLN: 5.0000 mL | Freq: Four times a day (QID) | ORAL | 0 refills | Status: AC | PRN

## 2022-09-03 MED ORDER — PREDNISONE 10 MG PO TABS: ORAL_TABLET | ORAL | 0 refills | Status: DC

## 2022-09-03 MED ORDER — METHYLPREDNISOLONE ACETATE 80 MG/ML IJ SUSP
80.0000 mg | Freq: Once | INTRAMUSCULAR | Status: AC
  Administered 2022-09-03: 80 mg via INTRAMUSCULAR

## 2022-09-03 NOTE — Patient Instructions (Signed)
You had the steroid shot today  Please take all new medication as prescribed - the antibiotic, cough medicine, and prednisone  Please continue all other medications as before, including the Inhaler  Please have the pharmacy call with any other refills you may need.  Please continue your efforts at being more active, low cholesterol diet, and weight control.  Please keep your appointments with your specialists as you may have planned

## 2022-09-03 NOTE — Progress Notes (Signed)
Patient ID: [name], female   DOB: [birth year], 82 y.o.   MRN: [id number]        Chief Complaint: follow up worsening 10 days cough, wheezing sob       HPI:  Kelsey Keller is a 82 y.o. female here with c/o above CC with feeling feverish at times, can't stop coughing at night, and increased wheezing sob despite albuerol inharer use increase.  Pt denies chest pain, orthopnea, PND, increased LE swelling, palpitations, dizziness or syncope. Denies worsening depressive symptoms, suicidal ideation, or panic; has ongoing anxiety, not increased recently.    Pt denies polydipsia, polyuria, or new focal neuro s/s.         Wt Readings from Last 3 Encounters:  09/03/22 215 lb (97.5 kg)  08/14/22 213 lb 12.8 oz (97 kg)  06/26/22 212 lb (96.2 kg)   BP Readings from Last 3 Encounters:  09/03/22 138/76  08/14/22 (!) 157/64  06/26/22 (!) 152/72         Past Medical History:  Diagnosis Date   ALLERGIC RHINITIS 04/29/2007   Allergy    Basal cell carcinoma 03/24/2018   sclerosis- upper bridge of nose (MOHS)   BCC (basal cell carcinoma) 10/18/2019   right supra orbital region- Cx3 74f   Cataract    both eyes   DIABETES MELLITUS, TYPE II 06/23/2007   type 2   Elevated liver enzymes 03/2017   Elevated tumor markers    GERD (gastroesophageal reflux disease)    Heart murmur    per patient   HIATAL HERNIA, HX OF 04/29/2007   HYPERLIPIDEMIA 04/29/2007   diet controlled on CoQ10   HYPERTENSION 04/29/2007   HYPOKALEMIA 05/02/2009   HYPOTHYROIDISM 04/29/2007   LOW BACK PAIN 04/29/2007   Neuromuscular disorder ([place]    diabetic neuropathy in both legs   OSTEOARTHRITIS, KNEE, LEFT 06/23/2007   OSTEOPOROSIS NOS 05/12/2007   SPINAL STENOSIS, LUMBAR 04/29/2007   Past Surgical History:  Procedure Laterality Date   BACK SURGERY N/A 2014 x 2   T1 TO S 1    CATARACT EXTRACTION W/ INTRAOCULAR LENS  IMPLANT, BILATERAL Bilateral 05/2011   doing well   COLONOSCOPY WITH PROPOFOL N/A 04/10/2017    Procedure: COLONOSCOPY WITH PROPOFOL;  Surgeon: JMilus Banister MD;  Location: [facility];  Service: Endoscopy;  Laterality: N/A;   COLONSCOPY  2011   TOTAL KNEE ARTHROPLASTY  2006   right   TOTAL KNEE ARTHROPLASTY Left 2012    reports that she has never smoked. She has never used smokeless tobacco. She reports current alcohol use. She reports that she does not use drugs. family history includes Arthritis in her brother and mother; COPD in her brother; Colon polyps in an other family member; Coronary artery disease in her father and another family member; Diabetes in an other family member; Hyperlipidemia in her brother; Hypertension in her mother; Stroke in her brother. No Known Allergies Current Outpatient Medications on File Prior to Visit  Medication Sig Dispense Refill   acetaminophen (TYLENOL) 650 MG CR tablet Take 1,300 mg by mouth every 8 (eight) hours as needed for pain.     albuterol (VENTOLIN HFA) 108 (90 Base) MCG/ACT inhaler Inhale into the lungs.     aspirin 81 MG EC tablet Take 81 mg by mouth at bedtime.     atenolol (TENORMIN) 50 MG tablet TAKE 1 TABLET BY MOUTH EVERY DAY 90 tablet 1   blood glucose meter kit and supplies KIT Dispense with patient  Insurance;  Use up to four times daily as directed. E11.9 1 each 0   Blood Glucose Monitoring Suppl (ONE TOUCH ULTRA 2) w/Device KIT Use as directed up to 4 times per day E11.9 1 each 0   Cholecalciferol (VITAMIN D3) 50 MCG (2000 UT) TABS Take 2,000 Units by mouth daily.     Co-Enzyme Q-10 100 MG CAPS Take 100 mg by mouth daily.     cyanocobalamin 1000 MCG tablet Take 1,000 mcg by mouth daily.     fluticasone (FLONASE) 50 MCG/ACT nasal spray SPRAY 2 SPRAYS INTO EACH NOSTRIL EVERY DAY (Patient taking differently: Place 2 sprays into both nostrils daily as needed for allergies.) 48 mL 3   glucose blood (ONE TOUCH ULTRA TEST) test strip Use as instructed up to 4 times per day  E11.9 100 each 12   hydrochlorothiazide (HYDRODIURIL) 25  MG tablet TAKE 1 TABLET BY MOUTH EVERY DAY 90 tablet 3   Lancets MISC Use as directed up to four times per day E11.9 200 each 11   levothyroxine (SYNTHROID) 125 MCG tablet TAKE 1 TABLET BY MOUTH EVERY DAY BEFORE BREAKFAST 90 tablet 1   Magnesium 250 MG TABS Take 250 mg by mouth daily.     meclizine (ANTIVERT) 12.5 MG tablet Take 1 tablet (12.5 mg total) by mouth 3 (three) times daily as needed for dizziness. 90 tablet 1   meloxicam (MOBIC) 7.5 MG tablet TAKE 1 TABLET BY MOUTH EVERY DAY AS NEEDED FOR PAIN 90 tablet 1   Menthol, Topical Analgesic, (BENGAY EX) Apply 1 Application topically daily as needed (arthritis pain).     metFORMIN (GLUCOPHAGE) 1000 MG tablet TAKE 1 TABLET BY MOUTH TWICE A DAY WITH FOOD 180 tablet 1   omeprazole (PRILOSEC) 40 MG capsule TAKE 1 CAPSULE (40 MG TOTAL) BY MOUTH DAILY. (Patient taking differently: Take 40 mg by mouth at bedtime.) 90 capsule 3   pioglitazone (ACTOS) 30 MG tablet TAKE 1 TABLET BY MOUTH EVERY DAY 90 tablet 3   Polyethyl Glycol-Propyl Glycol (SYSTANE OP) Place 1 drop into both eyes daily as needed (dry eyes).     Red Yeast Rice Extract 600 MG CAPS Take 1,200 mg by mouth 2 (two) times daily.     tiZANidine (ZANAFLEX) 2 MG tablet TAKE 1 TABLET BY MOUTH AT BEDTIME AS NEEDED FOR MUSCLE SPASMS. 90 tablet 1   traMADol (ULTRAM) 50 MG tablet TAKE 1 TABLET BY MOUTH EVERY 8 HOURS AS NEEDED. 90 tablet 5   No current facility-administered medications on file prior to visit.        ROS:  All others reviewed and negative.  Objective        PE:  BP 138/76 (BP Location: Left Arm, Patient Position: Sitting, Cuff Size: Large)   Pulse 91   Temp 98.1 F (36.7 C) (Oral)   Ht '5\' 4"'$  (1.626 m)   Wt 215 lb (97.5 kg)   SpO2 94%   BMI 36.90 kg/m                 Constitutional: Pt appears in NAD, mild ill appearing               HENT: Head: NCAT.                Right Ear: External ear normal.                 Left Ear: External ear normal.  Bilat tm's with mild  erythema.  Max sinus areas non tender.  Pharynx with mild erythema, no exudate               Eyes: . Pupils are equal, round, and reactive to light. Conjunctivae and EOM are normal               Nose: without d/c or deformity               Neck: Neck supple. Gross normal ROM               Cardiovascular: Normal rate and regular rhythm.                 Pulmonary/Chest: Effort normal and breath sounds without rales but with mild bilateral wheezing.                               Neurological: Pt is alert. At baseline orientation, motor grossly intact               Skin: Skin is warm. No rashes, no other new lesions, LE edema - none               Psychiatric: Pt behavior is normal without agitation   Micro: none  Cardiac tracings I have personally interpreted today:  none  Pertinent Radiological findings (summarize): none   Lab Results  Component Value Date   WBC 5.0 08/14/2022   HGB 10.1 (L) 08/14/2022   HCT 31.6 (L) 08/14/2022   PLT 165 08/14/2022   GLUCOSE 181 (H) 08/14/2022   CHOL 152 02/26/2022   TRIG 67.0 02/26/2022   HDL 76.00 02/26/2022   LDLDIRECT 107.5 07/21/2013   LDLCALC 63 02/26/2022   ALT 18 08/14/2022   AST 30 08/14/2022   NA 137 08/14/2022   K 3.7 08/14/2022   CL 102 08/14/2022   CREATININE 1.01 (H) 08/14/2022   BUN 26 (H) 08/14/2022   CO2 27 08/14/2022   TSH 3.19 02/26/2022   INR 1.0 09/18/2021   HGBA1C 7.3 (H) 08/14/2022   MICROALBUR 6.7 (H) 02/26/2022   POCT - COVID - neg, Flu - neg, RSV - neg  Assessment/Plan:  XANDRIA LECHTENBERG is a 82 y.o. White or Caucasian [1] female with  has a past medical history of ALLERGIC RHINITIS (04/29/2007), Allergy, Basal cell carcinoma (03/24/2018), BCC (basal cell carcinoma) (10/18/2019), Cataract, DIABETES MELLITUS, TYPE II (06/23/2007), Elevated liver enzymes (03/2017), Elevated tumor markers, GERD (gastroesophageal reflux disease), Heart murmur, HIATAL HERNIA, HX OF (04/29/2007), HYPERLIPIDEMIA (04/29/2007), HYPERTENSION  (04/29/2007), HYPOKALEMIA (05/02/2009), HYPOTHYROIDISM (04/29/2007), LOW BACK PAIN (04/29/2007), Neuromuscular disorder ([place]), OSTEOARTHRITIS, KNEE, LEFT (06/23/2007), OSTEOPOROSIS NOS (05/12/2007), and SPINAL STENOSIS, LUMBAR (04/29/2007).  Cough Mild to mod, c/w bronchitis vs pna , for antibx course,  declines cxr, for levaquin 500 mg qd course, and cough med prn, to f/u any worsening symptoms or concerns  Diabetes Lab Results  Component Value Date   HGBA1C 7.3 (H) 08/14/2022   Suncontrolled, goal A1c < 7,, pt to continue current medical treatment metfomrin 1000 bid, actos 30 mg qd as declines change today   Essential hypertension BP Readings from Last 3 Encounters:  09/03/22 138/76  08/14/22 (!) 157/64  06/26/22 (!) 152/72   Stable, pt to continue medical treatment tenormin 50 mg qd, hct 25 mg qd   Wheezing Mild to mod, for depomedrol IM 80 mg x 1, prednisone taper, and continue albuterol inhaler prn,  to f/u any worsening symptoms or concerns  Followup: Return in about 9  days (around 09/12/2022).  Cathlean Cower, MD 09/07/2022 2:49 PM [facility]

## 2022-09-07 ENCOUNTER — Encounter: Payer: Self-pay | Admitting: Internal Medicine

## 2022-09-07 NOTE — Assessment & Plan Note (Signed)
Mild to mod, c/w bronchitis vs pna , for antibx course,  declines cxr, for levaquin 500 mg qd course, and cough med prn, to f/u any worsening symptoms or concerns

## 2022-09-07 NOTE — Assessment & Plan Note (Signed)
Mild to mod, for depomedrol IM 80 mg x 1, prednisone taper, and continue albuterol inhaler prn,  to f/u any worsening symptoms or concerns

## 2022-09-07 NOTE — Assessment & Plan Note (Signed)
BP Readings from Last 3 Encounters:  09/03/22 138/76  08/14/22 (!) 157/64  06/26/22 (!) 152/72   Stable, pt to continue medical treatment tenormin 50 mg qd, hct 25 mg qd

## 2022-09-07 NOTE — Assessment & Plan Note (Signed)
Lab Results  Component Value Date   HGBA1C 7.3 (H) 08/14/2022   Suncontrolled, goal A1c < 7,, pt to continue current medical treatment metfomrin 1000 bid, actos 30 mg qd as declines change today

## 2022-09-09 ENCOUNTER — Other Ambulatory Visit (INDEPENDENT_AMBULATORY_CARE_PROVIDER_SITE_OTHER): Payer: Medicare HMO

## 2022-09-09 DIAGNOSIS — E538 Deficiency of other specified B group vitamins: Secondary | ICD-10-CM

## 2022-09-09 DIAGNOSIS — E559 Vitamin D deficiency, unspecified: Secondary | ICD-10-CM | POA: Diagnosis not present

## 2022-09-09 DIAGNOSIS — E1165 Type 2 diabetes mellitus with hyperglycemia: Secondary | ICD-10-CM | POA: Diagnosis not present

## 2022-09-09 LAB — BASIC METABOLIC PANEL
BUN: 35 mg/dL — ABNORMAL HIGH (ref 6–23)
CO2: 30 mEq/L (ref 19–32)
Calcium: 9.7 mg/dL (ref 8.4–10.5)
Chloride: 99 mEq/L (ref 96–112)
Creatinine, Ser: 1.03 mg/dL (ref 0.40–1.20)
GFR: 51.58 mL/min — ABNORMAL LOW (ref >60.00)
Glucose, Bld: 102 mg/dL — ABNORMAL HIGH (ref 70–99)
Potassium: 3.8 mEq/L (ref 3.5–5.1)
Sodium: 139 mEq/L (ref 135–145)

## 2022-09-09 LAB — URINALYSIS, ROUTINE W REFLEX MICROSCOPIC
Bilirubin Urine: NEGATIVE
Hgb urine dipstick: NEGATIVE
Ketones, ur: NEGATIVE
Leukocytes,Ua: NEGATIVE
Nitrite: NEGATIVE
Specific Gravity, Urine: 1.025 (ref 1.000–1.030)
Urine Glucose: NEGATIVE
Urobilinogen, UA: 1 (ref 0.0–1.0)
pH: 6 (ref 5.0–8.0)

## 2022-09-09 LAB — LIPID PANEL
Cholesterol: 141 mg/dL (ref 0–200)
HDL: 57.7 mg/dL (ref >39.00)
LDL Cholesterol: 65 mg/dL (ref 0–99)
NonHDL: 83.59
Total CHOL/HDL Ratio: 2
Triglycerides: 91 mg/dL (ref 0.0–149.0)
VLDL: 18.2 mg/dL (ref 0.0–40.0)

## 2022-09-09 LAB — HEPATIC FUNCTION PANEL
ALT: 21 U/L (ref 0–35)
AST: 31 U/L (ref 0–37)
Albumin: 3.7 g/dL (ref 3.5–5.2)
Alkaline Phosphatase: 85 U/L (ref 39–117)
Bilirubin, Direct: 0.2 mg/dL (ref 0.0–0.3)
Total Bilirubin: 0.8 mg/dL (ref 0.2–1.2)
Total Protein: 6.6 g/dL (ref 6.0–8.3)

## 2022-09-09 LAB — CBC WITH DIFFERENTIAL/PLATELET
Basophils Absolute: 0 10*3/uL (ref 0.0–0.1)
Basophils Relative: 0.4 % (ref 0.0–3.0)
Eosinophils Absolute: 0.1 10*3/uL (ref 0.0–0.7)
Eosinophils Relative: 0.8 % (ref 0.0–5.0)
HCT: 30.7 % — ABNORMAL LOW (ref 36.0–46.0)
Hemoglobin: 10.1 g/dL — ABNORMAL LOW (ref 12.0–15.0)
Lymphocytes Relative: 16 % (ref 12.0–46.0)
Lymphs Abs: 1.5 10*3/uL (ref 0.7–4.0)
MCHC: 32.9 g/dL (ref 30.0–36.0)
MCV: 86.4 fl (ref 78.0–100.0)
Monocytes Absolute: 0.7 10*3/uL (ref 0.1–1.0)
Monocytes Relative: 7.9 % (ref 3.0–12.0)
Neutro Abs: 6.8 10*3/uL (ref 1.4–7.7)
Neutrophils Relative %: 74.9 % (ref 43.0–77.0)
Platelets: 236 10*3/uL (ref 150.0–400.0)
RBC: 3.55 Mil/uL — ABNORMAL LOW (ref 3.87–5.11)
RDW: 15.9 % — ABNORMAL HIGH (ref 11.5–15.5)
WBC: 9.1 10*3/uL (ref 4.0–10.5)

## 2022-09-09 LAB — MICROALBUMIN / CREATININE URINE RATIO
Creatinine,U: 115.2 mg/dL
Microalb Creat Ratio: 12 mg/g (ref 0.0–30.0)
Microalb, Ur: 13.8 mg/dL — ABNORMAL HIGH (ref 0.0–1.9)

## 2022-09-09 LAB — VITAMIN D 25 HYDROXY (VIT D DEFICIENCY, FRACTURES): VITD: 41.18 ng/mL (ref 30.00–100.00)

## 2022-09-09 LAB — VITAMIN B12: Vitamin B-12: 891 pg/mL (ref 211–911)

## 2022-09-09 LAB — TSH: TSH: 1.74 u[IU]/mL (ref 0.35–5.50)

## 2022-09-09 LAB — HEMOGLOBIN A1C: Hgb A1c MFr Bld: 8 % — ABNORMAL HIGH (ref 4.6–6.5)

## 2022-09-10 ENCOUNTER — Other Ambulatory Visit: Payer: Self-pay | Admitting: Internal Medicine

## 2022-09-10 MED ORDER — DAPAGLIFLOZIN PROPANEDIOL 5 MG PO TABS: 5.0000 mg | ORAL_TABLET | Freq: Every day | ORAL | 3 refills | Status: DC

## 2022-09-10 NOTE — Telephone Encounter (Signed)
Please refill as per office routine med refill policy (all routine meds to be refilled for 3 mo or monthly (per pt preference) up to one year from last visit, then month to month grace period for 3 mo, then further med refills will have to be denied) ? ?

## 2022-09-11 ENCOUNTER — Other Ambulatory Visit: Payer: Self-pay

## 2022-09-11 DIAGNOSIS — R1013 Epigastric pain: Secondary | ICD-10-CM

## 2022-09-11 DIAGNOSIS — K269 Duodenal ulcer, unspecified as acute or chronic, without hemorrhage or perforation: Secondary | ICD-10-CM

## 2022-09-11 DIAGNOSIS — K2970 Gastritis, unspecified, without bleeding: Secondary | ICD-10-CM

## 2022-09-11 MED ORDER — OMEPRAZOLE 40 MG PO CPDR: 40.0000 mg | DELAYED_RELEASE_CAPSULE | Freq: Every day | ORAL | 3 refills | Status: DC

## 2022-09-12 ENCOUNTER — Ambulatory Visit (INDEPENDENT_AMBULATORY_CARE_PROVIDER_SITE_OTHER): Payer: Medicare HMO | Admitting: Internal Medicine

## 2022-09-12 VITALS — BP 134/72 | HR 95 | Temp 98.3°F | Ht 64.0 in | Wt 215.0 lb

## 2022-09-12 DIAGNOSIS — N1831 Chronic kidney disease, stage 3a: Secondary | ICD-10-CM

## 2022-09-12 DIAGNOSIS — M5416 Radiculopathy, lumbar region: Secondary | ICD-10-CM

## 2022-09-12 DIAGNOSIS — I350 Nonrheumatic aortic (valve) stenosis: Secondary | ICD-10-CM

## 2022-09-12 DIAGNOSIS — E1165 Type 2 diabetes mellitus with hyperglycemia: Secondary | ICD-10-CM

## 2022-09-12 DIAGNOSIS — E039 Hypothyroidism, unspecified: Secondary | ICD-10-CM

## 2022-09-12 DIAGNOSIS — E538 Deficiency of other specified B group vitamins: Secondary | ICD-10-CM

## 2022-09-12 DIAGNOSIS — Z0001 Encounter for general adult medical examination with abnormal findings: Secondary | ICD-10-CM

## 2022-09-12 DIAGNOSIS — E559 Vitamin D deficiency, unspecified: Secondary | ICD-10-CM

## 2022-09-12 DIAGNOSIS — I10 Essential (primary) hypertension: Secondary | ICD-10-CM

## 2022-09-12 DIAGNOSIS — R051 Acute cough: Secondary | ICD-10-CM | POA: Diagnosis not present

## 2022-09-12 DIAGNOSIS — I700 Atherosclerosis of aorta: Secondary | ICD-10-CM

## 2022-09-12 DIAGNOSIS — E7800 Pure hypercholesterolemia, unspecified: Secondary | ICD-10-CM | POA: Diagnosis not present

## 2022-09-12 MED ORDER — ONETOUCH VERIO VI STRP: ORAL_STRIP | 12 refills | Status: DC

## 2022-09-12 NOTE — Progress Notes (Signed)
Patient ID: Kelsey Keller, female   DOB: [birth year], 82 y.o.   MRN: [id number]         Chief Complaint:: wellness exam and 14mofollow up (Follow up also for bronchitis ) and Surgery (Back surgery rescheduled)  , aortic atherosclerosis, mild AS, low b12, ckd3a, DM, htn, hld , low thryoid       HPI:  Kelsey LEEZERis a 82y.o. female here for wellness exam; declines covid booster, o/w up to date                        Also no falls recent.  Tolerating farxiga well. Pt denies chest pain, increased sob or doe, wheezing, orthopnea, PND, increased LE swelling, palpitations, dizziness or syncope.   Pt denies polydipsia, polyuria, or new focal neuro s/s.    Pt denies fever, wt loss, night sweats, loss of appetite, or other constitutional symptoms  Pt continues to have recurring left LBP without change in severity bur persisetn LLE pain, numbness, weakness, and no bowel or bladder change, fever, wt loss,  worseninh gait change or falls, for back surgury mar 18.  Also incidentally with cough and feverish last wk with head and chest congestion, now essentially resolved since yesterday   Wt Readings from Last 3 Encounters:  09/12/22 215 lb (97.5 kg)  09/03/22 215 lb (97.5 kg)  08/14/22 213 lb 12.8 oz (97 kg)   BP Readings from Last 3 Encounters:  09/12/22 134/72  09/03/22 138/76  08/14/22 (!) 157/64   Immunization History  Administered Date(s) Administered   Fluad Quad(high Dose 65+) 03/26/2021   Influenza, High Dose Seasonal PF 04/15/2018, 03/20/2019, 04/10/2020, 03/26/2021, 03/26/2022   Influenza-Unspecified 05/05/2017, 04/15/2018   PFIZER(Purple Top)SARS-COV-2 Vaccination 07/30/2019, 08/20/2019, 04/17/2020   PNEUMOCOCCAL CONJUGATE-20 12/11/2021   Pfizer Covid-19 Vaccine Bivalent Booster 146yr& up 05/16/2021   Pneumococcal Conjugate-13 04/15/2013   Pneumococcal Polysaccharide-23 04/27/2008   Rsv, Bivalent, Protein Subunit Rsvpref,pf (AEvans Lance01/09/2022   Td 10/25/2008   Td (Adult), 2 Lf  Tetanus Toxid, Preservative Free 10/25/2008   Tdap 01/26/2019   Unspecified SARS-COV-2 Vaccination 05/07/2022   Zoster Recombinat (Shingrix) 03/04/2022, 05/07/2022   Zoster, Live 10/20/2007   There are no preventive care reminders to display for this patient.     Past Medical History:  Diagnosis Date   ALLERGIC RHINITIS 04/29/2007   Allergy    Basal cell carcinoma 03/24/2018   sclerosis- upper bridge of nose (MOHS)   BCC (basal cell carcinoma) 10/18/2019   right supra orbital region- Cx3 [id number] Cataract    both eyes   DIABETES MELLITUS, TYPE II 06/23/2007   type 2   Elevated liver enzymes 03/2017   Elevated tumor markers    GERD (gastroesophageal reflux disease)    Heart murmur    per patient   HIATAL HERNIA, HX OF 04/29/2007   HYPERLIPIDEMIA 04/29/2007   diet controlled on CoQ10   HYPERTENSION 04/29/2007   HYPOKALEMIA 05/02/2009   HYPOTHYROIDISM 04/29/2007   LOW BACK PAIN 04/29/2007   [place]  diabetic neuropathy in both legs   OSTEOARTHRITIS, KNEE, LEFT 06/23/2007   OSTEOPOROSIS NOS 05/12/2007   SPINAL STENOSIS, LUMBAR 04/29/2007   Past Surgical History:  Procedure Laterality Date   BACK SURGERY N/A 2014 x 2   T1 TO S 1    CATARACT EXTRACTION W/ INTRAOCULAR LENS  IMPLANT, BILATERAL Bilateral 05/2011   doing well   COLONOSCOPY WITH PROPOFOL N/A 04/10/2017  Procedure: COLONOSCOPY WITH PROPOFOL;  Surgeon: [name], MD;  Location: [facility];  Service: Endoscopy;  Laterality: N/A;   COLONSCOPY  2011   TOTAL KNEE ARTHROPLASTY  2006   right   TOTAL KNEE ARTHROPLASTY Left 2012    reports that she has never smoked. She has never used smokeless tobacco. She reports current alcohol use. She reports that she does not use drugs. family history includes Arthritis in her brother and mother; COPD in her brother; Colon polyps in an other family member; Coronary artery disease in her father and another family member; Diabetes in an other family  member; Hyperlipidemia in her brother; Hypertension in her mother; Stroke in her brother. No Known Allergies Current Outpatient Medications on File Prior to Visit  Medication Sig Dispense Refill   acetaminophen (TYLENOL) 650 MG CR tablet Take 1,300 mg by mouth every 8 (eight) hours as needed for pain.     albuterol (VENTOLIN HFA) 108 (90 Base) MCG/ACT inhaler Inhale into the lungs.     aspirin 81 MG EC tablet Take 81 mg by mouth at bedtime.     atenolol (TENORMIN) 50 MG tablet TAKE 1 TABLET BY MOUTH EVERY DAY 90 tablet 1   blood glucose meter kit and supplies KIT Dispense with patient  Insurance; Use up to four times daily as directed. E11.9 1 each 0   Blood Glucose Monitoring Suppl (ONE TOUCH ULTRA 2) w/Device KIT Use as directed up to 4 times per day E11.9 1 each 0   Cholecalciferol (VITAMIN D3) 50 MCG (2000 UT) TABS Take 2,000 Units by mouth daily.     Co-Enzyme Q-10 100 MG CAPS Take 100 mg by mouth daily.     cyanocobalamin 1000 MCG tablet Take 1,000 mcg by mouth daily.     dapagliflozin propanediol (FARXIGA) 5 MG TABS tablet Take 1 tablet (5 mg total) by mouth daily before breakfast. 90 tablet 3   fluticasone (FLONASE) 50 MCG/ACT nasal spray SPRAY 2 SPRAYS INTO EACH NOSTRIL EVERY DAY (Patient taking differently: Place 2 sprays into both nostrils daily as needed for allergies.) 48 mL 3   hydrochlorothiazide (HYDRODIURIL) 25 MG tablet TAKE 1 TABLET BY MOUTH EVERY DAY 90 tablet 3   Lancets MISC Use as directed up to four times per day E11.9 200 each 11   levothyroxine (SYNTHROID) 125 MCG tablet TAKE 1 TABLET BY MOUTH EVERY DAY BEFORE BREAKFAST 90 tablet 1   Magnesium 250 MG TABS Take 250 mg by mouth daily.     meclizine (ANTIVERT) 12.5 MG tablet Take 1 tablet (12.5 mg total) by mouth 3 (three) times daily as needed for dizziness. 90 tablet 1   meloxicam (MOBIC) 7.5 MG tablet TAKE 1 TABLET BY MOUTH EVERY DAY AS NEEDED FOR PAIN 90 tablet 1   Menthol, Topical Analgesic, (BENGAY EX) Apply 1  Application topically daily as needed (arthritis pain).     metFORMIN (GLUCOPHAGE) 1000 MG tablet TAKE 1 TABLET BY MOUTH TWICE A DAY WITH FOOD 180 tablet 1   omeprazole (PRILOSEC) 40 MG capsule Take 1 capsule (40 mg total) by mouth daily. 90 capsule 3   pioglitazone (ACTOS) 30 MG tablet TAKE 1 TABLET BY MOUTH EVERY DAY 90 tablet 3   Polyethyl Glycol-Propyl Glycol (SYSTANE OP) Place 1 drop into both eyes daily as needed (dry eyes).     predniSONE (DELTASONE) 10 MG tablet 3 tabs by mouth per day for 3 days,2tabs per day for 3 days,1tab per day for 3 days 18 tablet 0  Red Yeast Rice Extract 600 MG CAPS Take 1,200 mg by mouth 2 (two) times daily.     tiZANidine (ZANAFLEX) 2 MG tablet TAKE 1 TABLET BY MOUTH AT BEDTIME AS NEEDED FOR MUSCLE SPASMS. 90 tablet 1   traMADol (ULTRAM) 50 MG tablet TAKE 1 TABLET BY MOUTH EVERY 8 HOURS AS NEEDED. 90 tablet 5   No current facility-administered medications on file prior to visit.        ROS:  All others reviewed and negative.  Objective        PE:  BP 134/72 (BP Location: Left Arm, Patient Position: Sitting, Cuff Size: Large)   Pulse 95   Temp 98.3 F (36.8 C) (Oral)   Ht '5\' 4"'$  (1.626 m)   Wt 215 lb (97.5 kg)   SpO2 96%   BMI 36.90 kg/m                 Constitutional: Pt appears in NAD               HENT: Head: NCAT.                Right Ear: External ear normal.                 Left Ear: External ear normal.                Eyes: . Pupils are equal, round, and reactive to light. Conjunctivae and EOM are normal               Nose: without d/c or deformity               Neck: Neck supple. Gross normal ROM               Cardiovascular: Normal rate and regular rhythm.  Gr 2/6 sys murmur LUSB               Pulmonary/Chest: Effort normal and breath sounds without rales or wheezing.                Abd:  Soft, NT, ND, + BS, no organomegaly               Neurological: Pt is alert. At baseline orientation, motor grossly intact               Skin: Skin is  warm. No rashes, no other new lesions, LE edema - trace bilateral               Psychiatric: Pt behavior is normal without agitation   Micro: none  Cardiac tracings I have personally interpreted today:  none  Pertinent Radiological findings (summarize): none   Lab Results  Component Value Date   WBC 9.1 09/09/2022   HGB 10.1 (L) 09/09/2022   HCT 30.7 (L) 09/09/2022   PLT 236.0 09/09/2022   GLUCOSE 102 (H) 09/09/2022   CHOL 141 09/09/2022   TRIG 91.0 09/09/2022   HDL 57.70 09/09/2022   LDLDIRECT 107.5 07/21/2013   LDLCALC 65 09/09/2022   ALT 21 09/09/2022   AST 31 09/09/2022   NA 139 09/09/2022   K 3.8 09/09/2022   CL 99 09/09/2022   CREATININE 1.03 09/09/2022   BUN 35 (H) 09/09/2022   CO2 30 09/09/2022   TSH 1.74 09/09/2022   INR 1.0 09/18/2021   HGBA1C 8.0 (H) 09/09/2022   MICROALBUR 13.8 (H) 09/09/2022   Assessment/Plan:  GEARLDINE BRUMMOND is a 82 y.o. White or Caucasian [1] female  with  has a past medical history of ALLERGIC RHINITIS (04/29/2007), Allergy, Basal cell carcinoma (03/24/2018), BCC (basal cell carcinoma) (10/18/2019), Cataract, DIABETES MELLITUS, TYPE II (06/23/2007), Elevated liver enzymes (03/2017), Elevated tumor markers, GERD (gastroesophageal reflux disease), Heart murmur, HIATAL HERNIA, HX OF (04/29/2007), HYPERLIPIDEMIA (04/29/2007), HYPERTENSION (04/29/2007), HYPOKALEMIA (05/02/2009), HYPOTHYROIDISM (04/29/2007), LOW BACK PAIN (04/29/2007), Neuromuscular disorder ([place]), OSTEOARTHRITIS, KNEE, LEFT (06/23/2007), OSTEOPOROSIS NOS (05/12/2007), and SPINAL STENOSIS, LUMBAR (04/29/2007).  Encounter for well adult exam with abnormal findings Age and sex appropriate education and counseling updated with regular exercise and diet Referrals for preventative services - none needed Immunizations addressed - declines covid booster Smoking counseling  - none needed Evidence for depression or other mood disorder - none significant Most recent labs reviewed. I have  personally reviewed and have noted: 1) the patient's medical and social history 2) The patient's current medications and supplements 3) The patient's height, weight, and BMI have been recorded in the chart   Aortic atherosclerosis (HCC) Pt to continue lower chol diet, exercise, declines statin for now  Aortic stenosis, mild Asympt, doing well, ok to continue to follow  B12 deficiency Lab Results  Component Value Date   VITAMINB12 891 09/09/2022   Stable, cont oral replacement - b12 1000 mcg qd   CKD (chronic kidney disease) stage 3, GFR 30-59 ml/min (HCC) Lab Results  Component Value Date   CREATININE 1.03 09/09/2022   Stable overall, cont to avoid nephrotoxins   Cough C/w recent bronchitis, now essentially resolved,  to f/u any worsening symptoms or concerns   Diabetes Lab Results  Component Value Date   HGBA1C 8.0 (H) 09/09/2022   uncontrolled, pt to continue current medical treatment metfomrin 1000 bid, actos 30 mg qd, but add farxiga 5 mg qd   Essential hypertension BP Readings from Last 3 Encounters:  09/12/22 134/72  09/03/22 138/76  08/14/22 (!) 157/64   Stable, pt to continue medical treatment tenormin 50 mg qd, hct 25 mg qd   Hyperlipidemia Lab Results  Component Value Date   LDLCALC 65 09/09/2022   Stable, pt to continue current DM diet, decliens statin   Hypothyroidism Lab Results  Component Value Date   TSH 1.74 09/09/2022   Stable, pt to continue levothyroxine 12 mcg qd   Left lumbar radiculopathy Ok for surgury mar 18  Vitamin D deficiency Last vitamin D Lab Results  Component Value Date   VD25OH 41.18 09/09/2022   Stable, cont oral replacement  Followup: Return in about 6 months (around 03/13/2023).  Cathlean Cower, MD 09/15/2022 3:25 PM [facility]

## 2022-09-12 NOTE — Patient Instructions (Addendum)
Please check with your insurance regarding the [place] coverage  Please continue all other medications as before, and refills have been done if requested - the one touch strips  Please have the pharmacy call with any other refills you may need.  Please continue your efforts at being more active, low cholesterol diet, and weight control.  You are otherwise up to date with prevention measures today.  Please keep your appointments with your specialists as you may have planned  Your lab work was otherwise ok  Please make an Appointment to return in 6 months, or sooner if needed, also with Lab Appointment for testing done 3-5 days before at the [facility] (so this is for TWO appointments - please see the scheduling desk as you leave)  Kelsey Alexanders with your Surgury!

## 2022-09-13 ENCOUNTER — Telehealth: Payer: Self-pay

## 2022-09-13 DIAGNOSIS — K7460 Unspecified cirrhosis of liver: Secondary | ICD-10-CM

## 2022-09-13 NOTE — Telephone Encounter (Signed)
-----   Message from Algernon Huxley, RN sent at 04/02/2022  9:38 AM EDT ----- Regarding: [place] abd-HCC screening Needs [place] of abd in 6 mth prior to OV that should be scheduled in March with dr Rush Landmark

## 2022-09-13 NOTE — Telephone Encounter (Signed)
Order entered and sent to the schedulers

## 2022-09-15 ENCOUNTER — Encounter: Payer: Self-pay | Admitting: Internal Medicine

## 2022-09-15 NOTE — Assessment & Plan Note (Signed)
[place] for [employer]

## 2022-09-15 NOTE — Assessment & Plan Note (Signed)
Asympt, doing well, ok to continue to follow

## 2022-09-15 NOTE — Assessment & Plan Note (Signed)
Last vitamin D Lab Results  Component Value Date   VD25OH 41.18 09/09/2022   Stable, cont oral replacement

## 2022-09-15 NOTE — Assessment & Plan Note (Signed)
C/w recent bronchitis, now essentially resolved,  to f/u any worsening symptoms or concerns

## 2022-09-15 NOTE — Assessment & Plan Note (Signed)
BP Readings from Last 3 Encounters:  09/12/22 134/72  09/03/22 138/76  08/14/22 (!) 157/64   Stable, pt to continue medical treatment tenormin 50 mg qd, hct 25 mg qd

## 2022-09-15 NOTE — Assessment & Plan Note (Signed)
Lab Results  Component Value Date   [id number] 09/09/2022   Stable, cont oral replacement - b12 1000 mcg qd

## 2022-09-15 NOTE — Assessment & Plan Note (Signed)
Lab Results  Component Value Date   LDLCALC 65 09/09/2022   Stable, pt to continue current DM diet, decliens statin

## 2022-09-15 NOTE — Assessment & Plan Note (Signed)
Pt to continue lower chol diet, exercise, declines statin for now

## 2022-09-15 NOTE — Assessment & Plan Note (Signed)
Lab Results  Component Value Date   HGBA1C 8.0 (H) 09/09/2022   uncontrolled, pt to continue current medical treatment metfomrin 1000 bid, actos 30 mg qd, but add farxiga 5 mg qd

## 2022-09-15 NOTE — Assessment & Plan Note (Signed)
Lab Results  Component Value Date   TSH 1.74 09/09/2022   Stable, pt to continue levothyroxine 12 mcg qd

## 2022-09-15 NOTE — Assessment & Plan Note (Signed)
Lab Results  Component Value Date   CREATININE 1.03 09/09/2022   Stable overall, cont to avoid nephrotoxins

## 2022-09-15 NOTE — Assessment & Plan Note (Signed)

## 2022-09-16 ENCOUNTER — Other Ambulatory Visit: Payer: Self-pay | Admitting: Neurological Surgery

## 2022-09-17 ENCOUNTER — Telehealth: Payer: Self-pay | Admitting: Internal Medicine

## 2022-09-17 ENCOUNTER — Ambulatory Visit (HOSPITAL_COMMUNITY)
Admission: RE | Admit: 2022-09-17 | Discharge: 2022-09-17 | Disposition: A | Discharge status: Home / Self Care | Payer: Medicare HMO | Source: Ambulatory Visit | Attending: Gastroenterology | Admitting: Gastroenterology

## 2022-09-17 DIAGNOSIS — K828 Other specified diseases of gallbladder: Secondary | ICD-10-CM | POA: Diagnosis not present

## 2022-09-17 DIAGNOSIS — K7460 Unspecified cirrhosis of liver: Secondary | ICD-10-CM | POA: Insufficient documentation

## 2022-09-17 MED ORDER — ONETOUCH VERIO VI STRP: ORAL_STRIP | 0 refills | Status: AC

## 2022-09-17 MED ORDER — LANCETS MISC: 0 refills | Status: AC

## 2022-09-17 NOTE — Telephone Encounter (Signed)
Notified pt will resend rx for twice a day.Marland Kitchen along w/ strips.Marland KitchenJohny Chess

## 2022-09-17 NOTE — Telephone Encounter (Signed)
Patient called and stated that her insurance will not cover her medication Lancets MISC for 4 times a day. Patient is wanting to know can it be switch to 1 or 2 times a day because she never checks it 4 times a day. Patient is requesting a new prescription be sent to her pharmacy on file  [id number] [address] . Best callback number is [phone]

## 2022-09-18 ENCOUNTER — Encounter: Payer: Self-pay | Admitting: Gastroenterology

## 2022-09-18 ENCOUNTER — Ambulatory Visit: Payer: Medicare HMO | Admitting: Gastroenterology

## 2022-09-18 VITALS — BP 130/76 | HR 100 | Ht 64.0 in | Wt 208.2 lb

## 2022-09-18 DIAGNOSIS — K754 Autoimmune hepatitis: Secondary | ICD-10-CM

## 2022-09-18 DIAGNOSIS — K7460 Unspecified cirrhosis of liver: Secondary | ICD-10-CM

## 2022-09-18 NOTE — Patient Instructions (Signed)
_______________________________________________________  If your blood pressure at your visit was 140/90 or greater, please contact your primary care physician to follow up on this.  _______________________________________________________  If you are age 80 or older, your body mass index should be between 23-30. Your Body mass index is 35.74 kg/m. If this is out of the aforementioned range listed, please consider follow up with your Primary Care Provider.  If you are age 8 or younger, your body mass index should be between 19-25. Your Body mass index is 35.74 kg/m. If this is out of the aformentioned range listed, please consider follow up with your Primary Care Provider.   ________________________________________________________  The  GI providers would like to encourage you to use [facility] to communicate with providers for non-urgent requests or questions.  Due to long hold times on the telephone, sending your provider a message by [facility] may be a faster and more efficient way to get a response.  Please allow 48 business hours for a response.  Please remember that this is for non-urgent requests.  _______________________________________________________  Follow up in 6 months. Office will call and schedule at a later time.   Thank you for choosing me and [facility].  Dr. Rush Landmark

## 2022-09-18 NOTE — Progress Notes (Unsigned)
[place] VISIT   Primary Care Provider Biagio Borg, MD [place] [address] [phone]  Patient Profile: Kelsey Keller is a 82 y.o. female with a pmh significant for hypertension, hyperlipidemia, hypothyroidism, diabetes, cataracts, skin cancers BCC, osteoarthritis, osteoporosis, spinal stenosis, cirrhosis (felt secondary to burned-out AIH without overt portal hypertension changes), history of PUD (DUs).  The patient presents to the [facility] for an evaluation and management of problem(s) noted below:  Problem List 1. Cirrhosis of liver without ascites, unspecified hepatic cirrhosis type ([place])   2. Autoimmune hepatitis ([place])     History of Present Illness Please see prior GI notes for full details of HPI.  Interval History The patient returns for follow-up.  She is a patient of Dr. Ardis Hughs who has been followed over the course of the last 6 years for abnormal LFTs that eventually led to diagnosis of cirrhosis based on biopsies and was felt to likely be secondary to burned-out AIH.  She has not been on prednisone therapy.  Back in 2018 when she had her last endoscopic evaluation there was no evidence of portal hypertension.  She has never been vaccinated against hepatitis A or hepatitis B and is not sure if she wants to have that currently.  She has a lot on her plate as she has a surgery coming up next few weeks on her back.  The patient denies any issues with jaundice, scleral icterus, generalized pruritus, darkened/amber urine, clay-colored stools, LE edema, hematemesis, coffee-ground emesis, abdominal distention, confusion.  She is not sure if she wanted to want to undergo repeat endoscopic evaluation but is willing to consider in the future.  Agree that she underwent a abdominal ultrasound yesterday with the results still pending at the time of clinic visit.  GI Review of Systems Positive as above Negative for  dysphagia, odynophagia, nausea, vomiting, pain, alteration of bowel habits, melena, hematochezia  Review of Systems General: Denies fevers/chills/weight loss unintentionally Cardiovascular: Denies chest pain Pulmonary: Denies shortness of breath Gastroenterological: See HPI Genitourinary: Denies darkened urine  Hematological: Denies easy bruising/bleeding Dermatological: Denies jaundice Psychological: Mood is stable   Medications Current Outpatient Medications  Medication Sig Dispense Refill   acetaminophen (TYLENOL) 650 MG CR tablet Take 1,300 mg by mouth every 8 (eight) hours as needed for pain.     aspirin 81 MG EC tablet Take 81 mg by mouth at bedtime.     atenolol (TENORMIN) 50 MG tablet TAKE 1 TABLET BY MOUTH EVERY DAY 90 tablet 1   blood glucose meter kit and supplies KIT Dispense with patient  Insurance; Use up to four times daily as directed. E11.9 1 each 0   Blood Glucose Monitoring Suppl (ONE TOUCH ULTRA 2) w/Device KIT Use as directed up to 4 times per day E11.9 1 each 0   Cholecalciferol (VITAMIN D3) 50 MCG (2000 UT) TABS Take 2,000 Units by mouth daily.     Co-Enzyme Q-10 100 MG CAPS Take 100 mg by mouth daily.     cyanocobalamin 1000 MCG tablet Take 1,000 mcg by mouth daily.     dapagliflozin propanediol (FARXIGA) 5 MG TABS tablet Take 1 tablet (5 mg total) by mouth daily before breakfast. 90 tablet 3   fluticasone (FLONASE) 50 MCG/ACT nasal spray SPRAY 2 SPRAYS INTO EACH NOSTRIL EVERY DAY (Patient taking differently: Place 2 sprays into both nostrils daily as needed for allergies.) 48 mL 3   glucose blood (ONETOUCH VERIO) test strip USE TO CHECK  BLOOD SUGARS TWICE A DAY 200 strip 0   hydrochlorothiazide (HYDRODIURIL) 25 MG tablet TAKE 1 TABLET BY MOUTH EVERY DAY 90 tablet 3   Lancets MISC Use to check blood sugars twice a day 200 each 0   levothyroxine (SYNTHROID) 125 MCG tablet TAKE 1 TABLET BY MOUTH EVERY DAY BEFORE BREAKFAST 90 tablet 1   Magnesium 250 MG TABS Take  250 mg by mouth daily.     meclizine (ANTIVERT) 12.5 MG tablet Take 1 tablet (12.5 mg total) by mouth 3 (three) times daily as needed for dizziness. 90 tablet 1   meloxicam (MOBIC) 7.5 MG tablet TAKE 1 TABLET BY MOUTH EVERY DAY AS NEEDED FOR PAIN 90 tablet 1   Menthol, Topical Analgesic, (BENGAY EX) Apply 1 Application topically daily as needed (arthritis pain).     metFORMIN (GLUCOPHAGE) 1000 MG tablet TAKE 1 TABLET BY MOUTH TWICE A DAY WITH FOOD 180 tablet 1   omeprazole (PRILOSEC) 40 MG capsule Take 1 capsule (40 mg total) by mouth daily. 90 capsule 3   pioglitazone (ACTOS) 30 MG tablet TAKE 1 TABLET BY MOUTH EVERY DAY 90 tablet 3   Polyethyl Glycol-Propyl Glycol (SYSTANE OP) Place 1 drop into both eyes daily as needed (dry eyes).     Red Yeast Rice Extract 600 MG CAPS Take 1,200 mg by mouth 2 (two) times daily.     tiZANidine (ZANAFLEX) 2 MG tablet TAKE 1 TABLET BY MOUTH AT BEDTIME AS NEEDED FOR MUSCLE SPASMS. 90 tablet 1   traMADol (ULTRAM) 50 MG tablet TAKE 1 TABLET BY MOUTH EVERY 8 HOURS AS NEEDED. 90 tablet 5   No current facility-administered medications for this visit.    Allergies No Known Allergies  Histories Past Medical History:  Diagnosis Date   ALLERGIC RHINITIS 04/29/2007   Allergy    Basal cell carcinoma 03/24/2018   sclerosis- upper bridge of nose (MOHS)   BCC (basal cell carcinoma) 10/18/2019   right supra orbital region- Cx3 16f   Cataract    both eyes   DIABETES MELLITUS, TYPE II 06/23/2007   type 2   Elevated liver enzymes 03/2017   Elevated tumor markers    GERD (gastroesophageal reflux disease)    Heart murmur    per patient   HIATAL HERNIA, HX OF 04/29/2007   HYPERLIPIDEMIA 04/29/2007   diet controlled on CoQ10   HYPERTENSION 04/29/2007   HYPOKALEMIA 05/02/2009   HYPOTHYROIDISM 04/29/2007   LOW BACK PAIN 04/29/2007   Neuromuscular disorder ([place]    diabetic neuropathy in both legs   OSTEOARTHRITIS, KNEE, LEFT 06/23/2007   OSTEOPOROSIS NOS  05/12/2007   SPINAL STENOSIS, LUMBAR 04/29/2007   Past Surgical History:  Procedure Laterality Date   BACK SURGERY N/A 2014 x 2   T1 TO S 1    CATARACT EXTRACTION W/ INTRAOCULAR LENS  IMPLANT, BILATERAL Bilateral 05/2011   doing well   COLONOSCOPY WITH PROPOFOL N/A 04/10/2017   Procedure: COLONOSCOPY WITH PROPOFOL;  Surgeon: JMilus Banister MD;  Location: [facility];  Service: Endoscopy;  Laterality: N/A;   COLONSCOPY  2011   TOTAL KNEE ARTHROPLASTY  2006   right   TOTAL KNEE ARTHROPLASTY Left 2012   Social History   Socioeconomic History   Marital status: Divorced    Spouse name: Not on file   Number of children: 1   Years of education: Not on file   Highest education level: Not on file  Occupational History   Occupation: teller at [employer]   Employer: RETIRED  Tobacco Use   Smoking status: Never   Smokeless tobacco: Never  Vaping Use   Vaping Use: Never used  Substance and Sexual Activity   Alcohol use: Yes    Alcohol/week: 0.0 standard drinks of alcohol    Comment: One or two drinks in month   Drug use: No   Sexual activity: Not Currently  Other Topics Concern   Not on file  Social History Narrative   Not on file   Social Determinants of Health   Financial Resource Strain: Low Risk  (04/26/2021)   Overall Financial Resource Strain (CARDIA)    Difficulty of Paying Living Expenses: Not hard at all  Food Insecurity: No Food Insecurity (04/26/2021)   Hunger Vital Sign    Worried About Running Out of Food in the Last Year: Never true    Ran Out of Food in the Last Year: Never true  Transportation Needs: No Transportation Needs (04/26/2021)   PRAPARE - Hydrologist (Medical): No    Lack of Transportation (Non-Medical): No  Physical Activity: Inactive (04/26/2021)   Exercise Vital Sign    Days of Exercise per Week: 0 days    Minutes of Exercise per Session: 0 min  Stress: No Stress Concern Present (04/26/2021)   [place]    Feeling of Stress : Not at all  Social Connections: Moderately Integrated (04/26/2021)   Social Connection and Isolation Panel [NHANES]    Frequency of Communication with Friends and Family: More than three times a week    Frequency of Social Gatherings with Friends and Family: More than three times a week    Attends Religious Services: More than 4 times per year    Active Member of [employer] or Organizations: Yes    Attends Archivist Meetings: More than 4 times per year    Marital Status: Widowed  Intimate Partner Violence: Not At Risk (04/26/2021)   Humiliation, Afraid, Rape, and Kick questionnaire    Fear of Current or Ex-Partner: No    Emotionally Abused: No    Physically Abused: No    Sexually Abused: No   Family History  Problem Relation Age of Onset   Arthritis Mother    Hypertension Mother    Coronary artery disease Father    Arthritis Brother    Stroke Brother    Hyperlipidemia Brother    COPD Brother    Coronary artery disease Other        Female 1st degree relative   Diabetes Other        1st degree relative   Colon polyps Other    Colon cancer Neg Hx    Esophageal cancer Neg Hx    Rectal cancer Neg Hx    Stomach cancer Neg Hx    Inflammatory bowel disease Neg Hx    Liver disease Neg Hx    Pancreatic cancer Neg Hx    I have reviewed her medical, social, and family history in detail and updated the electronic medical record as necessary.    PHYSICAL EXAMINATION  BP 130/76   Pulse 100   Ht '5\' 4"'$  (1.626 m)   Wt 208 lb 3.2 oz (94.4 kg)   BMI 35.74 kg/m  Wt Readings from Last 3 Encounters:  09/18/22 208 lb 3.2 oz (94.4 kg)  09/12/22 215 lb (97.5 kg)  09/03/22 215 lb (97.5 kg)  GEN: NAD, appears stated age, doesn't appear chronically ill PSYCH: Cooperative, without  pressured speech EYE: Conjunctivae pink, sclerae anicteric ENT: MMM CV: Nontachycardic RESP: No audible wheezing GI: NABS,  soft, NT/ND, without rebound MSK/EXT: Trace bilateral pedal edema SKIN: No jaundice NEURO:  Alert & Oriented x 3, no focal deficits   REVIEW OF DATA  I reviewed the following data at the time of this encounter:  GI Procedures and Studies  Reviewed 2018 EGD and colonoscopy reports  Laboratory Studies  Reviewed those in epic  Computed MELD 3.0 unavailable. Necessary lab results were not found in the last year. Computed MELD-Na unavailable. Necessary lab results were not found in the last year.  Imaging Studies  September 17, 2022 abdominal ultrasound Still pending read  September 2023 right upper quadrant ultrasound IMPRESSION: Morphologic changes compatible with cirrhosis.  No focal lesion   ASSESSMENT  Ms. Mclin is a 82 y.o. female with a pmh significant for hypertension, hyperlipidemia, hypothyroidism, diabetes, cataracts, skin cancers BCC, osteoarthritis, osteoporosis, spinal stenosis, cirrhosis (felt secondary to burned-out AIH without overt portal hypertension changes), history of PUD (DUs).  The patient is seen today for evaluation and management of:  1. Cirrhosis of liver without ascites, unspecified hepatic cirrhosis type ([place])   2. Autoimmune hepatitis ([place])    The patient is hemodynamically and clinically stable.  The patient's last set of liver biochemical tests done in February were normal.  Unfortunately there was no INR performed and has not had 1 in the last few months.  I cannot calculate her MELD score.  She is not currently interested in any additional laboratories at this time other than what she will need for her upcoming back surgery.  I would say that overall however she looks to be a Marcello Moores A cirrhotic as long as her INR is not greater than 2.3, which I would not expect based on her current clinical status.  I did recommend hepatitis A and hepatitis B vaccines but she is not interested at currently but may want to consider that in the future at follow-up.  She  is also overdue for portal hypertension evaluation with endoscopy, again she does not want to move forward with this currently but may consider this in follow-up.  I told her we would update her once the results of her ultrasound returned but hopefully did not show any evidence of [place].  I offered her to do an AFP as well but she did not want to have that laboratory performed currently.  I would recommend that we redo that at her follow-up [place] screening, pending her current ultrasound looks okay.  Will make no other medication adjustments or changes at this time.  Will be available for her as needed.  All patient questions were answered to the best of my ability, and the patient agrees to the aforementioned plan of action with follow-up as indicated.   PLAN  #ESLD Management Volume -None -Obtain standing weight at least 3 times weekly -1500-2000 mg Na diet Infection -No evidence of SBP on last imaging we will wait to see this current imaging but likely will not need prophylaxis Bleeding -Last EGD 2018 without varices so is overdue but patient does not want to pursue this currently we will continue to discuss in future Encephalopathy -None Screening -Follow-up yesterday's abdominal ultrasound Transplant -Based on her multiple medical admitted for disease and age, would not recommend transplant evaluation Vaccination -HAV Immunity, HBV Immunity is recommended and she will consider this at follow-up -Recommend yearly influenza -Recommend Pneumococcal vaccines Other -Recommended additional laboratories to complete MELD score  evaluation as well as AFP but patient deferred -Promote intake of 1.5 g/kg/day of Protein (Ensures/Boosts at each meal) -Continue PPI with history of DU and with patient still on NSAIDs   No orders of the defined types were placed in this encounter.   New Prescriptions   No medications on file   Modified Medications   No medications on file    Planned Follow  Up Return in about 6 months (around 03/21/2023).   Total Time in Face-to-Face and in Coordination of Care for patient including independent/personal interpretation/review of prior testing, medical history, examination, medication adjustment, communicating results with the patient directly, and documentation within the EHR is 25 minutes.   Justice Britain, MD [facility] Advanced Endoscopy Office # [phone]

## 2022-09-20 ENCOUNTER — Encounter: Payer: Self-pay | Admitting: Gastroenterology

## 2022-09-21 DIAGNOSIS — K7460 Unspecified cirrhosis of liver: Secondary | ICD-10-CM | POA: Insufficient documentation

## 2022-09-21 DIAGNOSIS — K754 Autoimmune hepatitis: Secondary | ICD-10-CM | POA: Insufficient documentation

## 2022-09-24 NOTE — Pre-Procedure Instructions (Signed)
Surgical Instructions    Your procedure is scheduled on September 30, 2022.  Report to [facility] Main Entrance "A" at 5:30 A.M., then check in with the Admitting office.  Call this number if you have problems the morning of surgery:  [phone]  If you have any questions prior to your surgery date call [phone]: Open Monday-Friday 8am-4pm If you experience any cold or flu symptoms such as cough, fever, chills, shortness of breath, etc. between now and your scheduled surgery, please notify us at the above number.     Remember:  Do not eat after midnight the night before your surgery  You may drink clear liquids until 4:30 AM the morning of your surgery.   Clear liquids allowed are: Water, Non-Citrus Juices (without pulp), Carbonated Beverages, Clear Tea, Black Coffee Only (NO MILK, CREAM OR POWDERED CREAMER of any kind), and Gatorade.     Take these medicines the morning of surgery with A SIP OF WATER:  atenolol (TENORMIN)   levothyroxine (SYNTHROID)     May take these medicines IF NEEDED:  acetaminophen (TYLENOL)   fluticasone (FLONASE) nasal spray   meclizine (ANTIVERT)   Polyethyl Glycol-Propyl Glycol (SYSTANE OP) eye drops  tiZANidine (ZANAFLEX)   traMADol (ULTRAM)    Follow your surgeon's instructions on when to stop Aspirin.  If no instructions were given by your surgeon then you will need to call the office to get those instructions.     As of today, STOP taking any Aleve, Naproxen, Ibuprofen, Motrin, Advil, Goody's, BC's, all herbal medications, fish oil, and all vitamins. This includes your medication: meloxicam (MOBIC)    WHAT DO I DO ABOUT MY DIABETES MEDICATION?   Do not take metFORMIN (GLUCOPHAGE) or pioglitazone (ACTOS) the morning of surgery.  STOP taking dapagliflozin propanediol (FARXIGA) three days prior to surgery. Your last dose of this medication will be March 14th.       HOW TO MANAGE YOUR DIABETES BEFORE AND AFTER SURGERY  Why is it important  to control my blood sugar before and after surgery? Improving blood sugar levels before and after surgery helps healing and can limit problems. A way of improving blood sugar control is eating a healthy diet by:  Eating less sugar and carbohydrates  Increasing activity/exercise  Talking with your doctor about reaching your blood sugar goals High blood sugars (greater than 180 mg/dL) can raise your risk of infections and slow your recovery, so you will need to focus on controlling your diabetes during the weeks before surgery. Make sure that the doctor who takes care of your diabetes knows about your planned surgery including the date and location.  How do I manage my blood sugar before surgery? Check your blood sugar at least 4 times a day, starting 2 days before surgery, to make sure that the level is not too high or low.  Check your blood sugar the morning of your surgery when you wake up and every 2 hours until you get to the Short Stay unit.  If your blood sugar is less than 70 mg/dL, you will need to treat for low blood sugar: Do not take insulin. Treat a low blood sugar (less than 70 mg/dL) with  cup of clear juice (cranberry or apple), 4 glucose tablets, OR glucose gel. Recheck blood sugar in 15 minutes after treatment (to make sure it is greater than 70 mg/dL). If your blood sugar is not greater than 70 mg/dL on recheck, call [phone] for further instructions. Report your blood sugar  to the short stay nurse when you get to Short Stay.  If you are admitted to the hospital after surgery: Your blood sugar will be checked by the staff and you will probably be given insulin after surgery (instead of oral diabetes medicines) to make sure you have good blood sugar levels. The goal for blood sugar control after surgery is 80-180 mg/dL.                      Do NOT Smoke (Tobacco/Vaping) for 24 hours prior to your procedure.  If you use a CPAP at night, you may bring your mask/headgear  for your overnight stay.   Contacts, glasses, piercing's, hearing aid's, dentures or partials may not be worn into surgery, please bring cases for these belongings.    For patients admitted to the hospital, discharge time will be determined by your treatment team.   Patients discharged the day of surgery will not be allowed to drive home, and someone needs to stay with them for 24 hours.  SURGICAL WAITING ROOM VISITATION Patients having surgery or a procedure may have no more than 2 support people in the waiting area - these visitors may rotate.   Children under the age of 40 must have an adult with them who is not the patient. If the patient needs to stay at the hospital during part of their recovery, the visitor guidelines for inpatient rooms apply. Pre-op nurse will coordinate an appropriate time for 1 support person to accompany patient in pre-op.  This support person may not rotate.   Please refer to the [facility] website for the visitor guidelines for Inpatients (after your surgery is over and you are in a regular room).    Special instructions:   [place]- Preparing For Surgery  Before surgery, you can play an important role. Because skin is not sterile, your skin needs to be as free of germs as possible. You can reduce the number of germs on your skin by washing with CHG (chlorahexidine gluconate) Soap before surgery.  CHG is an antiseptic cleaner which kills germs and bonds with the skin to continue killing germs even after washing.    Oral Hygiene is also important to reduce your risk of infection.  Remember - BRUSH YOUR TEETH THE MORNING OF SURGERY WITH YOUR REGULAR TOOTHPASTE  Please do not use if you have an allergy to CHG or antibacterial soaps. If your skin becomes reddened/irritated stop using the CHG.  Do not shave (including legs and underarms) for at least 48 hours prior to first CHG shower. It is OK to shave your face.  Please follow these instructions  carefully.   Shower the NIGHT BEFORE SURGERY and the MORNING OF SURGERY  If you chose to wash your hair, wash your hair first as usual with your normal shampoo.  After you shampoo, rinse your hair and body thoroughly to remove the shampoo.  Use CHG Soap as you would any other liquid soap. You can apply CHG directly to the skin and wash gently with a scrungie or a clean washcloth.   Apply the CHG Soap to your body ONLY FROM THE NECK DOWN.  Do not use on open wounds or open sores. Avoid contact with your eyes, ears, mouth and genitals (private parts). Wash Face and genitals (private parts)  with your normal soap.   Wash thoroughly, paying special attention to the area where your surgery will be performed.  Thoroughly rinse your body with warm water  from the neck down.  DO NOT shower/wash with your normal soap after using and rinsing off the CHG Soap.  Pat yourself dry with a CLEAN TOWEL.  Wear CLEAN PAJAMAS to bed the night before surgery  Place CLEAN SHEETS on your bed the night before your surgery  DO NOT SLEEP WITH PETS.   Day of Surgery: Take a shower with CHG soap. Do not wear jewelry or makeup Do not wear lotions, powders, perfumes/colognes, or deodorant. Do not shave 48 hours prior to surgery.  Men may shave face and neck. Do not bring valuables to the hospital.  [facility] is not responsible for any belongings or valuables. Do not wear nail polish, gel polish, artificial nails, or any other type of covering on natural nails (fingers and toes) If you have artificial nails or gel coating that need to be removed by a nail salon, please have this removed prior to surgery. Artificial nails or gel coating may interfere with anesthesia's ability to adequately monitor your vital signs.  Wear Clean/Comfortable clothing the morning of surgery Remember to brush your teeth WITH YOUR REGULAR TOOTHPASTE.   Please read over the following fact sheets that you were given.    If you  received a COVID test during your pre-op visit  it is requested that you wear a mask when out in public, stay away from anyone that may not be feeling well and notify your surgeon if you develop symptoms. If you have been in contact with anyone that has tested positive in the last 10 days please notify you surgeon.

## 2022-09-25 ENCOUNTER — Encounter (HOSPITAL_COMMUNITY): Payer: Self-pay

## 2022-09-25 ENCOUNTER — Other Ambulatory Visit: Payer: Self-pay

## 2022-09-25 ENCOUNTER — Encounter (HOSPITAL_COMMUNITY)
Admission: RE | Admit: 2022-09-25 | Discharge: 2022-09-25 | Disposition: A | Discharge status: Home / Self Care | Payer: Medicare HMO | Source: Ambulatory Visit | Attending: Neurological Surgery | Admitting: Neurological Surgery

## 2022-09-25 VITALS — BP 138/69 | HR 91 | Temp 98.1°F | Resp 18 | Ht 64.0 in | Wt 207.0 lb

## 2022-09-25 DIAGNOSIS — Z01812 Encounter for preprocedural laboratory examination: Secondary | ICD-10-CM | POA: Insufficient documentation

## 2022-09-25 DIAGNOSIS — Z01818 Encounter for other preprocedural examination: Secondary | ICD-10-CM

## 2022-09-25 HISTORY — DX: Personal history of other diseases of the digestive system: Z87.19

## 2022-09-25 LAB — GLUCOSE, CAPILLARY: Glucose-Capillary: 183 mg/dL — ABNORMAL HIGH (ref 70–99)

## 2022-09-25 LAB — SURGICAL PCR SCREEN
MRSA, PCR: NEGATIVE
Staphylococcus aureus: NEGATIVE

## 2022-09-25 NOTE — Progress Notes (Signed)
PCP - Cathlean Cower MD Cardiologist - Denies  PPM/ICD - denies Device Orders -  Rep Notified -   Chest x-ray - 11/15/20 EKG - 08/14/22 Stress Test - 10/29/10 ECHO - 08/14/22 Cardiac Cath - denies  Sleep Study - denies CPAP -   Fasting Blood Sugar - 160's Checks Blood Sugar one times a day  Last dose of GLP1 agonist-  na GLP1 instructions:   Blood Thinner Instructions:na Aspirin Instructions:Pt stopped Aspirin 09/23/22 per Dr. Clarice Pole instructions.  ERAS Protcol - clear liquids until 0430 PRE-SURGERY Ensure or G2- no  COVID TEST- na   Anesthesia review: yes-treatment for bronchitis in February of 2024. No hospitalization. Pt seen at PAT appt by Karoline Caldwell PA.  Patient denies shortness of breath, fever, cough and chest pain at PAT appointment   All instructions explained to the patient, with a verbal understanding of the material. Patient agrees to go over the instructions while at home for a better understanding. Patient also instructed to wear a mask when out in public prior to surgery.. The opportunity to ask questions was provided.

## 2022-09-26 NOTE — Anesthesia Preprocedure Evaluation (Addendum)
Anesthesia Evaluation    Reviewed: Allergy & Precautions, Patient's Chart, lab work & pertinent test results  Airway Mallampati: II  TM Distance: >3 FB Neck ROM: Full    Dental no notable dental hx. (+) Teeth Intact, Dental Advisory Given   Pulmonary asthma  Recent BronChitis   Pulmonary exam normal breath sounds clear to auscultation       Cardiovascular hypertension, Normal cardiovascular exam+ Valvular Problems/Murmurs AS  Rhythm:Regular Rate:Normal  08/14/2022 TTE  1. Left ventricular ejection fraction, by estimation, is 60 to 65%. The  left ventricle has normal function. The left ventricle has no regional  wall motion abnormalities. Left ventricular diastolic parameters are  consistent with Grade II diastolic  dysfunction (pseudonormalization).   2. Right ventricular systolic function is normal. The right ventricular  size is normal. There is normal pulmonary artery systolic pressure.   3. Left atrial size was moderately dilated.   4. Right atrial size was mildly dilated.   5. The mitral valve is normal in structure. Trivial mitral valve  regurgitation. No evidence of mitral stenosis.   6. The aortic valve is calcified. There is moderate calcification of the  aortic valve. There is moderate thickening of the aortic valve. Aortic  valve regurgitation is trivial. Mild aortic valve stenosis. Aortic valve  mean gradient measures 16.3 mmHg.   7. The inferior vena cava is normal in size with greater than 50%  respiratory variability, suggesting right atrial pressure of 3 mmHg.      Neuro/Psych  Neuromuscular disease    GI/Hepatic ,GERD  ,,  Endo/Other  diabetesHypothyroidism    Renal/GU Lab Results      Component                Value               Date                      CREATININE               1.03                09/09/2022                 K                        3.8                 09/09/2022                    Musculoskeletal  (+) Arthritis ,    Abdominal  (+) + obese  Peds  Hematology  (+) Blood dyscrasia, anemia Lab Results      Component                Value               Date                      WBC                      9.1                 09/09/2022                HGB  10.1 (L)            09/09/2022                HCT                      30.7 (L)            09/09/2022                PLT                      236.0               09/09/2022     T & S available           Anesthesia Other Findings   Reproductive/Obstetrics                             Anesthesia Physical Anesthesia Plan  ASA: 3  Anesthesia Plan: General   Post-op Pain Management: Ketamine IV*, Ofirmev IV (intra-op)* and Lidocaine infusion*   Induction: Intravenous  PONV Risk Score and Plan: Treatment may vary due to age or medical condition and Ondansetron  Airway Management Planned: Oral ETT  Additional Equipment: Arterial line  Intra-op Plan:   Post-operative Plan: Extubation in OR  Informed Consent: I have reviewed the patients History and Physical, chart, labs and discussed the procedure including the risks, benefits and alternatives for the proposed anesthesia with the patient or authorized representative who has indicated his/her understanding and acceptance.     Dental advisory given  Plan Discussed with:   Anesthesia Plan Comments: (See recent PAT note by Karoline Caldwell, PA-C 08/15/22. Surgery delayed at that time due to insurance denial. In the interim pt was seen by PCP on 2/20 and treated for bronchitis with antibiotics, steroids, albuterol. Symptoms resolved by followup on 2/29. She was asymptomatic at PAT appt 09/25/22. No SOB. No longer using albuterol. Lungs CTAB.   )        Anesthesia Quick Evaluation

## 2022-09-30 ENCOUNTER — Other Ambulatory Visit: Payer: Self-pay

## 2022-09-30 ENCOUNTER — Inpatient Hospital Stay (HOSPITAL_COMMUNITY)
Admission: RE | Admit: 2022-09-30 | Discharge: 2022-10-03 | DRG: 460 | Disposition: A | Discharge status: Home Health | Payer: Medicare HMO | Attending: Neurological Surgery | Admitting: Neurological Surgery

## 2022-09-30 ENCOUNTER — Inpatient Hospital Stay (HOSPITAL_COMMUNITY): Payer: Medicare HMO

## 2022-09-30 ENCOUNTER — Inpatient Hospital Stay (HOSPITAL_COMMUNITY): Payer: Medicare HMO | Admitting: Emergency Medicine

## 2022-09-30 ENCOUNTER — Inpatient Hospital Stay (HOSPITAL_COMMUNITY)
Admission: RE | Disposition: A | Discharge status: Home Health | Payer: Self-pay | Source: Home / Self Care | Attending: Neurological Surgery

## 2022-09-30 ENCOUNTER — Encounter (HOSPITAL_COMMUNITY): Payer: Self-pay | Admitting: Neurological Surgery

## 2022-09-30 DIAGNOSIS — Z7901 Long term (current) use of anticoagulants: Secondary | ICD-10-CM | POA: Diagnosis not present

## 2022-09-30 DIAGNOSIS — G992 Myelopathy in diseases classified elsewhere: Secondary | ICD-10-CM | POA: Diagnosis not present

## 2022-09-30 DIAGNOSIS — E039 Hypothyroidism, unspecified: Secondary | ICD-10-CM | POA: Diagnosis not present

## 2022-09-30 DIAGNOSIS — Z79899 Other long term (current) drug therapy: Secondary | ICD-10-CM

## 2022-09-30 DIAGNOSIS — Z79890 Hormone replacement therapy (postmenopausal): Secondary | ICD-10-CM | POA: Diagnosis not present

## 2022-09-30 DIAGNOSIS — E785 Hyperlipidemia, unspecified: Secondary | ICD-10-CM | POA: Diagnosis present

## 2022-09-30 DIAGNOSIS — I4891 Unspecified atrial fibrillation: Secondary | ICD-10-CM | POA: Diagnosis not present

## 2022-09-30 DIAGNOSIS — J309 Allergic rhinitis, unspecified: Secondary | ICD-10-CM | POA: Diagnosis present

## 2022-09-30 DIAGNOSIS — M4324 Fusion of spine, thoracic region: Secondary | ICD-10-CM | POA: Diagnosis not present

## 2022-09-30 DIAGNOSIS — Z8249 Family history of ischemic heart disease and other diseases of the circulatory system: Secondary | ICD-10-CM

## 2022-09-30 DIAGNOSIS — Z8673 Personal history of transient ischemic attack (TIA), and cerebral infarction without residual deficits: Secondary | ICD-10-CM | POA: Diagnosis not present

## 2022-09-30 DIAGNOSIS — Z85828 Personal history of other malignant neoplasm of skin: Secondary | ICD-10-CM

## 2022-09-30 DIAGNOSIS — Z9842 Cataract extraction status, left eye: Secondary | ICD-10-CM

## 2022-09-30 DIAGNOSIS — Z7984 Long term (current) use of oral hypoglycemic drugs: Secondary | ICD-10-CM

## 2022-09-30 DIAGNOSIS — Z981 Arthrodesis status: Secondary | ICD-10-CM | POA: Diagnosis not present

## 2022-09-30 DIAGNOSIS — I350 Nonrheumatic aortic (valve) stenosis: Secondary | ICD-10-CM | POA: Diagnosis not present

## 2022-09-30 DIAGNOSIS — K219 Gastro-esophageal reflux disease without esophagitis: Secondary | ICD-10-CM | POA: Diagnosis not present

## 2022-09-30 DIAGNOSIS — E1140 Type 2 diabetes mellitus with diabetic neuropathy, unspecified: Secondary | ICD-10-CM | POA: Diagnosis present

## 2022-09-30 DIAGNOSIS — M5414 Radiculopathy, thoracic region: Secondary | ICD-10-CM | POA: Diagnosis not present

## 2022-09-30 DIAGNOSIS — Z83438 Family history of other disorder of lipoprotein metabolism and other lipidemia: Secondary | ICD-10-CM | POA: Diagnosis not present

## 2022-09-30 DIAGNOSIS — I10 Essential (primary) hypertension: Secondary | ICD-10-CM | POA: Diagnosis not present

## 2022-09-30 DIAGNOSIS — M5416 Radiculopathy, lumbar region: Secondary | ICD-10-CM | POA: Diagnosis present

## 2022-09-30 DIAGNOSIS — T84296A Other mechanical complication of internal fixation device of vertebrae, initial encounter: Secondary | ICD-10-CM | POA: Diagnosis not present

## 2022-09-30 DIAGNOSIS — I4819 Other persistent atrial fibrillation: Secondary | ICD-10-CM | POA: Diagnosis not present

## 2022-09-30 DIAGNOSIS — M48061 Spinal stenosis, lumbar region without neurogenic claudication: Secondary | ICD-10-CM | POA: Diagnosis present

## 2022-09-30 DIAGNOSIS — M960 Pseudarthrosis after fusion or arthrodesis: Principal | ICD-10-CM | POA: Diagnosis present

## 2022-09-30 DIAGNOSIS — Y838 Other surgical procedures as the cause of abnormal reaction of the patient, or of later complication, without mention of misadventure at the time of the procedure: Secondary | ICD-10-CM | POA: Diagnosis present

## 2022-09-30 DIAGNOSIS — Z833 Family history of diabetes mellitus: Secondary | ICD-10-CM

## 2022-09-30 DIAGNOSIS — Z7982 Long term (current) use of aspirin: Secondary | ICD-10-CM

## 2022-09-30 DIAGNOSIS — Z96653 Presence of artificial knee joint, bilateral: Secondary | ICD-10-CM | POA: Diagnosis not present

## 2022-09-30 DIAGNOSIS — Z961 Presence of intraocular lens: Secondary | ICD-10-CM | POA: Diagnosis not present

## 2022-09-30 DIAGNOSIS — Z9841 Cataract extraction status, right eye: Secondary | ICD-10-CM

## 2022-09-30 DIAGNOSIS — M4804 Spinal stenosis, thoracic region: Secondary | ICD-10-CM | POA: Diagnosis not present

## 2022-09-30 DIAGNOSIS — M810 Age-related osteoporosis without current pathological fracture: Secondary | ICD-10-CM | POA: Diagnosis present

## 2022-09-30 DIAGNOSIS — D649 Anemia, unspecified: Secondary | ICD-10-CM | POA: Diagnosis not present

## 2022-09-30 DIAGNOSIS — G959 Disease of spinal cord, unspecified: Secondary | ICD-10-CM

## 2022-09-30 DIAGNOSIS — D62 Acute posthemorrhagic anemia: Secondary | ICD-10-CM | POA: Diagnosis not present

## 2022-09-30 DIAGNOSIS — T84226A Displacement of internal fixation device of vertebrae, initial encounter: Secondary | ICD-10-CM | POA: Diagnosis not present

## 2022-09-30 DIAGNOSIS — J45909 Unspecified asthma, uncomplicated: Secondary | ICD-10-CM | POA: Diagnosis not present

## 2022-09-30 DIAGNOSIS — E1149 Type 2 diabetes mellitus with other diabetic neurological complication: Secondary | ICD-10-CM | POA: Diagnosis not present

## 2022-09-30 LAB — GLUCOSE, CAPILLARY
Glucose-Capillary: 105 mg/dL — ABNORMAL HIGH (ref 70–99)
Glucose-Capillary: 90 mg/dL (ref 70–99)
Glucose-Capillary: 129 mg/dL — ABNORMAL HIGH (ref 70–99)
Glucose-Capillary: 168 mg/dL — ABNORMAL HIGH (ref 70–99)
Glucose-Capillary: 200 mg/dL — ABNORMAL HIGH (ref 70–99)
Glucose-Capillary: 206 mg/dL — ABNORMAL HIGH (ref 70–99)
Glucose-Capillary: 228 mg/dL — ABNORMAL HIGH (ref 70–99)
Glucose-Capillary: 268 mg/dL — ABNORMAL HIGH (ref 70–99)
Glucose-Capillary: 293 mg/dL — ABNORMAL HIGH (ref 70–99)

## 2022-09-30 LAB — CBC
HCT: 26 % — ABNORMAL LOW (ref 36.0–46.0)
Hemoglobin: 7.8 g/dL — ABNORMAL LOW (ref 12.0–15.0)
MCH: 26.9 pg (ref 26.0–34.0)
MCHC: 30 g/dL (ref 30.0–36.0)
MCV: 89.7 fL (ref 80.0–100.0)
Platelets: 119 10*3/uL — ABNORMAL LOW (ref 150–400)
RBC: 2.9 MIL/uL — ABNORMAL LOW (ref 3.87–5.11)
RDW: 15.1 % (ref 11.5–15.5)
WBC: 6.5 10*3/uL (ref 4.0–10.5)
nRBC: 0 % (ref 0.0–0.2)

## 2022-09-30 LAB — BASIC METABOLIC PANEL
Anion gap: 7 (ref 5–15)
BUN: 32 mg/dL — ABNORMAL HIGH (ref 8–23)
CO2: 24 mmol/L (ref 22–32)
Calcium: 9 mg/dL (ref 8.9–10.3)
Chloride: 103 mmol/L (ref 98–111)
Creatinine, Ser: 1.07 mg/dL — ABNORMAL HIGH (ref 0.44–1.00)
GFR, Estimated: 53 mL/min — ABNORMAL LOW (ref >60)
Glucose, Bld: 274 mg/dL — ABNORMAL HIGH (ref 70–99)
Potassium: 5 mmol/L (ref 3.5–5.1)
Sodium: 134 mmol/L — ABNORMAL LOW (ref 135–145)

## 2022-09-30 SURGERY — POSTERIOR LUMBAR FUSION 2 WITH HARDWARE REMOVAL
Anesthesia: General | Site: Back

## 2022-09-30 MED ORDER — LIDOCAINE 2% (20 MG/ML) 5 ML SYRINGE
INTRAMUSCULAR | Status: AC
  Filled 2022-09-30: qty 5

## 2022-09-30 MED ORDER — ONDANSETRON HCL 4 MG/2ML IJ SOLN
INTRAMUSCULAR | Status: DC | PRN
  Administered 2022-09-30: 4 mg via INTRAVENOUS

## 2022-09-30 MED ORDER — LIDOCAINE-EPINEPHRINE 1 %-1:100000 IJ SOLN
INTRAMUSCULAR | Status: AC
  Filled 2022-09-30: qty 1

## 2022-09-30 MED ORDER — ONDANSETRON HCL 4 MG PO TABS: 4.0000 mg | ORAL_TABLET | Freq: Four times a day (QID) | ORAL | Status: DC | PRN

## 2022-09-30 MED ORDER — ROCURONIUM BROMIDE 10 MG/ML (PF) SYRINGE
PREFILLED_SYRINGE | INTRAVENOUS | Status: DC | PRN
  Administered 2022-09-30 (×3): 20 mg via INTRAVENOUS
  Administered 2022-09-30: 60 mg via INTRAVENOUS

## 2022-09-30 MED ORDER — ATENOLOL 50 MG PO TABS
50.0000 mg | ORAL_TABLET | Freq: Every day | ORAL | Status: DC
  Administered 2022-10-01 – 2022-10-03 (×3): 50 mg via ORAL
  Filled 2022-09-30 (×3): qty 1

## 2022-09-30 MED ORDER — SODIUM CHLORIDE 0.9% FLUSH
3.0000 mL | Freq: Two times a day (BID) | INTRAVENOUS | Status: DC
  Administered 2022-10-01 – 2022-10-03 (×4): 3 mL via INTRAVENOUS

## 2022-09-30 MED ORDER — PANTOPRAZOLE SODIUM 40 MG PO TBEC
40.0000 mg | DELAYED_RELEASE_TABLET | Freq: Two times a day (BID) | ORAL | Status: DC
  Administered 2022-09-30 – 2022-10-03 (×6): 40 mg via ORAL
  Filled 2022-09-30 (×6): qty 1

## 2022-09-30 MED ORDER — CHLORHEXIDINE GLUCONATE 0.12 % MT SOLN
OROMUCOSAL | Status: AC
  Administered 2022-09-30: 15 mL via OROMUCOSAL
  Filled 2022-09-30: qty 15

## 2022-09-30 MED ORDER — THROMBIN 5000 UNITS EX SOLR
CUTANEOUS | Status: AC
  Filled 2022-09-30: qty 5000

## 2022-09-30 MED ORDER — KETAMINE HCL 10 MG/ML IJ SOLN
INTRAMUSCULAR | Status: DC | PRN
  Administered 2022-09-30: 50 mg via INTRAVENOUS

## 2022-09-30 MED ORDER — ACETAMINOPHEN 325 MG PO TABS: 650.0000 mg | ORAL_TABLET | ORAL | Status: DC | PRN

## 2022-09-30 MED ORDER — ACETAMINOPHEN 650 MG RE SUPP: 650.0000 mg | RECTAL | Status: DC | PRN

## 2022-09-30 MED ORDER — FENTANYL CITRATE (PF) 250 MCG/5ML IJ SOLN
INTRAMUSCULAR | Status: AC
  Filled 2022-09-30: qty 5

## 2022-09-30 MED ORDER — LIDOCAINE-EPINEPHRINE 1 %-1:100000 IJ SOLN
INTRAMUSCULAR | Status: DC | PRN
  Administered 2022-09-30: 10 mL

## 2022-09-30 MED ORDER — LEVOTHYROXINE SODIUM 25 MCG PO TABS
125.0000 ug | ORAL_TABLET | Freq: Every day | ORAL | Status: DC
  Administered 2022-10-01 – 2022-10-03 (×3): 125 ug via ORAL
  Filled 2022-09-30 (×3): qty 1

## 2022-09-30 MED ORDER — CHLORHEXIDINE GLUCONATE CLOTH 2 % EX PADS: 6.0000 | MEDICATED_PAD | Freq: Once | CUTANEOUS | Status: DC

## 2022-09-30 MED ORDER — POLYVINYL ALCOHOL 1.4 % OP SOLN: Freq: Every day | OPHTHALMIC | Status: DC | PRN

## 2022-09-30 MED ORDER — MORPHINE SULFATE (PF) 2 MG/ML IV SOLN
2.0000 mg | INTRAVENOUS | Status: DC | PRN
  Administered 2022-09-30 – 2022-10-01 (×2): 2 mg via INTRAVENOUS
  Filled 2022-09-30 (×2): qty 1

## 2022-09-30 MED ORDER — OXYCODONE-ACETAMINOPHEN 5-325 MG PO TABS
1.0000 | ORAL_TABLET | Freq: Four times a day (QID) | ORAL | Status: DC | PRN
  Administered 2022-10-01 (×2): 1 via ORAL
  Administered 2022-10-01 – 2022-10-03 (×7): 2 via ORAL
  Filled 2022-09-30: qty 1
  Filled 2022-09-30 (×4): qty 2
  Filled 2022-09-30: qty 1
  Filled 2022-09-30 (×4): qty 2

## 2022-09-30 MED ORDER — PHENOL 1.4 % MT LIQD: 1.0000 | OROMUCOSAL | Status: DC | PRN

## 2022-09-30 MED ORDER — BUPIVACAINE HCL (PF) 0.5 % IJ SOLN
INTRAMUSCULAR | Status: DC | PRN
  Administered 2022-09-30: 10 mL

## 2022-09-30 MED ORDER — LIDOCAINE 2% (20 MG/ML) 5 ML SYRINGE
INTRAMUSCULAR | Status: DC | PRN
  Administered 2022-09-30: 80 mg via INTRAVENOUS

## 2022-09-30 MED ORDER — ORAL CARE MOUTH RINSE: 15.0000 mL | Freq: Once | OROMUCOSAL | Status: AC

## 2022-09-30 MED ORDER — ONDANSETRON HCL 4 MG/2ML IJ SOLN
INTRAMUSCULAR | Status: AC
  Filled 2022-09-30: qty 2

## 2022-09-30 MED ORDER — LIDOCAINE IN D5W 4-5 MG/ML-% IV SOLN
INTRAVENOUS | Status: AC
  Filled 2022-09-30: qty 500

## 2022-09-30 MED ORDER — PHENYLEPHRINE HCL-NACL 20-0.9 MG/250ML-% IV SOLN
INTRAVENOUS | Status: DC | PRN
  Administered 2022-09-30: 50 ug/min via INTRAVENOUS

## 2022-09-30 MED ORDER — PROPOFOL 10 MG/ML IV BOLUS
INTRAVENOUS | Status: AC
  Filled 2022-09-30: qty 20

## 2022-09-30 MED ORDER — PIOGLITAZONE HCL 30 MG PO TABS
30.0000 mg | ORAL_TABLET | Freq: Every day | ORAL | Status: DC
  Administered 2022-09-30 – 2022-10-03 (×4): 30 mg via ORAL
  Filled 2022-09-30 (×4): qty 1

## 2022-09-30 MED ORDER — FLUTICASONE PROPIONATE 50 MCG/ACT NA SUSP: 2.0000 | Freq: Every day | NASAL | Status: DC | PRN

## 2022-09-30 MED ORDER — BISACODYL 10 MG RE SUPP: 10.0000 mg | Freq: Every day | RECTAL | Status: DC | PRN

## 2022-09-30 MED ORDER — CHLORHEXIDINE GLUCONATE 0.12 % MT SOLN: 15.0000 mL | Freq: Once | OROMUCOSAL | Status: AC

## 2022-09-30 MED ORDER — PROPOFOL 10 MG/ML IV BOLUS
INTRAVENOUS | Status: DC | PRN
  Administered 2022-09-30: 120 mg via INTRAVENOUS

## 2022-09-30 MED ORDER — CEFAZOLIN SODIUM 1 G IJ SOLR
INTRAMUSCULAR | Status: AC
  Filled 2022-09-30: qty 20

## 2022-09-30 MED ORDER — 0.9 % SODIUM CHLORIDE (POUR BTL) OPTIME
TOPICAL | Status: DC | PRN
  Administered 2022-09-30: 1000 mL

## 2022-09-30 MED ORDER — HYDROMORPHONE HCL 1 MG/ML IJ SOLN
INTRAMUSCULAR | Status: AC
  Filled 2022-09-30: qty 1

## 2022-09-30 MED ORDER — ONDANSETRON HCL 4 MG/2ML IJ SOLN
4.0000 mg | Freq: Four times a day (QID) | INTRAMUSCULAR | Status: DC | PRN
  Administered 2022-10-01: 4 mg via INTRAVENOUS
  Filled 2022-09-30: qty 2

## 2022-09-30 MED ORDER — DOCUSATE SODIUM 100 MG PO CAPS
100.0000 mg | ORAL_CAPSULE | Freq: Two times a day (BID) | ORAL | Status: DC
  Administered 2022-09-30 – 2022-10-03 (×5): 100 mg via ORAL
  Filled 2022-09-30 (×5): qty 1

## 2022-09-30 MED ORDER — SODIUM CHLORIDE 0.9 % IV SOLN: 250.0000 mL | INTRAVENOUS | Status: DC

## 2022-09-30 MED ORDER — DAPAGLIFLOZIN PROPANEDIOL 5 MG PO TABS
5.0000 mg | ORAL_TABLET | Freq: Every day | ORAL | Status: DC
  Administered 2022-10-01 – 2022-10-03 (×3): 5 mg via ORAL
  Filled 2022-09-30 (×4): qty 1

## 2022-09-30 MED ORDER — ONDANSETRON HCL 4 MG/2ML IJ SOLN: 4.0000 mg | Freq: Once | INTRAMUSCULAR | Status: DC | PRN

## 2022-09-30 MED ORDER — PHENYLEPHRINE 80 MCG/ML (10ML) SYRINGE FOR IV PUSH (FOR BLOOD PRESSURE SUPPORT)
PREFILLED_SYRINGE | INTRAVENOUS | Status: DC | PRN
  Administered 2022-09-30: 80 ug via INTRAVENOUS
  Administered 2022-09-30: 40 ug via INTRAVENOUS
  Administered 2022-09-30 (×4): 80 ug via INTRAVENOUS
  Administered 2022-09-30: 40 ug via INTRAVENOUS
  Administered 2022-09-30: 80 ug via INTRAVENOUS

## 2022-09-30 MED ORDER — SODIUM CHLORIDE 0.9% FLUSH: 3.0000 mL | INTRAVENOUS | Status: DC | PRN

## 2022-09-30 MED ORDER — ROCURONIUM BROMIDE 10 MG/ML (PF) SYRINGE
PREFILLED_SYRINGE | INTRAVENOUS | Status: AC
  Filled 2022-09-30: qty 10

## 2022-09-30 MED ORDER — LACTATED RINGERS IV SOLN: INTRAVENOUS | Status: DC

## 2022-09-30 MED ORDER — DEXAMETHASONE SODIUM PHOSPHATE 10 MG/ML IJ SOLN
INTRAMUSCULAR | Status: AC
  Filled 2022-09-30: qty 1

## 2022-09-30 MED ORDER — POLYETHYLENE GLYCOL 3350 17 G PO PACK: 17.0000 g | PACK | Freq: Every day | ORAL | Status: DC | PRN

## 2022-09-30 MED ORDER — MECLIZINE HCL 12.5 MG PO TABS: 12.5000 mg | ORAL_TABLET | Freq: Three times a day (TID) | ORAL | Status: DC | PRN

## 2022-09-30 MED ORDER — ALUM & MAG HYDROXIDE-SIMETH 200-200-20 MG/5ML PO SUSP
30.0000 mL | Freq: Four times a day (QID) | ORAL | Status: DC | PRN
  Administered 2022-10-02: 30 mL via ORAL
  Filled 2022-09-30: qty 30

## 2022-09-30 MED ORDER — ALBUMIN HUMAN 5 % IV SOLN
12.5000 g | Freq: Once | INTRAVENOUS | Status: AC
  Administered 2022-09-30: 12.5 g via INTRAVENOUS

## 2022-09-30 MED ORDER — KETOROLAC TROMETHAMINE 30 MG/ML IJ SOLN
INTRAMUSCULAR | Status: AC
  Filled 2022-09-30: qty 1

## 2022-09-30 MED ORDER — MAGNESIUM SULFATE 50 % IJ SOLN
INTRAMUSCULAR | Status: DC | PRN
  Administered 2022-09-30: 2 g via INTRAVENOUS

## 2022-09-30 MED ORDER — THROMBIN 5000 UNITS EX SOLR
OROMUCOSAL | Status: DC | PRN
  Administered 2022-09-30 (×2): 5 mL

## 2022-09-30 MED ORDER — METHOCARBAMOL 1000 MG/10ML IJ SOLN: 500.0000 mg | Freq: Four times a day (QID) | INTRAVENOUS | Status: DC | PRN

## 2022-09-30 MED ORDER — ACETAMINOPHEN 10 MG/ML IV SOLN
INTRAVENOUS | Status: DC | PRN
  Administered 2022-09-30: 1000 mg via INTRAVENOUS

## 2022-09-30 MED ORDER — CEFAZOLIN SODIUM-DEXTROSE 2-4 GM/100ML-% IV SOLN
2.0000 g | Freq: Three times a day (TID) | INTRAVENOUS | Status: AC
  Administered 2022-09-30 – 2022-10-01 (×2): 2 g via INTRAVENOUS
  Filled 2022-09-30 (×2): qty 100

## 2022-09-30 MED ORDER — ALBUMIN HUMAN 5 % IV SOLN
INTRAVENOUS | Status: AC
  Filled 2022-09-30: qty 250

## 2022-09-30 MED ORDER — FENTANYL CITRATE (PF) 250 MCG/5ML IJ SOLN
INTRAMUSCULAR | Status: DC | PRN
  Administered 2022-09-30: 25 ug via INTRAVENOUS
  Administered 2022-09-30: 100 ug via INTRAVENOUS
  Administered 2022-09-30: 50 ug via INTRAVENOUS
  Administered 2022-09-30 (×2): 100 ug via INTRAVENOUS

## 2022-09-30 MED ORDER — BUPIVACAINE HCL (PF) 0.5 % IJ SOLN
INTRAMUSCULAR | Status: AC
  Filled 2022-09-30: qty 30

## 2022-09-30 MED ORDER — METFORMIN HCL 500 MG PO TABS
1000.0000 mg | ORAL_TABLET | Freq: Two times a day (BID) | ORAL | Status: DC
  Administered 2022-09-30 – 2022-10-03 (×6): 1000 mg via ORAL
  Filled 2022-09-30 (×6): qty 2

## 2022-09-30 MED ORDER — SUGAMMADEX SODIUM 200 MG/2ML IV SOLN
INTRAVENOUS | Status: DC | PRN
  Administered 2022-09-30: 200 mg via INTRAVENOUS

## 2022-09-30 MED ORDER — ACETAMINOPHEN ER 650 MG PO TBCR: 650.0000 mg | EXTENDED_RELEASE_TABLET | Freq: Three times a day (TID) | ORAL | Status: DC | PRN

## 2022-09-30 MED ORDER — MENTHOL 3 MG MT LOZG: 1.0000 | LOZENGE | OROMUCOSAL | Status: DC | PRN

## 2022-09-30 MED ORDER — INSULIN ASPART 100 UNIT/ML IJ SOLN
4.0000 [IU] | Freq: Once | INTRAMUSCULAR | Status: AC
  Administered 2022-09-30: 4 [IU] via SUBCUTANEOUS

## 2022-09-30 MED ORDER — FLEET ENEMA 7-19 GM/118ML RE ENEM: 1.0000 | ENEMA | Freq: Once | RECTAL | Status: DC | PRN

## 2022-09-30 MED ORDER — ACETAMINOPHEN 10 MG/ML IV SOLN
INTRAVENOUS | Status: AC
  Filled 2022-09-30: qty 100

## 2022-09-30 MED ORDER — HYDROMORPHONE HCL 1 MG/ML IJ SOLN
0.2500 mg | INTRAMUSCULAR | Status: DC | PRN
  Administered 2022-09-30 (×4): 0.5 mg via INTRAVENOUS

## 2022-09-30 MED ORDER — METHOCARBAMOL 500 MG PO TABS
500.0000 mg | ORAL_TABLET | Freq: Four times a day (QID) | ORAL | Status: DC | PRN
  Administered 2022-10-01 – 2022-10-03 (×5): 500 mg via ORAL
  Filled 2022-09-30 (×5): qty 1

## 2022-09-30 MED ORDER — DEXAMETHASONE SODIUM PHOSPHATE 10 MG/ML IJ SOLN
INTRAMUSCULAR | Status: DC | PRN
  Administered 2022-09-30: 10 mg via INTRAVENOUS

## 2022-09-30 MED ORDER — LIDOCAINE IN D5W 4-5 MG/ML-% IV SOLN
INTRAVENOUS | Status: DC | PRN
  Administered 2022-09-30: 2 mg/min via INTRAVENOUS

## 2022-09-30 MED ORDER — CEFAZOLIN SODIUM-DEXTROSE 2-4 GM/100ML-% IV SOLN
2.0000 g | INTRAVENOUS | Status: AC
  Administered 2022-09-30 (×2): 2 g via INTRAVENOUS
  Filled 2022-09-30: qty 100

## 2022-09-30 MED ORDER — ACETAMINOPHEN 10 MG/ML IV SOLN: 1000.0000 mg | Freq: Once | INTRAVENOUS | Status: DC | PRN

## 2022-09-30 MED ORDER — INSULIN ASPART 100 UNIT/ML IJ SOLN
0.0000 [IU] | Freq: Three times a day (TID) | INTRAMUSCULAR | Status: DC
  Administered 2022-09-30: 11 [IU] via SUBCUTANEOUS
  Administered 2022-10-01: 4 [IU] via SUBCUTANEOUS
  Administered 2022-10-01: 7 [IU] via SUBCUTANEOUS
  Administered 2022-10-01: 4 [IU] via SUBCUTANEOUS
  Administered 2022-10-02: 7 [IU] via SUBCUTANEOUS
  Administered 2022-10-02: 4 [IU] via SUBCUTANEOUS
  Administered 2022-10-02 – 2022-10-03 (×2): 7 [IU] via SUBCUTANEOUS
  Administered 2022-10-03: 4 [IU] via SUBCUTANEOUS

## 2022-09-30 MED ORDER — KETAMINE HCL 50 MG/5ML IJ SOSY
PREFILLED_SYRINGE | INTRAMUSCULAR | Status: AC
  Filled 2022-09-30: qty 5

## 2022-09-30 MED ORDER — SENNA 8.6 MG PO TABS
1.0000 | ORAL_TABLET | Freq: Two times a day (BID) | ORAL | Status: DC
  Administered 2022-09-30 – 2022-10-03 (×5): 8.6 mg via ORAL
  Filled 2022-09-30 (×5): qty 1

## 2022-09-30 MED ORDER — HYDROCHLOROTHIAZIDE 25 MG PO TABS
25.0000 mg | ORAL_TABLET | Freq: Every day | ORAL | Status: DC
  Administered 2022-10-01 – 2022-10-03 (×3): 25 mg via ORAL
  Filled 2022-09-30 (×3): qty 1

## 2022-09-30 MED ORDER — PHENYLEPHRINE 80 MCG/ML (10ML) SYRINGE FOR IV PUSH (FOR BLOOD PRESSURE SUPPORT)
PREFILLED_SYRINGE | INTRAVENOUS | Status: AC
  Filled 2022-09-30: qty 10

## 2022-09-30 SURGICAL SUPPLY — 78 items
ADH SKN CLS APL DERMABOND .7 (GAUZE/BANDAGES/DRESSINGS) ×1
APL SRG 60D 8 XTD TIP BNDBL (TIP)
BAG COUNTER SPONGE SURGICOUNT (BAG) ×1 IMPLANT
BAG SPNG CNTER NS LX DISP (BAG) ×1
BASKET BONE COLLECTION (BASKET) ×1 IMPLANT
BLADE BONE MILL MEDIUM (MISCELLANEOUS) ×1 IMPLANT
BLADE CLIPPER SURG (BLADE) IMPLANT
BONE CANC CHIPS 20CC PCAN1/4 (Bone Implant) ×2 IMPLANT
BUR CROSS CUT FISSURE 1.2 (BURR) IMPLANT
BUR MATCHSTICK NEURO 3.0 LAGG (BURR) ×1 IMPLANT
CANISTER SUCT 3000ML PPV (MISCELLANEOUS) ×1 IMPLANT
CEMENT KYPHON CX01A KIT/MIXER (Cement) IMPLANT
CHIPS CANC BONE 20CC PCAN1/4 (Bone Implant) ×2 IMPLANT
CNTNR URN SCR LID CUP LEK RST (MISCELLANEOUS) ×1 IMPLANT
CONN RELINE 5-6/5-6M 2H INLINE (Connector) ×2 IMPLANT
CONNECTOR RELN 5-6/5-6M 2 HL (Connector) IMPLANT
CONT SPEC 4OZ STRL OR WHT (MISCELLANEOUS) ×1
COVER BACK TABLE 60X90IN (DRAPES) ×1 IMPLANT
DERMABOND ADVANCED .7 DNX12 (GAUZE/BANDAGES/DRESSINGS) ×1 IMPLANT
DEVICE DISSECT PLASMABLAD 3.0S (MISCELLANEOUS) ×1 IMPLANT
DRAPE C-ARM 42X72 X-RAY (DRAPES) ×2 IMPLANT
DRAPE HALF SHEET 40X57 (DRAPES) IMPLANT
DRAPE LAPAROTOMY 100X72X124 (DRAPES) ×1 IMPLANT
DRSG OPSITE POSTOP 4X6 (GAUZE/BANDAGES/DRESSINGS) IMPLANT
DRSG OPSITE POSTOP 4X8 (GAUZE/BANDAGES/DRESSINGS) IMPLANT
DURAPREP 26ML APPLICATOR (WOUND CARE) ×1 IMPLANT
DURASEAL APPLICATOR TIP (TIP) IMPLANT
DURASEAL SPINE SEALANT 3ML (MISCELLANEOUS) IMPLANT
ELECT REM PT RETURN 9FT ADLT (ELECTROSURGICAL) ×1
ELECTRODE REM PT RTRN 9FT ADLT (ELECTROSURGICAL) ×1 IMPLANT
GAUZE 4X4 16PLY ~~LOC~~+RFID DBL (SPONGE) IMPLANT
GAUZE SPONGE 4X4 12PLY STRL (GAUZE/BANDAGES/DRESSINGS) ×1 IMPLANT
GLOVE BIOGEL PI IND STRL 8.5 (GLOVE) ×2 IMPLANT
GLOVE ECLIPSE 8.5 STRL (GLOVE) ×2 IMPLANT
GOWN STRL REUS W/ TWL LRG LVL3 (GOWN DISPOSABLE) IMPLANT
GOWN STRL REUS W/ TWL XL LVL3 (GOWN DISPOSABLE) IMPLANT
GOWN STRL REUS W/TWL 2XL LVL3 (GOWN DISPOSABLE) ×2 IMPLANT
GOWN STRL REUS W/TWL LRG LVL3 (GOWN DISPOSABLE)
GOWN STRL REUS W/TWL XL LVL3 (GOWN DISPOSABLE)
GRAFT BNE CANC CHIPS 1-8 20CC (Bone Implant) IMPLANT
GRAFT BONE PROTEIOS LRG 5CC (Orthopedic Implant) IMPLANT
HEMOSTAT POWDER KIT SURGIFOAM (HEMOSTASIS) ×1 IMPLANT
KIT BASIN OR (CUSTOM PROCEDURE TRAY) ×1 IMPLANT
KIT GRAFTMAG DEL NEURO DISP (NEUROSURGERY SUPPLIES) IMPLANT
KIT TURNOVER KIT B (KITS) ×1 IMPLANT
MILL BONE PREP (MISCELLANEOUS) ×1 IMPLANT
NDL RELINE-OR FENS 16 (NEEDLE) IMPLANT
NDL SPNL 18GX3.5 QUINCKE PK (NEEDLE) IMPLANT
NEEDLE HYPO 22GX1.5 SAFETY (NEEDLE) ×1 IMPLANT
NEEDLE RELINE-OR FENS 16 (NEEDLE) ×6 IMPLANT
NEEDLE SPNL 18GX3.5 QUINCKE PK (NEEDLE) IMPLANT
NS IRRIG 1000ML POUR BTL (IV SOLUTION) ×1 IMPLANT
PACK LAMINECTOMY NEURO (CUSTOM PROCEDURE TRAY) ×1 IMPLANT
PAD ARMBOARD 7.5X6 YLW CONV (MISCELLANEOUS) ×3 IMPLANT
PATTIES SURGICAL .5 X1 (DISPOSABLE) ×1 IMPLANT
PLASMABLADE 3.0S (MISCELLANEOUS) ×1
PUSHER RELINE FENS 36 OR (MISCELLANEOUS) IMPLANT
RASP 3.0MM (RASP) IMPLANT
ROD ARM15T 90MM (Rod) IMPLANT
SCREW LOCK RELINE 5.5 TULIP (Screw) IMPLANT
SCREW RELINE PA 2FC 5.5X45 (Screw) IMPLANT
SOL ELECTROSURG ANTI STICK (MISCELLANEOUS)
SOLUTION ELECTROSURG ANTI STCK (MISCELLANEOUS) ×1 IMPLANT
SPIKE FLUID TRANSFER (MISCELLANEOUS) ×1 IMPLANT
SPONGE SURGIFOAM ABS GEL 100 (HEMOSTASIS) IMPLANT
SPONGE T-LAP 4X18 ~~LOC~~+RFID (SPONGE) IMPLANT
SUT PROLENE 6 0 BV (SUTURE) IMPLANT
SUT VIC AB 1 CT1 18XBRD ANBCTR (SUTURE) ×1 IMPLANT
SUT VIC AB 1 CT1 8-18 (SUTURE) ×2
SUT VIC AB 2-0 CP2 18 (SUTURE) ×1 IMPLANT
SUT VIC AB 3-0 SH 8-18 (SUTURE) ×1 IMPLANT
SUT VIC AB 4-0 RB1 18 (SUTURE) ×1 IMPLANT
SYR 3ML LL SCALE MARK (SYRINGE) ×4 IMPLANT
SYR 5ML LL (SYRINGE) IMPLANT
TOWEL GREEN STERILE (TOWEL DISPOSABLE) ×1 IMPLANT
TOWEL GREEN STERILE FF (TOWEL DISPOSABLE) ×1 IMPLANT
TRAY FOLEY MTR SLVR 16FR STAT (SET/KITS/TRAYS/PACK) ×1 IMPLANT
WATER STERILE IRR 1000ML POUR (IV SOLUTION) ×1 IMPLANT

## 2022-09-30 NOTE — Progress Notes (Signed)
Orthopedic Tech Progress Note Patient Details:  OLIVA COLUMBO 1941-04-30 [id number] VertAlign TLSO has been ordered from [facility]  Patient ID: Loni Beckwith, female   DOB: [birth year], 82 y.o.   MRN: [id number]  Jearld Lesch 09/30/2022, 4:36 PM

## 2022-09-30 NOTE — Consult Note (Addendum)
Cardiology Consultation   Patient ID: CHIFFON SELLEN MRN: [id number]; DOB: May 04, 1941  Admit date: 09/30/2022 Date of Consult: 09/30/2022  PCP:  Biagio Borg, MD   [place] [facility] Cardiologist:  Larae Grooms, MD  -- new    Patient Profile:   Kelsey Keller is a 82 y.o. female with a hx of type 2 DM, HTN, HLD, GERD, osteoarthritis, scoliosis who is being seen 09/30/2022 for the evaluation of post operative atrial fibrillation at the request of Dr. Ellene Route   History of Present Illness:   Kelsey Keller is a 82 year old female with above medical history. Per chart review, patient does not have cardiac history and has never seen a cardiologist. She has a history of heart murmur, and she underwent echocardiogram on 08/14/22 that showed EF 60-65%, no regional wall motion abnormalities, grade II diastolic dysfunction, normal RV systolic function, moderately dilated LA, mild aortic valve stenosis.   Patient presented on 3/18 for scheduled surgery on her thoracic spine. After she was inducted, she went into atrial fibrillation. EKG showed atrial fibrillation with HR 93 BPM. Patient was asymptomatic. Cardiology asked to consult   On interview, patient denies any personal cardiac history. She denies history of MI, afib, CHF. Her father had a heart attack and her brother has both CAD and atrial fibrillation. She cannot feel that she is in afib. No chest pain, palpitations, fluttering in her chest. No shortness of breath. Did get a little dizzy when she first transferred into the bed, but she denies dizziness otherwise.   Past Medical History:  Diagnosis Date   ALLERGIC RHINITIS 04/29/2007   Allergy    Basal cell carcinoma 03/24/2018   sclerosis- upper bridge of nose ([facility])   BCC (basal cell carcinoma) 10/18/2019   right supra orbital region- Cx3 9fu   Cataract    both eyes   DIABETES MELLITUS, TYPE II 06/23/2007   type 2   Elevated liver enzymes 03/2017   Elevated tumor  markers    GERD (gastroesophageal reflux disease)    Heart murmur    per patient   HIATAL HERNIA, HX OF 04/29/2007   History of hiatal hernia    HYPERLIPIDEMIA 04/29/2007   diet controlled on CoQ10   HYPERTENSION 04/29/2007   HYPOKALEMIA 05/02/2009   HYPOTHYROIDISM 04/29/2007   LOW BACK PAIN 04/29/2007   Neuromuscular disorder ([place])    diabetic neuropathy in both legs   OSTEOARTHRITIS, KNEE, LEFT 06/23/2007   OSTEOPOROSIS NOS 05/12/2007   SPINAL STENOSIS, LUMBAR 04/29/2007    Past Surgical History:  Procedure Laterality Date   BACK SURGERY N/A 2014 x 2   T1 TO S 1    CATARACT EXTRACTION W/ INTRAOCULAR LENS  IMPLANT, BILATERAL Bilateral 05/2011   doing well   COLONOSCOPY WITH PROPOFOL N/A 04/10/2017   Procedure: COLONOSCOPY WITH PROPOFOL;  Surgeon: [name], MD;  Location: [facility];  Service: Endoscopy;  Laterality: N/A;   COLONSCOPY  2011   JOINT REPLACEMENT     TOTAL KNEE ARTHROPLASTY  2006   right   TOTAL KNEE ARTHROPLASTY Left 2012     Home Medications:  Prior to Admission medications   Medication Sig Start Date End Date Taking? Authorizing Provider  aspirin 81 MG EC tablet Take 81 mg by mouth at bedtime.   Yes [provider]  atenolol (TENORMIN) 50 MG tablet TAKE 1 TABLET BY MOUTH EVERY DAY 08/28/22  Yes Biagio Borg, MD  Cholecalciferol (VITAMIN D3) 50 MCG (2000 UT)  TABS Take 2,000 Units by mouth daily.   Yes [provider]  Co-Enzyme Q-10 100 MG CAPS Take 100 mg by mouth daily.   Yes [provider]  cyanocobalamin 1000 MCG tablet Take 1,000 mcg by mouth daily.   Yes [provider]  dapagliflozin propanediol (FARXIGA) 5 MG TABS tablet Take 1 tablet (5 mg total) by mouth daily before breakfast. 09/10/22  Yes Biagio Borg, MD  fluticasone (FLONASE) 50 MCG/ACT nasal spray SPRAY 2 SPRAYS INTO EACH NOSTRIL EVERY DAY Patient taking differently: Place 2 sprays into both nostrils daily as needed for allergies. 05/01/22  Yes  Biagio Borg, MD  hydrochlorothiazide (HYDRODIURIL) 25 MG tablet TAKE 1 TABLET BY MOUTH EVERY DAY 09/10/22  Yes Biagio Borg, MD  levothyroxine (SYNTHROID) 125 MCG tablet TAKE 1 TABLET BY MOUTH EVERY DAY BEFORE BREAKFAST 07/29/22  Yes Biagio Borg, MD  Magnesium 250 MG TABS Take 250 mg by mouth daily.   Yes [provider]  Menthol, Topical Analgesic, (BENGAY EX) Apply 1 Application topically daily as needed (arthritis pain).   Yes [provider]  metFORMIN (GLUCOPHAGE) 1000 MG tablet TAKE 1 TABLET BY MOUTH TWICE A DAY WITH FOOD 07/29/22  Yes Biagio Borg, MD  omeprazole (PRILOSEC) 40 MG capsule Take 1 capsule (40 mg total) by mouth daily. Patient taking differently: Take 40 mg by mouth at bedtime. 09/11/22  Yes Biagio Borg, MD  pioglitazone (ACTOS) 30 MG tablet TAKE 1 TABLET BY MOUTH EVERY DAY 02/08/22  Yes Biagio Borg, MD  Polyethyl Glycol-Propyl Glycol (SYSTANE OP) Place 1 drop into both eyes daily as needed (dry eyes/allergy season).   Yes [provider]  Red Yeast Rice Extract 600 MG CAPS Take 600 mg by mouth 2 (two) times daily.   Yes [provider]  tiZANidine (ZANAFLEX) 2 MG tablet TAKE 1 TABLET BY MOUTH AT BEDTIME AS NEEDED FOR MUSCLE SPASMS. 04/22/22  Yes Biagio Borg, MD  traMADol (ULTRAM) 50 MG tablet TAKE 1 TABLET BY MOUTH EVERY 8 HOURS AS NEEDED. 04/29/22  Yes Biagio Borg, MD  acetaminophen (TYLENOL) 650 MG CR tablet Take 1,300 mg by mouth every 8 (eight) hours as needed for pain.    [provider]  blood glucose meter kit and supplies KIT Dispense with patient  Insurance; Use up to four times daily as directed. E11.9 08/12/18   Biagio Borg, MD  Blood Glucose Monitoring Suppl (ONE TOUCH ULTRA 2) w/Device KIT Use as directed up to 4 times per day E11.9 08/12/18   Biagio Borg, MD  glucose blood [facility] VERIO) test strip USE TO CHECK BLOOD SUGARS TWICE A DAY 09/17/22   Biagio Borg, MD  Lancets MISC Use to check blood sugars twice a  day 09/17/22   Biagio Borg, MD  meclizine (ANTIVERT) 12.5 MG tablet Take 1 tablet (12.5 mg total) by mouth 3 (three) times daily as needed for dizziness. 09/14/18   Janith Lima, MD  meloxicam (MOBIC) 7.5 MG tablet TAKE 1 TABLET BY MOUTH EVERY DAY AS NEEDED FOR PAIN 02/01/22   Biagio Borg, MD    Inpatient Medications: Scheduled Meds:  [START ON 10/01/2022] atenolol  50 mg Oral Daily   [START ON 10/01/2022] dapagliflozin propanediol  5 mg Oral QAC breakfast   docusate sodium  100 mg Oral BID   [START ON 10/01/2022] hydrochlorothiazide  25 mg Oral Daily   HYDROmorphone       HYDROmorphone  insulin aspart  0-20 Units Subcutaneous TID WC   [START ON 10/01/2022] levothyroxine  125 mcg Oral Q0600   metFORMIN  1,000 mg Oral BID WC   pantoprazole  40 mg Oral BID   pioglitazone  30 mg Oral Daily   senna  1 tablet Oral BID   sodium chloride flush  3 mL Intravenous Q12H   Continuous Infusions:  sodium chloride     albumin human      ceFAZolin (ANCEF) IV     lactated ringers     methocarbamol (ROBAXIN) IV     PRN Meds: acetaminophen **OR** acetaminophen, albumin human, alum & mag hydroxide-simeth, bisacodyl, fluticasone, HYDROmorphone, HYDROmorphone, meclizine, menthol-cetylpyridinium **OR** phenol, methocarbamol **OR** methocarbamol (ROBAXIN) IV, morphine injection, ondansetron **OR** ondansetron (ZOFRAN) IV, oxyCODONE-acetaminophen, polyethylene glycol, polyvinyl alcohol, sodium chloride flush, sodium phosphate  Allergies:   No Known Allergies  Social History:   Social History   Socioeconomic History   Marital status: Divorced    Spouse name: Not on file   Number of children: 1   Years of education: Not on file   Highest education level: Not on file  Occupational History   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  Tobacco Use   Smoking status: Never   Smokeless tobacco: Never  Vaping Use   Vaping Use: Never used  Substance and Sexual Activity   Alcohol use: Yes     Alcohol/week: 0.0 standard drinks of alcohol    Comment: One or two drinks in month   Drug use: No   Sexual activity: Not Currently  Other Topics Concern   Not on file  Social History Narrative   Not on file   Social Determinants of Health   Financial Resource Strain: Low Risk  (04/26/2021)   Overall Financial Resource Strain (CARDIA)    Difficulty of Paying Living Expenses: Not hard at all  Food Insecurity: No Food Insecurity (04/26/2021)   Hunger Vital Sign    Worried About Running Out of Food in the Last Year: Never true    Ran Out of Food in the Last Year: Never true  Transportation Needs: No Transportation Needs (04/26/2021)   PRAPARE - Hydrologist (Medical): No    Lack of Transportation (Non-Medical): No  Physical Activity: Inactive (04/26/2021)   Exercise Vital Sign    Days of Exercise per Week: 0 days    Minutes of Exercise per Session: 0 min  Stress: No Stress Concern Present (04/26/2021)   [place]    Feeling of Stress : Not at all  Social Connections: Moderately Integrated (04/26/2021)   Social Connection and Isolation Panel [NHANES]    Frequency of Communication with Friends and Family: More than three times a week    Frequency of Social Gatherings with Friends and Family: More than three times a week    Attends Religious Services: More than 4 times per year    Active Member of [employer] or Organizations: Yes    Attends Archivist Meetings: More than 4 times per year    Marital Status: Widowed  Intimate Partner Violence: Not At Risk (04/26/2021)   Humiliation, Afraid, Rape, and Kick questionnaire    Fear of Current or Ex-Partner: No    Emotionally Abused: No    Physically Abused: No    Sexually Abused: No    Family History:    Family History  Problem Relation Age of Onset   Arthritis  Mother    Hypertension Mother    Coronary artery disease Father     Arthritis Brother    Stroke Brother    Hyperlipidemia Brother    COPD Brother    Coronary artery disease Other        Female 1st degree relative   Diabetes Other        1st degree relative   Colon polyps Other    Colon cancer Neg Hx    Esophageal cancer Neg Hx    Rectal cancer Neg Hx    Stomach cancer Neg Hx    Inflammatory bowel disease Neg Hx    Liver disease Neg Hx    Pancreatic cancer Neg Hx      ROS:  Please see the history of present illness.   All other ROS reviewed and negative.     Physical Exam/Data:   Vitals:   09/30/22 1430 09/30/22 1431 09/30/22 1445 09/30/22 1500  BP: 99/63 99/63 (!) 97/56 (!) 99/58  Pulse: 88 90 87 88  Resp: 17 17 14 18   Temp:    98 F (36.7 C)  SpO2: 95% 94% 99% 98%  Weight:      Height:        Intake/Output Summary (Last 24 hours) at 09/30/2022 1652 Last data filed at 09/30/2022 1611 Gross per 24 hour  Intake 1421 ml  Output 525 ml  Net 896 ml      09/30/2022    5:57 AM 09/25/2022    8:09 AM 09/18/2022    8:52 AM  Last 3 Weights  Weight (lbs) 208 lb 207 lb 208 lb 3.2 oz  Weight (kg) 94.348 kg 93.895 kg 94.439 kg     Body mass index is 35.7 kg/m.  General:  Well nourished, well developed, in no acute distress. Laying in the bed with head elevated  HEENT: normal Neck: no JVD Vascular: Radial pulses 2+ bilaterally Cardiac:  normal S1, S2; irregular rate and rhythm. Grade 3/6 systolic murmur  Lungs:  clear to auscultation bilaterally, no wheezing, rhonchi or rales. Normal WOB on room air   Abd: soft, nontender Ext: no edema in BLE Musculoskeletal:  No deformities, BUE and BLE strength normal and equal Skin: warm and dry  Neuro:  CNs 2-12 intact, no focal abnormalities noted Psych:  Normal affect   EKG:  The EKG was personally reviewed and demonstrates:  Atrial fibrillation, HR 93 BPM  Telemetry:  Telemetry was personally reviewed and demonstrates:  Atrial fibrillation, HR in the 80s-90s   Relevant CV  Studies:  Echocardiogram 08/14/22  1. Left ventricular ejection fraction, by estimation, is 60 to 65%. The  left ventricle has normal function. The left ventricle has no regional  wall motion abnormalities. Left ventricular diastolic parameters are  consistent with Grade II diastolic  dysfunction (pseudonormalization).   2. Right ventricular systolic function is normal. The right ventricular  size is normal. There is normal pulmonary artery systolic pressure.   3. Left atrial size was moderately dilated.   4. Right atrial size was mildly dilated.   5. The mitral valve is normal in structure. Trivial mitral valve  regurgitation. No evidence of mitral stenosis.   6. The aortic valve is calcified. There is moderate calcification of the  aortic valve. There is moderate thickening of the aortic valve. Aortic  valve regurgitation is trivial. Mild aortic valve stenosis. Aortic valve  mean gradient measures 16.3 mmHg.   7. The inferior vena cava is normal in size with greater than  50%  respiratory variability, suggesting right atrial pressure of 3 mmHg.   Comparison(s): No prior Echocardiogram.   Conclusion(s)/Recommendation(s): Mild aortic stenosis, aortic valve with  significant calcification/restriction of NCC/LCC suggesting valve is  functionally bicuspid.   Laboratory Data:  High Sensitivity Troponin:  No results for input(s): "TROPONINIHS" in the last 720 hours.   ChemistryNo results for input(s): "NA", "K", "CL", "CO2", "GLUCOSE", "BUN", "CREATININE", "CALCIUM", "MG", "GFRNONAA", "GFRAA", "ANIONGAP" in the last 168 hours.  No results for input(s): "PROT", "ALBUMIN", "AST", "ALT", "ALKPHOS", "BILITOT" in the last 168 hours. Lipids No results for input(s): "CHOL", "TRIG", "HDL", "LABVLDL", "LDLCALC", "CHOLHDL" in the last 168 hours.  HematologyNo results for input(s): "WBC", "RBC", "HGB", "HCT", "MCV", "MCH", "MCHC", "RDW", "PLT" in the last 168 hours. Thyroid No results for input(s):  "TSH", "FREET4" in the last 168 hours.  BNPNo results for input(s): "BNP", "PROBNP" in the last 168 hours.  DDimer No results for input(s): "DDIMER" in the last 168 hours.   Radiology/Studies:  DG THORACOLUMBAR SPINE  Result Date: 09/30/2022 CLINICAL DATA:  Thoracic spine decompression with hardware EXAM: THORACOLUMBAR SPINE intraoperative fluoroscopy COMPARISON:  02/26/2022 x-ray FINDINGS: Two fluoroscopic spot images submitted for review demonstrate vertical fixation rods and pedicle screws along the visualized lower thoracic and upper lumbar spine. Imaging was obtained to aid in treatment. Please correlate with real-time fluoroscopy of 73.9 seconds. Cumulative dose 0.67469 mGy meter squared IMPRESSION: Intraoperative fluoroscopy Electronically Signed   By: Jill Side M.D.   On: 09/30/2022 16:00   DG C-Arm 1-60 Min-No Report  Result Date: 09/30/2022 Fluoroscopy was utilized by the requesting physician.  No radiographic interpretation.   DG C-Arm 1-60 Min-No Report  Result Date: 09/30/2022 Fluoroscopy was utilized by the requesting physician.  No radiographic interpretation.   DG C-Arm 1-60 Min-No Report  Result Date: 09/30/2022 Fluoroscopy was utilized by the requesting physician.  No radiographic interpretation.   DG C-Arm 1-60 Min-No Report  Result Date: 09/30/2022 Fluoroscopy was utilized by the requesting physician.  No radiographic interpretation.   DG C-Arm 1-60 Min-No Report  Result Date: 09/30/2022 Fluoroscopy was utilized by the requesting physician.  No radiographic interpretation.     Assessment and Plan:   Post operative atrial fibrillation  - Patient presented for scheduled surgery on her thoracic spine- after surgery developed atrial fibrillation with HR in the 80s-90s  - Now, patient remains in afib. HR is well controlled in the 80s-90s. She is asymptomatic - PTA, she was on atenolol for hypertension. Continue atenolol 50 mg daily  - CHADS-VASc 5 (HTN,  diabetes, age x2, gender). Continue to monitor on telemetry and consider starting anticoagulation when OK with neurosurgery if she has significant afib burden - Ordered TSH to be drawn with AM labs. Also ordered BMP and CBC  - Echocardiogram from 08/14/22 showed EF 60-65%, grade II diastolic dysfunction, normal RV systolic function, Trivial MR, mild aortic stenosis   HTN  - Resume home atenolol and HCTZ tomorrow   Otherwise per primary  - Post op  - Diabetes  - Hypothyroidism   Risk Assessment/Risk Scores:      CHA2DS2-VASc Score = 5   This indicates a 7.2% annual risk of stroke. The patient's score is based upon: CHF History: 0 HTN History: 1 Diabetes History: 1 Stroke History: 0 Vascular Disease History: 0 Age Score: 2 Gender Score: 1     For questions or updates, please contact [place] Please consult [email or web address] for contact info under    Signed, Margie Billet,  PA-C  09/30/2022 4:52 PM  I have examined the patient and reviewed assessment and plan and discussed with patient.  Agree with above as stated.    New onset AFib.  Rate controlled. Not a candidate for anticoagulation currently due to recent spine surgery.  Hopefully, she will convert on her own back to NSR.  If AFib persists, will have to consider anticoagulation to reduce risk of stroke, when safe from a surgical standpoint.  Larae Grooms

## 2022-09-30 NOTE — Plan of Care (Signed)

## 2022-09-30 NOTE — Anesthesia Procedure Notes (Signed)
Arterial Line Insertion Start/End3/18/2024 7:10 AM, 09/30/2022 7:20 AM Performed by: Barnet Glasgow, MD, Cathren Harsh, CRNA, CRNA  Patient location: Pre-op. Preanesthetic checklist: patient identified, IV checked, site marked, risks and benefits discussed, surgical consent, monitors and equipment checked, pre-op evaluation, timeout performed and anesthesia consent Lidocaine 1% used for infiltration Right, radial was placed Catheter size: 20 G Hand hygiene performed , maximum sterile barriers used  and Seldinger technique used Allen's test indicative of satisfactory collateral circulation Attempts: 2 Procedure performed using ultrasound guided technique. Ultrasound Notes:anatomy identified, needle tip was noted to be adjacent to the nerve/plexus identified and no ultrasound evidence of intravascular and/or intraneural injection Following insertion, dressing applied. Post procedure assessment: normal and unchanged  Patient tolerated the procedure well with no immediate complications.

## 2022-09-30 NOTE — Transfer of Care (Signed)
Immediate Anesthesia Transfer of Care Note  Patient: Kelsey Keller  Procedure(s) Performed: Revision of pseudoarthrosis at Thoracic Ten-Thoracic Eleven, Decompression of Thoracic Eight-Thoracic Nine,Thoracic Nine-Thoracic Ten, Thoracic Ten-Thoracic Eleven, Pedicle fixation from Thoracic Seven-Lumbar One, Removal of Left Lumbar Three screw (Back)  Patient Location: PACU  Anesthesia Type:General  Level of Consciousness: awake, drowsy, patient cooperative, and responds to stimulation  Airway & Oxygen Therapy: Patient Spontanous Breathing and Patient connected to face mask oxygen  Post-op Assessment: Report given to RN, Post -op Vital signs reviewed and stable, and Patient moving all extremities X 4  Post vital signs: Reviewed and stable  Last Vitals:  Vitals Value Taken Time  BP 127/77 09/30/22 1311  Temp    Pulse 83 09/30/22 1315  Resp 22 09/30/22 1315  SpO2 100 % 09/30/22 1315  Vitals shown include unvalidated device data.  Last Pain:  Vitals:   09/30/22 0629  PainSc: 0-No pain         Complications: No notable events documented.

## 2022-09-30 NOTE — H&P (Signed)
Kelsey Keller is an 82 y.o. female.   Chief Complaint: Back pain leg weakness left lower extremity weakness history of scoliosis.  History of fusion. HPI: Kelsey Keller is a 82 year old individual who has had a decompression and fusion of her lumbar spine to the level of T10 this was done a number of years ago by Dr. Glenna Fellows.  She has evidence of a pseudoarthrosis at the level of T10-T11 and T11-T12 with a severe stenosis of her thoracic spine at T8-T9.  She also has placement of the left L3 pedicle screw which is lateral and inferior to the pedicle and in the region of the lumbar plexus.  I had evaluated her with a myelogram and postmyelogram CAT scan back in December I have advised surgical decompression arthrodesis to extend the fusion up to the level of T7 decompressed T8-T9 renew the fixation across the thoracolumbar junction and remove the L3 pedicle screw.  She is now admitted for that process.  Past Medical History:  Diagnosis Date   ALLERGIC RHINITIS 04/29/2007   Allergy    Basal cell carcinoma 03/24/2018   sclerosis- upper bridge of nose ([facility])   BCC (basal cell carcinoma) 10/18/2019   right supra orbital region- Cx3 65fu   Cataract    both eyes   DIABETES MELLITUS, TYPE II 06/23/2007   type 2   Elevated liver enzymes 03/2017   Elevated tumor markers    GERD (gastroesophageal reflux disease)    Heart murmur    per patient   HIATAL HERNIA, HX OF 04/29/2007   History of hiatal hernia    HYPERLIPIDEMIA 04/29/2007   diet controlled on CoQ10   HYPERTENSION 04/29/2007   HYPOKALEMIA 05/02/2009   HYPOTHYROIDISM 04/29/2007   LOW BACK PAIN 04/29/2007   Neuromuscular disorder ([place])    diabetic neuropathy in both legs   OSTEOARTHRITIS, KNEE, LEFT 06/23/2007   OSTEOPOROSIS NOS 05/12/2007   SPINAL STENOSIS, LUMBAR 04/29/2007    Past Surgical History:  Procedure Laterality Date   BACK SURGERY N/A 2014 x 2   T1 TO S 1    CATARACT EXTRACTION W/ INTRAOCULAR LENS  IMPLANT, BILATERAL  Bilateral 05/2011   doing well   COLONOSCOPY WITH PROPOFOL N/A 04/10/2017   Procedure: COLONOSCOPY WITH PROPOFOL;  Surgeon: [name], MD;  Location: [facility];  Service: Endoscopy;  Laterality: N/A;   COLONSCOPY  2011   JOINT REPLACEMENT     TOTAL KNEE ARTHROPLASTY  2006   right   TOTAL KNEE ARTHROPLASTY Left 2012    Family History  Problem Relation Age of Onset   Arthritis Mother    Hypertension Mother    Coronary artery disease Father    Arthritis Brother    Stroke Brother    Hyperlipidemia Brother    COPD Brother    Coronary artery disease Other        Female 1st degree relative   Diabetes Other        1st degree relative   Colon polyps Other    Colon cancer Neg Hx    Esophageal cancer Neg Hx    Rectal cancer Neg Hx    Stomach cancer Neg Hx    Inflammatory bowel disease Neg Hx    Liver disease Neg Hx    Pancreatic cancer Neg Hx    Social History:  reports that she has never smoked. She has never used smokeless tobacco. She reports current alcohol use. She reports that she does not use drugs.  Allergies: No Known Allergies  Medications Prior to Admission  Medication Sig Dispense Refill   aspirin 81 MG EC tablet Take 81 mg by mouth at bedtime.     atenolol (TENORMIN) 50 MG tablet TAKE 1 TABLET BY MOUTH EVERY DAY 90 tablet 1   Cholecalciferol (VITAMIN D3) 50 MCG (2000 UT) TABS Take 2,000 Units by mouth daily.     Co-Enzyme Q-10 100 MG CAPS Take 100 mg by mouth daily.     cyanocobalamin 1000 MCG tablet Take 1,000 mcg by mouth daily.     dapagliflozin propanediol (FARXIGA) 5 MG TABS tablet Take 1 tablet (5 mg total) by mouth daily before breakfast. 90 tablet 3   fluticasone (FLONASE) 50 MCG/ACT nasal spray SPRAY 2 SPRAYS INTO EACH NOSTRIL EVERY DAY (Patient taking differently: Place 2 sprays into both nostrils daily as needed for allergies.) 48 mL 3   hydrochlorothiazide (HYDRODIURIL) 25 MG tablet TAKE 1 TABLET BY MOUTH EVERY DAY 90 tablet 3   levothyroxine  (SYNTHROID) 125 MCG tablet TAKE 1 TABLET BY MOUTH EVERY DAY BEFORE BREAKFAST 90 tablet 1   Magnesium 250 MG TABS Take 250 mg by mouth daily.     Menthol, Topical Analgesic, (BENGAY EX) Apply 1 Application topically daily as needed (arthritis pain).     metFORMIN (GLUCOPHAGE) 1000 MG tablet TAKE 1 TABLET BY MOUTH TWICE A DAY WITH FOOD 180 tablet 1   omeprazole (PRILOSEC) 40 MG capsule Take 1 capsule (40 mg total) by mouth daily. (Patient taking differently: Take 40 mg by mouth at bedtime.) 90 capsule 3   pioglitazone (ACTOS) 30 MG tablet TAKE 1 TABLET BY MOUTH EVERY DAY 90 tablet 3   Polyethyl Glycol-Propyl Glycol (SYSTANE OP) Place 1 drop into both eyes daily as needed (dry eyes/allergy season).     Red Yeast Rice Extract 600 MG CAPS Take 600 mg by mouth 2 (two) times daily.     tiZANidine (ZANAFLEX) 2 MG tablet TAKE 1 TABLET BY MOUTH AT BEDTIME AS NEEDED FOR MUSCLE SPASMS. 90 tablet 1   traMADol (ULTRAM) 50 MG tablet TAKE 1 TABLET BY MOUTH EVERY 8 HOURS AS NEEDED. 90 tablet 5   acetaminophen (TYLENOL) 650 MG CR tablet Take 1,300 mg by mouth every 8 (eight) hours as needed for pain.     blood glucose meter kit and supplies KIT Dispense with patient  Insurance; Use up to four times daily as directed. E11.9 1 each 0   Blood Glucose Monitoring Suppl (ONE TOUCH ULTRA 2) w/Device KIT Use as directed up to 4 times per day E11.9 1 each 0   glucose blood (ONETOUCH VERIO) test strip USE TO CHECK BLOOD SUGARS TWICE A DAY 200 strip 0   Lancets MISC Use to check blood sugars twice a day 200 each 0   meclizine (ANTIVERT) 12.5 MG tablet Take 1 tablet (12.5 mg total) by mouth 3 (three) times daily as needed for dizziness. 90 tablet 1   meloxicam (MOBIC) 7.5 MG tablet TAKE 1 TABLET BY MOUTH EVERY DAY AS NEEDED FOR PAIN 90 tablet 1    Results for orders placed or performed during the hospital encounter of 09/30/22 (from the past 48 hour(s))  Glucose, capillary     Status: Abnormal   Collection Time: 09/30/22   6:02 AM  Result Value Ref Range   Glucose-Capillary 105 (H) 70 - 99 mg/dL    Comment: Glucose reference range applies only to samples taken after fasting for at least 8 hours.   No results found.  Review of Systems  Constitutional:  Positive for activity change.  Musculoskeletal:  Positive for back pain, gait problem and myalgias.  Neurological:  Positive for weakness and numbness.  All other systems reviewed and are negative.   Blood pressure 115/78, pulse 83, temperature 97.7 F (36.5 C), resp. rate 18, height 5\' 4"  (1.626 m), weight 94.3 kg, SpO2 97 %. Physical Exam Constitutional:      Appearance: Normal appearance.  HENT:     Head: Normocephalic and atraumatic.     Right Ear: Tympanic membrane, ear canal and external ear normal.     Left Ear: Tympanic membrane, ear canal and external ear normal.     Nose: Nose normal.     Mouth/Throat:     Mouth: Mucous membranes are moist.     Pharynx: Oropharynx is clear.  Eyes:     Extraocular Movements: Extraocular movements intact.     Conjunctiva/sclera: Conjunctivae normal.     Pupils: Pupils are equal, round, and reactive to light.  Cardiovascular:     Rate and Rhythm: Normal rate and regular rhythm.     Pulses: Normal pulses.     Heart sounds: Normal heart sounds.  Pulmonary:     Effort: Pulmonary effort is normal.     Breath sounds: Normal breath sounds.  Abdominal:     General: Abdomen is flat. Bowel sounds are normal.     Palpations: Abdomen is soft.  Musculoskeletal:     Cervical back: Normal range of motion and neck supple.     Comments: Straight leg raising is positive at 30 degrees Patrick's maneuver is negative to 80 degrees bilaterally  Skin:    General: Skin is warm and dry.     Capillary Refill: Capillary refill takes less than 2 seconds.  Neurological:     Mental Status: She is alert.     Comments: Weakness in iliopsoas quadriceps tibialis anterior and gastrocs all graded 4- out of 5 absent deep tendon  reflexes in the lower extremities.  Sensation is diminished below the level of the knees bilaterally to pin light touch and vibration.  Upper extremity strength and reflexes are intact as is the cranial nerve examination.  Psychiatric:        Mood and Affect: Mood normal.        Behavior: Behavior normal.        Thought Content: Thought content normal.        Judgment: Judgment normal.      Assessment/Plan Degenerative scoliosis status post arthrodesis T10 to the sacrum with pseudoarthrosis at T9-10 T10-11 stenosis T8-T9 L3 radiculopathy on the left.  Plan is to vies the pseudoarthrosis with fixation up to the T7 level decompress T8-T9 and remove the left L3 pedicle screw.  Arthrodesis will be performed with allograft and bone morphogenic protein in addition to methacrylate stabilization of the screws in the T7-T8 and T9 levels.  Earleen Newport, MD 09/30/2022, 7:52 AM

## 2022-09-30 NOTE — Anesthesia Postprocedure Evaluation (Signed)
Anesthesia Post Note  Patient: Kelsey Keller  Procedure(s) Performed: Revision of pseudoarthrosis at Thoracic Ten-Thoracic Eleven, Decompression of Thoracic Eight-Thoracic Nine,Thoracic Nine-Thoracic Ten, Thoracic Ten-Thoracic Eleven, Pedicle fixation from Thoracic Seven-Lumbar One, Removal of Left Lumbar Three screw (Back)     Patient location during evaluation: PACU Anesthesia Type: General Level of consciousness: awake and alert Pain management: pain level controlled Vital Signs Assessment: post-procedure vital signs reviewed and stable Respiratory status: spontaneous breathing, nonlabored ventilation, respiratory function stable and patient connected to nasal cannula oxygen Cardiovascular status: blood pressure returned to baseline and stable Postop Assessment: no apparent nausea or vomiting Anesthetic complications: no Comments: Pt noted upon induction to have afib, pt asymptomatic. Formal 12 lead EKG obtained in PACU. Cards Consulted by Dr Ellene Route   No notable events documented.  Last Vitals:  Vitals:   09/30/22 1445 09/30/22 1500  BP: (!) 97/56 (!) 99/58  Pulse: 87 88  Resp: 14 18  Temp:    SpO2: 99% 98%    Last Pain:  Vitals:   09/30/22 1415  PainSc: [place] Karimah Winquist

## 2022-09-30 NOTE — Plan of Care (Signed)
Patient ambulated well in room once arrived to the floor.   Problem: Education: Goal: Knowledge of General Education information will improve Description: Including pain rating scale, medication(s)/side effects and non-pharmacologic comfort measures Outcome: Progressing   Problem: Activity: Goal: Risk for activity intolerance will decrease Outcome: Progressing   Problem: Pain Managment: Goal: General experience of comfort will improve Outcome: Progressing   Problem: Safety: Goal: Ability to remain free from injury will improve Outcome: Progressing   Problem: Skin Integrity: Goal: Risk for impaired skin integrity will decrease Outcome: Progressing

## 2022-09-30 NOTE — Anesthesia Procedure Notes (Signed)
Procedure Name: Intubation Date/Time: 09/30/2022 8:19 AM  Performed by: Cathren Harsh, CRNAPre-anesthesia Checklist: Patient identified, Emergency Drugs available, Suction available and Patient being monitored Patient Re-evaluated:Patient Re-evaluated prior to induction Oxygen Delivery Method: Circle System Utilized Preoxygenation: Pre-oxygenation with 100% oxygen Induction Type: IV induction Ventilation: Mask ventilation without difficulty and Oral airway inserted - appropriate to patient size Laryngoscope Size: Mac and 4 Grade View: Grade I Tube type: Oral Tube size: 7.0 mm Number of attempts: 1 Airway Equipment and Method: Stylet and Oral airway Placement Confirmation: ETT inserted through vocal cords under direct vision, positive ETCO2 and breath sounds checked- equal and bilateral Secured at: 21 cm Tube secured with: Tape Dental Injury: Teeth and Oropharynx as per pre-operative assessment

## 2022-09-30 NOTE — Op Note (Signed)
Date of surgery: 09/30/2022 Preoperative diagnosis: Pseudoarthrosis T10 and T11, thoracic myelopathy T8-T9 T9-T10, left L3 radiculopathy secondary to misplaced hardware. Postoperative diagnosis: Same Procedure: #1 removal of left L3 pedicle screw.  #2 decompression of T8-9 T9-10 thoracic spinal stenosis.  #3 removal of the T10 pedicle screws #4 revision of arthrodesis with fixation posteriorly T7-8 T8-9 T9-10 to previous hardware from T10 to the sacrum. Posterior arthrodesis with autograft and allograft and Prody os T7-T11 Surgeon: Kristeen Miss First Assistant: Earnie Larsson, MD Anesthesia: General endotracheal Indications: Kelsey Keller is a 82 year old individual whose had previous surgery to decompress and stabilize a degenerative scoliosis.  She has had fixation from T10 to the sacrum and has evidence of a pseudoarthrosis at the T10-T11 level.  She also has developed significant thoracic myelopathy with cord compression at T8-9 and T9-10.  There is also misplacement of the pedicle screw on the left side at L3 which has been giving her left lumbar radiculopathy.  After recent myelogram I discussed the findings salient to her pain problems and difficulties with walking and noted that the myelopathy should be decompressed the thoracic spine should be stabilized across the pseudoarthrosis and the L3 pedicle screw should be removed.  These are all being performed now.  Procedure: Patient was brought to the operating room supine on the stretcher.  After the smooth induction of general endotracheal anesthesia the back was cleansed with alcohol.  Fluoroscopic guidance was then used to localize the L3 pedicle screw on the left side the T10 pedicle screws and the vertebrae up to T7.  The back was then prepped with DuraPrep and draped in a sterile fashion to expose these areas.  The procedure was started by making an incision over the left L3 pedicle screw.  The dissection was taken down through the fascia to  expose the screw head as it was attached to the rod.  The screw head was released.  The screw was mobile underneath the hardware however it could not be moved so much that it could be released from under the hardware.  Then a high-speed metal cutting bit was used to remove 1 part of the tulip so that the screw could be slid laterally off the rod.  Once this was performed the screw could easily be unscrewed.  The soft tissues were carefully protected but debridement of the soft tissues from all the metal fragments was performed as thoroughly as possible hemostasis was then carefully achieved and the fascia overlying this area was closed with #1 Vicryl 2-0 Vicryl in the subcutaneous tissues and 3-0 Vicryl subcuticularly.  Dermabond was later placed on the skin.  The second part of the procedure was then started by making a vertical incision over the thoracic spine.  The hardware at T10 was then exposed.  The screw heads were exposed and loosened.  The rods were noted to be protruding rather dorsally.  The midline incision was then continued cephalad and a subperiosteal dissection was undertaken to expose the spinous processes and laminar arches of T7-T8-T9 down to the hardware at T10.  The soft tissues were removed in this area so as to access the T10 screws.  The T10 screws were each noted to be rather loose.  Then using fluoroscopic guidance we placed pedicle screws at T7-T8 and T9.  These were done with fluoroscopic guidance using pedicle probes bilaterally and 5.5 x 45 mm screws were placed in each of the vertebrae on both sides.  At the total of 6 screws the screws  were fenestrated and cannulated.  Then once the screw placement was felt to be adequate methacrylate cement was mixed to appropriate consistency and a cannula with 1 cc of cement was placed into each of the screw heads under fluoroscopic guidance.  This yielded good stability of the screws in the bone.  Next we achieved fixation by using a bolt  cutters Thiel rod cutter to remove the rod just below the T10 screw.  The T10 screw was removed.  The rods were then bent using an in situ bender to accommodate extension of the fixation to the level of T7.  A straight connector was used to connect a new rod which was a straight rod on the left side and a contoured rod on the right side between the screw heads of T7-T8 and T9 and the straight connector.  Once these were fashion and checked in place tension was turned to the decompression at T8-9 and T9-T10 on the left side a hemilaminectomy was performed to expose the common dural tube removing substantial ligamentous material and facet hypertrophy this was done through the left-sided approach and reached over to the right side to achieve an adequate decompression high-speed drill was used and the bone was harvested for use and grafting.  This bone was mixed with 40 cc of cortical cancellous bone that was mixed with 10 cc of Prody os.  Then once all the hardware was fixed in position and locked down the interspinous and intra facet areas at T7-8 T8-9 T9-10 and T10-T11 were decorticated using high-speed drill.  The bony mixture was then packed into each of these gutters laterally and care was taken to make sure that no in the bone was placed into the laminotomies defect on the left side at T8-9 and T9-10.  Once all the bone graft was packed final radiographs were obtained in AP and lateral projection.  Hemostasis in the soft tissues was meticulously obtained and then the thoracodorsal fascia was closed with #1 Vicryl interrupted fashion 2-0 Vicryl was used in the subcutaneous tissues 3-0 Vicryl subcuticularly and 4-0 Vicryl for the final subcuticular closure Dermabond was placed on the skin blood loss was estimated 350 cc for the entirety of the procedure.  No Cell Saver blood was able to be returned to the patient as not enough was harvested.  The patient tolerated procedure well was returned to recovery room in  stable condition.

## 2022-10-01 DIAGNOSIS — I4819 Other persistent atrial fibrillation: Secondary | ICD-10-CM

## 2022-10-01 DIAGNOSIS — D649 Anemia, unspecified: Secondary | ICD-10-CM

## 2022-10-01 LAB — BASIC METABOLIC PANEL
Anion gap: 11 (ref 5–15)
BUN: 32 mg/dL — ABNORMAL HIGH (ref 8–23)
CO2: 23 mmol/L (ref 22–32)
Calcium: 8.8 mg/dL — ABNORMAL LOW (ref 8.9–10.3)
Chloride: 99 mmol/L (ref 98–111)
Creatinine, Ser: 1.16 mg/dL — ABNORMAL HIGH (ref 0.44–1.00)
GFR, Estimated: 48 mL/min — ABNORMAL LOW (ref >60)
Glucose, Bld: 237 mg/dL — ABNORMAL HIGH (ref 70–99)
Potassium: 4.9 mmol/L (ref 3.5–5.1)
Sodium: 133 mmol/L — ABNORMAL LOW (ref 135–145)

## 2022-10-01 LAB — CBC
HCT: 22.7 % — ABNORMAL LOW (ref 36.0–46.0)
Hemoglobin: 7.2 g/dL — ABNORMAL LOW (ref 12.0–15.0)
MCH: 28 pg (ref 26.0–34.0)
MCHC: 31.7 g/dL (ref 30.0–36.0)
MCV: 88.3 fL (ref 80.0–100.0)
Platelets: 113 10*3/uL — ABNORMAL LOW (ref 150–400)
RBC: 2.57 MIL/uL — ABNORMAL LOW (ref 3.87–5.11)
RDW: 15.3 % (ref 11.5–15.5)
WBC: 7.2 10*3/uL (ref 4.0–10.5)
nRBC: 0 % (ref 0.0–0.2)

## 2022-10-01 LAB — PREPARE RBC (CROSSMATCH)

## 2022-10-01 LAB — GLUCOSE, CAPILLARY
Glucose-Capillary: 182 mg/dL — ABNORMAL HIGH (ref 70–99)
Glucose-Capillary: 175 mg/dL — ABNORMAL HIGH (ref 70–99)
Glucose-Capillary: 212 mg/dL — ABNORMAL HIGH (ref 70–99)
Glucose-Capillary: 155 mg/dL — ABNORMAL HIGH (ref 70–99)

## 2022-10-01 LAB — TSH: TSH: 0.872 u[IU]/mL (ref 0.350–4.500)

## 2022-10-01 MED ORDER — SODIUM CHLORIDE 0.9% IV SOLUTION: Freq: Once | INTRAVENOUS | Status: AC

## 2022-10-01 MED FILL — Thrombin For Soln 5000 Unit: CUTANEOUS | Qty: 5000 | Status: AC

## 2022-10-01 NOTE — Progress Notes (Signed)
OT Cancellation Note  Patient Details Name: Kelsey Keller MRN: [id number] DOB: [birth year]   Cancelled Treatment:    Reason Eval/Treat Not Completed: Patient at procedure or test/ unavailable;Other (comment) (Pt recieving blood transfusion, nauseated, significant pain, will attempt to see later)  [name] 10/01/2022, 12:16 PM

## 2022-10-01 NOTE — Evaluation (Signed)
Physical Therapy Evaluation Patient Details Name: Kelsey Keller MRN: [id number] DOB: [birth year] Today's Date: 10/01/2022  History of Present Illness  82 y.o. female admitted 3/18 for same day Revision of pseudoarthrosis at T10-11 Decompression of T8-9,T9-10, T10-11, Pedicle fixation from T7-L1, Removal of Left L3 screw. After surgery developed atrial fibrillation.  PMHx: hx of type 2 DM, HTN, HLD, GERD, osteoarthritis, scoliosis.  Clinical Impression  Patient is s/p above surgery resulting in functional limitations due to the deficits listed below (see PT Problem List). Ambulating in room with min guard assist, no buckling, pt reports LLE pain resolved post-op. Reviewed bed mobility techniques with log roll to maintain spinal precautions (min assist). Tolerated evaluation and training well. Awaiting PRBCs transfusion. Reports feeling lightheaded with staff earlier, no complaints during PT evaluation, back to bed for transfusion. Awaiting new TLSO which is being replaced for a different size. Patient plans to stay with daughter at d/c and will have 24/7 assist as needed. Patient will benefit from skilled PT to increase their independence and safety with mobility to allow discharge to the venue listed below.          Recommendations for follow up therapy are one component of a multi-disciplinary discharge planning process, led by the attending physician.  Recommendations may be updated based on patient status, additional functional criteria and insurance authorization.  Follow Up Recommendations Home health PT      Assistance Recommended at Discharge Set up Supervision/Assistance  Patient can return home with the following  A little help with walking and/or transfers;A little help with bathing/dressing/bathroom;Assistance with cooking/housework;Assist for transportation;Help with stairs or ramp for entrance    Equipment Recommendations None recommended by PT  Recommendations for Other  Services       Functional Status Assessment Patient has had a recent decline in their functional status and demonstrates the ability to make significant improvements in function in a reasonable and predictable amount of time.     Precautions / Restrictions Precautions Precautions: Back Precaution Booklet Issued: No Precaution Comments: Verbally reviewed. Required Braces or Orthoses: Spinal Brace Spinal Brace: Thoracolumbosacral orthotic;Applied in sitting position (ok to bathroom w/o brace) Restrictions Weight Bearing Restrictions: No      Mobility  Bed Mobility Overal bed mobility: Needs Assistance Bed Mobility: Rolling, Sidelying to Sit, Sit to Sidelying Rolling: Min guard Sidelying to sit: Min assist     Sit to sidelying: Min assist General bed mobility comments: Educated on log roll technique. Required cues to perform safely. Min assist for trunk to rise, and LEs to lower to sidelying.    Transfers Overall transfer level: Needs assistance Equipment used: Rolling walker (2 wheels) Transfers: Sit to/from Stand Sit to Stand: Min guard           General transfer comment: Min guard for safety, slow to rise, performed with RW for support.    Ambulation/Gait Ambulation/Gait assistance: Min guard Gait Distance (Feet): 25 Feet Assistive device: Rolling walker (2 wheels) Gait Pattern/deviations: Step-through pattern, Decreased stride length, Trunk flexed Gait velocity: decr Gait velocity interpretation: <1.8 ft/sec, indicate of risk for recurrent falls   General Gait Details: Min guard for safety, no buckling or overt LOB note with fair use of RW for support. Educated on posture, RW placement. Good control. Denies dizziness.  Stairs            Wheelchair Mobility    Modified Rankin (Stroke Patients Only)       Balance Overall balance assessment: Needs assistance Sitting-balance support: No upper  extremity supported, Feet supported Sitting balance-Leahy  Scale: Good     Standing balance support: Bilateral upper extremity supported, Reliant on assistive device for balance Standing balance-Leahy Scale: Poor                               Pertinent Vitals/Pain Pain Assessment Pain Assessment: No/denies pain    Home Living Family/patient expects to be discharged to:: Private residence Living Arrangements: Alone (Plans to stay with daughter at d/c) Available Help at Discharge: Family;Available 24 hours/day Type of Home: House Home Access: Stairs to enter Entrance Stairs-Rails: Right;Left;Can reach both Entrance Stairs-Number of Steps: 4   Home Layout: One level Home Equipment: Conservation officer, nature (2 wheels);Cane - single point;BSC/3in1      Prior Function Prior Level of Function : Independent/Modified Independent             Mobility Comments: lived alone and care for herself PTA. pain in LLE, used SPC at times, mild instability but no falls. ADLs Comments: ind     Hand Dominance        Extremity/Trunk Assessment   Upper Extremity Assessment Upper Extremity Assessment: Defer to OT evaluation    Lower Extremity Assessment Lower Extremity Assessment: Generalized weakness (Not formally testsed, no buckling noted in WB.)       Communication   Communication: No difficulties  Cognition Arousal/Alertness: Awake/alert Behavior During Therapy: WFL for tasks assessed/performed Overall Cognitive Status: Within Functional Limits for tasks assessed                                          General Comments General comments (skin integrity, edema, etc.): Reviewed back precautions, pt familiar from prior surgery but needs cuse. HR to 120s while ambulating in room.    Exercises     Assessment/Plan    PT Assessment Patient needs continued PT services  PT Problem List Decreased strength;Decreased range of motion;Decreased activity tolerance;Decreased balance;Decreased mobility;Decreased knowledge of  use of DME;Decreased knowledge of precautions;Cardiopulmonary status limiting activity;Obesity;Pain       PT Treatment Interventions DME instruction;Gait training;Stair training;Functional mobility training;Therapeutic activities;Therapeutic exercise;Balance training;Neuromuscular re-education;Patient/family education;Modalities    PT Goals (Current goals can be found in the Care Plan section)  Acute Rehab PT Goals Patient Stated Goal: Get well, go home PT Goal Formulation: With patient Time For Goal Achievement: 10/08/22 Potential to Achieve Goals: Good    Frequency Min 4X/week     Co-evaluation               AM-PAC PT "6 Clicks" Mobility  Outcome Measure Help needed turning from your back to your side while in a flat bed without using bedrails?: A Little Help needed moving from lying on your back to sitting on the side of a flat bed without using bedrails?: A Little Help needed moving to and from a bed to a chair (including a wheelchair)?: A Little Help needed standing up from a chair using your arms (e.g., wheelchair or bedside chair)?: A Little Help needed to walk in hospital room?: A Little Help needed climbing 3-5 steps with a railing? : A Little 6 Click Score: 18    End of Session Equipment Utilized During Treatment: Gait belt Activity Tolerance: Patient tolerated treatment well Patient left: in bed;with call bell/phone within reach;with bed alarm set;with nursing/sitter in room (Lab present) Nurse Communication:  Mobility status PT Visit Diagnosis: Unsteadiness on feet (R26.81);Other abnormalities of gait and mobility (R26.89);Muscle weakness (generalized) (M62.81);Difficulty in walking, not elsewhere classified (R26.2);Pain Pain - part of body:  (back)    Time: [id number] PT Time Calculation (min) (ACUTE ONLY): 16 min   Charges:   PT Evaluation $PT Eval Low Complexity: 1 Low          Candie Mile, PT, DPT Physical Therapist Acute Rehabilitation  Services [facility]   Ellouise Newer 10/01/2022, 11:49 AM

## 2022-10-01 NOTE — Progress Notes (Signed)
Inpatient Rehabilitation Admissions Coordinator   Rehab consult received. Home health is recommended by PT. We are unable to pursue CIR admit . We will sign off.  Danne Baxter, RN, MSN Rehab Admissions Coordinator [phone] 10/01/2022 12:53 PM

## 2022-10-01 NOTE — Progress Notes (Addendum)
CARDIOLOGY ROUNDING PROGRESS NOTE  Patient Name: Kelsey Keller Date of Encounter: 10/01/2022  [facility] Cardiologist: Larae Grooms, MD  Patient Profile     82 y.o. female with a hx of T2DM, HTN, HLD, GERD, OA, scoliosis, no prior cardiac history who presented to [facility] on 3/18 for elective thoracic spine surgery for thoracic myelopathy c/b atrial fibrillation for which cardiology was consulted now post op day 1  Principal Problem:   Myelopathy concurrent with and due to spinal stenosis of thoracic region [facility])  Of note, patient had TTE with on 08/14/22 that showed EF of 60-65%, no regional wall motion abnormalities, grade II diastolic dysfunction, and normal RV systolic function. LA was moderately dilated, and AV with mild stenosis.   Subjective   O/N: Persistent HR >100 with decrease in Hgb. Primary team planning to transfuse patient today. Patient examined at bedside initially without chest pain, shortness of breath, nausea or vomiting.   She denies prior cardiac conditions such as sleep apnea or recent infections, elevated blood pressures leading to surgery, pulmonary conditions, prior MI or anginal chest pain, alcohol use. Her thyroid disease has been managed with Synthroid. Reports history of anemia prior to surgery as well.   Mild nausea. In the middle of examination, patient endorsed increased pain in her back and L sided chest without shortness of breath. She reported anxiety in setting not knowing why is triggering her atrial fibrillation. Patient denies receiving pain medication or antiemetics today.   ROS:  All other ROS reviewed and negative. Pertinent positives noted in the HPI.     Inpatient Medications  Scheduled Meds:  atenolol  50 mg Oral Daily   dapagliflozin propanediol  5 mg Oral QAC breakfast   docusate sodium  100 mg Oral BID   hydrochlorothiazide  25 mg Oral Daily   insulin aspart  0-20 Units Subcutaneous TID WC   levothyroxine  125 mcg Oral Q0600    metFORMIN  1,000 mg Oral BID WC   pantoprazole  40 mg Oral BID   pioglitazone  30 mg Oral Daily   senna  1 tablet Oral BID   sodium chloride flush  3 mL Intravenous Q12H   Continuous Infusions:  sodium chloride     lactated ringers 100 mL/hr at 09/30/22 1734   methocarbamol (ROBAXIN) IV     PRN Meds: acetaminophen **OR** acetaminophen, alum & mag hydroxide-simeth, bisacodyl, fluticasone, meclizine, menthol-cetylpyridinium **OR** phenol, methocarbamol **OR** methocarbamol (ROBAXIN) IV, morphine injection, ondansetron **OR** ondansetron (ZOFRAN) IV, oxyCODONE-acetaminophen, polyethylene glycol, polyvinyl alcohol, sodium chloride flush, sodium phosphate   Vital Signs    Vitals:   10/01/22 0836 10/01/22 0839 10/01/22 1220 10/01/22 1247  BP: 101/69 (!) 112/55 (!) 91/50 (!) 92/58  Pulse: 91  97   Resp:   (!) 22 (!) 23  Temp:   98.2 F (36.8 C) 98.5 F (36.9 C)  TempSrc:   Oral Oral  SpO2:   94%   Weight:      Height:        Intake/Output Summary (Last 24 hours) at 10/01/2022 1309 Last data filed at 09/30/2022 1611 Gross per 24 hour  Intake 111 ml  Output --  Net 111 ml      09/30/2022    5:57 AM 09/25/2022    8:09 AM 09/18/2022    8:52 AM  Last 3 Weights  Weight (lbs) 208 lb 207 lb 208 lb 3.2 oz  Weight (kg) 94.348 kg 93.895 kg 94.439 kg      Telemetry  Overnight telemetry shows atrial fibrillation with RVR with ventricular rate 100-130, which I personally reviewed.    Physical Exam   General: Well nourished, well developed, in no acute distress Head: Atraumatic, normal size  Eyes: PEERLA, EOMI  Neck: Supple, no JVD Endocrine: No thryomegaly Cardiac: Irregularly irregular rare; no murmurs, rubs, or gallops, no tenderness on anterior chest Lungs: Clear to auscultation bilaterally, no wheezing, rhonchi or rales  Abd: Soft, nontender, no hepatomegaly  Ext: No edema, pulses 2+ Musculoskeletal: Moving all extremities, full exam deferred in setting of pain Skin: Warm  and dry, no rashes   Neuro: Alert and oriented to person, place, time, and situation Psych: Normal mood and affect   ECG  The most recent ECG shows atrial fibrillation, which I personally reviewed.   Labs    High Sensitivity Troponin:  No results for input(s): "TROPONINIHS" in the last 720 hours.   Chemistry Recent Labs  Lab 09/30/22 1813 10/01/22 0121  NA 134* 133*  K 5.0 4.9  CL 103 99  CO2 24 23  GLUCOSE 274* 237*  BUN 32* 32*  CREATININE 1.07* 1.16*  CALCIUM 9.0 8.8*  GFRNONAA 53* 48*  ANIONGAP 7 11    Lipids No results for input(s): "CHOL", "TRIG", "HDL", "LABVLDL", "LDLCALC", "CHOLHDL" in the last 168 hours.  Hematology Recent Labs  Lab 09/30/22 1813 10/01/22 0121  WBC 6.5 7.2  RBC 2.90* 2.57*  HGB 7.8* 7.2*  HCT 26.0* 22.7*  MCV 89.7 88.3  MCH 26.9 28.0  MCHC 30.0 31.7  RDW 15.1 15.3  PLT 119* 113*   Thyroid  Recent Labs  Lab 10/01/22 0121  TSH 0.872    BNPNo results for input(s): "BNP", "PROBNP" in the last 168 hours.  DDimer No results for input(s): "DDIMER" in the last 168 hours.   Radiology    DG THORACOLUMBAR SPINE  Result Date: 09/30/2022 CLINICAL DATA:  Thoracic spine decompression with hardware EXAM: THORACOLUMBAR SPINE intraoperative fluoroscopy COMPARISON:  02/26/2022 x-ray FINDINGS: Two fluoroscopic spot images submitted for review demonstrate vertical fixation rods and pedicle screws along the visualized lower thoracic and upper lumbar spine. Imaging was obtained to aid in treatment. Please correlate with real-time fluoroscopy of 73.9 seconds. Cumulative dose 0.67469 mGy meter squared IMPRESSION: Intraoperative fluoroscopy Electronically Signed   By: Jill Side M.D.   On: 09/30/2022 16:00   DG C-Arm 1-60 Min-No Report  Result Date: 09/30/2022 Fluoroscopy was utilized by the requesting physician.  No radiographic interpretation.   DG C-Arm 1-60 Min-No Report  Result Date: 09/30/2022 Fluoroscopy was utilized by the requesting  physician.  No radiographic interpretation.   DG C-Arm 1-60 Min-No Report  Result Date: 09/30/2022 Fluoroscopy was utilized by the requesting physician.  No radiographic interpretation.   DG C-Arm 1-60 Min-No Report  Result Date: 09/30/2022 Fluoroscopy was utilized by the requesting physician.  No radiographic interpretation.   DG C-Arm 1-60 Min-No Report  Result Date: 09/30/2022 Fluoroscopy was utilized by the requesting physician.  No radiographic interpretation.    Cardiac Studies - Summarized   08/14/2022 2D ECHOCARDIOGRAM IMPRESSIONS   1. Left ventricular ejection fraction, by estimation, is 60 to 65%. The left ventricle has normal function. The left ventricle has no regional wall motion abnormalities. Left  ventricular diastolic parameters are consistent with Grade II diastolic dysfunction (pseudonormalization).   2. Right ventricular systolic function is normal. The right ventricular size is normal. There is normal pulmonary artery systolic pressure.   3. Left atrial size was moderately dilated.  4. Right atrial  size was mildly dilated.  5. The mitral valve is normal in structure. Trivial mitral valve regurgitation. No evidence of mitral stenosis.  6. The aortic valve is calcified. There is moderate calcification of the aortic valve. There is moderate thickening of the aortic valve. Aortic valve regurgitation is trivial. Mild aortic valve stenosis. Aortic valve mean gradient measures 16.3 mmHg.  7. The inferior vena cava is normal in size with greater than 50%  respiratory variability, suggesting right atrial pressure of 3 mmHg.   Comparison(s): No prior Echocardiogram.  Conclusion(s)/Recommendation(s): Mild aortic stenosis, aortic valve with  significant calcification/restriction of NCC/LCC suggesting valve is  functionally bicuspid.     Assessment & Plan   New onset Atrial fibrillation Likely etiology is post procedural Patient with other cardiovascular risks -No signs  of ischemia on EKG and normal LV EF, moderately dilated LA, trivial MR and aortic sclerosis on 2D echocardiogram -Stable known thyroid disease with medical management -HR mildly elevated above 100 -Electrolytes wnl; continue to monitor and replete -CHA2DS2-VASc Score = 5 for HTN, DM, Age, and gender Not currently on anticoagulation; will need AC if Afib continues and per neurosurgery given post op state -Patient with drop in hgb, in pain and nauseated today, also contributing to elevated HR\  HR decreased <100 after pain medications -Will continue Atenolol 50 mg  Do not favor escalating rate control therapies with response to pain management -Continue to monitor on cardiac telemetry   Pseudoarthrosis T10 and T11  thoracic myelopathy T8-T9 T9-T10  left L3 radiculopathy secondary to misplaced hardware S/p:  #1 removal of left L3 pedicle screw #2 decompression of T8-9 T9-10 thoracic spinal stenosis.   #3 removal of the T10 pedicle screws  #4 revision of arthrodesis with fixation posteriorly T7-8 T8-9 T9-10 to previous hardware from T10 to the sacrum.Posterior arthrodesis with autograft and allograft and Prody os T7-T11 - Management per primary team - Pain and nausea control s/p surgery with percocet 5-325 mg q6 HR   HTN Continue on Atenolol 50 mg Normal TSH on medication  Microcytic Anemia Hgb 7.2 today -F/u H/H post 2 units of pRBCs  HTN -Continue on Hydrochlorothiazide 25 mg  -Continue on Atenolol   Hypothyroidism -Last TSH today: 0.8 -Continue on Synthroid 125 mcg daily  T2DM -Currently on Metformin 1000 BID -Pioglitazone 30 mg daily -Farxiga 5 mg    CHA2DS2-VASc Score = 5   This indicates a 7.2% annual risk of stroke. The patient's score is based upon: CHF History: 0 HTN History: 1 Diabetes History: 1 Stroke History: 0 Vascular Disease History: 0 Age Score: 2 Gender Score: [place], [place] [facility] Program 10/01/2022,  1:09 PM  I have examined the patient and reviewed assessment and plan and discussed with patient.  Agree with above as stated.    Still in AFib.  Hbg decreased.  Getting transfusion of 2U PRBC today.  No anticoagulation as of yet due to surgery.  Will check with surgery if heparin could be started tomorrow if patient remains in AFib.    Larae Grooms   For questions or updates, please contact [place] Please consult [email or web address] for contact info under

## 2022-10-01 NOTE — Plan of Care (Signed)

## 2022-10-01 NOTE — Progress Notes (Signed)
Patient ID: Kelsey Keller, female   DOB: [birth year], 82 y.o.   MRN: [id number] Afib post op persists heart rate 90-120 more with slight exertion. Post of hgb7.2 with hct 22 down from 30.  Will transfuse 2 units prbcs.  Motor function and left leg pain seem better. Ambulation and stability leave a little to be desired. Will ask rehab to see. Dressing on back is clean dry.

## 2022-10-01 NOTE — Progress Notes (Addendum)
Occupational Therapy Evaluation Patient Details Name: Kelsey Keller MRN: [id number] DOB: Aug 15, 1940 Today's Date: 10/01/2022   History of Present Illness 82 y.o. female admitted 3/18 for same day Revision of pseudoarthrosis at T10-11 Decompression of T8-9,T9-10, T10-11, Pedicle fixation from T7-L1, Removal of Left L3 screw. After surgery developed atrial fibrillation.  PMHx: hx of type 2 DM, HTN, HLD, GERD, osteoarthritis, scoliosis.   Clinical Impression   Pt tolerated session well, motivated for therapy and actively participated. Pt instructed on and able to verbalize back precautions during session. Pt is unable to participate in ADLs at [facility] due to back precautions, Pt would benefit from continued skilled therapy and family/caregiver training for donning/doffing back brace to ensure carryover of safety precautions, daughter agreed to participate on 3/19 at 10-11am. Pt would benefit from [facility] for continuation of care.     Recommendations for follow up therapy are one component of a multi-disciplinary discharge planning process, led by the attending physician.  Recommendations may be updated based on patient status, additional functional criteria and insurance authorization.   Follow Up Recommendations  Home health OT     Assistance Recommended at Discharge Set up Supervision/Assistance  Patient can return home with the following A little help with walking and/or transfers;A little help with bathing/dressing/bathroom;Assistance with cooking/housework;Assist for transportation;Help with stairs or ramp for entrance    Functional Status Assessment  Patient has had a recent decline in their functional status and demonstrates the ability to make significant improvements in function in a reasonable and predictable amount of time.  Equipment Recommendations  None recommended by OT    Recommendations for Other Services       Precautions / Restrictions Precautions Precautions:  Back Precaution Booklet Issued: Yes (comment) Precaution Comments: verbally reviewed, went over handout Required Braces or Orthoses: Spinal Brace Spinal Brace: Thoracolumbosacral orthotic;Applied in sitting position;Other (comment) Spinal Brace Comments: new brace too small to don while sitting, may need extension straps, hanger contacted and will bring straps to Pt Restrictions Weight Bearing Restrictions: No      Mobility Bed Mobility Overal bed mobility: Needs Assistance Bed Mobility: Rolling, Sidelying to Sit, Sit to Sidelying Rolling: Min guard Sidelying to sit: Min assist     Sit to sidelying: Min assist General bed mobility comments: reiterated log roll technique, requires assistance to sit up from supine    Transfers Overall transfer level: Needs assistance Equipment used: Rolling walker (2 wheels) Transfers: Sit to/from Stand Sit to Stand: Min guard           General transfer comment: Min guard for safety, slow to rise, performed with RW for support.      Balance Overall balance assessment: Needs assistance Sitting-balance support: No upper extremity supported, Feet supported Sitting balance-Leahy Scale: Good     Standing balance support: Bilateral upper extremity supported, Reliant on assistive device for balance Standing balance-Leahy Scale: Fair Standing balance comment: Pt was able to stand and perform perineal hygiene at toilet unsupported with RW for support as needed.                           ADL either performed or assessed with clinical judgement   ADL Overall ADL's : Needs assistance/impaired Eating/Feeding: Set up   Grooming: Set up   Upper Body Bathing: Min guard   Lower Body Bathing: Moderate assistance   Upper Body Dressing : Set up   Lower Body Dressing: Moderate assistance   Toilet Transfer: Min guard  Toileting- Water quality scientist and Hygiene: Min guard   Tub/ Banker: Min guard   Functional mobility  during ADLs: Min guard General ADL Comments: Pt back precautions prevents her from bending down to reach BLEs limiting LB ADLs. Pt requires min guard for standing for safety, able to perform perineal hygiene while standing with increased effort and RW for support     Vision Baseline Vision/History: 1 Wears glasses;4 Cataracts Ability to See in Adequate Light: 0 Adequate Patient Visual Report: No change from baseline       Perception     Praxis      Pertinent Vitals/Pain Pain Assessment Pain Assessment: 0-10 Pain Score: 3  Pain Location: back Pain Descriptors / Indicators: Aching     Hand Dominance     Extremity/Trunk Assessment Upper Extremity Assessment Upper Extremity Assessment: Overall WFL for tasks assessed   Lower Extremity Assessment Lower Extremity Assessment: Defer to PT evaluation       Communication Communication Communication: No difficulties   Cognition Arousal/Alertness: Awake/alert Behavior During Therapy: WFL for tasks assessed/performed Overall Cognitive Status: Within Functional Limits for tasks assessed                                       General Comments  HR 106/67 supine and sitting, 96/47 standing with no symptoms, no fatigue/lightheadedness/dizziness.  Pt able to ambulate ~100 feet with min guard, tolerated well, cueing to stand up straight.    Exercises     Shoulder Instructions      Home Living Family/patient expects to be discharged to:: Private residence Living Arrangements: Alone Available Help at Discharge: Family;Available 24 hours/day Type of Home: House Home Access: Stairs to enter [employer] of Steps: 4 Entrance Stairs-Rails: Right;Left;Can reach both Home Layout: One level     Bathroom Shower/Tub: Tub/shower unit;Walk-in shower   Bathroom Toilet: Standard Bathroom Accessibility: Yes How Accessible: Accessible via walker Home Equipment: [place] (2 wheels);Cane - single  point;BSC/3in1   Additional Comments: Pt is going to stay with daughter until able to return home, daughter is availiable 24/7.      Prior Functioning/Environment Prior Level of Function : Independent/Modified Independent             Mobility Comments: lived alone and care for herself PTA. pain in LLE, used SPC at times, walked in very bent forward position prior to admission ADLs Comments: mod I        OT Problem List: Decreased strength;Decreased range of motion;Decreased activity tolerance;Impaired balance (sitting and/or standing);Pain      OT Treatment/Interventions: Self-care/ADL training;Energy conservation;DME and/or AE instruction;Patient/family education;Therapeutic activities;Therapeutic exercise    OT Goals(Current goals can be found in the care plan section) Acute Rehab OT Goals Patient Stated Goal: to reduce pain/numbness in LEs and improve mobility OT Goal Formulation: With patient Time For Goal Achievement: 10/15/22 Potential to Achieve Goals: Good ADL Goals Pt Will Perform Lower Body Dressing: with min assist;bed level;with adaptive equipment Pt Will Perform Toileting - Clothing Manipulation and hygiene: with modified independence;with adaptive equipment;sit to/from stand Pt Will Perform Tub/Shower Transfer: with modified independence;shower seat;rolling walker;ambulating Additional ADL Goal #1: Pt and caregiver will be able to verbalize and demonstrate current donning/doffing of back brace and precautions.  OT Frequency: Min 2X/week    Co-evaluation              AM-PAC OT "6 Clicks" Daily Activity  Outcome Measure Help from another person eating meals?: A Little Help from another person taking care of personal grooming?: A Little Help from another person toileting, which includes using toliet, bedpan, or urinal?: A Little Help from another person bathing (including washing, rinsing, drying)?: A Lot Help from another person to put on and taking off  regular upper body clothing?: A Little Help from another person to put on and taking off regular lower body clothing?: A Lot 6 Click Score: 16   End of Session Equipment Utilized During Treatment: Rolling walker (2 wheels);Gait belt;Back brace Nurse Communication: Mobility status  Activity Tolerance: Patient tolerated treatment well Patient left: in bed;with call bell/phone within reach  OT Visit Diagnosis: Unsteadiness on feet (R26.81);Other abnormalities of gait and mobility (R26.89);Pain;Muscle weakness (generalized) (M62.81) Pain - part of body:  (back)                Time: 1420-1500 OT Time Calculation (min): 40 min Charges:  OT General Charges $OT Visit: 1 Visit OT Evaluation $OT Eval Moderate Complexity: 1 Mod OT Treatments $Self Care/Home Management : 23-37 mins  [place] 10/01/2022, 4:27 PM

## 2022-10-01 NOTE — Progress Notes (Signed)
MEWS Progress Note  Patient Details Name: Kelsey Keller MRN: [id number] DOB: 06/07/1941 Today's Date: 10/01/2022   MEWS Flowsheet Documentation:  Assess: MEWS Score Temp: 98.2 F (36.8 C) BP: (!) 91/50 MAP (mmHg): (!) 63 Pulse Rate: 97 ECG Heart Rate: (!) 105 Resp: (!) 22 Level of Consciousness: Alert SpO2: 94 % O2 Device: Room Air Patient Activity (if Appropriate): In bed O2 Flow Rate (L/min): 2 L/min Assess: MEWS Score MEWS Temp: 0 MEWS Systolic: 1 MEWS Pulse: 0 MEWS RR: 1 MEWS LOC: 0 MEWS Score: 2 MEWS Score Color: Yellow Assess: SIRS CRITERIA SIRS Temperature : 0 SIRS Respirations : 1 SIRS Pulse: 1 SIRS WBC: 0 SIRS Score Sum : 2 SIRS Temperature : 0 SIRS Pulse: 1 SIRS Respirations : 1 SIRS WBC: 0 SIRS Score Sum : 2 Assess: if the MEWS score is Yellow or Red Were vital signs taken at a resting state?: Yes Focused Assessment: No change from prior assessment Does the patient meet 2 or more of the SIRS criteria?: No MEWS guidelines implemented : Yes, yellow Treat MEWS Interventions: Considered administering scheduled or prn medications/treatments as ordered Take Vital Signs Increase Vital Sign Frequency : Yellow: Q2hr x1, continue Q4hrs until patient remains green for 12hrs Escalate MEWS: Escalate: Yellow: Discuss with charge nurse and consider notifying provider and/or RRT Notify: Charge Nurse/RN Name of Charge Nurse/RN Notified: This RN Provider Notification Provider Name/Title: [place] Date Provider Notified: 10/01/22 Time Provider Notified: [id number] Method of Notification: Face-to-face Notification Reason: Other (Comment) (decrease BP) Provider response: At bedside Date of Provider Response: 10/01/22 Time of Provider Response: 1220  Dr. Ellene Route ordered 2 units of RBCs. Patient pain decreased and resting well.   Karna Christmas 10/01/2022, 12:43 PM

## 2022-10-01 NOTE — Plan of Care (Signed)

## 2022-10-02 ENCOUNTER — Other Ambulatory Visit (HOSPITAL_COMMUNITY): Payer: Self-pay

## 2022-10-02 LAB — BASIC METABOLIC PANEL
Anion gap: 9 (ref 5–15)
BUN: 29 mg/dL — ABNORMAL HIGH (ref 8–23)
CO2: 24 mmol/L (ref 22–32)
Calcium: 9.4 mg/dL (ref 8.9–10.3)
Chloride: 99 mmol/L (ref 98–111)
Creatinine, Ser: 1.14 mg/dL — ABNORMAL HIGH (ref 0.44–1.00)
GFR, Estimated: 49 mL/min — ABNORMAL LOW (ref >60)
Glucose, Bld: 195 mg/dL — ABNORMAL HIGH (ref 70–99)
Potassium: 3.6 mmol/L (ref 3.5–5.1)
Sodium: 132 mmol/L — ABNORMAL LOW (ref 135–145)

## 2022-10-02 LAB — CBC
HCT: 29.6 % — ABNORMAL LOW (ref 36.0–46.0)
Hemoglobin: 9.3 g/dL — ABNORMAL LOW (ref 12.0–15.0)
MCH: 27.4 pg (ref 26.0–34.0)
MCHC: 31.4 g/dL (ref 30.0–36.0)
MCV: 87.1 fL (ref 80.0–100.0)
Platelets: 129 10*3/uL — ABNORMAL LOW (ref 150–400)
RBC: 3.4 MIL/uL — ABNORMAL LOW (ref 3.87–5.11)
RDW: 15.7 % — ABNORMAL HIGH (ref 11.5–15.5)
WBC: 8.8 10*3/uL (ref 4.0–10.5)
nRBC: 0 % (ref 0.0–0.2)

## 2022-10-02 LAB — TYPE AND SCREEN
ABO/RH(D): O POS
Antibody Screen: NEGATIVE
Unit division: 0
Unit division: 0

## 2022-10-02 LAB — BPAM RBC
Blood Product Expiration Date: 202404172359
Blood Product Expiration Date: 202404172359
ISSUE DATE / TIME: 202403191224
ISSUE DATE / TIME: 202403191542
Unit Type and Rh: 5100
Unit Type and Rh: 5100

## 2022-10-02 LAB — GLUCOSE, CAPILLARY
Glucose-Capillary: 206 mg/dL — ABNORMAL HIGH (ref 70–99)
Glucose-Capillary: 210 mg/dL — ABNORMAL HIGH (ref 70–99)
Glucose-Capillary: 167 mg/dL — ABNORMAL HIGH (ref 70–99)
Glucose-Capillary: 156 mg/dL — ABNORMAL HIGH (ref 70–99)

## 2022-10-02 MED ORDER — APIXABAN 5 MG PO TABS
5.0000 mg | ORAL_TABLET | Freq: Two times a day (BID) | ORAL | Status: DC
  Administered 2022-10-02 – 2022-10-03 (×3): 5 mg via ORAL
  Filled 2022-10-02 (×3): qty 1

## 2022-10-02 NOTE — TOC Initial Note (Signed)
Transition of Care [facility]) - Initial/Assessment Note    Patient Details  Name: Kelsey Keller MRN: [id number] Date of Birth: [birth year]  Transition of Care [facility]) CM/SW Contact:    Sharin Mons, RN Phone Number: 10/02/2022, 7:28 AM  Clinical Narrative:               - s/p  revision of pseudoarthrosis at T10-11 Decompression of T8-9,T9-10, T10-11, Pedicle fixation from T7-L1, Removal of Left L3 screw, 3/18  From home alone. Pt lans to transition to daughter's place of residence to recovery: [place], [place]. Pt without DME needs. Agreeable to home health services. Pt without provider preference. Referral made with [facility], acceptance pending. Pt will need F2F completed by MD for home health PT/OT services.  Pt without transportation issues. Daughter to provide transportation to home once d/c ready. Pt without RX meds concerns.  TOC team monitoring and will continue to assist with needs...  Expected Discharge Plan: [place] Barriers to Discharge: Continued Medical Work up   Patient Goals and CMS Choice     Choice offered to / list presented to : Patient      Expected Discharge Plan and Services                                   HH Arranged: PT, OT [facility] Agency: [place] Date [place]: 10/02/22 Time HH Agency Contacted: [phone] Representative spoke with at [place]: Tommi Rumps  Prior Living Arrangements/Services   Lives with:: Self Patient language and need for interpreter reviewed:: Yes Do you feel safe going back to the place where you live?: Yes      Need for Family Participation in Patient Care: Yes (Comment) Care giver support system in place?: Yes (comment) Current home services: DME (RW, BSC, cane) Criminal Activity/Legal Involvement Pertinent to Current Situation/Hospitalization: No - Comment as needed  Activities of Daily Living Home Assistive Devices/Equipment: Cane (specify quad or  straight), Walker (specify type) ADL Screening (condition at time of admission) Patient's cognitive ability adequate to safely complete daily activities?: Yes Is the patient deaf or have difficulty hearing?: No Does the patient have difficulty seeing, even when wearing glasses/contacts?: No Does the patient have difficulty concentrating, remembering, or making decisions?: No Patient able to express need for assistance with ADLs?: Yes Does the patient have difficulty dressing or bathing?: Yes Independently performs ADLs?: Yes (appropriate for developmental age) Does the patient have difficulty walking or climbing stairs?: Yes Weakness of Legs: Both Weakness of Arms/Hands: Both  Permission Sought/Granted   Permission granted to share information with : Yes, Verbal Permission Granted  Share Information with NAME: Vilma Prader  Relative  [phone]           Emotional Assessment Appearance:: Appears stated age Attitude/Demeanor/Rapport: Engaged Affect (typically observed): Accepting Orientation: : Oriented to Self, Oriented to Place, Oriented to  Time, Oriented to Situation Alcohol / Substance Use: Not Applicable Psych Involvement: No (comment)  Admission diagnosis:  Myelopathy concurrent with and due to spinal stenosis of thoracic region [facility]) [id number], G99.2] Patient Active Problem List   Diagnosis Date Noted   Myelopathy concurrent with and due to spinal stenosis of thoracic region ([place]) 09/30/2022   Autoimmune hepatitis ([place]) 09/21/2022   Hepatic cirrhosis ([place]) 09/21/2022   Aortic stenosis, mild 09/12/2022   Wheezing 09/03/2022   Left lumbar radiculopathy 03/13/2022   Inflammation of  joint of both hands 11/08/2021   Right carpal tunnel syndrome 09/08/2021   Nocturnal leg cramps 03/10/2021   Aortic atherosclerosis ([place]) 03/06/2021   Asthma exacerbation 11/11/2020   B12 deficiency 08/20/2020   Chronic pain of right ankle 08/17/2020   Piriformis syndrome of both sides  10/06/2019   Rib pain on left side 09/21/2019   Irregular heart beat 07/29/2019   Viral labyrinthitis, bilateral 09/14/2018   Left-sided chest wall pain 08/07/2018   Weight loss 07/24/2018   CKD (chronic kidney disease) stage 3, GFR 30-59 ml/min (HCC) 01/20/2018   Carcinoembryonic antigen (CEA) elevation    Colon cancer screening    Generalized abdominal pain 03/04/2017   Hypercalcemia 01/17/2016   Vitamin D deficiency 01/17/2016   Right ankle pain 01/17/2016   Loosening of hardware in spine ([place]) 01/02/2016   Lumbar disc disease 01/17/2015   Urinary incontinence in female 01/17/2015   Cough 07/26/2014   Skin lesion 01/21/2014   Obesity 09/18/2011   Preoperative examination, unspecified 10/15/2010   Encounter for well adult exam with abnormal findings 10/15/2010   Diabetes ([place]) 06/23/2007   OSTEOARTHRITIS, KNEE, LEFT 06/23/2007   Osteoporosis 05/12/2007   Hypothyroidism 04/29/2007   Hyperlipidemia 04/29/2007   Essential hypertension 04/29/2007   Allergic rhinitis 04/29/2007   SPINAL STENOSIS, LUMBAR 04/29/2007   LOW BACK PAIN 04/29/2007   HIATAL HERNIA, HX OF 04/29/2007   PCP:  Biagio Borg, MD Pharmacy:   [id number] - [place],  - [address] AT [address] [place] [address] Phone: [phone] Fax: [phone]     Social Determinants of Health (SDOH) Social History: SDOH Screenings   Food Insecurity: No Food Insecurity (10/01/2022)  Housing: Low Risk  (04/26/2021)  Transportation Needs: No Transportation Needs (04/26/2021)  Alcohol Screen: Low Risk  (04/26/2021)  Depression (PHQ2-9): Low Risk  (09/12/2022)  Financial Resource Strain: Low Risk  (04/26/2021)  Physical Activity: Inactive (04/26/2021)  Social Connections: Moderately Integrated (04/26/2021)  Stress: No Stress Concern Present (04/26/2021)  Tobacco Use: Low Risk  (09/30/2022)   SDOH Interventions:     Readmission Risk  Interventions     No data to display

## 2022-10-02 NOTE — Progress Notes (Signed)
Occupational Therapy Treatment Patient Details Name: Kelsey Keller MRN: [id number] DOB: [birth year] Today's Date: 10/02/2022   History of present illness 82 y.o. female admitted 3/18 for same day Revision of pseudoarthrosis at T10-11 Decompression of T8-9,T9-10, T10-11, Pedicle fixation from T7-L1, Removal of Left L3 screw. After surgery developed atrial fibrillation.  PMHx: hx of type 2 DM, HTN, HLD, GERD, osteoarthritis, scoliosis.   OT comments  Completed education session with pt/daughter regarding back precautions for ADL and functional mobility with use of compensatory techniques, AE and DME. Pt/daughter demonstrated understanding as noted below and can provide necessary level of assistance at DC. Pt has made excellent progress and is motivated to DC home. Continue to recommend HHOT to facilitate return to independent living in her own home.    Recommendations for follow up therapy are one component of a multi-disciplinary discharge planning process, led by the attending physician.  Recommendations may be updated based on patient status, additional functional criteria and insurance authorization.    Follow Up Recommendations  Home health OT     Assistance Recommended at Discharge Intermittent Supervision/Assistance  Patient can return home with the following  A little help with walking and/or transfers;A little help with bathing/dressing/bathroom;Assistance with cooking/housework;Assist for transportation   Equipment Recommendations  None recommended by OT    Recommendations for Other Services      Precautions / Restrictions Precautions Precautions: Back Precaution Booklet Issued: Yes (comment) Required Braces or Orthoses: Spinal Brace Spinal Brace: Thoracolumbosacral orthotic;Applied in sitting position Restrictions Weight Bearing Restrictions: No       Mobility Bed Mobility               General bed mobility comments: reviewed technique    Transfers Overall  transfer level: Modified independent Equipment used: Rolling walker (2 wheels)                     Balance                                           ADL either performed or assessed with clinical judgement   ADL                                       Functional mobility during ADLs: Modified independent;Rolling walker (2 wheels) General ADL Comments: daughter educated on compensatory strategies and use of AE/DME to increase independence with ADL. Pt has a sock aid, long handled sponge, shoe horn and toilet aid at home. Educated on how to use a Secondary school teacher for dressing; educated on shower trasnfers and donning/doffing TLSO. Pt/daughter able to return demonstrate. Educated on hoem safety and strategies to reduce risk of falls    Extremity/Trunk Assessment              Vision       Perception     Praxis      Cognition Arousal/Alertness: Awake/alert Behavior During Therapy: WFL for tasks assessed/performed Overall Cognitive Status: Within Functional Limits for tasks assessed                                          Exercises      Shoulder Instructions  General Comments Issued gait belt    Pertinent Vitals/ Pain       Pain Assessment Pain Assessment: Faces Faces Pain Scale: Hurts a little bit Pain Location: upper mid back Pain Descriptors / Indicators: Discomfort Pain Intervention(s): Limited activity within patient's tolerance  Home Living                                          Prior Functioning/Environment              Frequency  Min 2X/week        Progress Toward Goals  OT Goals(current goals can now be found in the care plan section)  Progress towards OT goals: Progressing toward goals  Acute Rehab OT Goals Patient Stated Goal: to get a shower OT Goal Formulation: With patient Time For Goal Achievement: 10/15/22 Potential to Achieve Goals: Good ADL Goals Pt  Will Perform Lower Body Dressing: with min assist;bed level;with adaptive equipment Pt Will Perform Toileting - Clothing Manipulation and hygiene: with modified independence;with adaptive equipment;sit to/from stand Pt Will Perform Tub/Shower Transfer: with modified independence;shower seat;rolling walker;ambulating Additional ADL Goal #1: Pt and caregiver will be able to verbalize and demonstrate current donning/doffing of back brace and precautions.  Plan Discharge plan remains appropriate    Co-evaluation                 AM-PAC OT "6 Clicks" Daily Activity     Outcome Measure   Help from another person eating meals?: None Help from another person taking care of personal grooming?: A Little Help from another person toileting, which includes using toliet, bedpan, or urinal?: A Little Help from another person bathing (including washing, rinsing, drying)?: A Little Help from another person to put on and taking off regular upper body clothing?: A Little Help from another person to put on and taking off regular lower body clothing?: A Little 6 Click Score: 19    End of Session Equipment Utilized During Treatment: Gait belt;Rolling walker (2 wheels);Back brace  OT Visit Diagnosis: Unsteadiness on feet (R26.81);Other abnormalities of gait and mobility (R26.89);Pain;Muscle weakness (generalized) (M62.81) Pain - part of body:  (back)   Activity Tolerance     Patient Left     Nurse Communication          Time: 0950-1030 OT Time Calculation (min): 40 min  Charges: OT General Charges $OT Visit: 1 Visit OT Treatments $Self Care/Home Management : 38-52 mins  Maurie Boettcher, OT/L   Acute OT Clinical Specialist [place] Pager [phone] Office [phone]   [facility] 10/02/2022, 10:46 AM

## 2022-10-02 NOTE — Progress Notes (Signed)
Patient ID: Kelsey Keller, female   DOB: 08-16-1940, 82 y.o.   MRN: [id number] Vital signs are stable, still in Afib Received 2 units prbcs but no post of hgb See PT OT rehab rec for home health PT OT [place] for anticoagulation as recommended by cardiology Would like to plan discharge for tomorrow unless further cardiology monitoring is required.

## 2022-10-02 NOTE — TOC Benefit Eligibility Note (Signed)
Patient Advocate Encounter  Insurance verification completed.    The patient is currently admitted and upon discharge could be taking Eliquis 5 mg.  The current 30 day co-pay is $47.00.   The patient is currently admitted and upon discharge could be taking Xarelto 20 mg.  The current 30 day co-pay is $47.00.  The patient is insured through [employer] Medicare Part D   Argyle Gustafson, CPHT Pharmacy Patient Advocate Specialist  Pharmacy Patient Advocate Team Direct Number: [phone]  Fax: [phone]       

## 2022-10-02 NOTE — Progress Notes (Addendum)
CARDIOLOGY ROUNDING PROGRESS NOTE  Patient Name: Kelsey Keller Date of Encounter: 10/02/2022  [facility] Cardiologist: Larae Grooms, MD  Patient Profile     82 y.o. female with a hx of T2DM, HTN, HLD, GERD, OA, scoliosis, no prior cardiac history who presented to [facility] on 3/18 for elective thoracic spine surgery for thoracic myelopathy c/b atrial fibrillation for which cardiology was consulted, now post op day 2  Principal Problem:   Myelopathy concurrent with and due to spinal stenosis of thoracic region [facility])  Of note, patient had TTE with on 08/14/22 that showed EF of 60-65%, no regional wall motion abnormalities, grade II diastolic dysfunction, and normal RV systolic function. LA was moderately dilated, and AV with mild stenosis.   Subjective   O/N: Patient with continuous runs of atrial fibrillation, now on day 3. Rates controlled below 100 per review of cardiac telemetry. Patient is asymptomatic- denies chest pain, shortness of breath, palpitations, changes in vision, or presyncopal episodes at rest or ambulation with physical therapy. The chest discomfort she experiences yesterday morning dissipated with pain control. She denies pain from surgical site today. Has been able to tolerate PO intake without nausea or vomiting. Denies new bruises or blood per mouth, urine or rectum.   Dr. Ellene Route present in the room today during this encounter.  ROS:  All other ROS reviewed and negative. Pertinent positives noted in the HPI.     Inpatient Medications  Scheduled Meds:  atenolol  50 mg Oral Daily   dapagliflozin propanediol  5 mg Oral QAC breakfast   docusate sodium  100 mg Oral BID   hydrochlorothiazide  25 mg Oral Daily   insulin aspart  0-20 Units Subcutaneous TID WC   levothyroxine  125 mcg Oral Q0600   metFORMIN  1,000 mg Oral BID WC   pantoprazole  40 mg Oral BID   pioglitazone  30 mg Oral Daily   senna  1 tablet Oral BID   sodium chloride flush  3 mL Intravenous  Q12H   Continuous Infusions:  sodium chloride     lactated ringers 100 mL/hr at 09/30/22 1734   methocarbamol (ROBAXIN) IV     PRN Meds: acetaminophen **OR** acetaminophen, alum & mag hydroxide-simeth, bisacodyl, fluticasone, meclizine, menthol-cetylpyridinium **OR** phenol, methocarbamol **OR** methocarbamol (ROBAXIN) IV, morphine injection, ondansetron **OR** ondansetron (ZOFRAN) IV, oxyCODONE-acetaminophen, polyethylene glycol, polyvinyl alcohol, sodium chloride flush, sodium phosphate   Vital Signs    Vitals:   10/01/22 1607 10/01/22 1850 10/02/22 0509 10/02/22 0721  BP: 118/67 103/71 112/63 (!) 103/58  Pulse: 84 87 96 73  Resp: 19 18 16 17   Temp: 98.1 F (36.7 C) (P) 98.2 F (36.8 C) 98.1 F (36.7 C) 98.3 F (36.8 C)  TempSrc: Oral (P) Oral Oral Oral  SpO2: 93% 95% 96% 93%  Weight:      Height:        Intake/Output Summary (Last 24 hours) at 10/02/2022 1052 Last data filed at 10/01/2022 1851 Gross per 24 hour  Intake 590 ml  Output --  Net 590 ml      09/30/2022    5:57 AM 09/25/2022    8:09 AM 09/18/2022    8:52 AM  Last 3 Weights  Weight (lbs) 208 lb 207 lb 208 lb 3.2 oz  Weight (kg) 94.348 kg 93.895 kg 94.439 kg      Telemetry  Overnight telemetry shows atrial fibrillation, which I personally reviewed.    Physical Exam   General: Well nourished, well developed, in  no acute distress Head: Atraumatic, normal size  Eyes: PEERLA, EOMI  Neck: Supple, no JVD Endocrine: No thryomegaly Cardiac: Irregularly irregular rate; no murmurs, rubs, or gallops Lungs: Clear to auscultation bilaterally, no wheezing, rhonchi or rales  Abd: Soft, nontender, no hepatomegaly  Ext: No edema, pulses 2+ Musculoskeletal: Moving all extremities - patient getting ready to work with PT Skin: Warm and dry, no rashes  - antecubital venipuncture sites with evidence of chronic bruising. Neuro: Alert and oriented to person, place, time, and situation Psych: Normal mood and affect   ECG   The most recent ECG on 3/18 shows atrial fibrillation, which I personally reviewed.   Labs    High Sensitivity Troponin:  No results for input(s): "TROPONINIHS" in the last 720 hours.   Chemistry Recent Labs  Lab 09/30/22 1813 10/01/22 0121  NA 134* 133*  K 5.0 4.9  CL 103 99  CO2 24 23  GLUCOSE 274* 237*  BUN 32* 32*  CREATININE 1.07* 1.16*  CALCIUM 9.0 8.8*  GFRNONAA 53* 48*  ANIONGAP 7 11    Lipids No results for input(s): "CHOL", "TRIG", "HDL", "LABVLDL", "LDLCALC", "CHOLHDL" in the last 168 hours.  Hematology Recent Labs  Lab 09/30/22 1813 10/01/22 0121  WBC 6.5 7.2  RBC 2.90* 2.57*  HGB 7.8* 7.2*  HCT 26.0* 22.7*  MCV 89.7 88.3  MCH 26.9 28.0  MCHC 30.0 31.7  RDW 15.1 15.3  PLT 119* 113*   Thyroid  Recent Labs  Lab 10/01/22 0121  TSH 0.872    BNPNo results for input(s): "BNP", "PROBNP" in the last 168 hours.  DDimer No results for input(s): "DDIMER" in the last 168 hours.   Radiology    DG THORACOLUMBAR SPINE  Result Date: 09/30/2022 CLINICAL DATA:  Thoracic spine decompression with hardware EXAM: THORACOLUMBAR SPINE intraoperative fluoroscopy COMPARISON:  02/26/2022 x-ray FINDINGS: Two fluoroscopic spot images submitted for review demonstrate vertical fixation rods and pedicle screws along the visualized lower thoracic and upper lumbar spine. Imaging was obtained to aid in treatment. Please correlate with real-time fluoroscopy of 73.9 seconds. Cumulative dose 0.67469 mGy meter squared IMPRESSION: Intraoperative fluoroscopy Electronically Signed   By: Jill Side M.D.   On: 09/30/2022 16:00   DG C-Arm 1-60 Min-No Report  Result Date: 09/30/2022 Fluoroscopy was utilized by the requesting physician.  No radiographic interpretation.   DG C-Arm 1-60 Min-No Report  Result Date: 09/30/2022 Fluoroscopy was utilized by the requesting physician.  No radiographic interpretation.   DG C-Arm 1-60 Min-No Report  Result Date: 09/30/2022 Fluoroscopy was  utilized by the requesting physician.  No radiographic interpretation.   DG C-Arm 1-60 Min-No Report  Result Date: 09/30/2022 Fluoroscopy was utilized by the requesting physician.  No radiographic interpretation.   DG C-Arm 1-60 Min-No Report  Result Date: 09/30/2022 Fluoroscopy was utilized by the requesting physician.  No radiographic interpretation.    Cardiac Studies - Summarized   08/14/2022 2D ECHOCARDIOGRAM IMPRESSIONS   1. Left ventricular ejection fraction, by estimation, is 60 to 65%. The left ventricle has normal function. The left ventricle has no regional wall motion abnormalities. Left  ventricular diastolic parameters are consistent with Grade II diastolic dysfunction (pseudonormalization).   2. Right ventricular systolic function is normal. The right ventricular size is normal. There is normal pulmonary artery systolic pressure.   3. Left atrial size was moderately dilated.  4. Right atrial size was mildly dilated.  5. The mitral valve is normal in structure. Trivial mitral valve regurgitation. No evidence of mitral stenosis.  6. The aortic valve is calcified. There is moderate calcification of the aortic valve. There is moderate thickening of the aortic valve. Aortic valve regurgitation is trivial. Mild aortic valve stenosis. Aortic valve mean gradient measures 16.3 mmHg.  7. The inferior vena cava is normal in size with greater than 50%  respiratory variability, suggesting right atrial pressure of 3 mmHg.   Comparison(s): No prior Echocardiogram.  Conclusion(s)/Recommendation(s): Mild aortic stenosis, aortic valve with  significant calcification/restriction of NCC/LCC suggesting valve is  functionally bicuspid.     Assessment & Plan   New onset Atrial fibrillation Likely etiology is post procedural No signs of ischemia on EKG and normal LV EF, moderately dilated LA, trivial MR and aortic sclerosis on 2D echocardiogram. Stable known thyroid disease with medical  management.  -CHA2DS2-VASc Score = 5 for HTN, DM, Age, and gender Given continuous runs of atrial fibrillation, will need anticoagulation Discussed with Dr. Ellene Route at bedside; pending post transfusion Hgb, will start with Eliquis 5 mg BID tonight -HR controlled with home Atenolol 50 mg daily, continue -Continue to monitor on cardiac telemetry  -Continue pain management and electrolyte repletion while hospitalized -From Cardiology standpoint, patient could be discharged home tomorrow  -Patient is to f/u in clinic for atrial fibrillation  Pseudoarthrosis T10 and T11  thoracic myelopathy T8-T9 T9-T10  left L3 radiculopathy secondary to misplaced hardware S/p:  #1 removal of left L3 pedicle screw #2 decompression of T8-9 T9-10 thoracic spinal stenosis.   #3 removal of the T10 pedicle screws  #4 revision of arthrodesis with fixation posteriorly T7-8 T8-9 T9-10 to previous hardware from T10 to the sacrum.Posterior arthrodesis with autograft and allograft and Prody os T7-T11 - Management per primary team - Pain and nausea control s/p surgery with percocet 5-325 mg q6 HR -Per Dr. Ellene Route, plan is to discharge home tomorrow with [facility] PT  HTN Continue on Atenolol 50 mg Continue HCTZ  Microcytic Anemia -status post 2 units of pRBCs on 3/19 -Will follow up Hgb today prior to starting [facility] for atrial fibrillation  HTN -Continue on Hydrochlorothiazide 25 mg  -Continue on Atenolol   Hypothyroidism -Last TSH today: 0.8 -Continue on Synthroid 125 mcg daily  T2DM -Currently on Metformin 1000 BID -Pioglitazone 30 mg daily -Farxiga 5 mg      Romana Juniper, MD [facility] Internal Medicine Program 10/02/2022, 10:52 AM  For questions or updates, please contact [place] Please consult [email or web address] for contact info under   CHA2DS2-VASc Score = 5   This indicates a 7.2% annual risk of stroke. The patient's score is based upon: CHF History: 0 HTN History: 1 Diabetes  History: 1 Stroke History: 0 Vascular Disease History: 0 Age Score: 2 Gender Score: 1       I have examined the patient and reviewed assessment and plan and discussed with patient.  Agree with above as stated.    Rate controlled atrial fibrillation.  Will start anticoagulation with Eliquis.  Hemoglobin improved to 9.2.  Okay to anticoagulate per Dr. Ellene Route.  Hopeful for discharge tomorrow.  She is asymptomatic with her atrial fibrillation.  I suspect we will not pursue normal sinus rhythm unless she developed palpitations or heart failure symptoms.  Larae Grooms

## 2022-10-02 NOTE — Progress Notes (Signed)
Physical Therapy Treatment Patient Details Name: Kelsey Keller MRN: [id number] DOB: 05/21/1941 Today's Date: 10/02/2022   History of Present Illness 82 y.o. female admitted 3/18 for same day Revision of pseudoarthrosis at T10-11 Decompression of T8-9,T9-10, T10-11, Pedicle fixation from T7-L1, Removal of Left L3 screw. After surgery developed atrial fibrillation.  PMHx: hx of type 2 DM, HTN, HLD, GERD, osteoarthritis, scoliosis.    PT Comments    Pt seen for PT tx with pt agreeable. Pt requires assistance to don TLSO with PT educating pt on how to don brace. Pt is able to complete STS from recliner with mod I, ambulate in hallway with RW & supervision, and negotiates steps with B rails with CGA. Pt is motivated to get stronger. Pt left in handoff to OT with daughter present for session.    Recommendations for follow up therapy are one component of a multi-disciplinary discharge planning process, led by the attending physician.  Recommendations may be updated based on patient status, additional functional criteria and insurance authorization.  Follow Up Recommendations  Home health PT     Assistance Recommended at Discharge Set up Supervision/Assistance  Patient can return home with the following A little help with walking and/or transfers;A little help with bathing/dressing/bathroom;Assistance with cooking/housework;Assist for transportation;Help with stairs or ramp for entrance   Equipment Recommendations  None recommended by PT    Recommendations for Other Services       Precautions / Restrictions Precautions Precautions: Back Required Braces or Orthoses: Spinal Brace Spinal Brace: Thoracolumbosacral orthotic;Applied in sitting position Restrictions Weight Bearing Restrictions: No     Mobility  Bed Mobility               General bed mobility comments: not tested, pt received sitting in recliner    Transfers Overall transfer level: Modified independent Equipment  used: Rolling walker (2 wheels) Transfers: Sit to/from Stand Sit to Stand: Modified independent (Device/Increase time)           General transfer comment: pt able to transfer STS from recliner with RW & mod I.    Ambulation/Gait Ambulation/Gait assistance: Supervision Gait Distance (Feet): 200 Feet Assistive device: Rolling walker (2 wheels) Gait Pattern/deviations: Step-through pattern, Decreased stride length, Trunk flexed, Decreased dorsiflexion - right Gait velocity: decreased     General Gait Details: decreased heel strike RLE, slight forward lean on RW with BUE   Stairs Stairs: Yes Stairs assistance: Min guard Stair Management: Two rails Number of Stairs: 10 (6" + 3") General stair comments: Pt able to negotiate steps with CGA with PT educating pt on caregiver positioning in relation to pt, as well as min cuing for compensatory pattern (pt able to ascend steps with step over step, descend with step to leading with LLE).   Wheelchair Mobility    Modified Rankin (Stroke Patients Only)       Balance Overall balance assessment: Needs assistance Sitting-balance support: No upper extremity supported, Feet supported Sitting balance-Leahy Scale: Good     Standing balance support: Bilateral upper extremity supported, Reliant on assistive device for balance Standing balance-Leahy Scale: Good                              Cognition Arousal/Alertness: Awake/alert Behavior During Therapy: WFL for tasks assessed/performed Overall Cognitive Status: Within Functional Limits for tasks assessed  General Comments: Pleasant, motivated to participate & get stronger        Exercises      General Comments General comments (skin integrity, edema, etc.): PT provides assistance for donning TLSO with pt reporting compression feels good, educated pt on how to don brace      Pertinent Vitals/Pain Pain Assessment Pain  Assessment: Faces Faces Pain Scale: Hurts a little bit Pain Location: upper mid back Pain Descriptors / Indicators: Discomfort Pain Intervention(s): Monitored during session    Home Living                          Prior Function            PT Goals (current goals can now be found in the care plan section) Acute Rehab PT Goals Patient Stated Goal: Get well, go home PT Goal Formulation: With patient Time For Goal Achievement: 10/08/22 Potential to Achieve Goals: Good Progress towards PT goals: Progressing toward goals    Frequency    Min 4X/week      PT Plan Current plan remains appropriate    Co-evaluation              AM-PAC PT "6 Clicks" Mobility   Outcome Measure  Help needed turning from your back to your side while in a flat bed without using bedrails?: None Help needed moving from lying on your back to sitting on the side of a flat bed without using bedrails?: A Little Help needed moving to and from a bed to a chair (including a wheelchair)?: A Little Help needed standing up from a chair using your arms (e.g., wheelchair or bedside chair)?: None Help needed to walk in hospital room?: A Little Help needed climbing 3-5 steps with a railing? : A Little 6 Click Score: 20    End of Session Equipment Utilized During Treatment: Back brace Activity Tolerance: Patient tolerated treatment well Patient left:  (in handoff to OT, ambulating into room, daughter present)   PT Visit Diagnosis: Unsteadiness on feet (R26.81);Other abnormalities of gait and mobility (R26.89);Muscle weakness (generalized) (M62.81);Difficulty in walking, not elsewhere classified (R26.2);Pain Pain - part of body:  (back)     Time: [id number] PT Time Calculation (min) (ACUTE ONLY): 12 min  Charges:  $Therapeutic Activity: 8-22 mins                     Lavone Nian, PT, DPT 10/02/22, 10:05 AM   Waunita Schooner 10/02/2022, 10:04 AM

## 2022-10-03 ENCOUNTER — Other Ambulatory Visit (HOSPITAL_COMMUNITY): Payer: Self-pay

## 2022-10-03 DIAGNOSIS — Z7901 Long term (current) use of anticoagulants: Secondary | ICD-10-CM

## 2022-10-03 LAB — BASIC METABOLIC PANEL
Anion gap: 6 (ref 5–15)
BUN: 29 mg/dL — ABNORMAL HIGH (ref 8–23)
CO2: 26 mmol/L (ref 22–32)
Calcium: 9.1 mg/dL (ref 8.9–10.3)
Chloride: 100 mmol/L (ref 98–111)
Creatinine, Ser: 1.11 mg/dL — ABNORMAL HIGH (ref 0.44–1.00)
GFR, Estimated: 51 mL/min — ABNORMAL LOW (ref >60)
Glucose, Bld: 173 mg/dL — ABNORMAL HIGH (ref 70–99)
Potassium: 3.6 mmol/L (ref 3.5–5.1)
Sodium: 132 mmol/L — ABNORMAL LOW (ref 135–145)

## 2022-10-03 LAB — GLUCOSE, CAPILLARY
Glucose-Capillary: 172 mg/dL — ABNORMAL HIGH (ref 70–99)
Glucose-Capillary: 233 mg/dL — ABNORMAL HIGH (ref 70–99)
Glucose-Capillary: 186 mg/dL — ABNORMAL HIGH (ref 70–99)

## 2022-10-03 LAB — CBC
HCT: 28.1 % — ABNORMAL LOW (ref 36.0–46.0)
Hemoglobin: 8.7 g/dL — ABNORMAL LOW (ref 12.0–15.0)
MCH: 27.4 pg (ref 26.0–34.0)
MCHC: 31 g/dL (ref 30.0–36.0)
MCV: 88.6 fL (ref 80.0–100.0)
Platelets: 122 10*3/uL — ABNORMAL LOW (ref 150–400)
RBC: 3.17 MIL/uL — ABNORMAL LOW (ref 3.87–5.11)
RDW: 15.7 % — ABNORMAL HIGH (ref 11.5–15.5)
WBC: 7.4 10*3/uL (ref 4.0–10.5)
nRBC: 0.3 % — ABNORMAL HIGH (ref 0.0–0.2)

## 2022-10-03 MED ORDER — OXYCODONE-ACETAMINOPHEN 5-325 MG PO TABS
1.0000 | ORAL_TABLET | Freq: Four times a day (QID) | ORAL | 0 refills | Status: DC | PRN
  Filled 2022-10-03: qty 50, 7d supply, fill #0
  Filled 2022-10-03: qty 50, 9d supply, fill #0

## 2022-10-03 MED ORDER — APIXABAN 5 MG PO TABS
5.0000 mg | ORAL_TABLET | Freq: Two times a day (BID) | ORAL | 3 refills | Status: DC
  Filled 2022-10-03: qty 60, 30d supply, fill #0

## 2022-10-03 MED ORDER — METHOCARBAMOL 500 MG PO TABS
500.0000 mg | ORAL_TABLET | Freq: Four times a day (QID) | ORAL | 3 refills | Status: DC | PRN
  Filled 2022-10-03: qty 40, 10d supply, fill #0
  Filled 2022-10-28: qty 40, 10d supply, fill #1

## 2022-10-03 NOTE — Discharge Summary (Addendum)
Physician Discharge Summary  Patient ID: Kelsey Keller MRN: [id number] DOB/AGE: 04-03-1941 82 y.o.  Admit date: 09/30/2022 Discharge date: 10/03/2022  Admission Diagnoses: Thoracic myelopathy T8-9, T9-10.  Pseudoarthrosis T10 and T11.  Left L3 radiculopathy secondary to malposition hardware  Discharge Diagnoses: Thoracic myelopathy T8-9, T9-10.  Pseudoarthrosis T10 and T11.  Left radiculopathy L3, secondary to malposition of hardware.  Acute blood loss anemia.  New onset atrial fibrillation. Principal Problem:   Myelopathy concurrent with and due to spinal stenosis of thoracic region [facility])   Discharged Condition: fair  Hospital Course: Patient was admitted to undergo surgical decompression and stabilization at the T7-T10 levels to revise a pseudoarthrosis at the T10-T11 level and decompress the thoracic spine secondary to cord compression at T8-9 T9-10.  Patient also had removal of hardware on the left side at L3 where she had a screw that was extrapedicular.  This was performed but in the recovery area was noted patient had developed atrial fibrillation.  This was a new diagnosis for her.  Postoperative laboratory studies showed that she had a greater than 3 g drop in hemoglobin from her preoperative status from a level of 10 down to a level of 7.2.  She was advised regarding the need for transfusion secondary to her atrial fibrillation and her hematocrit of 22.  She tolerated this well.  She is seen by physical therapy and Occupational Therapy and home health physical and occupational therapies have been advised.  She is being started on Eliquis.  She had a cardiology consultation.  Outpatient follow-up has been arranged.  Consults: cardiology  Significant Diagnostic Studies: cardiac graphics: Atrial fibrillation  Treatments: surgery: See op note  Discharge Exam: Blood pressure 118/67, pulse 99, temperature 98.4 F (36.9 C), temperature source Oral, resp. rate 18, height 5\' 4"  (1.626 m),  weight 94.3 kg, SpO2 98 %. Incision is clean and dry.  Station and gait are intact.  Disposition: Discharge disposition: 01-Home or Self Care       Discharge Instructions     Call MD for:  redness, tenderness, or signs of infection (pain, swelling, redness, odor or green/yellow discharge around incision site)   Complete by: As directed    Call MD for:  severe uncontrolled pain   Complete by: As directed    Call MD for:  temperature >100.4   Complete by: As directed    Diet - low sodium heart healthy   Complete by: As directed    Discharge instructions   Complete by: As directed    Okay to shower. Do not apply salves or appointments to incision. No heavy lifting with the upper extremities greater than 10 pounds. May resume driving when not requiring pain medication and patient feels comfortable with doing so.   Incentive spirometry RT   Complete by: As directed    Increase activity slowly   Complete by: As directed       Allergies as of 10/03/2022   No Known Allergies      Medication List     TAKE these medications    acetaminophen 650 MG CR tablet Commonly known as: TYLENOL Take 1,300 mg by mouth every 8 (eight) hours as needed for pain.   apixaban 5 MG Tabs tablet Commonly known as: Eliquis Take 1 tablet (5 mg total) by mouth 2 (two) times daily.   aspirin EC 81 MG tablet Take 81 mg by mouth at bedtime.   atenolol 50 MG tablet Commonly known as: TENORMIN TAKE 1 TABLET BY MOUTH  EVERY DAY   BENGAY EX Apply 1 Application topically daily as needed (arthritis pain).   blood glucose meter kit and supplies Kit Dispense with patient  Insurance; Use up to four times daily as directed. E11.9   Co-Enzyme Q-10 100 MG Caps Take 100 mg by mouth daily.   cyanocobalamin 1000 MCG tablet Take 1,000 mcg by mouth daily.   dapagliflozin propanediol 5 MG Tabs tablet Commonly known as: Farxiga Take 1 tablet (5 mg total) by mouth daily before breakfast.   fluticasone 50  MCG/ACT nasal spray Commonly known as: FLONASE SPRAY 2 SPRAYS INTO EACH NOSTRIL EVERY DAY What changed: See the new instructions.   hydrochlorothiazide 25 MG tablet Commonly known as: HYDRODIURIL TAKE 1 TABLET BY MOUTH EVERY DAY   Lancets Misc Use to check blood sugars twice a day   levothyroxine 125 MCG tablet Commonly known as: SYNTHROID TAKE 1 TABLET BY MOUTH EVERY DAY BEFORE BREAKFAST   Magnesium 250 MG Tabs Take 250 mg by mouth daily.   meclizine 12.5 MG tablet Commonly known as: ANTIVERT Take 1 tablet (12.5 mg total) by mouth 3 (three) times daily as needed for dizziness.   meloxicam 7.5 MG tablet Commonly known as: MOBIC TAKE 1 TABLET BY MOUTH EVERY DAY AS NEEDED FOR PAIN   metFORMIN 1000 MG tablet Commonly known as: GLUCOPHAGE TAKE 1 TABLET BY MOUTH TWICE A DAY WITH FOOD   methocarbamol 500 MG tablet Commonly known as: ROBAXIN Take 1 tablet (500 mg total) by mouth every 6 (six) hours as needed for muscle spasms.   omeprazole 40 MG capsule Commonly known as: PRILOSEC Take 1 capsule (40 mg total) by mouth daily. What changed: when to take this   [place] 2 w/Device Kit Use as directed up to 4 times per day E11.9   OneTouch Verio test strip Generic drug: glucose blood USE TO CHECK BLOOD SUGARS TWICE A DAY   oxyCODONE-acetaminophen 5-325 MG tablet Commonly known as: PERCOCET/ROXICET Take 1-2 tablets by mouth every 6 (six) hours as needed for moderate pain or severe pain.   pioglitazone 30 MG tablet Commonly known as: ACTOS TAKE 1 TABLET BY MOUTH EVERY DAY   Red Yeast Rice Extract 600 MG Caps Take 600 mg by mouth 2 (two) times daily.   SYSTANE OP Place 1 drop into both eyes daily as needed (dry eyes/allergy season).   tiZANidine 2 MG tablet Commonly known as: ZANAFLEX TAKE 1 TABLET BY MOUTH AT BEDTIME AS NEEDED FOR MUSCLE SPASMS.   traMADol 50 MG tablet Commonly known as: ULTRAM TAKE 1 TABLET BY MOUTH EVERY 8 HOURS AS NEEDED.   Vitamin D3  50 MCG (2000 UT) Tabs Generic drug: Cholecalciferol Take 2,000 Units by mouth daily.        Follow-up Information     Biagio Borg, MD Follow up.   Specialties: Internal Medicine, Radiology Contact information: [place] [address] [phone]         Care, [facility] Follow up.   Specialty: [place] Why: [facility] will provide pt with home health PT and OT services, start of care will be within 48 hours post discharge Contact information: [place] [address] [phone]                 Signed: Earleen Newport 10/03/2022, 4:16 PM

## 2022-10-03 NOTE — Progress Notes (Addendum)
CARDIOLOGY ROUNDING PROGRESS NOTE  Patient Name: Kelsey Keller Date of Encounter: 10/03/2022  [facility] Cardiologist: Larae Grooms, MD  Patient Profile     82 y.o. female with a hx of T2DM, HTN, HLD, GERD, OA, scoliosis, no prior cardiac history who presented to [facility] on 3/18 for elective thoracic spine surgery for thoracic myelopathy c/b atrial fibrillation for which cardiology was consulted, now post op day 3  Principal Problem:   Myelopathy concurrent with and due to spinal stenosis of thoracic region [facility])  Of note, patient had TTE with on 08/14/22 that showed EF of 60-65%, no regional wall motion abnormalities, grade II diastolic dysfunction, and normal RV systolic function. LA was moderately dilated, and AV with mild stenosis.   Subjective   O/N: Patient was started on Eliquis yesterday. Patient feeling well overnight, able to work with rehab without chest pain, palpittions or presyncopal episodes.   ROS:  All other ROS reviewed and negative. Pertinent positives noted in the HPI.     Inpatient Medications  Scheduled Meds:  apixaban  5 mg Oral BID   atenolol  50 mg Oral Daily   dapagliflozin propanediol  5 mg Oral QAC breakfast   docusate sodium  100 mg Oral BID   hydrochlorothiazide  25 mg Oral Daily   insulin aspart  0-20 Units Subcutaneous TID WC   levothyroxine  125 mcg Oral Q0600   metFORMIN  1,000 mg Oral BID WC   pantoprazole  40 mg Oral BID   pioglitazone  30 mg Oral Daily   senna  1 tablet Oral BID   sodium chloride flush  3 mL Intravenous Q12H   Continuous Infusions:  sodium chloride     lactated ringers 100 mL/hr at 09/30/22 1734   methocarbamol (ROBAXIN) IV     PRN Meds: acetaminophen **OR** acetaminophen, alum & mag hydroxide-simeth, bisacodyl, fluticasone, meclizine, menthol-cetylpyridinium **OR** phenol, methocarbamol **OR** methocarbamol (ROBAXIN) IV, morphine injection, ondansetron **OR** ondansetron (ZOFRAN) IV, oxyCODONE-acetaminophen,  polyethylene glycol, polyvinyl alcohol, sodium chloride flush, sodium phosphate   Vital Signs    Vitals:   10/02/22 1307 10/02/22 1930 10/03/22 0502 10/03/22 0843  BP:  118/67 104/71 126/85  Pulse:  91 85 99  Resp:  19 20 18   Temp: 98.3 F (36.8 C) 98.3 F (36.8 C) 98.4 F (36.9 C) 98.5 F (36.9 C)  TempSrc: Oral Oral Oral Oral  SpO2:  92% 96% 98%  Weight:      Height:        Intake/Output Summary (Last 24 hours) at 10/03/2022 0949 Last data filed at 10/02/2022 2045 Gross per 24 hour  Intake --  Output 400 ml  Net -400 ml      09/30/2022    5:57 AM 09/25/2022    8:09 AM 09/18/2022    8:52 AM  Last 3 Weights  Weight (lbs) 208 lb 207 lb 208 lb 3.2 oz  Weight (kg) 94.348 kg 93.895 kg 94.439 kg      Telemetry  Overnight telemetry shows atrial fibrillation with rates <100, which I personally reviewed.    Physical Exam   General: NAD Head: Atraumatic, normal size  Eyes: PEERLA, EOMI  Neck: Supple, no JVD Endocrine: No thryomegaly Cardiac: Irregularly irregular rate, no murmurs, rubs, or gallops Lungs: Clear to auscultation bilaterally, no wheezing, rhonchi or rales  Abd: Soft, nontender, no hepatomegaly  Ext: No edema, pulses 2+ Musculoskeletal:able to ambulate with walker Skin: Warm and dry, no rashes  - antecubital venipuncture sites with evidence of chronic  bruising, unchanged Neuro: Alert and oriented x4 Psych: Pleasant mood and affect   ECG  The most recent ECG on 3/18 shows atrial fibrillation, which I personally reviewed.   Labs    High Sensitivity Troponin:  No results for input(s): "TROPONINIHS" in the last 720 hours.   Chemistry Recent Labs  Lab 10/01/22 0121 10/02/22 0938 10/03/22 0225  NA 133* 132* 132*  K 4.9 3.6 3.6  CL 99 99 100  CO2 23 24 26   GLUCOSE 237* 195* 173*  BUN 32* 29* 29*  CREATININE 1.16* 1.14* 1.11*  CALCIUM 8.8* 9.4 9.1  GFRNONAA 48* 49* 51*  ANIONGAP 11 9 6     Lipids No results for input(s): "CHOL", "TRIG", "HDL",  "LABVLDL", "LDLCALC", "CHOLHDL" in the last 168 hours.  Hematology Recent Labs  Lab 10/01/22 0121 10/02/22 0938 10/03/22 0225  WBC 7.2 8.8 7.4  RBC 2.57* 3.40* 3.17*  HGB 7.2* 9.3* 8.7*  HCT 22.7* 29.6* 28.1*  MCV 88.3 87.1 88.6  MCH 28.0 27.4 27.4  MCHC 31.7 31.4 31.0  RDW 15.3 15.7* 15.7*  PLT 113* 129* 122*   Thyroid  Recent Labs  Lab 10/01/22 0121  TSH 0.872    BNPNo results for input(s): "BNP", "PROBNP" in the last 168 hours.  DDimer No results for input(s): "DDIMER" in the last 168 hours.   Radiology    No results found.  Cardiac Studies - Summarized   08/14/2022 2D ECHOCARDIOGRAM IMPRESSIONS   1. Left ventricular ejection fraction, by estimation, is 60 to 65%. The left ventricle has normal function. The left ventricle has no regional wall motion abnormalities. Left  ventricular diastolic parameters are consistent with Grade II diastolic dysfunction (pseudonormalization).   2. Right ventricular systolic function is normal. The right ventricular size is normal. There is normal pulmonary artery systolic pressure.   3. Left atrial size was moderately dilated.  4. Right atrial size was mildly dilated.  5. The mitral valve is normal in structure. Trivial mitral valve regurgitation. No evidence of mitral stenosis.  6. The aortic valve is calcified. There is moderate calcification of the aortic valve. There is moderate thickening of the aortic valve. Aortic valve regurgitation is trivial. Mild aortic valve stenosis. Aortic valve mean gradient measures 16.3 mmHg.  7. The inferior vena cava is normal in size with greater than 50%  respiratory variability, suggesting right atrial pressure of 3 mmHg.   Comparison(s): No prior Echocardiogram.  Conclusion(s)/Recommendation(s): Mild aortic stenosis, aortic valve with  significant calcification/restriction of NCC/LCC suggesting valve is  functionally bicuspid.   Assessment & Plan   New onset Atrial fibrillation Likely  etiology is post procedural. No signs of ischemia on EKG and normal LV EF, moderately dilated LA, trivial MR and aortic sclerosis on 2D echocardiogram. Stable known thyroid disease with medical management.  -CHA2DS2-VASc Score = 5 for HTN, DM, Age, and gender - Continuous runs of atrial fibrillation; patient remains asymptomatic - Now on Eliquis 5 mg BID - HR controlled with home Atenolol 50 mg daily, continue - Continue Eliquis 5 mg BID  At this time the benefits of taking this medication outweigh risks of bleeding  Provided counseling regarding this medications and signs or symptoms of bleed - Patient is to f/u in clinic for atrial fibrillation - From Cardiology standpoint, patient is able to be discharged today  Pseudoarthrosis T10 and T11  thoracic myelopathy T8-T9 T9-T10  left L3 radiculopathy secondary to misplaced hardware S/p:  #1 removal of left L3 pedicle screw #2 decompression of T8-9  T9-10 thoracic spinal stenosis.   #3 removal of the T10 pedicle screws  #4 revision of arthrodesis with fixation posteriorly T7-8 T8-9 T9-10 to previous hardware from T10 to the sacrum.Posterior arthrodesis with autograft and allograft and Prody os T7-T11 - Management per primary team - Pain and nausea control s/p surgery  - Per Dr. Ellene Route, plan is to discharge home with [facility] PT  HTN Continue on Atenolol 50 mg Continue HCTZ  Microcytic Anemia -status post 2 units of pRBCs on 3/19 -Hgb stable today  Hypothyroidism -Last TSH today: 0.8 -Continue on Synthroid 125 mcg daily  T2DM -Currently on Metformin 1000 BID -Pioglitazone 30 mg daily -Farxiga 5 mg     Romana Juniper, MD [facility] Program 10/03/2022, 9:49 AM For questions or updates, please contact [place] Please consult [email or web address] for contact info under   CHA2DS2-VASc Score = 5   This indicates a 7.2% annual risk of stroke. The patient's score is based upon: CHF History: 0 HTN History:  1 Diabetes History: 1 Stroke History: 0 Vascular Disease History: 0 Age Score: 2 Gender Score: 1  I have examined the patient and reviewed assessment and plan and discussed with patient.  Agree with above as stated.    Rate controlled AFib. Eliquis for stroke prevention.  Will f/u in the office.  Not having much in the way of symptoms regarding the AFib.   Larae Grooms

## 2022-10-03 NOTE — Discharge Instructions (Signed)

## 2022-10-03 NOTE — Progress Notes (Signed)
Mobility Specialist Progress Note   10/03/22 1220  Mobility  Activity Ambulated with assistance in hallway  Level of Assistance Standby assist, set-up cues, supervision of patient - no hands on  Assistive Device Front wheel walker  Distance Ambulated (ft) 480 ft (240 + 240)  Range of Motion/Exercises Active;All extremities  Activity Response Tolerated well   Patient received standing in doorway eager to participate. Ambulated supervision level with steady gait. Took x1 standing rest, more for conversation than actual resting for fatigue. Returned to room without complaint or incident. Was left in recliner chair with all needs met, call bell in reach.   [place] Donelle Baba, BS EXP Mobility Specialist Please contact via SecureChat or Rehab office at [phone]

## 2022-10-03 NOTE — Care Management Important Message (Signed)
Important Message  Patient Details  Name: Kelsey Keller MRN: [id number] Date of Birth: Oct 12, 1940   Medicare Important Message Given:  Yes     Hannah Beat 10/03/2022, 1:50 PM

## 2022-10-03 NOTE — Progress Notes (Signed)
AVS given and explained to patient, awaiting for TOC Meds. MD aware of face to face for [facility] PT, OT.

## 2022-10-04 ENCOUNTER — Telehealth: Payer: Self-pay | Admitting: *Deleted

## 2022-10-04 ENCOUNTER — Encounter: Payer: Self-pay | Admitting: *Deleted

## 2022-10-04 NOTE — Transitions of Care (Post Inpatient/ED Visit) (Signed)
10/04/2022  Name: Kelsey Keller MRN: [id number] DOB: 1940/10/07  Today's TOC FU Call Status: Today's TOC FU Call Status:: Successful TOC FU Call Competed Unsuccessful Call (1st Attempt) Date: 10/04/22  Transition Care Management Follow-up Telephone Call Date of Discharge: 10/03/22 Discharge Facility: [facility]) Type of Discharge: Inpatient Admission Primary Inpatient Discharge Diagnosis:: thoracic spinal stenosis/ myelopathy- neurosurgical provider How have you been since you were released from the hospital?: Better ("I am doing okay, and feel much better after taking a shower this morning.  I am staying with my daughter who is taking great care of me, but I am able to do just about everything to care for myself; not needing a lot of help with anything") Any questions or concerns?: No  Items Reviewed: Did you receive and understand the discharge instructions provided?: Yes (thoroughly reviewed with patient who verbalizes good understanding of same by end of TOC call) Extensive review of post-hospital discharge instructions: patient/ caregiver had many questions and had not reviewed at all prior to [facility] call today-- they were unaware of many of the speficic details/ appointments, etc included on AVS Medications obtained and verified?: Yes (Medications Reviewed) (Full medication review completed; no concerns or discrepancies identified; confirmed patient obtained/ is taking all newly Rx'd medications as instructed; self-manages medications and denies questions/ concerns around medications today) Any new allergies since your discharge?: No Dietary orders reviewed?: Yes Type of Diet Ordered:: "Ha[place] Healthy, Low Salt" Do you have support at home?: Yes People in Home: alone Name of Support/Comfort Primary Source: normally lives alone; staying with daughter post-recent surgery; reports she is essentially independent in self-care activities; currently using walker  Home Care and  Equipment/Supplies: [place] Ordered?: Yes Name of Home Health Agency:: [facility] PT/ OT: confirmed patient/ caregiver have contact information for agency- encouraged them to call agency on 10/07/22 if they have not heard from agency by then Has Agency set up a time to come to your home?: No EMR reviewed for [place] Orders: Orders present/patient has not received call (refer to CM for follow-up) Any new equipment or medical supplies ordered?: No  Functional Questionnaire: Do you need assistance with bathing/showering or dressing?: Yes (minimal assistance required per patient report) Do you need assistance with meal preparation?: Yes (minimal assistance required per patient report) Do you need assistance with eating?: No Do you have difficulty maintaining continence: No Do you need assistance with getting out of bed/getting out of a chair/moving?: Yes (minimal assistance required per patient report) Do you have difficulty managing or taking your medications?: No (minimal assistance required per patient report)  Follow up appointments reviewed: PCP Follow-up appointment confirmed?: Yes (care coordination outreach in real-time with scheduling care guide to successfully schedule hospital follow up PCP appointment 10/08/22) Date of PCP follow-up appointment?: 10/08/22 Follow-up Provider: PCP [facility] Follow-up appointment confirmed?: Yes Date of Specialist follow-up appointment?: 10/15/22 Follow-Up Specialty Provider:: cardiology provider: new onset A-Fib post-op; also has neurosurgery provider follow up scheduled 10/18/22 Do you need transportation to your follow-up appointment?: No Do you understand care options if your condition(s) worsen?: Yes-patient verbalized understanding  SDOH Interventions Today    Flowsheet Row Most Recent Value  SDOH Interventions   Food Insecurity Interventions Intervention Not Indicated  Transportation Interventions Intervention Not  Indicated  [normally drives self,  daughter provides transportation post-recent surgery]      TOC Interventions Today    Flowsheet Row Most Recent Value  TOC Interventions   TOC Interventions Discussed/Reviewed TOC Interventions  Discussed, Arranged PCP follow up within 7 days/Care Guide scheduled  [provided my direct contact information should questions/ concerns/ needs arise post-TOC call, prior to RN CM telephone visit]      Interventions Today    Flowsheet Row Most Recent Value  Chronic Disease   Chronic disease during today's visit Other, Atrial Fibrillation (AFib)  [neurosurgery,  new onset A-Fib post-op]  General Interventions   General Interventions Discussed/Reviewed General Interventions Discussed, Referral to Nurse, Doctor Visits, Communication with  Doctor Visits Discussed/Reviewed Doctor Visits Discussed, PCP, Specialist  PCP/Specialist Visits Compliance with follow-up visit  Communication with RN  Education Interventions   Education Provided Provided Education  Provided Verbal Education On Medication, When to see the doctor, Other  [facility],  basics of A-Fib/ ACT therapy]  Nutrition Interventions   Nutrition Discussed/Reviewed Nutrition Discussed  Pharmacy Interventions   Pharmacy Dicussed/Reviewed Pharmacy Topics Discussed, Medications and their functions  [Full medication review with updating medication list in EHR per patient report,  importance of clarifying ASA instructions post-hospital discharge (pt. currently not taking),  talking points to cover during upcoming provider appts,  safe use of narcotics]  Safety Interventions   Safety Discussed/Reviewed Safety Discussed, Fall Risk  [fall prevention in setting of new ACT therapy]      Oneta Rack, RN, BSN, CCRN Alumnus RN CM Care Coordination/ Transition of [place] Management [phone]: direct office

## 2022-10-04 NOTE — Transitions of Care (Post Inpatient/ED Visit) (Signed)
   10/04/2022  Name: Kelsey Keller MRN: [id number] DOB: [birth year]  Today's TOC FU Call Status: Today's TOC FU Call Status:: Unsuccessul Call (1st Attempt) Unsuccessful Call (1st Attempt) Date: 10/04/22  Attempted to reach the patient regarding the most recent Inpatient visit; left HIPAA compliant voice message requesting call back  Follow Up Plan: Additional outreach attempts will be made to reach the patient to complete the Transitions of Care (Post Inpatient visit) call.   Oneta Rack, RN, BSN, CCRN Alumnus RN CM Care Coordination/ Transition of [place] Management [phone]: direct office

## 2022-10-04 NOTE — Patient Outreach (Addendum)
  Care Coordination   Initial Visit Note   10/04/2022 Name: Kelsey Keller MRN: [id number] DOB: 1940-08-04  Kelsey Keller is a 82 y.o. year old female who sees Biagio Borg, MD for primary care. I spoke with  Loni Beckwith by phone today.  What matters to the patients health and wellness today?  "After we spoke earlier today, I called Dr. Clarice Pole office and discovered that my surgical follow up is actually on 10/16/22, not 10/18/22 like I told you earlier; it is fine with me for you to re-schedule the nurse call to 10/18/22 so I won't be rushed when she calls due to having my surgeon follow up on 10/16/22"  Patient called and left voice message, post-TOC call earlier today-- returned patient's call promptly- RN CM Care Coordinator telephone visit re-scheduled around conflicting appointment with surgical provider on 10/16/22:  RN CM Care Coordination telephone visit now scheduled for 10/18/22   SDOH assessments and interventions completed:  Yes  SDOH assessment completed earlier today during [facility] call  Care Coordination Interventions:  Yes, provided   Interventions Today    Flowsheet Row Most Recent Value  Chronic Disease   Chronic disease during today's visit Atrial Fibrillation (AFib)  General Interventions   General Interventions Discussed/Reviewed General Interventions Discussed  Communication with RN  [re-scheduled RN CM care coordination telephone visit due to conflict with provider appointment]      Follow up plan: Follow up call scheduled for Friday, 10/18/22 at 2:00 pm with RN CM Care Coordinator    Encounter Outcome:  Pt. Visit Completed   Oneta Rack, RN, BSN, CCRN Alumnus RN CM Care Coordination/ Transition of [place] Management [phone]: direct office

## 2022-10-08 ENCOUNTER — Ambulatory Visit (INDEPENDENT_AMBULATORY_CARE_PROVIDER_SITE_OTHER): Payer: Medicare HMO | Admitting: Internal Medicine

## 2022-10-08 ENCOUNTER — Encounter: Payer: Self-pay | Admitting: Internal Medicine

## 2022-10-08 VITALS — BP 132/80 | HR 79 | Temp 97.9°F | Ht 64.0 in | Wt 221.0 lb

## 2022-10-08 DIAGNOSIS — E1165 Type 2 diabetes mellitus with hyperglycemia: Secondary | ICD-10-CM | POA: Diagnosis not present

## 2022-10-08 DIAGNOSIS — M5416 Radiculopathy, lumbar region: Secondary | ICD-10-CM

## 2022-10-08 DIAGNOSIS — N760 Acute vaginitis: Secondary | ICD-10-CM | POA: Diagnosis not present

## 2022-10-08 DIAGNOSIS — D649 Anemia, unspecified: Secondary | ICD-10-CM | POA: Diagnosis not present

## 2022-10-08 DIAGNOSIS — I4891 Unspecified atrial fibrillation: Secondary | ICD-10-CM | POA: Diagnosis not present

## 2022-10-08 DIAGNOSIS — R300 Dysuria: Secondary | ICD-10-CM

## 2022-10-08 LAB — CBC WITH DIFFERENTIAL/PLATELET
Basophils Absolute: 0 10*3/uL (ref 0.0–0.1)
Basophils Relative: 0.4 % (ref 0.0–3.0)
Eosinophils Absolute: 0.2 10*3/uL (ref 0.0–0.7)
Eosinophils Relative: 2.7 % (ref 0.0–5.0)
HCT: 29.7 % — ABNORMAL LOW (ref 36.0–46.0)
Hemoglobin: 9.6 g/dL — ABNORMAL LOW (ref 12.0–15.0)
Lymphocytes Relative: 16 % (ref 12.0–46.0)
Lymphs Abs: 0.9 10*3/uL (ref 0.7–4.0)
MCHC: 32.3 g/dL (ref 30.0–36.0)
MCV: 85.7 fl (ref 78.0–100.0)
Monocytes Absolute: 0.6 10*3/uL (ref 0.1–1.0)
Monocytes Relative: 10.1 % (ref 3.0–12.0)
Neutro Abs: 4 10*3/uL (ref 1.4–7.7)
Neutrophils Relative %: 70.8 % (ref 43.0–77.0)
Platelets: 216 10*3/uL (ref 150.0–400.0)
RBC: 3.47 Mil/uL — ABNORMAL LOW (ref 3.87–5.11)
RDW: 16.5 % — ABNORMAL HIGH (ref 11.5–15.5)
WBC: 5.7 10*3/uL (ref 4.0–10.5)

## 2022-10-08 LAB — URINALYSIS, ROUTINE W REFLEX MICROSCOPIC
Bilirubin Urine: NEGATIVE
Hgb urine dipstick: NEGATIVE
Ketones, ur: NEGATIVE
Leukocytes,Ua: NEGATIVE
Nitrite: NEGATIVE
Specific Gravity, Urine: 1.02 (ref 1.000–1.030)
Total Protein, Urine: NEGATIVE
Urine Glucose: 500 — AB
Urobilinogen, UA: 1 (ref 0.0–1.0)
pH: 5.5 (ref 5.0–8.0)

## 2022-10-08 LAB — IBC PANEL
Iron: 46 ug/dL (ref 42–145)
Saturation Ratios: 13.8 % — ABNORMAL LOW (ref 20.0–50.0)
TIBC: 333.2 ug/dL (ref 250.0–450.0)
Transferrin: 238 mg/dL (ref 212.0–360.0)

## 2022-10-08 LAB — FERRITIN: Ferritin: 65.9 ng/mL (ref 10.0–291.0)

## 2022-10-08 LAB — BASIC METABOLIC PANEL
BUN: 26 mg/dL — ABNORMAL HIGH (ref 6–23)
CO2: 31 mEq/L (ref 19–32)
Calcium: 9.7 mg/dL (ref 8.4–10.5)
Chloride: 98 mEq/L (ref 96–112)
Creatinine, Ser: 0.97 mg/dL (ref 0.40–1.20)
GFR: 55.41 mL/min — ABNORMAL LOW (ref >60.00)
Glucose, Bld: 221 mg/dL — ABNORMAL HIGH (ref 70–99)
Potassium: 4.3 mEq/L (ref 3.5–5.1)
Sodium: 136 mEq/L (ref 135–145)

## 2022-10-08 MED ORDER — FLUCONAZOLE 150 MG PO TABS: ORAL_TABLET | ORAL | 1 refills | Status: DC

## 2022-10-08 NOTE — Patient Instructions (Signed)
Please take all new medication as prescribed -- the diflucan as needed  Please continue all other medications as before, including to restart the [place]  Please have the pharmacy call with any other refills you may need.  Please continue your efforts at being more active, low cholesterol diabetic diet, and weight control.  Please keep your appointments with your specialists as you may have planned  Please go to the LAB at the blood drawing area for the tests to be done  You will be contacted by phone if any changes need to be made immediately.  Otherwise, you will receive a letter about your results with an explanation, but please check with MyChart first.  Please remember to sign up for MyChart if you have not done so, as this will be important to you in the future with finding out test results, communicating by private email, and scheduling acute appointments online when needed.  Please make an Appointment to return in 3 months, or sooner if needed

## 2022-10-08 NOTE — Progress Notes (Signed)
Patient ID: Kelsey Keller, female   DOB: 08-Dec-1940, 82 y.o.   MRN: [id number]        Chief Complaint: follow up s/p lumbar surgury, new afib post op, tunge soreness and vaginitis, DM, dysuria       HPI:  Kelsey Keller is a 82 y.o. female here now sp lumbar surgury pe Dr Ellene Route apr 3 doing well with percocet prn pain and much improved LLE weakness, to start PT soon.  Post op course complicated by new afib, now on eliquis, denies any new overt bleeding and tolerating well. Pt is s/p 2u PRBC after Hgb 7.2 post op.   P to start mar 28 at home.  No falls post op.  Sugars have been 30-50 pt higher post op despite appetite not quite back to usual.  Pt has not yet restarted farixga.  After recent antibx pt now with tongue soreness and vaginitis symptoms.  Also incidentally with mild dysuria x 3 days but Denies urinary symptoms such as frequency, urgency, flank pain, hematuria or n/v, fever, chills.       Wt Readings from Last 3 Encounters:  10/08/22 221 lb (100.2 kg)  09/30/22 208 lb (94.3 kg)  09/25/22 207 lb (93.9 kg)   BP Readings from Last 3 Encounters:  10/08/22 132/80  10/03/22 118/67  09/25/22 138/69         Past Medical History:  Diagnosis Date   ALLERGIC RHINITIS 04/29/2007   Allergy    Basal cell carcinoma 03/24/2018   sclerosis- upper bridge of nose ([facility])   BCC (basal cell carcinoma) 10/18/2019   right supra orbital region- Cx3 [id number]   Cataract    both eyes   DIABETES MELLITUS, TYPE II 06/23/2007   type 2   Elevated liver enzymes 03/2017   Elevated tumor markers    GERD (gastroesophageal reflux disease)    Heart murmur    per patient   HIATAL HERNIA, HX OF 04/29/2007   History of hiatal hernia    HYPERLIPIDEMIA 04/29/2007   diet controlled on CoQ10   HYPERTENSION 04/29/2007   HYPOKALEMIA 05/02/2009   HYPOTHYROIDISM 04/29/2007   LOW BACK PAIN 04/29/2007   Neuromuscular disorder ([place])    diabetic neuropathy in both legs   OSTEOARTHRITIS, KNEE, LEFT 06/23/2007    OSTEOPOROSIS NOS 05/12/2007   SPINAL STENOSIS, LUMBAR 04/29/2007   Past Surgical History:  Procedure Laterality Date   BACK SURGERY N/A 2014 x 2   T1 TO S 1    CATARACT EXTRACTION W/ INTRAOCULAR LENS  IMPLANT, BILATERAL Bilateral 05/2011   doing well   COLONOSCOPY WITH PROPOFOL N/A 04/10/2017   Procedure: COLONOSCOPY WITH PROPOFOL;  Surgeon: [name], MD;  Location: [facility];  Service: Endoscopy;  Laterality: N/A;   COLONSCOPY  2011   JOINT REPLACEMENT     TOTAL KNEE ARTHROPLASTY  2006   right   TOTAL KNEE ARTHROPLASTY Left 2012    reports that she has never smoked. She has never used smokeless tobacco. She reports current alcohol use. She reports that she does not use drugs. family history includes Arthritis in her brother and mother; COPD in her brother; Colon polyps in an other family member; Coronary artery disease in her father and another family member; Diabetes in an other family member; Hyperlipidemia in her brother; Hypertension in her mother; Stroke in her brother. No Known Allergies Current Outpatient Medications on File Prior to Visit  Medication Sig Dispense Refill   acetaminophen (TYLENOL) 650 MG CR tablet Take  1,300 mg by mouth every 8 (eight) hours as needed for pain.     apixaban (ELIQUIS) 5 MG TABS tablet Take 1 tablet (5 mg total) by mouth 2 (two) times daily. 60 tablet 3   aspirin 81 MG EC tablet Take 81 mg by mouth at bedtime. (Patient not taking: Reported on 10/04/2022)     atenolol (TENORMIN) 50 MG tablet TAKE 1 TABLET BY MOUTH EVERY DAY 90 tablet 1   blood glucose meter kit and supplies KIT Dispense with patient  Insurance; Use up to four times daily as directed. E11.9 1 each 0   Blood Glucose Monitoring Suppl (ONE TOUCH ULTRA 2) w/Device KIT Use as directed up to 4 times per day E11.9 1 each 0   Cholecalciferol (VITAMIN D3) 50 MCG (2000 UT) TABS Take 2,000 Units by mouth daily.     Co-Enzyme Q-10 100 MG CAPS Take 100 mg by mouth daily.      cyanocobalamin 1000 MCG tablet Take 1,000 mcg by mouth daily.     dapagliflozin propanediol (FARXIGA) 5 MG TABS tablet Take 1 tablet (5 mg total) by mouth daily before breakfast. 90 tablet 3   fluticasone (FLONASE) 50 MCG/ACT nasal spray SPRAY 2 SPRAYS INTO EACH NOSTRIL EVERY DAY (Patient taking differently: Place 2 sprays into both nostrils daily as needed for allergies.) 48 mL 3   glucose blood (ONETOUCH VERIO) test strip USE TO CHECK BLOOD SUGARS TWICE A DAY 200 strip 0   hydrochlorothiazide (HYDRODIURIL) 25 MG tablet TAKE 1 TABLET BY MOUTH EVERY DAY 90 tablet 3   Lancets MISC Use to check blood sugars twice a day 200 each 0   levothyroxine (SYNTHROID) 125 MCG tablet TAKE 1 TABLET BY MOUTH EVERY DAY BEFORE BREAKFAST 90 tablet 1   Magnesium 250 MG TABS Take 250 mg by mouth daily. (Patient not taking: Reported on 10/04/2022)     meclizine (ANTIVERT) 12.5 MG tablet Take 1 tablet (12.5 mg total) by mouth 3 (three) times daily as needed for dizziness. 90 tablet 1   meloxicam (MOBIC) 7.5 MG tablet TAKE 1 TABLET BY MOUTH EVERY DAY AS NEEDED FOR PAIN 90 tablet 1   Menthol, Topical Analgesic, (BENGAY EX) Apply 1 Application topically daily as needed (arthritis pain).     metFORMIN (GLUCOPHAGE) 1000 MG tablet TAKE 1 TABLET BY MOUTH TWICE A DAY WITH FOOD 180 tablet 1   methocarbamol (ROBAXIN) 500 MG tablet Take 1 tablet (500 mg total) by mouth every 6 (six) hours as needed for muscle spasms. 40 tablet 3   omeprazole (PRILOSEC) 40 MG capsule Take 1 capsule (40 mg total) by mouth daily. (Patient taking differently: Take 40 mg by mouth at bedtime.) 90 capsule 3   oxyCODONE-acetaminophen (PERCOCET/ROXICET) 5-325 MG tablet Take 1-2 tablets by mouth every 6 (six) hours as needed for moderate pain or severe pain. 50 tablet 0   pioglitazone (ACTOS) 30 MG tablet TAKE 1 TABLET BY MOUTH EVERY DAY 90 tablet 3   Polyethyl Glycol-Propyl Glycol (SYSTANE OP) Place 1 drop into both eyes daily as needed (dry eyes/allergy  season).     Red Yeast Rice Extract 600 MG CAPS Take 600 mg by mouth 2 (two) times daily.     tiZANidine (ZANAFLEX) 2 MG tablet TAKE 1 TABLET BY MOUTH AT BEDTIME AS NEEDED FOR MUSCLE SPASMS. 90 tablet 1   traMADol (ULTRAM) 50 MG tablet TAKE 1 TABLET BY MOUTH EVERY 8 HOURS AS NEEDED. 90 tablet 5   No current facility-administered medications on file prior to  visit.        ROS:  All others reviewed and negative.  Objective        PE:  BP 132/80   Pulse 79   Temp 97.9 F (36.6 C) (Oral)   Ht 5\' 4"  (1.626 m)   Wt 221 lb (100.2 kg)   SpO2 92%   BMI 37.93 kg/m                 Constitutional: Pt appears in NAD               HENT: Head: NCAT.                Right Ear: External ear normal.                 Left Ear: External ear normal.                Eyes: . Pupils are equal, round, and reactive to light. Conjunctivae and EOM are normal               Nose: without d/c or deformity               Neck: Neck supple. Gross normal ROM               Cardiovascular: Normal rate and regular rhythm.                 Pulmonary/Chest: Effort normal and breath sounds without rales or wheezing.                Abd:  Soft, NT, ND, + BS, no organomegaly               Neurological: Pt is alert. At baseline orientation, motor grossly intact               Skin: Skin is warm. No rashes, no other new lesions, LE edema - none               Psychiatric: Pt behavior is normal without agitation   Micro: none  Cardiac tracings I have personally interpreted today:  none  Pertinent Radiological findings (summarize): none   Lab Results  Component Value Date   WBC 5.7 10/08/2022   HGB 9.6 (L) 10/08/2022   HCT 29.7 (L) 10/08/2022   PLT 216.0 10/08/2022   GLUCOSE 221 (H) 10/08/2022   CHOL 141 09/09/2022   TRIG 91.0 09/09/2022   HDL 57.70 09/09/2022   LDLDIRECT 107.5 07/21/2013   LDLCALC 65 09/09/2022   ALT 21 09/09/2022   AST 31 09/09/2022   NA 136 10/08/2022   K 4.3 10/08/2022   CL 98 10/08/2022    CREATININE 0.97 10/08/2022   BUN 26 (H) 10/08/2022   CO2 31 10/08/2022   TSH 0.872 10/01/2022   INR 1.0 09/18/2021   HGBA1C 8.0 (H) 09/09/2022   MICROALBUR 13.8 (H) 09/09/2022   Assessment/Plan:  Kelsey Keller is a 82 y.o. White or Caucasian [1] female with  has a past medical history of ALLERGIC RHINITIS (04/29/2007), Allergy, Basal cell carcinoma (03/24/2018), BCC (basal cell carcinoma) (10/18/2019), Cataract, DIABETES MELLITUS, TYPE II (06/23/2007), Elevated liver enzymes (03/2017), Elevated tumor markers, GERD (gastroesophageal reflux disease), Heart murmur, HIATAL HERNIA, HX OF (04/29/2007), History of hiatal hernia, HYPERLIPIDEMIA (04/29/2007), HYPERTENSION (04/29/2007), HYPOKALEMIA (05/02/2009), HYPOTHYROIDISM (04/29/2007), LOW BACK PAIN (04/29/2007), Neuromuscular disorder ([place]), OSTEOARTHRITIS, KNEE, LEFT (06/23/2007), OSTEOPOROSIS NOS (05/12/2007), and SPINAL STENOSIS, LUMBAR (04/29/2007).  Left lumbar radiculopathy Now improved s/p recent lumbar  surgury, cont percocet prn, cont PT start mar 28  Atrial fibrillation (HCC) Stable volume and rate, for cont'd eliquis  Anemia S/p recent 2 u prbc, now on eliquis, no overt bleeding - for f/u cbc today  Dysuria ? Clinical signfiicance, pt s/p recent antibx, for f/u urine and culture  Diabetes Now with mild increased sugars after farxiga held at recent d/c - ok to restart farxiga  Vaginitis For diflucan asd, also for mild thrush and tongue discomfort  Followup: Return in about 3 months (around 01/08/2023).  Cathlean Cower, MD 10/11/2022 4:32 PM [facility]

## 2022-10-09 LAB — URINE CULTURE

## 2022-10-10 DIAGNOSIS — I4891 Unspecified atrial fibrillation: Secondary | ICD-10-CM | POA: Diagnosis not present

## 2022-10-10 DIAGNOSIS — Z7984 Long term (current) use of oral hypoglycemic drugs: Secondary | ICD-10-CM | POA: Diagnosis not present

## 2022-10-10 DIAGNOSIS — Z7901 Long term (current) use of anticoagulants: Secondary | ICD-10-CM | POA: Diagnosis not present

## 2022-10-10 DIAGNOSIS — D62 Acute posthemorrhagic anemia: Secondary | ICD-10-CM | POA: Diagnosis not present

## 2022-10-10 DIAGNOSIS — E119 Type 2 diabetes mellitus without complications: Secondary | ICD-10-CM | POA: Diagnosis not present

## 2022-10-10 DIAGNOSIS — I10 Essential (primary) hypertension: Secondary | ICD-10-CM | POA: Diagnosis not present

## 2022-10-10 DIAGNOSIS — Z7982 Long term (current) use of aspirin: Secondary | ICD-10-CM | POA: Diagnosis not present

## 2022-10-10 DIAGNOSIS — Z9181 History of falling: Secondary | ICD-10-CM | POA: Diagnosis not present

## 2022-10-10 DIAGNOSIS — Z4789 Encounter for other orthopedic aftercare: Secondary | ICD-10-CM | POA: Diagnosis not present

## 2022-10-11 ENCOUNTER — Encounter: Payer: Self-pay | Admitting: Internal Medicine

## 2022-10-11 DIAGNOSIS — I4891 Unspecified atrial fibrillation: Secondary | ICD-10-CM | POA: Insufficient documentation

## 2022-10-11 DIAGNOSIS — N760 Acute vaginitis: Secondary | ICD-10-CM | POA: Insufficient documentation

## 2022-10-11 DIAGNOSIS — R300 Dysuria: Secondary | ICD-10-CM | POA: Insufficient documentation

## 2022-10-11 DIAGNOSIS — D649 Anemia, unspecified: Secondary | ICD-10-CM | POA: Insufficient documentation

## 2022-10-11 NOTE — Assessment & Plan Note (Signed)
For diflucan asd, also for mild thrush and tongue discomfort

## 2022-10-11 NOTE — Assessment & Plan Note (Signed)
?   Clinical signfiicance, pt s/p recent antibx, for f/u urine and culture

## 2022-10-11 NOTE — Assessment & Plan Note (Signed)
S/p recent 2 u prbc, now on eliquis, no overt bleeding - for f/u cbc today

## 2022-10-11 NOTE — Assessment & Plan Note (Addendum)
Now improved s/p recent lumbar surgury, cont percocet prn, cont PT start mar 28

## 2022-10-11 NOTE — Assessment & Plan Note (Signed)
Stable volume and rate, for cont'd eliquis

## 2022-10-11 NOTE — Assessment & Plan Note (Signed)
Now with mild increased sugars after farxiga held at recent d/c - ok to restart farxiga

## 2022-10-15 ENCOUNTER — Ambulatory Visit: Payer: Medicare HMO | Attending: Physician Assistant | Admitting: Physician Assistant

## 2022-10-15 ENCOUNTER — Encounter: Payer: Self-pay | Admitting: Physician Assistant

## 2022-10-15 VITALS — BP 136/62 | HR 83 | Ht 66.0 in | Wt 209.4 lb

## 2022-10-15 DIAGNOSIS — E039 Hypothyroidism, unspecified: Secondary | ICD-10-CM | POA: Diagnosis not present

## 2022-10-15 DIAGNOSIS — I480 Paroxysmal atrial fibrillation: Secondary | ICD-10-CM

## 2022-10-15 DIAGNOSIS — E1169 Type 2 diabetes mellitus with other specified complication: Secondary | ICD-10-CM

## 2022-10-15 DIAGNOSIS — I10 Essential (primary) hypertension: Secondary | ICD-10-CM | POA: Diagnosis not present

## 2022-10-15 DIAGNOSIS — D509 Iron deficiency anemia, unspecified: Secondary | ICD-10-CM

## 2022-10-15 MED ORDER — FUROSEMIDE 20 MG PO TABS: 20.0000 mg | ORAL_TABLET | Freq: Every day | ORAL | 0 refills | Status: DC | PRN

## 2022-10-15 NOTE — Patient Instructions (Addendum)
Medication Instructions:   FOR 3 DAYS ONLY : TAKE FUROSEMIDE 20 MG ONCE A DAY   THEN RESUME TAKING : AS NEEDED   *If you need a refill on your cardiac medications before your next appointment, please call your pharmacy*   Lab Work: NONE ORDERED  TODAY   If you have labs (blood work) drawn today and your tests are completely normal, you will receive your results only by: [place] (if you have MyChart) OR A paper copy in the mail If you have any lab test that is abnormal or we need to change your treatment, we will call you to review the results.   Testing/Procedures: NONE ORDERED  TODAY   Follow-Up: At [facility], you and your health needs are our priority.  As part of our continuing mission to provide you with exceptional heart care, we have created designated Provider Care Teams.  These Care Teams include your primary Cardiologist (physician) and Advanced Practice Providers (APPs -  Physician Assistants and Nurse Practitioners) who all work together to provide you with the care you need, when you need it.  We recommend signing up for the patient portal called "MyChart".  Sign up information is provided on this After Visit Summary.  MyChart is used to connect with patients for Virtual Visits (Telemedicine).  Patients are able to view lab/test results, encounter notes, upcoming appointments, etc.  Non-urgent messages can be sent to your provider as well.   To learn more about what you can do with MyChart, go to [email or web address].    Your next appointment:   3-6 month(s)  Provider:   Larae Grooms, MD     Other Instructions  Electrical Cardioversion Electrical cardioversion is the delivery of a jolt of electricity to restore a normal rhythm to the heart. A rhythm that is too fast or is not regular (arrhythmia) keeps the heart from pumping blood well. There is also another type of cardioversion called a chemical (pharmacologic) cardioversion. This is when  your health care provider gives you one or more medicines to bring back your regular heart rhythm. Electrical cardioversion is done as a scheduled procedure for arrhythmiasthat are not life-threatening. Electrical cardioversion may also be done in an emergency for sudden life-threatening arrhythmias. Tell a health care provider about: Any allergies you have. All medicines you are taking, including vitamins, herbs, eye drops, creams, and over-the-counter medicines. Any problems you or family members have had with sedatives or anesthesia. Any bleeding problems you have. Any surgeries you have had, including a pacemaker, defibrillator, or other implanted device. Any medical conditions you have. Whether you are pregnant or may be pregnant. What are the risks? Your provider will talk with you about risks. These include: Allergic reactions to medicines. Irritation to the skin on your chest or back where the sticky pads (electrodes) or paddles were put during electrical cardioversion. A blood clot that breaks free and travels to other parts of your body, such as your brain. Return of a worse abnormal heart rhythm that will need to be treated with medicines, a pacemaker, or an implantable cardioverter defibrillator (ICD). What happens before the procedure? Medicines Your provider may give you: Blood-thinning medicines (anticoagulants) so your blood does not clot as easily. If your provider gives you this medicine, you may need to take it for 4 weeks before the procedure. Medicines to help stabilize your heart rate and rhythm. Ask your provider about: Changing or stopping your regular medicines. These include any diabetes medicines or blood thinners  you take. Taking medicines such as aspirin and ibuprofen. These medicines can thin your blood. Do not take them unless your provider tells you to. Taking over-the-counter medicines, vitamins, herbs, and supplements. General instructions Follow  instructions from your provider about what you may eat and drink. Do not put any lotions, powders, or ointments on your chest and back for 24 hours before the procedure. They can cause problems with the electrodes or paddles used to deliver electricity to your heart. Do not wear jewelry as this can interfere with delivering electricity to your heart. If you will be going home right after the procedure, plan to have a responsible adult: Take you home from the hospital or clinic. You will not be allowed to drive. Care for you for the time you are told. Tests You may have an exam or testing. This may include: Blood labs. A transesophageal echocardiogram (TEE). What happens during the procedure?     An IV will be inserted into one of your veins. You will be given a sedative. This helps you relax. Electrodes or metal paddles will be placed on your chest. They may be placed in one of these ways: One placed on your right chest, the other on the left ribs. One placed on your chest and the other on your back. An electrical shock will be delivered. The shock briefly stops (resets) your heart rhythm. Your provider will check to see if your heart rhythm is now normal. Some people need only one shock. Some need more to restore a normal heart rhythm. The procedure may vary among providers and hospitals. What happens after the procedure? Your blood pressure, heart rate, breathing rate, and blood oxygen level will be monitored until you leave the hospital or clinic. Your heart rhythm will be watched to make sure it does not change. This information is not intended to replace advice given to you by your health care provider. Make sure you discuss any questions you have with your health care provider. Document Revised: 02/21/2022 Document Reviewed: 02/21/2022 Elsevier Patient Education  [place].

## 2022-10-15 NOTE — Progress Notes (Signed)
Office Visit    Patient Name: Kelsey Keller Date of Encounter: 10/15/2022  PCP:  Biagio Borg, MD   [place]  Cardiologist:  Larae Grooms, MD  Advanced Practice Provider:  No care team member to display Electrophysiologist:  None    HPI    Kelsey Keller is a 82 y.o. female with type 2 diabetes mellitus, hypertension, hyperlipidemia, GERD, OA, scoliosis, no prior cardiac history presents today for follow-up appointment.  Cardiology was consulted on day 3 after elective thoracic spine surgery for thoracic myopathy for chief complaint of atrial fibrillation.  The patient had TTE on 08/14/2022 that showed EF of 60 to 65%, no regional wall motion abnormalities, grade 2 diastolic dysfunction, normal RV systolic function.  LA was moderately dilated and AV with mild stenosis.  Dr. Irish Lack saw the patient 10/03/2022 and at that time there were no ischemic signs on EKG and the patient had a normal LVEF, moderately dilated LA, trivial MR, and aortic sclerosis on echocardiogram.  Stable known thyroid disease with medical management.  CHA2DS2-VASc score of 5.  She was having continuous runs of atrial fibrillation but the patient was asymptomatic.  She was on Eliquis 5 mg twice daily and her heart rate was well-controlled on atenolol 50 mg daily.  The patient was referred to the atrial fibrillation clinic and was stable for discharge at this time.  Today, she tells me that she recently had back surgery March 18 and is wearing a back brace today.  She is in normal sinus rhythm thankfully.  We discussed briefly DCCV and provided her with some literature on the procedure to look through.  We increased her Lasix to 20 mg for 3 days and then as needed due to some lower extremity swelling.  I encouraged her to continue to move is much as possible postsurgery to prevent a blood clot.  She also was encouraged to wear lower extremity compression although, will need help getting  it on.  Reports no shortness of breath nor dyspnea on exertion. Reports no chest pain, pressure, or tightness. No edema, orthopnea, PND. Reports no palpitations.   Past Medical History    Past Medical History:  Diagnosis Date   ALLERGIC RHINITIS 04/29/2007   Allergy    Basal cell carcinoma 03/24/2018   sclerosis- upper bridge of nose ([facility])   BCC (basal cell carcinoma) 10/18/2019   right supra orbital region- Cx3 50fu   Cataract    both eyes   DIABETES MELLITUS, TYPE II 06/23/2007   type 2   Elevated liver enzymes 03/2017   Elevated tumor markers    GERD (gastroesophageal reflux disease)    Heart murmur    per patient   HIATAL HERNIA, HX OF 04/29/2007   History of hiatal hernia    HYPERLIPIDEMIA 04/29/2007   diet controlled on CoQ10   HYPERTENSION 04/29/2007   HYPOKALEMIA 05/02/2009   HYPOTHYROIDISM 04/29/2007   LOW BACK PAIN 04/29/2007   Neuromuscular disorder    diabetic neuropathy in both legs   OSTEOARTHRITIS, KNEE, LEFT 06/23/2007   OSTEOPOROSIS NOS 05/12/2007   SPINAL STENOSIS, LUMBAR 04/29/2007   Past Surgical History:  Procedure Laterality Date   BACK SURGERY N/A 2014 x 2   T1 TO S 1    CATARACT EXTRACTION W/ INTRAOCULAR LENS  IMPLANT, BILATERAL Bilateral 05/2011   doing well   COLONOSCOPY WITH PROPOFOL N/A 04/10/2017   Procedure: COLONOSCOPY WITH PROPOFOL;  Surgeon: [name], MD;  Location: [facility];  Service: Endoscopy;  Laterality: N/A;   COLONSCOPY  2011   JOINT REPLACEMENT     TOTAL KNEE ARTHROPLASTY  2006   right   TOTAL KNEE ARTHROPLASTY Left 2012    Allergies  No Known Allergies   EKGs/Labs/Other Studies Reviewed:   The following studies were reviewed today:   08/14/2022 2D ECHOCARDIOGRAM IMPRESSIONS   1. Left ventricular ejection fraction, by estimation, is 60 to 65%. The left ventricle has normal function. The left ventricle has no regional wall motion abnormalities. Left  ventricular diastolic parameters are consistent with  Grade II diastolic dysfunction (pseudonormalization).   2. Right ventricular systolic function is normal. The right ventricular size is normal. There is normal pulmonary artery systolic pressure.   3. Left atrial size was moderately dilated.  4. Right atrial size was mildly dilated.  5. The mitral valve is normal in structure. Trivial mitral valve regurgitation. No evidence of mitral stenosis.  6. The aortic valve is calcified. There is moderate calcification of the aortic valve. There is moderate thickening of the aortic valve. Aortic valve regurgitation is trivial. Mild aortic valve stenosis. Aortic valve mean gradient measures 16.3 mmHg.  7. The inferior vena cava is normal in size with greater than 50%  respiratory variability, suggesting right atrial pressure of 3 mmHg.   Comparison(s): No prior Echocardiogram.  Conclusion(s)/Recommendation(s): Mild aortic stenosis, aortic valve with  significant calcification/restriction of NCC/LCC suggesting valve is  functionally bicuspid.     EKG:  EKG is not ordered today.  Recent Labs: 09/09/2022: ALT 21 10/01/2022: TSH 0.872 10/08/2022: BUN 26; Creatinine, Ser 0.97; Hemoglobin 9.6; Platelets 216.0; Potassium 4.3; Sodium 136  Recent Lipid Panel    Component Value Date/Time   CHOL 141 09/09/2022 1007   TRIG 91.0 09/09/2022 1007   HDL 57.70 09/09/2022 1007   CHOLHDL 2 09/09/2022 1007   VLDL 18.2 09/09/2022 1007   LDLCALC 65 09/09/2022 1007   LDLCALC 75 01/26/2020 1141   LDLDIRECT 107.5 07/21/2013 1226    Risk Assessment/Calculations:   CHA2DS2-VASc Score = 5   This indicates a 7.2% annual risk of stroke. The patient's score is based upon: CHF History: 0 HTN History: 1 Diabetes History: 1 Stroke History: 0 Vascular Disease History: 0 Age Score: 2 Gender Score: 1   Home Medications   Current Meds  Medication Sig   acetaminophen (TYLENOL) 650 MG CR tablet Take 1,300 mg by mouth every 8 (eight) hours as needed for pain.    apixaban (ELIQUIS) 5 MG TABS tablet Take 1 tablet (5 mg total) by mouth 2 (two) times daily.   atenolol (TENORMIN) 50 MG tablet TAKE 1 TABLET BY MOUTH EVERY DAY   blood glucose meter kit and supplies KIT Dispense with patient  Insurance; Use up to four times daily as directed. E11.9   Blood Glucose Monitoring Suppl (ONE TOUCH ULTRA 2) w/Device KIT Use as directed up to 4 times per day E11.9   dapagliflozin propanediol (FARXIGA) 5 MG TABS tablet Take 1 tablet (5 mg total) by mouth daily before breakfast.   fluticasone (FLONASE) 50 MCG/ACT nasal spray SPRAY 2 SPRAYS INTO EACH NOSTRIL EVERY DAY (Patient taking differently: Place 2 sprays into both nostrils daily as needed for allergies.)   furosemide (LASIX) 20 MG tablet Take 1 tablet (20 mg total) by mouth daily as needed.   glucose blood (ONETOUCH VERIO) test strip USE TO CHECK BLOOD SUGARS TWICE A DAY   hydrochlorothiazide (HYDRODIURIL) 25 MG tablet TAKE 1 TABLET BY MOUTH EVERY DAY  Lancets MISC Use to check blood sugars twice a day   levothyroxine (SYNTHROID) 125 MCG tablet TAKE 1 TABLET BY MOUTH EVERY DAY BEFORE BREAKFAST   meclizine (ANTIVERT) 12.5 MG tablet Take 1 tablet (12.5 mg total) by mouth 3 (three) times daily as needed for dizziness.   meloxicam (MOBIC) 7.5 MG tablet TAKE 1 TABLET BY MOUTH EVERY DAY AS NEEDED FOR PAIN   Menthol, Topical Analgesic, (BENGAY EX) Apply 1 Application topically daily as needed (arthritis pain).   metFORMIN (GLUCOPHAGE) 1000 MG tablet TAKE 1 TABLET BY MOUTH TWICE A DAY WITH FOOD   methocarbamol (ROBAXIN) 500 MG tablet Take 1 tablet (500 mg total) by mouth every 6 (six) hours as needed for muscle spasms.   omeprazole (PRILOSEC) 40 MG capsule Take 1 capsule (40 mg total) by mouth daily. (Patient taking differently: Take 40 mg by mouth at bedtime.)   oxyCODONE-acetaminophen (PERCOCET/ROXICET) 5-325 MG tablet Take 1-2 tablets by mouth every 6 (six) hours as needed for moderate pain or severe pain.   pioglitazone  (ACTOS) 30 MG tablet TAKE 1 TABLET BY MOUTH EVERY DAY   Polyethyl Glycol-Propyl Glycol (SYSTANE OP) Place 1 drop into both eyes daily as needed (dry eyes/allergy season).   Red Yeast Rice Extract 600 MG CAPS Take 600 mg by mouth 2 (two) times daily.     Review of Systems      All other systems reviewed and are otherwise negative except as noted above.  Physical Exam    VS:  BP 136/62   Pulse 83   Ht 5\' 6"  (1.676 m)   Wt 209 lb 6.4 oz (95 kg)   SpO2 94%   BMI 33.80 kg/m  , BMI Body mass index is 33.8 kg/m.  Wt Readings from Last 3 Encounters:  10/15/22 209 lb 6.4 oz (95 kg)  10/08/22 221 lb (100.2 kg)  09/30/22 208 lb (94.3 kg)     GEN: Well nourished, well developed, in no acute distress. HEENT: normal. Neck: Supple, no JVD, carotid bruits, or masses. Cardiac: RRR, + systolic murmur, rubs, or gallops. No clubbing, cyanosis,  1+ LE edema.  Radials/PT 2+ and equal bilaterally.  Respiratory:  Respirations regular and unlabored, clear to auscultation bilaterally. GI: Soft, nontender, nondistended. MS: No deformity or atrophy. Skin: Warm and dry, no rash. Neuro:  Strength and sensation are intact. Psych: Normal affect.  Assessment & Plan    New onset Afib -now in NSR -Eliquis 5 mg twice a day,  atenolol 50 mg daily, Farxiga 5 mg daily, HCTZ 25 mg daily, magnesium 250 mg daily -Reviewed information about DCCV but hopefully wont be needed  HTN -136/62 Continue current medication regimen and continue to monitor blood pressure at home- -low sodium diet  Hypothyroidism -TSH 0.872 -Per primary care  T2DM -A1C 8.0        Disposition: Follow up 3 months with Larae Grooms, MD or APP.  Signed, Elgie Collard, PA-C 10/15/2022, 9:17 PM [facility]

## 2022-10-16 DIAGNOSIS — M4714 Other spondylosis with myelopathy, thoracic region: Secondary | ICD-10-CM | POA: Diagnosis not present

## 2022-10-16 MED FILL — Sodium Chloride IV Soln 0.9%: INTRAVENOUS | Qty: 2000 | Status: AC

## 2022-10-16 MED FILL — Heparin Sodium (Porcine) Inj 1000 Unit/ML: INTRAMUSCULAR | Qty: 30 | Status: AC

## 2022-10-18 ENCOUNTER — Other Ambulatory Visit: Payer: Self-pay | Admitting: Internal Medicine

## 2022-10-18 ENCOUNTER — Ambulatory Visit: Payer: Self-pay

## 2022-10-18 NOTE — Patient Instructions (Signed)
Visit Information  Thank you for taking time to visit with me today. Please don't hesitate to contact me if I can be of assistance to you.   Following are the goals we discussed today:   Goals Addressed             This Visit's Progress    continue to improve post hospitalization       Interventions Today    Flowsheet Row Most Recent Value  Chronic Disease   Chronic disease during today's visit Hypertension (HTN), Atrial Fibrillation (AFib), Other  [post hospitalization spinal surgery]  General Interventions   General Interventions Discussed/Reviewed General Interventions Discussed, Doctor Visits  [care coordination program discussed with patient. RNCM contact number provided.]  Doctor Visits Discussed/Reviewed Doctor Visits Discussed  PCP/Specialist Visits Compliance with follow-up visit  Exercise Interventions   Exercise Discussed/Reviewed Exercise Discussed  [encouraged to perfomr exercises as recommended by Frances Furbish therapist]  Education Interventions   Education Provided Provided Education  Provided Verbal Education On Other, When to see the doctor  [signs/symptoms of atrial fibrillation.patient declines any educations re: atrial fib at this time. she reports they have received educational material from provider.]  Pharmacy Interventions   Pharmacy Dicussed/Reviewed Pharmacy Topics Discussed, Medications and their functions, Medication Adherence, Affording Medications  Safety Interventions   Safety Discussed/Reviewed Safety Discussed, Fall Risk            Our next appointment is by telephone on 11/01/22 at 10 am  Please call the care guide team at [phone] if you need to cancel or reschedule your appointment.   If you are experiencing a Mental Health or Behavioral Health Crisis or need someone to talk to, please call the Suicide and Crisis Lifeline: 68  Kathyrn Sheriff, RN, MSN, BSN, CCM [facility] Care Coordinator [phone]

## 2022-10-18 NOTE — Patient Outreach (Signed)
  Care Coordination   Initial Visit Note   10/18/2022 Name: Kelsey Keller MRN: [id number] DOB: 02/05/1941  Kelsey Keller is a 82 y.o. year old female who sees Corwin Levins, MD for primary care. I spoke with  [name] by phone today.  What matters to the patients health and wellness today?  Admitted 09/30/22-10/03/22 spinal surgery/acute blood loss anemia/new onset a-fib. She reports she had follow up with neurosurgeon on 10/06/22; PCP on 10/08/22 and cardiologist on 10/09/22. She reports and per review of chart, she is back in regular rhythm. She reports she has clarified all medications and denies any questions or concerns regarding medications. Ms. Dockweiler states she will be staying with her daughter for the upcoming month.  Goals Addressed             This Visit's Progress    continue to improve post hospitalization       Interventions Today    Flowsheet Row Most Recent Value  Chronic Disease   Chronic disease during today's visit Hypertension (HTN), Atrial Fibrillation (AFib), Other  [post hospitalization spinal surgery]  General Interventions   General Interventions Discussed/Reviewed General Interventions Discussed, Doctor Visits  [care coordination program discussed with patient. RNCM contact number provided.]  Doctor Visits Discussed/Reviewed Doctor Visits Discussed  PCP/Specialist Visits Compliance with follow-up visit  Exercise Interventions   Exercise Discussed/Reviewed Exercise Discussed  [encouraged to perfomr exercises as recommended by Frances Furbish therapist]  Education Interventions   Education Provided Provided Education  Provided Verbal Education On Other, When to see the doctor  [signs/symptoms of atrial fibrillation.patient declines any educations re: atrial fib at this time. she reports they have received educational material from provider.]  Pharmacy Interventions   Pharmacy Dicussed/Reviewed Pharmacy Topics Discussed, Medications and their functions, Medication  Adherence, Affording Medications  Safety Interventions   Safety Discussed/Reviewed Safety Discussed, Fall Risk            SDOH assessments and interventions completed:  Yes  SDOH Interventions Today    Flowsheet Row Most Recent Value  SDOH Interventions   Utilities Interventions Intervention Not Indicated     Care Coordination Interventions:  Yes, provided   Follow up plan: Follow up call scheduled for 11/01/22    Encounter Outcome:  Pt. Visit Completed   Kathyrn Sheriff, RN, MSN, BSN, CCM [facility] Care Coordinator [phone]

## 2022-10-28 ENCOUNTER — Other Ambulatory Visit (HOSPITAL_COMMUNITY): Payer: Self-pay

## 2022-11-01 ENCOUNTER — Ambulatory Visit: Payer: Self-pay

## 2022-11-01 NOTE — Patient Instructions (Addendum)
Visit Information  Thank you for taking time to visit with me today. Please don't hesitate to contact me if I can be of assistance to you.   Following are the goals we discussed today:  Continue to take medications as prescribed Continue to attend provider appointments as scheduled Contact RN Case Manager with any care coordination or resource needs.   Our next appointment is by telephone on 12/02/22 at 9:30 am  Please call the care guide team at [phone] if you need to cancel or reschedule your appointment.   If you are experiencing a Mental Health or Behavioral Health Crisis or need someone to talk to, please call the Suicide and Crisis Lifeline: 24   Kathyrn Sheriff, RN, MSN, BSN, CCM [facility] Coordinator [phone]

## 2022-11-01 NOTE — Patient Outreach (Signed)
  Care Coordination   Follow Up Visit Note   11/01/2022 Name: Kelsey Keller MRN: [id number] DOB: 28-Oct-1940  Kelsey Keller is a 82 y.o. year old female who sees Kelsey Levins, MD for primary care. I spoke with  Kelsey Keller by phone today.  What matters to the patients health and wellness today?  Kelsey Keller reports she is still staying with her daughter. She states she has followed up with Kelsey Keller and denies any questions or concerns at this time.  Goals Addressed             This Visit's Progress    continue to improve post hospitalization       Interventions Today    Flowsheet Row Most Recent Value  Chronic Disease   Chronic disease during today's visit Other  [post hospitalization spinal surgery]  General Interventions   General Interventions Discussed/Reviewed General Interventions Reviewed, Doctor Visits  [facility] encouraged patient to contact RNCM if care coordination needs. confirmed patient has RNCM contact number.]  Doctor Visits Discussed/Reviewed Doctor Visits Reviewed  [encouraged to continue to follow up with provider as recommended.]  PCP/Specialist Visits Compliance with follow-up visit  Education Interventions   Education Provided Provided Education  Provided Verbal Education On Other  [encouraged to continue to attend provider visits as schedule. encouraged to contact provider with health questions/concerns as needed, encouraged to continue to take medications as prescribed.]            SDOH assessments and interventions completed:  No  Care Coordination Interventions:  Yes, provided   Follow up plan: Follow up call scheduled for 12/02/22    Encounter Outcome:  Pt. Visit Completed   Kathyrn Sheriff, RN, MSN, BSN, CCM [facility] Care Coordinator [phone]

## 2022-11-13 DIAGNOSIS — M4714 Other spondylosis with myelopathy, thoracic region: Secondary | ICD-10-CM | POA: Diagnosis not present

## 2022-11-13 DIAGNOSIS — Z6836 Body mass index (BMI) 36.0-36.9, adult: Secondary | ICD-10-CM | POA: Diagnosis not present

## 2022-12-02 ENCOUNTER — Ambulatory Visit: Payer: Self-pay

## 2022-12-02 ENCOUNTER — Telehealth: Payer: Self-pay

## 2022-12-02 NOTE — Patient Outreach (Signed)
  Care Coordination   Follow Up Visit Note   12/02/2022 Name: Kelsey Keller MRN: [id number] DOB: Dec 22, 1940  Kelsey Keller is a 82 y.o. year old female who sees Corwin Levins, MD for primary care. I spoke with  [name] by phone today.  What matters to the patients health and wellness today?  Kelsey Keller, reports she is doing better. She states she no longer has to wear a back brace and is living back in her home now. She reports home health therapy ended and states she is going to the [facility] now. She reports the therapist showed her what exercises she could do the gym and how to use the machines. Kelsey Keller states one are on left side of her knee with discomfort, but states she will give her exercises time to see if the exercises will help. She states she will follow up with PCP, if it continues to bother her or worsens.  Kelsey Keller denies any questions or concerns at this time.  Goals Addressed             This Visit's Progress    continue to improve post hospitalization       Interventions Today    Flowsheet Row Most Recent Value  Chronic Disease   Chronic disease during today's visit Other, Hypertension (HTN), Atrial Fibrillation (AFib)  [post hospitalization, spinal surgery]  General Interventions   General Interventions Discussed/Reviewed General Interventions Reviewed, Doctor Visits  [provided positive feedback regarding self management of health]  Doctor Visits Discussed/Reviewed Doctor Visits Reviewed, Specialist  PCP/Specialist Visits Compliance with follow-up visit  [patient reports upcoming appointment with Dr. Danielle Dess on 01/08/23 and PCP on 01/07/23]  Exercise Interventions   Exercise Discussed/Reviewed Exercise Reviewed  Education Interventions   Education Provided Provided Education  [advised to contact provider if condition worsens or does not improve, encouraged to continue to take medications as prescribed, attend provider visits as recommended.]  Nutrition  Interventions   Nutrition Discussed/Reviewed Nutrition Reviewed  Pharmacy Interventions   Pharmacy Dicussed/Reviewed Pharmacy Topics Reviewed  Safety Interventions   Safety Discussed/Reviewed Safety Reviewed            SDOH assessments and interventions completed:  No  Care Coordination Interventions:  Yes, provided   Follow up plan: Follow up call scheduled for 01/10/23    Encounter Outcome:  Pt. Visit Completed   Kathyrn Sheriff, RN, MSN, BSN, CCM [facility] Care Coordinator [phone]

## 2022-12-02 NOTE — Patient Instructions (Addendum)
Visit Information  Thank you for taking time to visit with me today. Please don't hesitate to contact me if I can be of assistance to you.   Following are the goals we discussed today:  Continue to take medications as prescribed. Continue to attend provider visits as scheduled Contact provider if condition worsens or does not improve Continue to eat healthy, lean meats, vegetables, fruits, avoid saturated and transfats   Our next appointment is by telephone on 01/10/23 at 9:00 am  Please call the care guide team at [phone] if you need to cancel or reschedule your appointment.   If you are experiencing a Mental Health or Behavioral Health Crisis or need someone to talk to, please call the Suicide and Crisis Lifeline: 37  Kathyrn Sheriff, RN, MSN, BSN, CCM [facility] Care Coordinator [phone]

## 2022-12-02 NOTE — Patient Outreach (Signed)
  Care Coordination   12/02/2022 Name: Kelsey Keller MRN: [id number] DOB: April 01, 1941   Care Coordination Outreach Attempts:  An unsuccessful telephone outreach was attempted for a scheduled appointment today.  Follow Up Plan:  Additional outreach attempts will be made to offer the patient care coordination information and services.   Encounter Outcome:  No Answer   Care Coordination Interventions:  No, not indicated    Kathyrn Sheriff, RN, MSN, BSN, CCM [facility] Care Coordinator [phone]

## 2022-12-06 ENCOUNTER — Other Ambulatory Visit: Payer: Self-pay | Admitting: Internal Medicine

## 2023-01-07 ENCOUNTER — Encounter: Payer: Self-pay | Admitting: Internal Medicine

## 2023-01-07 ENCOUNTER — Ambulatory Visit (INDEPENDENT_AMBULATORY_CARE_PROVIDER_SITE_OTHER): Payer: Medicare HMO | Admitting: Internal Medicine

## 2023-01-07 VITALS — BP 124/78 | HR 82 | Temp 97.8°F | Ht 66.0 in | Wt 213.0 lb

## 2023-01-07 DIAGNOSIS — E7800 Pure hypercholesterolemia, unspecified: Secondary | ICD-10-CM

## 2023-01-07 DIAGNOSIS — K2970 Gastritis, unspecified, without bleeding: Secondary | ICD-10-CM

## 2023-01-07 DIAGNOSIS — Z7984 Long term (current) use of oral hypoglycemic drugs: Secondary | ICD-10-CM

## 2023-01-07 DIAGNOSIS — D649 Anemia, unspecified: Secondary | ICD-10-CM

## 2023-01-07 DIAGNOSIS — E1165 Type 2 diabetes mellitus with hyperglycemia: Secondary | ICD-10-CM

## 2023-01-07 DIAGNOSIS — R1013 Epigastric pain: Secondary | ICD-10-CM

## 2023-01-07 DIAGNOSIS — E559 Vitamin D deficiency, unspecified: Secondary | ICD-10-CM

## 2023-01-07 DIAGNOSIS — E538 Deficiency of other specified B group vitamins: Secondary | ICD-10-CM | POA: Diagnosis not present

## 2023-01-07 DIAGNOSIS — I10 Essential (primary) hypertension: Secondary | ICD-10-CM

## 2023-01-07 DIAGNOSIS — N1831 Chronic kidney disease, stage 3a: Secondary | ICD-10-CM | POA: Diagnosis not present

## 2023-01-07 DIAGNOSIS — K269 Duodenal ulcer, unspecified as acute or chronic, without hemorrhage or perforation: Secondary | ICD-10-CM

## 2023-01-07 LAB — CBC WITH DIFFERENTIAL/PLATELET
Basophils Absolute: 0 10*3/uL (ref 0.0–0.1)
Basophils Relative: 0.7 % (ref 0.0–3.0)
Eosinophils Absolute: 0.1 10*3/uL (ref 0.0–0.7)
Eosinophils Relative: 3.3 % (ref 0.0–5.0)
HCT: 30 % — ABNORMAL LOW (ref 36.0–46.0)
Hemoglobin: 9.3 g/dL — ABNORMAL LOW (ref 12.0–15.0)
Lymphocytes Relative: 23.3 % (ref 12.0–46.0)
Lymphs Abs: 1 10*3/uL (ref 0.7–4.0)
MCHC: 31.1 g/dL (ref 30.0–36.0)
MCV: 81.8 fl (ref 78.0–100.0)
Monocytes Absolute: 0.5 10*3/uL (ref 0.1–1.0)
Monocytes Relative: 10.7 % (ref 3.0–12.0)
Neutro Abs: 2.7 10*3/uL (ref 1.4–7.7)
Neutrophils Relative %: 62 % (ref 43.0–77.0)
Platelets: 148 10*3/uL — ABNORMAL LOW (ref 150.0–400.0)
RBC: 3.66 Mil/uL — ABNORMAL LOW (ref 3.87–5.11)
RDW: 17.2 % — ABNORMAL HIGH (ref 11.5–15.5)
WBC: 4.4 10*3/uL (ref 4.0–10.5)

## 2023-01-07 LAB — HEPATIC FUNCTION PANEL
ALT: 13 U/L (ref 0–35)
AST: 22 U/L (ref 0–37)
Albumin: 3.8 g/dL (ref 3.5–5.2)
Alkaline Phosphatase: 90 U/L (ref 39–117)
Bilirubin, Direct: 0.2 mg/dL (ref 0.0–0.3)
Total Bilirubin: 1.1 mg/dL (ref 0.2–1.2)
Total Protein: 6.6 g/dL (ref 6.0–8.3)

## 2023-01-07 LAB — BASIC METABOLIC PANEL
BUN: 24 mg/dL — ABNORMAL HIGH (ref 6–23)
CO2: 29 mEq/L (ref 19–32)
Calcium: 10.2 mg/dL (ref 8.4–10.5)
Chloride: 103 mEq/L (ref 96–112)
Creatinine, Ser: 0.98 mg/dL (ref 0.40–1.20)
GFR: 54.63 mL/min — ABNORMAL LOW (ref >60.00)
Glucose, Bld: 137 mg/dL — ABNORMAL HIGH (ref 70–99)
Potassium: 4.5 mEq/L (ref 3.5–5.1)
Sodium: 139 mEq/L (ref 135–145)

## 2023-01-07 LAB — HEMOGLOBIN A1C: Hgb A1c MFr Bld: 7.8 % — ABNORMAL HIGH (ref 4.6–6.5)

## 2023-01-07 LAB — LIPID PANEL
Cholesterol: 135 mg/dL (ref 0–200)
HDL: 56.3 mg/dL (ref >39.00)
LDL Cholesterol: 66 mg/dL (ref 0–99)
NonHDL: 78.96
Total CHOL/HDL Ratio: 2
Triglycerides: 66 mg/dL (ref 0.0–149.0)
VLDL: 13.2 mg/dL (ref 0.0–40.0)

## 2023-01-07 LAB — VITAMIN B12: Vitamin B-12: 741 pg/mL (ref 211–911)

## 2023-01-07 LAB — VITAMIN D 25 HYDROXY (VIT D DEFICIENCY, FRACTURES): VITD: 39.81 ng/mL (ref 30.00–100.00)

## 2023-01-07 MED ORDER — DAPAGLIFLOZIN PROPANEDIOL 5 MG PO TABS: 5.0000 mg | ORAL_TABLET | Freq: Every day | ORAL | 3 refills | Status: DC

## 2023-01-07 MED ORDER — PIOGLITAZONE HCL 30 MG PO TABS: 30.0000 mg | ORAL_TABLET | Freq: Every day | ORAL | 3 refills | Status: DC

## 2023-01-07 MED ORDER — OMEPRAZOLE 40 MG PO CPDR
40.0000 mg | DELAYED_RELEASE_CAPSULE | Freq: Every day | ORAL | 3 refills | Status: DC
Start: 2023-01-07 — End: 2023-11-04

## 2023-01-07 MED ORDER — LEVOTHYROXINE SODIUM 125 MCG PO TABS: ORAL_TABLET | ORAL | 3 refills | Status: DC

## 2023-01-07 MED ORDER — HYDROCHLOROTHIAZIDE 25 MG PO TABS: 25.0000 mg | ORAL_TABLET | Freq: Every day | ORAL | 3 refills | Status: DC

## 2023-01-07 MED ORDER — METFORMIN HCL 1000 MG PO TABS: 1000.0000 mg | ORAL_TABLET | Freq: Two times a day (BID) | ORAL | 3 refills | Status: DC

## 2023-01-07 MED ORDER — ATENOLOL 50 MG PO TABS: 50.0000 mg | ORAL_TABLET | Freq: Every day | ORAL | 3 refills | Status: DC

## 2023-01-07 NOTE — Patient Instructions (Signed)

## 2023-01-07 NOTE — Progress Notes (Signed)
Patient ID: Kelsey Keller Keller, female   DOB: 07/04/1941, 82 y.o.   MRN: [id number]        Chief Complaint: follow up HTN, HLD and hyperglycemia, low vit d and ckd       HPI:  Kelsey Keller Keller is a 82 y.o. female here overall doing ok,  Pt denies chest pain, increased sob or doe, wheezing, orthopnea, PND, increased LE swelling, palpitations, dizziness or syncope.   Pt denies polydipsia, polyuria, or new focal neuro s/s.    Pt denies fever, wt loss, night sweats, loss of appetite, or other constitutional symptoms   Thinks she is having IBS with less good bowel control recently, but Denies worsening reflux, abd pain, dysphagia, n/v, bowel change or blood.  Taking immodium prn at higher dose than before, the 2 pills instead of 1.   Also, Endorses no pain to the lower back, but having some worsening diffulty walking for some reason, to see Dr Danielle Dess NS tomorrow. Using walker today due to less good balance.  No falling.  Overall lost wt since last visit.   Wt Readings from Last 3 Encounters:  01/07/23 213 lb (96.6 kg)  10/15/22 209 lb 6.4 oz (95 kg)  10/08/22 221 lb (100.2 kg)   BP Readings from Last 3 Encounters:  01/07/23 124/78  10/15/22 136/62  10/08/22 132/80         Past Medical History:  Diagnosis Date   ALLERGIC RHINITIS 04/29/2007   Allergy    Basal cell carcinoma 03/24/2018   sclerosis- upper bridge of nose (MOHS)   BCC (basal cell carcinoma) 10/18/2019   right supra orbital region- Cx3 53fu   Cataract    both eyes   DIABETES MELLITUS, TYPE II 06/23/2007   type 2   Elevated liver enzymes 03/2017   Elevated tumor markers    GERD (gastroesophageal reflux disease)    Heart murmur    per patient   HIATAL HERNIA, HX OF 04/29/2007   History of hiatal hernia    HYPERLIPIDEMIA 04/29/2007   diet controlled on CoQ10   HYPERTENSION 04/29/2007   HYPOKALEMIA 05/02/2009   HYPOTHYROIDISM 04/29/2007   LOW BACK PAIN 04/29/2007   Neuromuscular disorder (HCC)    diabetic neuropathy in both  legs   OSTEOARTHRITIS, KNEE, LEFT 06/23/2007   OSTEOPOROSIS NOS 05/12/2007   SPINAL STENOSIS, LUMBAR 04/29/2007   Past Surgical History:  Procedure Laterality Date   BACK SURGERY N/A 2014 x 2   T1 TO S 1    CATARACT EXTRACTION W/ INTRAOCULAR LENS  IMPLANT, BILATERAL Bilateral 05/2011   doing well   COLONOSCOPY WITH PROPOFOL N/A 04/10/2017   Procedure: COLONOSCOPY WITH PROPOFOL;  Surgeon: Rachael Fee, MD;  Location: [facility];  Service: Endoscopy;  Laterality: N/A;   COLONSCOPY  2011   JOINT REPLACEMENT     TOTAL KNEE ARTHROPLASTY  2006   right   TOTAL KNEE ARTHROPLASTY Left 2012    reports that she has never smoked. She has never used smokeless tobacco. She reports current alcohol use. She reports that she does not use drugs. family history includes Arthritis in her brother and mother; COPD in her brother; Colon polyps in an other family member; Coronary artery disease in her father and another family member; Diabetes in an other family member; Hyperlipidemia in her brother; Hypertension in her mother; Stroke in her brother. No Known Allergies Current Outpatient Medications on File Prior to Visit  Medication Sig Dispense Refill   aspirin 81 MG EC tablet Take  81 mg by mouth at bedtime.     blood glucose meter kit and supplies KIT Dispense with patient  Insurance; Use up to four times daily as directed. E11.9 1 each 0   Blood Glucose Monitoring Suppl (ONE TOUCH ULTRA 2) w/Device KIT Use as directed up to 4 times per day E11.9 1 each 0   Cholecalciferol (VITAMIN D3) 50 MCG (2000 UT) TABS Take 2,000 Units by mouth daily.     Co-Enzyme Q-10 100 MG CAPS Take 100 mg by mouth daily.     cyanocobalamin 1000 MCG tablet Take 1,000 mcg by mouth daily.     fluticasone (FLONASE) 50 MCG/ACT nasal spray SPRAY 2 SPRAYS INTO EACH NOSTRIL EVERY DAY (Patient taking differently: Place 2 sprays into both nostrils daily as needed for allergies.) 48 mL 3   glucose blood (ONETOUCH VERIO) test strip USE  TO CHECK BLOOD SUGARS TWICE A DAY 200 strip 0   Lancets MISC Use to check blood sugars twice a day 200 each 0   Magnesium 250 MG TABS Take 250 mg by mouth daily.     meloxicam (MOBIC) 7.5 MG tablet TAKE 1 TABLET BY MOUTH EVERY DAY AS NEEDED FOR PAIN 90 tablet 1   Menthol, Topical Analgesic, (BENGAY EX) Apply 1 Application topically daily as needed (arthritis pain).     Polyethyl Glycol-Propyl Glycol (SYSTANE OP) Place 1 drop into both eyes daily as needed (dry eyes/allergy season).     Red Yeast Rice Extract 600 MG CAPS Take 600 mg by mouth 2 (two) times daily.     No current facility-administered medications on file prior to visit.        ROS:  All others reviewed and negative.  Objective        PE:  BP 124/78 (BP Location: Right Arm, Patient Position: Sitting, Cuff Size: Normal)   Pulse 82   Temp 97.8 F (36.6 C) (Oral)   Ht 5\' 6"  (1.676 m)   Wt 213 lb (96.6 kg)   SpO2 97%   BMI 34.38 kg/m                 Constitutional: Pt appears in NAD               HENT: Head: NCAT.                Right Ear: External ear normal.                 Left Ear: External ear normal.                Eyes: . Pupils are equal, round, and reactive to light. Conjunctivae and EOM are normal               Nose: without d/c or deformity               Neck: Neck supple. Gross normal ROM               Cardiovascular: Normal rate and regular rhythm.                 Pulmonary/Chest: Effort normal and breath sounds without rales or wheezing.                Abd:  Soft, NT, ND, + BS, no organomegaly               Neurological: Pt is alert. At baseline orientation, motor grossly intact  Skin: Skin is warm. No rashes, no other new lesions, LE edema - none               Psychiatric: Pt behavior is normal without agitation   Micro: none  Cardiac tracings I have personally interpreted today:  none  Pertinent Radiological findings (summarize): none   Lab Results  Component Value Date   WBC 4.4  01/07/2023   HGB 9.3 (L) 01/07/2023   HCT 30.0 (L) 01/07/2023   PLT 148.0 (L) 01/07/2023   GLUCOSE 137 (H) 01/07/2023   CHOL 135 01/07/2023   TRIG 66.0 01/07/2023   HDL 56.30 01/07/2023   LDLDIRECT 107.5 07/21/2013   LDLCALC 66 01/07/2023   ALT 13 01/07/2023   AST 22 01/07/2023   NA 139 01/07/2023   K 4.5 01/07/2023   CL 103 01/07/2023   CREATININE 0.98 01/07/2023   BUN 24 (H) 01/07/2023   CO2 29 01/07/2023   TSH 0.872 10/01/2022   INR 1.0 09/18/2021   HGBA1C 7.8 (H) 01/07/2023   MICROALBUR 13.8 (H) 09/09/2022   Assessment/Plan:  Kelsey Keller Keller is a 82 y.o. White or Caucasian [1] female with  has a past medical history of ALLERGIC RHINITIS (04/29/2007), Allergy, Basal cell carcinoma (03/24/2018), BCC (basal cell carcinoma) (10/18/2019), Cataract, DIABETES MELLITUS, TYPE II (06/23/2007), Elevated liver enzymes (03/2017), Elevated tumor markers, GERD (gastroesophageal reflux disease), Heart murmur, HIATAL HERNIA, HX OF (04/29/2007), History of hiatal hernia, HYPERLIPIDEMIA (04/29/2007), HYPERTENSION (04/29/2007), HYPOKALEMIA (05/02/2009), HYPOTHYROIDISM (04/29/2007), LOW BACK PAIN (04/29/2007), Neuromuscular disorder (HCC), OSTEOARTHRITIS, KNEE, LEFT (06/23/2007), OSTEOPOROSIS NOS (05/12/2007), and SPINAL STENOSIS, LUMBAR (04/29/2007).  Diabetes Lab Results  Component Value Date   HGBA1C 7.8 (H) 01/07/2023   Uncontrolled,, pt to continue current medical treatment farxiga 10 every day, metformin 1000 bid, actos 30 every day as declines further change for now   Hyperlipidemia Lab Results  Component Value Date   LDLCALC 66 01/07/2023   Stable, pt to continue current DM diet, declines statin for now   Essential hypertension BP Readings from Last 3 Encounters:  01/07/23 124/78  10/15/22 136/62  10/08/22 132/80   Stable, pt to continue medical treatment tenormin 50 every day, hct 25 qd   Vitamin D deficiency Last vitamin D Lab Results  Component Value Date   VD25OH  39.81 01/07/2023   Low, to start oral replacement   CKD (chronic kidney disease) stage 3, GFR 30-59 ml/min (HCC) Lab Results  Component Value Date   CREATININE 0.98 01/07/2023   Stable overall, cont to avoid nephrotoxins   B12 deficiency Lab Results  Component Value Date   VITAMINB12 741 01/07/2023   Stable, cont oral replacement - b12 1000 mcg qd   Anemia Lab Results  Component Value Date   WBC 4.4 01/07/2023   HGB 9.3 (L) 01/07/2023   HCT 30.0 (L) 01/07/2023   MCV 81.8 01/07/2023   PLT 148.0 (L) 01/07/2023   Stable overall, cont monitor  Followup: Return in about 6 months (around 07/09/2023).  Oliver Barre, MD 01/11/2023 1:11 PM [facility]

## 2023-01-08 ENCOUNTER — Other Ambulatory Visit: Payer: Self-pay | Admitting: Internal Medicine

## 2023-01-08 ENCOUNTER — Telehealth: Payer: Self-pay | Admitting: Internal Medicine

## 2023-01-08 DIAGNOSIS — G959 Disease of spinal cord, unspecified: Secondary | ICD-10-CM | POA: Diagnosis not present

## 2023-01-08 DIAGNOSIS — Z6836 Body mass index (BMI) 36.0-36.9, adult: Secondary | ICD-10-CM | POA: Diagnosis not present

## 2023-01-08 MED ORDER — DAPAGLIFLOZIN PROPANEDIOL 10 MG PO TABS: 10.0000 mg | ORAL_TABLET | Freq: Every day | ORAL | 3 refills | Status: DC

## 2023-01-08 MED ORDER — TRAMADOL HCL 50 MG PO TABS: 50.0000 mg | ORAL_TABLET | Freq: Three times a day (TID) | ORAL | 5 refills | Status: DC | PRN

## 2023-01-08 NOTE — Telephone Encounter (Signed)
Done erx 

## 2023-01-08 NOTE — Telephone Encounter (Signed)
Prescription Request  01/08/2023  LOV: 01/07/2023  What is the name of the medication or equipment?  traMADol (ULTRAM) 50 MG tablet   Have you contacted your pharmacy to request a refill? Yes   Which pharmacy would you like this sent to?  [id number] - [place], [place] - [address] AT CORNER OF [facility] [address] [place] [address] Phone: [phone] Fax: [phone]    Patient notified that their request is being sent to the clinical staff for review and that they should receive a response within 2 business days.   Please advise at Mobile [phone] (mobile)

## 2023-01-10 ENCOUNTER — Ambulatory Visit: Payer: Self-pay

## 2023-01-10 NOTE — Patient Outreach (Signed)
  Care Coordination   Initial Visit Note   01/10/2023 Name: Kelsey Keller MRN: [id number] DOB: 08-Jul-1941  Kelsey Keller is a 82 y.o. year old female who sees Kelsey Levins, MD for primary care. I spoke with  Kelsey Keller by phone today.  What matters to the patients health and wellness today?  Kelsey Keller reports, since surgery some things are better, but reports she started sleeping in a bed a couple of days ago and noted: pain noted bottom down leg. Office visit with Dr. Danielle Dess completed 01/09/23-plans for MRI to evaluate increase difficulty with walking. She states she is going to contact neurosurgery to notify of this. She also reports office visit on 66/25/24.  Per Patient, Kelsey Keller increased. Ms. Kelsey Keller reports pharmacy called her and informed that insurance not authorizing fill on increased dose Farxiga due to medication just filled in May.  Goals Addressed             This Visit's Progress    Assist with health managment       Interventions Today    Flowsheet Row Most Recent Value  Chronic Disease   Chronic disease during today's visit Diabetes, Other  General Interventions   General Interventions Discussed/Reviewed General Interventions Reviewed, Doctor Visits, Durable Medical Equipment (DME)  Doctor Visits Discussed/Reviewed Doctor Visits Reviewed  Durable Medical Equipment (DME) Other, Wheelchair  [options for chairs discussed with patient-plans to vacation this summer. also discussed process for obtaining motorized chair needed in the future.]  Wheelchair Motorized, [employer] chair options discussed as well as special chairs that can be rented to go onto the beach]  Education Interventions   Education Provided --  [advised to notify Retail buyer provider of symptoms associated with sleeping in bed]  Pharmacy Interventions   Pharmacy Dicussed/Reviewed Pharmacy Topics Reviewed  [advised patient to notify PCP- (unable to get increase dose fill) per pharmacy due to  just filled in may. encouraged to seek further direction from PCP.]  Safety Interventions   Safety Discussed/Reviewed Fall Risk, Safety Reviewed  [discussed fall prevention strategies]           COMPLETED: continue to improve post hospitalization       Interventions Today    Flowsheet Row Most Recent Value  Chronic Disease   Chronic disease during today's visit Other, Hypertension (HTN), Atrial Fibrillation (AFib)  [post hospitalization, spinal surgery]  General Interventions   General Interventions Discussed/Reviewed General Interventions Reviewed, Doctor Visits  [provided positive feedback regarding self management of health]  Doctor Visits Discussed/Reviewed Doctor Visits Reviewed, Specialist  PCP/Specialist Visits Compliance with follow-up visit  [patient reports upcoming appointment with Dr. Danielle Dess on 01/08/23 and PCP on 01/07/23]  Exercise Interventions   Exercise Discussed/Reviewed Exercise Reviewed  Education Interventions   Education Provided Provided Education  [advised to contact provider if condition worsens or does not improve, encouraged to continue to take medications as prescribed, attend provider visits as recommended.]  Nutrition Interventions   Nutrition Discussed/Reviewed Nutrition Reviewed  Pharmacy Interventions   Pharmacy Dicussed/Reviewed Pharmacy Topics Reviewed  Safety Interventions   Safety Discussed/Reviewed Safety Reviewed            SDOH assessments and interventions completed:  No  Care Coordination Interventions:  Yes, provided   Follow up plan: Follow up call scheduled for -    Encounter Outcome:  Pt. Visit Completed   Kathyrn Sheriff, RN, MSN, BSN, CCM Care Coordinator [phone]

## 2023-01-10 NOTE — Patient Instructions (Signed)
Visit Information  Thank you for taking time to visit with me today. Please don't hesitate to contact me if I can be of assistance to you.   Following are the goals we discussed today:  Continue to take medications as prescribed Continue to attend provider visits as scheduled Contact your provider with health questions or concerns as needed  Our next appointment is by telephone on 03/14/23 at 9:00 am  Please call the care guide team at [phone] if you need to cancel or reschedule your appointment.   If you are experiencing a Mental Health or Behavioral Health Crisis or need someone to talk to, please call the Suicide and Crisis Lifeline: 6  Kathyrn Sheriff, RN, MSN, BSN, CCM [facility] Care Coordinator [phone]

## 2023-01-11 ENCOUNTER — Encounter: Payer: Self-pay | Admitting: Internal Medicine

## 2023-01-11 ENCOUNTER — Other Ambulatory Visit: Payer: Self-pay | Admitting: Physician Assistant

## 2023-01-11 NOTE — Assessment & Plan Note (Signed)
BP Readings from Last 3 Encounters:  01/07/23 124/78  10/15/22 136/62  10/08/22 132/80   Stable, pt to continue medical treatment tenormin 50 every day, hct 25 qd

## 2023-01-11 NOTE — Assessment & Plan Note (Signed)
Lab Results  Component Value Date   VITAMINB12 741 01/07/2023   Stable, cont oral replacement - b12 1000 mcg qd

## 2023-01-11 NOTE — Assessment & Plan Note (Signed)
Lab Results  Component Value Date   HGBA1C 7.8 (H) 01/07/2023   Uncontrolled,, pt to continue current medical treatment farxiga 10 every day, metformin 1000 bid, actos 30 every day as declines further change for now

## 2023-01-11 NOTE — Assessment & Plan Note (Signed)
Last vitamin D Lab Results  Component Value Date   VD25OH 39.81 01/07/2023   Low, to start oral replacement

## 2023-01-11 NOTE — Assessment & Plan Note (Signed)
Lab Results  Component Value Date   CREATININE 0.98 01/07/2023   Stable overall, cont to avoid nephrotoxins

## 2023-01-11 NOTE — Assessment & Plan Note (Addendum)
Lab Results  Component Value Date   LDLCALC 66 01/07/2023   Stable, pt to continue current DM diet, declines statin for now

## 2023-01-11 NOTE — Assessment & Plan Note (Signed)
Lab Results  Component Value Date   WBC 4.4 01/07/2023   HGB 9.3 (L) 01/07/2023   HCT 30.0 (L) 01/07/2023   MCV 81.8 01/07/2023   PLT 148.0 (L) 01/07/2023   Stable overall, cont monitor

## 2023-01-27 ENCOUNTER — Other Ambulatory Visit: Payer: Self-pay | Admitting: Internal Medicine

## 2023-01-27 DIAGNOSIS — K589 Irritable bowel syndrome without diarrhea: Secondary | ICD-10-CM | POA: Insufficient documentation

## 2023-01-27 DIAGNOSIS — Z1231 Encounter for screening mammogram for malignant neoplasm of breast: Secondary | ICD-10-CM

## 2023-01-27 DIAGNOSIS — N762 Acute vulvitis: Secondary | ICD-10-CM | POA: Diagnosis not present

## 2023-01-29 ENCOUNTER — Ambulatory Visit
Admission: RE | Admit: 2023-01-29 | Discharge: 2023-01-29 | Disposition: A | Discharge status: Home / Self Care | Payer: Medicare HMO | Source: Ambulatory Visit | Attending: Internal Medicine | Admitting: Internal Medicine

## 2023-01-29 DIAGNOSIS — Z1231 Encounter for screening mammogram for malignant neoplasm of breast: Secondary | ICD-10-CM

## 2023-01-30 DIAGNOSIS — G959 Disease of spinal cord, unspecified: Secondary | ICD-10-CM | POA: Diagnosis not present

## 2023-02-12 ENCOUNTER — Encounter (INDEPENDENT_AMBULATORY_CARE_PROVIDER_SITE_OTHER): Payer: Self-pay

## 2023-02-13 DIAGNOSIS — M62838 Other muscle spasm: Secondary | ICD-10-CM | POA: Diagnosis not present

## 2023-02-13 DIAGNOSIS — M810 Age-related osteoporosis without current pathological fracture: Secondary | ICD-10-CM | POA: Diagnosis not present

## 2023-02-13 DIAGNOSIS — E785 Hyperlipidemia, unspecified: Secondary | ICD-10-CM | POA: Diagnosis not present

## 2023-02-13 DIAGNOSIS — Z79899 Other long term (current) drug therapy: Secondary | ICD-10-CM | POA: Diagnosis not present

## 2023-02-13 DIAGNOSIS — I129 Hypertensive chronic kidney disease with stage 1 through stage 4 chronic kidney disease, or unspecified chronic kidney disease: Secondary | ICD-10-CM | POA: Diagnosis not present

## 2023-02-13 DIAGNOSIS — M4800 Spinal stenosis, site unspecified: Secondary | ICD-10-CM | POA: Diagnosis not present

## 2023-02-13 DIAGNOSIS — E039 Hypothyroidism, unspecified: Secondary | ICD-10-CM | POA: Diagnosis not present

## 2023-02-13 DIAGNOSIS — R32 Unspecified urinary incontinence: Secondary | ICD-10-CM | POA: Diagnosis not present

## 2023-02-13 DIAGNOSIS — K219 Gastro-esophageal reflux disease without esophagitis: Secondary | ICD-10-CM | POA: Diagnosis not present

## 2023-02-13 DIAGNOSIS — J309 Allergic rhinitis, unspecified: Secondary | ICD-10-CM | POA: Diagnosis not present

## 2023-02-13 DIAGNOSIS — R269 Unspecified abnormalities of gait and mobility: Secondary | ICD-10-CM | POA: Diagnosis not present

## 2023-02-14 DIAGNOSIS — M4714 Other spondylosis with myelopathy, thoracic region: Secondary | ICD-10-CM | POA: Diagnosis not present

## 2023-02-14 DIAGNOSIS — Z6836 Body mass index (BMI) 36.0-36.9, adult: Secondary | ICD-10-CM | POA: Diagnosis not present

## 2023-03-10 ENCOUNTER — Telehealth: Payer: Self-pay | Admitting: Internal Medicine

## 2023-03-10 MED ORDER — TRAMADOL HCL 50 MG PO TABS: 50.0000 mg | ORAL_TABLET | Freq: Three times a day (TID) | ORAL | 5 refills | Status: DC | PRN

## 2023-03-10 NOTE — Telephone Encounter (Signed)
Prescription Request  03/10/2023  LOV: 01/07/2023  What is the name of the medication or equipment?  traMADol (ULTRAM) 50 MG table   Have you contacted your pharmacy to request a refill? No   Which pharmacy would you like this sent to?   [id number] - [place], [place] - [address] AT [address]NER OF [facility] [address] [place] [address] Phone: [phone] Fax: [phone]   Patient notified that their request is being sent to the clinical staff for review and that they should receive a response within 2 business days.   Please advise at Mobile [phone] (mobile)

## 2023-03-10 NOTE — Telephone Encounter (Signed)
Done erx 

## 2023-03-13 ENCOUNTER — Ambulatory Visit: Payer: Medicare HMO | Admitting: Internal Medicine

## 2023-03-13 ENCOUNTER — Ambulatory Visit (INDEPENDENT_AMBULATORY_CARE_PROVIDER_SITE_OTHER): Payer: Medicare HMO

## 2023-03-13 VITALS — BP 122/67 | HR 102 | Temp 97.7°F | Ht 66.0 in | Wt 208.8 lb

## 2023-03-13 DIAGNOSIS — Z0000 Encounter for general adult medical examination without abnormal findings: Secondary | ICD-10-CM

## 2023-03-13 NOTE — Patient Instructions (Signed)
Ms. Beel , Thank you for taking time to come for your Medicare Wellness Visit. I appreciate your ongoing commitment to your health goals. Please review the following plan we discussed and let me know if I can assist you in the future.   Referrals/Orders/Follow-Ups/Clinician Recommendations: No  This is a list of the screening recommended for you and due dates:  Health Maintenance  Topic Date Due   Eye exam for diabetics  01/30/2023   Hemoglobin A1C  07/09/2023   Yearly kidney health urinalysis for diabetes  09/10/2023   Complete foot exam   09/12/2023   Yearly kidney function blood test for diabetes  01/07/2024   Medicare Annual Wellness Visit  03/12/2024   DTaP/Tdap/Td vaccine (4 - Td or Tdap) 01/25/2029   Pneumonia Vaccine  Completed   Flu Shot  Completed   DEXA scan (bone density measurement)  Completed   COVID-19 Vaccine  Completed   Zoster (Shingles) Vaccine  Completed   HPV Vaccine  Aged Out   Hepatitis C Screening  Discontinued    Advanced directives: (Provided) Advance directive discussed with you today. I have provided a copy for you to complete at home and have notarized. Once this is complete, please bring a copy in to our office so we can scan it into your chart.   Next Medicare Annual Wellness Visit scheduled for next year: No  Preventive Care attachment FALL PREVENTION attachment

## 2023-03-13 NOTE — Progress Notes (Signed)
Subjective:   Kelsey Keller is a 82 y.o. female who presents for Medicare Annual (Subsequent) preventive examination.  Visit Complete: In person  Patient Medicare AWV questionnaire was completed by the patient on 03/08/2023; I have confirmed that all information answered by patient is correct and no changes since this date. Patient has join a fitness center and started [employer].  Also patient stated that she was not in any pain today.  Review of Systems     Cardiac Risk Factors include: advanced age (>25men, >62 women);diabetes mellitus;dyslipidemia;family history of premature cardiovascular disease;hypertension;obesity (BMI >30kg/m2);sedentary lifestyle     Objective:    Today's Vitals   03/13/23 0954  BP: 122/67  Pulse: (!) 102  Temp: 97.7 F (36.5 C)  TempSrc: Temporal  SpO2: 95%  Weight: 208 lb 12.8 oz (94.7 kg)  Height: 5\' 6"  (1.676 m)  PainSc: 0-No pain   Body mass index is 33.7 kg/m.     03/13/2023   10:15 AM 09/30/2022    3:00 PM 09/25/2022    8:49 AM 09/25/2022    8:43 AM 08/14/2022   11:11 AM 06/26/2022    6:45 AM 04/26/2021   10:34 AM  Advanced Directives  Does Patient Have a Medical Advance Directive? No Yes  Yes Yes Yes Yes  Type of Air cabin crew of [place];Living will Living will;Healthcare Power of [employer] Power of [place];Living will   Does patient want to make changes to medical advance directive?  No - Patient declined No - Patient declined No - Patient declined  No - Patient declined No - Patient declined  Copy of Healthcare Power of Attorney in Chart?   No - copy requested No - copy requested No - copy requested No - copy requested   Would patient like information on creating a medical advance directive? No - Patient declined          Current Medications (verified) Outpatient Encounter Medications as of 03/13/2023  Medication Sig   aspirin 81 MG EC tablet Take 81 mg by mouth  at bedtime.   atenolol (TENORMIN) 50 MG tablet Take 1 tablet (50 mg total) by mouth daily.   blood glucose meter kit and supplies KIT Dispense with patient  Insurance; Use up to four times daily as directed. E11.9   Blood Glucose Monitoring Suppl (ONE TOUCH ULTRA 2) w/Device KIT Use as directed up to 4 times per day E11.9   Cholecalciferol (VITAMIN D3) 50 MCG (2000 UT) TABS Take 2,000 Units by mouth daily.   Co-Enzyme Q-10 100 MG CAPS Take 100 mg by mouth daily.   cyanocobalamin 1000 MCG tablet Take 1,000 mcg by mouth daily.   dapagliflozin propanediol (FARXIGA) 10 MG TABS tablet Take 1 tablet (10 mg total) by mouth daily before breakfast.   fluticasone (FLONASE) 50 MCG/ACT nasal spray SPRAY 2 SPRAYS INTO EACH NOSTRIL EVERY DAY (Patient taking differently: Place 2 sprays into both nostrils daily as needed for allergies.)   glucose blood (ONETOUCH VERIO) test strip USE TO CHECK BLOOD SUGARS TWICE A DAY   hydrochlorothiazide (HYDRODIURIL) 25 MG tablet Take 1 tablet (25 mg total) by mouth daily.   Lancets MISC Use to check blood sugars twice a day   levothyroxine (SYNTHROID) 125 MCG tablet TAKE 1 TABLET BY MOUTH EVERY DAY BEFORE BREAKFAST   Magnesium 250 MG TABS Take 250 mg by mouth daily.   meloxicam (MOBIC) 7.5 MG tablet TAKE 1 TABLET BY MOUTH EVERY DAY AS  NEEDED FOR PAIN   Menthol, Topical Analgesic, (BENGAY EX) Apply 1 Application topically daily as needed (arthritis pain).   metFORMIN (GLUCOPHAGE) 1000 MG tablet Take 1 tablet (1,000 mg total) by mouth 2 (two) times daily with a meal.   omeprazole (PRILOSEC) 40 MG capsule Take 1 capsule (40 mg total) by mouth at bedtime.   pioglitazone (ACTOS) 30 MG tablet Take 1 tablet (30 mg total) by mouth daily.   Polyethyl Glycol-Propyl Glycol (SYSTANE OP) Place 1 drop into both eyes daily as needed (dry eyes/allergy season).   Red Yeast Rice Extract 600 MG CAPS Take 600 mg by mouth 2 (two) times daily.   traMADol (ULTRAM) 50 MG tablet Take 1 tablet (50 mg  total) by mouth every 8 (eight) hours as needed.   No facility-administered encounter medications on file as of 03/13/2023.    Allergies (verified) Patient has no known allergies.   History: Past Medical History:  Diagnosis Date   ALLERGIC RHINITIS 04/29/2007   Allergy    Basal cell carcinoma 03/24/2018   sclerosis- upper bridge of nose (MOHS)   BCC (basal cell carcinoma) 10/18/2019   right supra orbital region- Cx3 9fu   Cataract    both eyes   DIABETES MELLITUS, TYPE II 06/23/2007   type 2   Elevated liver enzymes 03/2017   Elevated tumor markers    GERD (gastroesophageal reflux disease)    Heart murmur    per patient   HIATAL HERNIA, HX OF 04/29/2007   History of hiatal hernia    HYPERLIPIDEMIA 04/29/2007   diet controlled on CoQ10   HYPERTENSION 04/29/2007   HYPOKALEMIA 05/02/2009   HYPOTHYROIDISM 04/29/2007   LOW BACK PAIN 04/29/2007   Neuromuscular disorder (HCC)    diabetic neuropathy in both legs   OSTEOARTHRITIS, KNEE, LEFT 06/23/2007   OSTEOPOROSIS NOS 05/12/2007   SPINAL STENOSIS, LUMBAR 04/29/2007   Past Surgical History:  Procedure Laterality Date   BACK SURGERY N/A 2014 x 2   T1 TO S 1    CATARACT EXTRACTION W/ INTRAOCULAR LENS  IMPLANT, BILATERAL Bilateral 05/2011   doing well   COLONOSCOPY WITH PROPOFOL N/A 04/10/2017   Procedure: COLONOSCOPY WITH PROPOFOL;  Surgeon: Rachael Fee, MD;  Location: [facility];  Service: Endoscopy;  Laterality: N/A;   COLONSCOPY  2011   JOINT REPLACEMENT     TOTAL KNEE ARTHROPLASTY  2006   right   TOTAL KNEE ARTHROPLASTY Left 2012   Family History  Problem Relation Age of Onset   Arthritis Mother    Hypertension Mother    Coronary artery disease Father    Arthritis Brother    Stroke Brother    Hyperlipidemia Brother    COPD Brother    Coronary artery disease Other        Female 1st degree relative   Diabetes Other        1st degree relative   Colon polyps Other    Colon cancer Neg Hx    Esophageal  cancer Neg Hx    Rectal cancer Neg Hx    Stomach cancer Neg Hx    Inflammatory bowel disease Neg Hx    Liver disease Neg Hx    Pancreatic cancer Neg Hx    Social History   Socioeconomic History   Marital status: Divorced    Spouse name: Not on file   Number of children: 1   Years of education: Not on file   Highest education level: GED or equivalent  Occupational History  Occupation: Engineer, 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  Tobacco Use   Smoking status: Never   Smokeless tobacco: Never  Vaping Use   Vaping status: Never Used  Substance and Sexual Activity   Alcohol use: Yes    Alcohol/week: 0.0 standard drinks of alcohol    Comment: One or two drinks in month   Drug use: No   Sexual activity: Not Currently  Other Topics Concern   Not on file  Social History Narrative   Not on file   Social Determinants of Health   Financial Resource Strain: Low Risk  (03/13/2023)   Overall Financial Resource Strain (CARDIA)    Difficulty of Paying Living Expenses: Not hard at all  Food Insecurity: No Food Insecurity (03/13/2023)   Hunger Vital Sign    Worried About Running Out of Food in the Last Year: Never true    Ran Out of Food in the Last Year: Never true  Transportation Needs: No Transportation Needs (03/13/2023)   PRAPARE - Administrator, Civil Service (Medical): No    Lack of Transportation (Non-Medical): No  Physical Activity: Inactive (03/13/2023)   Exercise Vital Sign    Days of Exercise per Week: 0 days    Minutes of Exercise per Session: 0 min  Stress: No Stress Concern Present (03/13/2023)   [employer] of Occupational Health - Occupational Stress Questionnaire    Feeling of Stress : Not at all  Social Connections: Moderately Integrated (03/13/2023)   Social Connection and Isolation Panel [NHANES]    Frequency of Communication with Friends and Family: More than three times a week    Frequency of Social Gatherings with Friends and Family: More than three  times a week    Attends Religious Services: More than 4 times per year    Active Member of [employer] or Organizations: Yes    Attends Engineer, structural: More than 4 times per year    Marital Status: Divorced    Tobacco Counseling Counseling given: Not Answered   Clinical Intake:  Pre-visit preparation completed: Yes  Pain : No/denies pain Pain Score: 0-No pain (pt stated)     BMI - recorded: 33.7 Nutritional Status: BMI > 30  Obese Nutritional Risks: None Diabetes: Yes CBG done?: No Did pt. bring in CBG monitor from home?: No  How often do you need to have someone help you when you read instructions, pamphlets, or other written materials from your doctor or pharmacy?: 2 - Rarely What is the last grade level you completed in school?: HSG  Interpreter Needed?: No  Information entered by :: Susie Cassette, LPN.   Activities of Daily Living    03/13/2023   10:00 AM 03/08/2023    8:51 PM  In your present state of health, do you have any difficulty performing the following activities:  Hearing? 0 0  Vision? 0 0  Difficulty concentrating or making decisions? 0 0  Walking or climbing stairs? 1 1  Dressing or bathing? 0 0  Doing errands, shopping? 1 1  Preparing Food and eating ? N N  Using the Toilet? N N  In the past six months, have you accidently leaked urine? Y Y  Do you have problems with loss of bowel control? Y Y  Managing your Medications? N N  Managing your Finances? N N  Housekeeping or managing your Housekeeping? Malvin Johns    Patient Care Team: Corwin Levins, MD as PCP - General Corky Crafts, MD as  PCP - Cardiology (Cardiology) [facility], [place] as Consulting Physician (Optometry) Colletta [place], RN as [facility] Care Management  Indicate any recent Medical Services you may have received from other than [facility] providers in the past year (date may be approximate).     Assessment:   This is a routine wellness  examination for [facility].  Hearing/Vision screen Hearing Screening - Comments:: Patient denied any hearing difficulty.   No hearing aids.  Vision Screening - Comments:: Patient does wear corrective lenses/contacts.  Annual eye exam done by: [facility]    Dietary issues and exercise activities discussed:     Goals Addressed   None   Depression Screen    03/13/2023   10:17 AM 01/07/2023    2:01 PM 09/12/2022    9:02 AM 02/26/2022   10:31 AM 09/06/2021   10:03 AM 04/26/2021   10:33 AM 11/09/2020   11:33 AM  PHQ 2/9 Scores  PHQ - 2 Score 0 0 0 0 0 0 0  PHQ- 9 Score 0  0        Fall Risk    03/13/2023   10:00 AM 03/08/2023    8:51 PM 01/07/2023    2:01 PM 10/04/2022   11:57 AM 09/12/2022    9:02 AM  Fall Risk   Falls in the past year? 0 0 0  0  Number falls in past yr: 0 0 0  0  Injury with Fall? 0  0  0  Risk for fall due to : No Fall Risks  No Fall Risks History of fall(s);Impaired mobility;Medication side effect No Fall Risks  Follow up Falls prevention discussed  Falls evaluation completed Education provided;Falls prevention discussed Falls evaluation completed;Education provided    MEDICARE RISK AT HOME: Medicare Risk at Home Any stairs in or around the home?: Yes If so, are there any without handrails?: No Home free of loose throw rugs in walkways, pet beds, electrical cords, etc?: Yes Adequate lighting in your home to reduce risk of falls?: Yes Life alert?: No Use of a cane, walker or w/c?: Yes Grab bars in the bathroom?: Yes Shower chair or bench in shower?: Yes Elevated toilet seat or a handicapped toilet?: Yes  TIMED UP AND GO:  Was the test performed?  Yes  Length of time to ambulate 10 feet: 12 sec Gait slow and steady with assistive device    Cognitive Function:    10/19/2014    3:20 PM  MMSE - Mini Mental State Exam  Orientation to time 5  Orientation to Place 5  Registration 3  Attention/ Calculation 3  Language- name 2 objects 2  Language-  repeat 1  Language- follow 3 step command 3  Language- read & follow direction 1  Write a sentence 1  Copy design 1        03/13/2023   10:00 AM 01/20/2020    8:56 AM  6CIT Screen  What Year? 0 points 0 points  What month? 0 points 0 points  What time? 0 points 3 points  Count back from 20 0 points 0 points  Months in reverse 0 points 0 points  Repeat phrase 0 points 0 points  Total Score 0 points 3 points    Immunizations Immunization History  Administered Date(s) Administered   Covid-19, Mrna,Vaccine(Spikevax)21yrs and older 02/24/2023   Fluad Quad(high Dose 65+) 03/26/2021   Influenza, High Dose Seasonal PF 04/15/2018, 03/20/2019, 04/10/2020, 03/26/2021, 03/26/2022   Influenza-Unspecified 05/05/2017, 04/15/2018  PFIZER(Purple Top)SARS-COV-2 Vaccination 07/30/2019, 08/20/2019, 04/17/2020   PNEUMOCOCCAL CONJUGATE-20 12/11/2021   Pfizer Covid-19 Vaccine Bivalent Booster 4yrs & up 05/16/2021   Pneumococcal Conjugate-13 04/15/2013   Pneumococcal Polysaccharide-23 04/27/2008   Rsv, Bivalent, Protein Subunit Rsvpref,pf Verdis Frederickson) 07/17/2022   Td 10/25/2008   Td (Adult), 2 Lf Tetanus Toxid, Preservative Free 10/25/2008   Tdap 01/26/2019   Unspecified SARS-COV-2 Vaccination 05/07/2022   Zoster Recombinant(Shingrix) 03/04/2022, 05/07/2022   Zoster, Live 10/20/2007    TDAP status: Up to date  Flu Vaccine status: Up to date  Pneumococcal vaccine status: Up to date  Covid-19 vaccine status: Completed vaccines  Qualifies for Shingles Vaccine? Yes   Zostavax completed Yes   Shingrix Completed?: Yes  Screening Tests Health Maintenance  Topic Date Due   OPHTHALMOLOGY EXAM  01/30/2023   HEMOGLOBIN A1C  07/09/2023   Diabetic kidney evaluation - Urine ACR  09/10/2023   FOOT EXAM  09/12/2023   Diabetic kidney evaluation - eGFR measurement  01/07/2024   Medicare Annual Wellness (AWV)  03/12/2024   DTaP/Tdap/Td (4 - Td or Tdap) 01/25/2029   Pneumonia Vaccine 16+ Years old   Completed   INFLUENZA VACCINE  Completed   DEXA SCAN  Completed   COVID-19 Vaccine  Completed   Zoster Vaccines- Shingrix  Completed   HPV VACCINES  Aged Out   Hepatitis C Screening  Discontinued    Health Maintenance  Health Maintenance Due  Topic Date Due   OPHTHALMOLOGY EXAM  01/30/2023    Colorectal cancer screening: No longer required.   Mammogram status: Completed 01/29/2023. Repeat every year  Bone Density status: Completed 01/14/2017. Results reflect: Bone density results: OSTEOPENIA. Repeat every 2-3 years.  Lung Cancer Screening: (Low Dose CT Chest recommended if Age 62-80 years, 20 pack-year currently smoking OR have quit w/in 15years.) does not qualify.   Lung Cancer Screening Referral: no  Additional Screening:  Hepatitis C Screening: does qualify; Completed 04/01/2017  Vision Screening: Recommended annual ophthalmology exams for early detection of glaucoma and other disorders of the eye. Is the patient up to date with their annual eye exam?  Yes  Who is the provider or what is the name of the office in which the patient attends annual eye exams? Heather [place], OD. If pt is not established with a provider, would they like to be referred to a provider to establish care? No .   Dental Screening: Recommended annual dental exams for proper oral hygiene  Diabetic Foot Exam: Diabetic Foot Exam: Completed 09/12/2022  Community Resource Referral / Chronic Care Management: CRR required this visit?  No   CCM required this visit?  No     Plan:     I have personally reviewed and noted the following in the patient's chart:   Medical and social history Use of alcohol, tobacco or illicit drugs  Current medications and supplements including opioid prescriptions. Patient is not currently taking opioid prescriptions. Functional ability and status Nutritional status Physical activity Advanced directives List of other physicians Hospitalizations, surgeries, and ER visits  in previous 12 months Vitals Screenings to include cognitive, depression, and falls Referrals and appointments  In addition, I have reviewed and discussed with patient certain preventive protocols, quality metrics, and best practice recommendations. A written personalized care plan for preventive services as well as general preventive health recommendations were provided to patient.     Mickeal Needy, LPN   0/45/[id number]   After Visit Summary: Printed and given to patient.  Nurse Notes:  Normal cognitive status  assessed by direct observation by this Nurse Health Advisor. No abnormalities found.  Patient just recently started E=Weight [employer] and joined the gym to get more physical exercise.

## 2023-03-14 ENCOUNTER — Ambulatory Visit: Payer: Self-pay

## 2023-03-14 NOTE — Patient Outreach (Signed)
  Care Coordination   Follow Up Visit Note   03/14/2023 Name: Kelsey Keller MRN: [id number] DOB: April 22, 1941  Kelsey Keller is a 82 y.o. year old female who sees Kelsey Levins, MD for primary care. I spoke with  Kelsey Keller by phone today.  What matters to the patients health and wellness today?  Kelsey Keller reports she no longer has pain in her back. She reports she continues to follow providers as scheduled recommended. She reports she has all medications and takes as prescribed. She denies any other care coordination, disease management or care coordination needs at this time.  Goals Addressed             This Visit's Progress    COMPLETED: Assist with health managment       Interventions Today    Flowsheet Row Most Recent Value  Chronic Disease   Chronic disease during today's visit Other, Diabetes, Hypertension (HTN)  [(spinal surgery 09/2022)]  General Interventions   General Interventions Discussed/Reviewed General Interventions Reviewed, Doctor Visits, [facility]  [Provided contact number to [employer] [phone], also reinforced othter active centers for Senior such as [facility] and [facility].]  Doctor Visits Discussed/Reviewed Doctor Visits Reviewed, PCP  PCP/Specialist Visits Compliance with follow-up visit  [reviewed upcoming scheduled appointments]  Exercise Interventions   Exercise Discussed/Reviewed Exercise Discussed, Physical Activity  [Reports going to the Gym now]  Physical Activity Discussed/Reviewed Physical Activity Discussed  [provided positive feedback re: patient's initiative to increase physical activity and exercise]  Education Interventions   Education Provided Provided Education  Provided Verbal Education On Other, Exercise  [advised to take medications as prescribed, attend provider appointments as scheduled/recommended, contact provider for health questions or concerns.]  Pharmacy Interventions   Pharmacy  Dicussed/Reviewed Pharmacy Topics Reviewed            SDOH assessments and interventions completed:  No  Care Coordination Interventions:  Yes, provided   Follow up plan: No further intervention required.   Encounter Outcome:  Pt. Visit Completed   Kelsey Sheriff, RN, MSN, BSN, CCM Care Management Coordinator [phone]

## 2023-03-14 NOTE — Patient Instructions (Signed)
Visit Information  Thank you for taking time to visit with me today. Please don't hesitate to contact me if I can be of assistance to you.   Following are the goals we discussed today:  Continue to take medications as prescribed. Continue to attend provider visits as scheduled Continue to eat healthy, lean meats, vegetables, fruits, avoid saturated and transfats Contact number to Senior resources of [place] [phone]; Other active centers for Seniors([facility] and [facility]).    If you are experiencing a Mental Health or Behavioral Health Crisis or need someone to talk to, please call the Suicide and Crisis Lifeline: 988 call the [place] National Suicide Prevention Lifeline: 506-562-4076 or TTY: 361-695-5123 TTY 604-432-1175) to talk to a trained counselor call 1-800-273-TALK (toll free, 24 hour hotline)  Kathyrn Sheriff, RN, MSN, BSN, CCM Care Management Coordinator [phone]

## 2023-03-20 DIAGNOSIS — G894 Chronic pain syndrome: Secondary | ICD-10-CM | POA: Diagnosis not present

## 2023-03-20 DIAGNOSIS — M48061 Spinal stenosis, lumbar region without neurogenic claudication: Secondary | ICD-10-CM | POA: Diagnosis not present

## 2023-03-20 DIAGNOSIS — G5701 Lesion of sciatic nerve, right lower limb: Secondary | ICD-10-CM | POA: Diagnosis not present

## 2023-03-20 DIAGNOSIS — M5136 Other intervertebral disc degeneration, lumbar region: Secondary | ICD-10-CM | POA: Diagnosis not present

## 2023-03-20 DIAGNOSIS — M47816 Spondylosis without myelopathy or radiculopathy, lumbar region: Secondary | ICD-10-CM | POA: Diagnosis not present

## 2023-04-16 DIAGNOSIS — M13832 Other specified arthritis, left wrist: Secondary | ICD-10-CM | POA: Diagnosis not present

## 2023-04-16 DIAGNOSIS — M13831 Other specified arthritis, right wrist: Secondary | ICD-10-CM | POA: Diagnosis not present

## 2023-04-16 DIAGNOSIS — G5601 Carpal tunnel syndrome, right upper limb: Secondary | ICD-10-CM | POA: Diagnosis not present

## 2023-05-05 ENCOUNTER — Telehealth: Payer: Self-pay | Admitting: Gastroenterology

## 2023-05-05 DIAGNOSIS — Z1330 Encounter for screening examination for mental health and behavioral disorders, unspecified: Secondary | ICD-10-CM | POA: Diagnosis not present

## 2023-05-05 DIAGNOSIS — N1831 Chronic kidney disease, stage 3a: Secondary | ICD-10-CM | POA: Diagnosis not present

## 2023-05-05 DIAGNOSIS — I700 Atherosclerosis of aorta: Secondary | ICD-10-CM | POA: Diagnosis not present

## 2023-05-05 DIAGNOSIS — G992 Myelopathy in diseases classified elsewhere: Secondary | ICD-10-CM | POA: Diagnosis not present

## 2023-05-05 DIAGNOSIS — E1165 Type 2 diabetes mellitus with hyperglycemia: Secondary | ICD-10-CM | POA: Diagnosis not present

## 2023-05-05 DIAGNOSIS — R29898 Other symptoms and signs involving the musculoskeletal system: Secondary | ICD-10-CM | POA: Diagnosis not present

## 2023-05-05 DIAGNOSIS — K754 Autoimmune hepatitis: Secondary | ICD-10-CM

## 2023-05-05 DIAGNOSIS — K7460 Unspecified cirrhosis of liver: Secondary | ICD-10-CM

## 2023-05-05 DIAGNOSIS — M4804 Spinal stenosis, thoracic region: Secondary | ICD-10-CM | POA: Diagnosis not present

## 2023-05-05 DIAGNOSIS — G5603 Carpal tunnel syndrome, bilateral upper limbs: Secondary | ICD-10-CM | POA: Diagnosis not present

## 2023-05-05 NOTE — Telephone Encounter (Addendum)
Patient called stated she was suppose to get a call to schedule 6 month follow up and has not heard from the office. Asking if she in fact needs to have another follow up because she usually has an order for testings at the hospital. Please advise.

## 2023-05-05 NOTE — Telephone Encounter (Signed)
Kelsey Keller, Placed 68-month HCC recall in the system for the patient. I have released the results to the patient on MyChart. Thanks. GM

## 2023-05-07 DIAGNOSIS — E119 Type 2 diabetes mellitus without complications: Secondary | ICD-10-CM | POA: Diagnosis not present

## 2023-05-07 NOTE — Telephone Encounter (Signed)
You have been scheduled for an abdominal ultrasound at [facility] (1st floor of hospital) on 05/12/23 at 9:30 am . Please arrive 30 minutes prior to your appointment for registration. Make certain not to have anything to eat or drink 6 hours prior to your appointment. Should you need to reschedule your appointment, please contact radiology at [phone]. This test typically takes about 30 minutes to perform.  Patient  has been scheduled for office follow-up appointment with Dr.Mansouraty on 05/30/23 at 10:30 am.   Left message for patient to return call.

## 2023-05-07 NOTE — Telephone Encounter (Signed)
Patient returned call. Patient informed of ultrasound appointment. Patient is requesting an appointment with a different provider. Advise patient that I will discuss this with Dr.Mansouraty and give her a call back tomorrow.

## 2023-05-08 NOTE — Telephone Encounter (Signed)
Returned call to patient. Patient requested to see Amy Esterwood PA-C/ Appt r/s to 06/16/23 at 1:30 pm.

## 2023-05-08 NOTE — Telephone Encounter (Signed)
Patient called to follow up on the message from yesterday regarding the provider switch.

## 2023-05-12 ENCOUNTER — Ambulatory Visit (HOSPITAL_COMMUNITY)
Admission: RE | Admit: 2023-05-12 | Discharge: 2023-05-12 | Disposition: A | Discharge status: Home / Self Care | Payer: Medicare HMO | Source: Ambulatory Visit | Attending: Gastroenterology | Admitting: Gastroenterology

## 2023-05-12 DIAGNOSIS — K754 Autoimmune hepatitis: Secondary | ICD-10-CM | POA: Insufficient documentation

## 2023-05-12 DIAGNOSIS — K8020 Calculus of gallbladder without cholecystitis without obstruction: Secondary | ICD-10-CM | POA: Diagnosis not present

## 2023-05-12 DIAGNOSIS — K7460 Unspecified cirrhosis of liver: Secondary | ICD-10-CM | POA: Insufficient documentation

## 2023-05-14 DIAGNOSIS — R200 Anesthesia of skin: Secondary | ICD-10-CM | POA: Diagnosis not present

## 2023-05-14 DIAGNOSIS — R29898 Other symptoms and signs involving the musculoskeletal system: Secondary | ICD-10-CM | POA: Diagnosis not present

## 2023-05-16 ENCOUNTER — Telehealth: Payer: Self-pay | Admitting: Internal Medicine

## 2023-05-16 NOTE — Telephone Encounter (Signed)
Pt called wanting to let the nurse she been having diarrhea for 2 days anything she drink or eat she has to go to the bathroom. Please advise

## 2023-05-20 DIAGNOSIS — G5601 Carpal tunnel syndrome, right upper limb: Secondary | ICD-10-CM | POA: Diagnosis not present

## 2023-05-29 ENCOUNTER — Other Ambulatory Visit: Payer: Self-pay | Admitting: Internal Medicine

## 2023-05-29 ENCOUNTER — Other Ambulatory Visit: Payer: Self-pay

## 2023-05-30 ENCOUNTER — Ambulatory Visit: Payer: Medicare HMO | Admitting: Gastroenterology

## 2023-06-05 DIAGNOSIS — M25641 Stiffness of right hand, not elsewhere classified: Secondary | ICD-10-CM | POA: Diagnosis not present

## 2023-06-16 ENCOUNTER — Other Ambulatory Visit (INDEPENDENT_AMBULATORY_CARE_PROVIDER_SITE_OTHER): Payer: Medicare HMO

## 2023-06-16 ENCOUNTER — Encounter: Payer: Self-pay | Admitting: Physician Assistant

## 2023-06-16 ENCOUNTER — Ambulatory Visit: Payer: Medicare HMO | Admitting: Physician Assistant

## 2023-06-16 VITALS — BP 130/80 | HR 76 | Ht 64.0 in | Wt 216.0 lb

## 2023-06-16 DIAGNOSIS — K8020 Calculus of gallbladder without cholecystitis without obstruction: Secondary | ICD-10-CM

## 2023-06-16 DIAGNOSIS — R195 Other fecal abnormalities: Secondary | ICD-10-CM

## 2023-06-16 DIAGNOSIS — K754 Autoimmune hepatitis: Secondary | ICD-10-CM

## 2023-06-16 DIAGNOSIS — R197 Diarrhea, unspecified: Secondary | ICD-10-CM

## 2023-06-16 DIAGNOSIS — K7460 Unspecified cirrhosis of liver: Secondary | ICD-10-CM

## 2023-06-16 LAB — PROTIME-INR
INR: 1.1 {ratio} — ABNORMAL HIGH (ref 0.8–1.0)
Prothrombin Time: 11.4 s (ref 9.6–13.1)

## 2023-06-16 NOTE — Progress Notes (Signed)
Attending Physician's Attestation   I have reviewed the chart.   I agree with the Advanced Practitioner's note, impression, and recommendations with any updates as below.    Emersyn Wyss Mansouraty, MD [facility]oscopy Office # [phone]  

## 2023-06-16 NOTE — Progress Notes (Signed)
Subjective:    Patient ID: Kelsey Keller, female    DOB: 07-Oct-1940, 82 y.o.   MRN: [id number]  HPI  Kelsey Keller is a pleasant 82 year old white female, established with Dr. Meridee Score, who comes in today for follow-up.  She was last seen in the office in March 2024.  She had previously been a patient of Dr. Christella Hartigan and carries diagnosis of autoimmune liver disease with cirrhosis, secondary to burned-out AIH. She last had EGD in 2018 which did not show any evidence of portal hypertension. She has not required steroid therapy. At the time of her last visit repeat EGD had been discussed, as was vaccination for hepatitis A/B.  Patient did not want to proceed with either of those recommendations at that time.  She was anticipating an upcoming back surgery and said that she had a lot of ongoing problems at that time.  She since did have upper abdominal ultrasound done in October 2024 that shows a 13 mm gallstone and increased hepatic echogenicity, no focal hepatic lesion noted.  Ultrasound did not definitely call cirrhosis. Her last labs were done in June 2024 with normal LFTs at that time CBC with hemoglobin 9.3/hematocrit 30/MCV 81/platelets 148.  Today she says she is concerned about developing IBS.  She says she had a 3-week period that she had persistent diarrhea and had to take as much as 3 Imodium and 1 day a couple of times.  She says she is always been very regular, usually has 2 bowel movements per day but over the past 8 or 9 months will occasionally have an episode of diarrhea. The bed episode of persistent diarrhea has resolved, at most she was having 5-6 bowel movements per day, also mentions that she been under a lot of stress and just recently lost her brother.  She is having some normal bowel movements but continues to tend towards looser stools at this time. She has not had any recent antibiotics, Imodium does help but had been worried about using this. Her most recent new medication  is [name] which was started about 6 months ago.  Appetite okay, weight stable.  She is still not sure whether she wants to proceed with an endoscopy, we further discussed that today   Review of Systems.Pertinent positive and negative review of systems were noted in the above HPI section.  All other review of systems was otherwise negative.   Outpatient Encounter Medications as of 06/16/2023  Medication Sig   aspirin 81 MG EC tablet Take 81 mg by mouth at bedtime.   atenolol (TENORMIN) 50 MG tablet Take 1 tablet (50 mg total) by mouth daily.   blood glucose meter kit and supplies KIT Dispense with patient  Insurance; Use up to four times daily as directed. E11.9   Blood Glucose Monitoring Suppl (ONE TOUCH ULTRA 2) w/Device KIT Use as directed up to 4 times per day E11.9   Cholecalciferol (VITAMIN D3) 50 MCG (2000 UT) TABS Take 2,000 Units by mouth daily.   Co-Enzyme Q-10 100 MG CAPS Take 100 mg by mouth daily.   cyanocobalamin 1000 MCG tablet Take 1,000 mcg by mouth daily.   dapagliflozin propanediol (FARXIGA) 10 MG TABS tablet Take 1 tablet (10 mg total) by mouth daily before breakfast.   fluticasone (FLONASE) 50 MCG/ACT nasal spray SPRAY 2 SPRAYS INTO EACH NOSTRIL EVERY DAY (Patient taking differently: Place 2 sprays into both nostrils daily as needed for allergies.)   glucose blood (ONETOUCH VERIO) test strip USE TO CHECK BLOOD  SUGARS TWICE A DAY   hydrochlorothiazide (HYDRODIURIL) 25 MG tablet Take 1 tablet (25 mg total) by mouth daily.   Lancets MISC Use to check blood sugars twice a day   levothyroxine (SYNTHROID) 125 MCG tablet TAKE 1 TABLET BY MOUTH EVERY DAY BEFORE BREAKFAST   Magnesium 250 MG TABS Take 250 mg by mouth daily.   meloxicam (MOBIC) 7.5 MG tablet TAKE 1 TABLET BY MOUTH EVERY DAY AS NEEDED FOR PAIN   Menthol, Topical Analgesic, (BENGAY EX) Apply 1 Application topically daily as needed (arthritis pain).   metFORMIN (GLUCOPHAGE) 1000 MG tablet Take 1 tablet (1,000 mg total)  by mouth 2 (two) times daily with a meal.   omeprazole (PRILOSEC) 40 MG capsule Take 1 capsule (40 mg total) by mouth at bedtime.   pioglitazone (ACTOS) 30 MG tablet Take 1 tablet (30 mg total) by mouth daily.   Polyethyl Glycol-Propyl Glycol (SYSTANE OP) Place 1 drop into both eyes daily as needed (dry eyes/allergy season).   Red Yeast Rice Extract 600 MG CAPS Take 600 mg by mouth 2 (two) times daily.   traMADol (ULTRAM) 50 MG tablet Take 1 tablet (50 mg total) by mouth every 8 (eight) hours as needed.   No facility-administered encounter medications on file as of 06/16/2023.   No Known Allergies Patient Active Problem List   Diagnosis Date Noted   Atrial fibrillation (HCC) 10/11/2022   Anemia 10/11/2022   Dysuria 10/11/2022   Vaginitis 10/11/2022   Myelopathy concurrent with and due to spinal stenosis of thoracic region [facility]) 09/30/2022   Autoimmune hepatitis (HCC) 09/21/2022   Hepatic cirrhosis (HCC) 09/21/2022   Aortic stenosis, mild 09/12/2022   Wheezing 09/03/2022   Left lumbar radiculopathy 03/13/2022   Inflammation of joint of both hands 11/08/2021   Right carpal tunnel syndrome 09/08/2021   Nocturnal leg cramps 03/10/2021   Aortic atherosclerosis (HCC) 03/06/2021   Asthma exacerbation 11/11/2020   B12 deficiency 08/20/2020   Chronic pain of right ankle 08/17/2020   Piriformis syndrome of both sides 10/06/2019   Rib pain on left side 09/21/2019   Irregular heart beat 07/29/2019   Viral labyrinthitis, bilateral 09/14/2018   Left-sided chest wall pain 08/07/2018   Weight loss 07/24/2018   CKD (chronic kidney disease) stage 3, GFR 30-59 ml/min (HCC) 01/20/2018   Carcinoembryonic antigen (CEA) elevation    Colon cancer screening    Generalized abdominal pain 03/04/2017   Hypercalcemia 01/17/2016   Vitamin D deficiency 01/17/2016   Right ankle pain 01/17/2016   Loosening of hardware in spine (HCC) 01/02/2016   Lumbar disc disease 01/17/2015   Urinary incontinence in  female 01/17/2015   Cough 07/26/2014   Skin lesion 01/21/2014   Obesity 09/18/2011   Preoperative examination 10/15/2010   Encounter for well adult exam with abnormal findings 10/15/2010   Diabetes (HCC) 06/23/2007   OSTEOARTHRITIS, KNEE, LEFT 06/23/2007   Osteoporosis 05/12/2007   Hypothyroidism 04/29/2007   Hyperlipidemia 04/29/2007   Essential hypertension 04/29/2007   Allergic rhinitis 04/29/2007   SPINAL STENOSIS, LUMBAR 04/29/2007   LOW BACK PAIN 04/29/2007   HIATAL HERNIA, HX OF 04/29/2007   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  Tobacco Use   Smoking status: Never   Smokeless  tobacco: Never  Vaping Use   Vaping status: Never Used  Substance and Sexual Activity   Alcohol use: Yes    Alcohol/week: 0.0 standard drinks of alcohol    Comment: One or two drinks in month   Drug use: No   Sexual activity: Not Currently  Other Topics Concern   Not on file  Social History Narrative   Not on file   Social Determinants of Health   Financial Resource Strain: Low Risk  (03/17/2023)   Received from [facility]   Overall Financial Resource Strain (CARDIA)    Difficulty of Paying Living Expenses: Not hard at all  Food Insecurity: No Food Insecurity (03/17/2023)   Received from [facility]   Hunger Vital Sign    Worried About Running Out of Food in the Last Year: Never true    Ran Out of Food in the Last Year: Never true  Transportation Needs: No Transportation Needs (03/17/2023)   Received from [facility] - Transportation    Lack of Transportation (Medical): No    Lack of Transportation (Non-Medical): No  Physical Activity: Insufficiently Active (03/17/2023)   Received from [facility]   Exercise Vital Sign    Days of  Exercise per Week: 2 days    Minutes of Exercise per Session: 40 min  Stress: No Stress Concern Present (03/17/2023)   Received from [facility] - Occupational Stress Questionnaire    Feeling of Stress : Not at all  Social Connections: Moderately Integrated (03/17/2023)   Received from [facility]   Social Network    How would you rate your social network (family, work, friends)?: Adequate participation with social networks  Intimate Partner Violence: Not At Risk (03/17/2023)   Received from [facility]   HITS    Over the last 12 months how often did your partner physically hurt you?: Never    Over the last 12 months how often did your partner insult you or talk down to you?: Never    Over the last 12 months how often did your partner threaten you with physical harm?: Never    Over the last 12 months how often did your partner scream or curse at you?: Never    Kelsey Keller's family history includes Arthritis in her brother and mother; COPD in her brother; Colon polyps in an other family member; Coronary artery disease in her father and another family member; Diabetes in an other family member; Hyperlipidemia in her brother; Hypertension in her mother; Stroke in her brother.      Objective:    Vitals:   06/16/23 1322  BP: 130/80  Pulse: 76    Physical Exam Well-developed well-nourished elderly white female in no acute distress.  Ambulates with a walker  height, Weight, 216 BMI 37.08  HEENT; nontraumatic normocephalic, EOMI, PE R LA, sclera anicteric. Oropharynx; not examined today Neck; supple, no JVD Cardiovascular; regular rate and rhythm with S1-S2, no murmur rub or gallop Pulmonary; Clear bilaterally Abdomen; soft, nontender, nondistended, no appreciable ascites/fluid wave no palpable mass or hepatosplenomegaly, bowel sounds are active Rectal; not done today Skin; benign exam, no jaundice rash or appreciable lesions Extremities; no  clubbing cyanosis or edema skin warm and dry Neuro/Psych; alert and oriented x4, grossly nonfocal mood and affect appropriate        Assessment & Plan:   #1  82 year old white female with cirrhosis felt secondary to burned-out autoimmune hepatitis Has not required steroid  therapy Normal LFTs Ultrasound October 2024 with increased echogenicity, no focal hepatic lesion  Last EGD tooth is an 18 no evidence of portal hypertension  #2 cholelithiasis noted on recent ultrasound asymptomatic #3.  Recent diarrhea, intermittent diarrhea over the past 8 to 9 months had a more persistent episode lasting for a couple of weeks recently which has since resolved and is now having intermittent loose stools. Etiology is not clear, doubt infectious by her description. She does not feel that she has lactose intolerance, no artificial sweeteners, only new medication Farxiga  #4 diabetes mellitus #5.  Hypertension #6.  Hypothyroidism #7.  History of thoracic and lumbar spinal stenosis status post back repair March 2024 #8.  Chronic kidney disease stage III #9.  Mild AS  Plan check pro time/INR, AFP She has labs scheduled with Dr. Annamary Rummage her PCP next week and would like to wait to have remainder of labs done until that time. We can calculate a more accurate MEL D after those labs result.  We rediscussed EGD today and indications.  She is asking to wait a couple of more months because she has had so many medical problems procedures etc. this year. I will plan to see her back in 3 to 4 months. For intermittent diarrhea will give her a trial of Benefiber 1 scoop daily in a glass of water, and she was given samples of IBgard today to try to p.o. twice daily. I have asked her to call back if she has recurrence of the more prolonged diarrhea and at that time would do stool studies etc., then could also consider testing for SIBO.  Kelsey Ohms Oswald Hillock PA-C 06/16/2023   Cc: Corwin Levins, MD

## 2023-06-16 NOTE — Patient Instructions (Signed)
Your provider has requested that you go to the basement level for lab work before leaving today. Press "B" on the elevator. The lab is located at the first door on the left as you exit the elevator.  Try Benefiber one dose daily in a glass of water.  Take over the counter IB Delene Ruffini - 2 twice a day.  Please call back in February to schedule an appointment with Amy  _______________________________________________________  If your blood pressure at your visit was 140/90 or greater, please contact your primary care physician to follow up on this.  _______________________________________________________  If you are age 1 or older, your body mass index should be between 23-30. Your Body mass index is 37.08 kg/m. If this is out of the aforementioned range listed, please consider follow up with your Primary Care Provider.  If you are age 61 or younger, your body mass index should be between 19-25. Your Body mass index is 37.08 kg/m. If this is out of the aformentioned range listed, please consider follow up with your Primary Care Provider.   ________________________________________________________  The [facility] providers would like to encourage you to use [facility] to communicate with providers for non-urgent requests or questions.  Due to long hold times on the telephone, sending your provider a message by [facility] may be a faster and more efficient way to get a response.  Please allow 48 business hours for a response.  Please remember that this is for non-urgent requests.  _______________________________________________________

## 2023-06-18 LAB — AFP TUMOR MARKER: AFP-Tumor Marker: 1.9 ng/mL

## 2023-06-24 ENCOUNTER — Telehealth: Payer: Self-pay | Admitting: Internal Medicine

## 2023-06-24 DIAGNOSIS — E538 Deficiency of other specified B group vitamins: Secondary | ICD-10-CM

## 2023-06-24 DIAGNOSIS — E559 Vitamin D deficiency, unspecified: Secondary | ICD-10-CM

## 2023-06-24 DIAGNOSIS — E1165 Type 2 diabetes mellitus with hyperglycemia: Secondary | ICD-10-CM

## 2023-06-24 NOTE — Telephone Encounter (Signed)
Pt called wanting to get her labs done prior to her appointment for her physical so she can go over the results during her office visit.

## 2023-06-24 NOTE — Telephone Encounter (Signed)
Ok labs are ordered 

## 2023-06-26 DIAGNOSIS — H401131 Primary open-angle glaucoma, bilateral, mild stage: Secondary | ICD-10-CM | POA: Diagnosis not present

## 2023-07-01 ENCOUNTER — Ambulatory Visit: Payer: Medicare HMO | Admitting: Internal Medicine

## 2023-07-01 ENCOUNTER — Encounter: Payer: Self-pay | Admitting: Internal Medicine

## 2023-07-01 VITALS — BP 120/72 | HR 80 | Temp 98.8°F | Ht 64.0 in | Wt 216.0 lb

## 2023-07-01 DIAGNOSIS — E538 Deficiency of other specified B group vitamins: Secondary | ICD-10-CM | POA: Diagnosis not present

## 2023-07-01 DIAGNOSIS — G629 Polyneuropathy, unspecified: Secondary | ICD-10-CM | POA: Insufficient documentation

## 2023-07-01 DIAGNOSIS — I10 Essential (primary) hypertension: Secondary | ICD-10-CM | POA: Diagnosis not present

## 2023-07-01 DIAGNOSIS — Z7984 Long term (current) use of oral hypoglycemic drugs: Secondary | ICD-10-CM

## 2023-07-01 DIAGNOSIS — N1831 Chronic kidney disease, stage 3a: Secondary | ICD-10-CM | POA: Diagnosis not present

## 2023-07-01 DIAGNOSIS — R600 Localized edema: Secondary | ICD-10-CM | POA: Insufficient documentation

## 2023-07-01 DIAGNOSIS — E1165 Type 2 diabetes mellitus with hyperglycemia: Secondary | ICD-10-CM | POA: Diagnosis not present

## 2023-07-01 DIAGNOSIS — E559 Vitamin D deficiency, unspecified: Secondary | ICD-10-CM | POA: Diagnosis not present

## 2023-07-01 LAB — BASIC METABOLIC PANEL
BUN: 34 mg/dL — ABNORMAL HIGH (ref 6–23)
CO2: 28 meq/L (ref 19–32)
Calcium: 9.8 mg/dL (ref 8.4–10.5)
Chloride: 104 meq/L (ref 96–112)
Creatinine, Ser: 1.08 mg/dL (ref 0.40–1.20)
GFR: 48.46 mL/min — ABNORMAL LOW (ref >60.00)
Glucose, Bld: 158 mg/dL — ABNORMAL HIGH (ref 70–99)
Potassium: 4.5 meq/L (ref 3.5–5.1)
Sodium: 140 meq/L (ref 135–145)

## 2023-07-01 LAB — HEPATIC FUNCTION PANEL
ALT: 17 U/L (ref 0–35)
AST: 29 U/L (ref 0–37)
Albumin: 3.9 g/dL (ref 3.5–5.2)
Alkaline Phosphatase: 73 U/L (ref 39–117)
Bilirubin, Direct: 0.3 mg/dL (ref 0.0–0.3)
Total Bilirubin: 1 mg/dL (ref 0.2–1.2)
Total Protein: 6.4 g/dL (ref 6.0–8.3)

## 2023-07-01 LAB — CBC WITH DIFFERENTIAL/PLATELET
Basophils Absolute: 0 10*3/uL (ref 0.0–0.1)
Basophils Relative: 1 % (ref 0.0–3.0)
Eosinophils Absolute: 0.1 10*3/uL (ref 0.0–0.7)
Eosinophils Relative: 3.4 % (ref 0.0–5.0)
HCT: 30.5 % — ABNORMAL LOW (ref 36.0–46.0)
Hemoglobin: 9.6 g/dL — ABNORMAL LOW (ref 12.0–15.0)
Lymphocytes Relative: 20.7 % (ref 12.0–46.0)
Lymphs Abs: 0.9 10*3/uL (ref 0.7–4.0)
MCHC: 31.4 g/dL (ref 30.0–36.0)
MCV: 82.7 fL (ref 78.0–100.0)
Monocytes Absolute: 0.4 10*3/uL (ref 0.1–1.0)
Monocytes Relative: 9.6 % (ref 3.0–12.0)
Neutro Abs: 2.7 10*3/uL (ref 1.4–7.7)
Neutrophils Relative %: 65.3 % (ref 43.0–77.0)
Platelets: 166 10*3/uL (ref 150.0–400.0)
RBC: 3.69 Mil/uL — ABNORMAL LOW (ref 3.87–5.11)
RDW: 18.3 % — ABNORMAL HIGH (ref 11.5–15.5)
WBC: 4.2 10*3/uL (ref 4.0–10.5)

## 2023-07-01 LAB — MICROALBUMIN / CREATININE URINE RATIO
Creatinine,U: 72.9 mg/dL
Microalb Creat Ratio: 1.9 mg/g (ref 0.0–30.0)
Microalb, Ur: 1.4 mg/dL (ref 0.0–1.9)

## 2023-07-01 LAB — URINALYSIS, ROUTINE W REFLEX MICROSCOPIC
Bilirubin Urine: NEGATIVE
Hgb urine dipstick: NEGATIVE
Ketones, ur: NEGATIVE
Leukocytes,Ua: NEGATIVE
Nitrite: NEGATIVE
Specific Gravity, Urine: 1.02 (ref 1.000–1.030)
Total Protein, Urine: NEGATIVE
Urine Glucose: >=1000 — AB
Urobilinogen, UA: 0.2 (ref 0.0–1.0)
pH: 6 (ref 5.0–8.0)

## 2023-07-01 LAB — VITAMIN D 25 HYDROXY (VIT D DEFICIENCY, FRACTURES): VITD: 38.53 ng/mL (ref 30.00–100.00)

## 2023-07-01 LAB — LIPID PANEL
Cholesterol: 145 mg/dL (ref 0–200)
HDL: 63.2 mg/dL (ref >39.00)
LDL Cholesterol: 61 mg/dL (ref 0–99)
NonHDL: 81.54
Total CHOL/HDL Ratio: 2
Triglycerides: 103 mg/dL (ref 0.0–149.0)
VLDL: 20.6 mg/dL (ref 0.0–40.0)

## 2023-07-01 LAB — VITAMIN B12: Vitamin B-12: 635 pg/mL (ref 211–911)

## 2023-07-01 LAB — HEMOGLOBIN A1C: Hgb A1c MFr Bld: 7.8 % — ABNORMAL HIGH (ref 4.6–6.5)

## 2023-07-01 LAB — TSH: TSH: 2.8 u[IU]/mL (ref 0.35–5.50)

## 2023-07-01 MED ORDER — DAPAGLIFLOZIN PROPANEDIOL 10 MG PO TABS: 10.0000 mg | ORAL_TABLET | Freq: Every day | ORAL | 3 refills | Status: DC

## 2023-07-01 NOTE — Progress Notes (Unsigned)
Patient ID: Kelsey Keller, female   DOB: 1941/04/27, 82 y.o.   MRN: [id number]        Chief Complaint: follow up HTN, and DM, peripheral edema, low vit d and b12, ckd3a       HPI:  Kelsey Keller is a 82 y.o. female here overall doing ok;  Pt denies chest pain, increased sob or doe, wheezing, orthopnea, PND, , palpitations, dizziness or syncope, but has persistent leg swelling, and reluctant to take the lasix 20 mg prn..   Pt denies polydipsia, polyuria, or new focal neuro s/s.    Pt denies fever, wt loss, night sweats, loss of appetite, or other constitutional symptoms       Grieving with brother recently passed with copd, heart disease, and CLL.  Saw optho recently, has new early mild glaucoma left eye only.  Did have back surgury which no further pain, but still has bilateral leg pain, saw neurology with peripheral neuropathy both proximal damage related to lumbar dz and other more peripheral.  Walks with walker.  No major falls recent. Wt Readings from Last 3 Encounters:  07/01/23 216 lb (98 kg)  06/16/23 216 lb (98 kg)  03/13/23 208 lb 12.8 oz (94.7 kg)   BP Readings from Last 3 Encounters:  07/01/23 120/72  06/16/23 130/80  03/13/23 122/67         Past Medical History:  Diagnosis Date   ALLERGIC RHINITIS 04/29/2007   Allergy    Basal cell carcinoma 03/24/2018   sclerosis- upper bridge of nose (MOHS)   BCC (basal cell carcinoma) 10/18/2019   right supra orbital region- Cx3 [id number]   Cataract    both eyes   DIABETES MELLITUS, TYPE II 06/23/2007   type 2   Elevated liver enzymes 03/2017   Elevated tumor markers    GERD (gastroesophageal reflux disease)    Heart murmur    per patient   HIATAL HERNIA, HX OF 04/29/2007   History of hiatal hernia    HYPERLIPIDEMIA 04/29/2007   diet controlled on CoQ10   HYPERTENSION 04/29/2007   HYPOKALEMIA 05/02/2009   HYPOTHYROIDISM 04/29/2007   LOW BACK PAIN 04/29/2007   Neuromuscular disorder (HCC)    diabetic neuropathy in both legs    OSTEOARTHRITIS, KNEE, LEFT 06/23/2007   OSTEOPOROSIS NOS 05/12/2007   SPINAL STENOSIS, LUMBAR 04/29/2007   Past Surgical History:  Procedure Laterality Date   BACK SURGERY N/A 2014 x 2   T1 TO S 1    BACK SURGERY     CATARACT EXTRACTION W/ INTRAOCULAR LENS  IMPLANT, BILATERAL Bilateral 05/2011   doing well   COLONOSCOPY WITH PROPOFOL N/A 04/10/2017   Procedure: COLONOSCOPY WITH PROPOFOL;  Surgeon: Rachael Fee, MD;  Location: [facility];  Service: Endoscopy;  Laterality: N/A;   COLONSCOPY  2011   JOINT REPLACEMENT     TOTAL KNEE ARTHROPLASTY  2006   right   TOTAL KNEE ARTHROPLASTY Left 2012    reports that she has never smoked. She has never used smokeless tobacco. She reports current alcohol use. She reports that she does not use drugs. family history includes Arthritis in her brother and mother; COPD in her brother; Colon polyps in an other family member; Coronary artery disease in her father and another family member; Diabetes in an other family member; Hyperlipidemia in her brother; Hypertension in her mother; Stroke in her brother. No Known Allergies Current Outpatient Medications on File Prior to Visit  Medication Sig Dispense Refill   aspirin 81  MG EC tablet Take 81 mg by mouth at bedtime.     atenolol (TENORMIN) 50 MG tablet Take 1 tablet (50 mg total) by mouth daily. 90 tablet 3   blood glucose meter kit and supplies KIT Dispense with patient  Insurance; Use up to four times daily as directed. E11.9 1 each 0   Blood Glucose Monitoring Suppl (ONE TOUCH ULTRA 2) w/Device KIT Use as directed up to 4 times per day E11.9 1 each 0   Cholecalciferol (VITAMIN D3) 50 MCG (2000 UT) TABS Take 2,000 Units by mouth daily.     Co-Enzyme Q-10 100 MG CAPS Take 100 mg by mouth daily.     cyanocobalamin 1000 MCG tablet Take 1,000 mcg by mouth daily.     fluticasone (FLONASE) 50 MCG/ACT nasal spray SPRAY 2 SPRAYS INTO EACH NOSTRIL EVERY DAY (Patient taking differently: Place 2 sprays  into both nostrils daily as needed for allergies.) 48 mL 3   glucose blood (ONETOUCH VERIO) test strip USE TO CHECK BLOOD SUGARS TWICE A DAY 200 strip 0   hydrochlorothiazide (HYDRODIURIL) 25 MG tablet Take 1 tablet (25 mg total) by mouth daily. 90 tablet 3   Lancets MISC Use to check blood sugars twice a day 200 each 0   latanoprost (XALATAN) 0.005 % ophthalmic solution SMARTSIG:1 Drop(s) In Eye(s) Every Evening     levothyroxine (SYNTHROID) 125 MCG tablet TAKE 1 TABLET BY MOUTH EVERY DAY BEFORE BREAKFAST 90 tablet 3   Magnesium 250 MG TABS Take 250 mg by mouth daily.     meloxicam (MOBIC) 7.5 MG tablet TAKE 1 TABLET BY MOUTH EVERY DAY AS NEEDED FOR PAIN 90 tablet 1   Menthol, Topical Analgesic, (BENGAY EX) Apply 1 Application topically daily as needed (arthritis pain).     metFORMIN (GLUCOPHAGE) 1000 MG tablet Take 1 tablet (1,000 mg total) by mouth 2 (two) times daily with a meal. 180 tablet 3   omeprazole (PRILOSEC) 40 MG capsule Take 1 capsule (40 mg total) by mouth at bedtime. 90 capsule 3   pioglitazone (ACTOS) 30 MG tablet Take 1 tablet (30 mg total) by mouth daily. 90 tablet 3   Polyethyl Glycol-Propyl Glycol (SYSTANE OP) Place 1 drop into both eyes daily as needed (dry eyes/allergy season).     Red Yeast Rice Extract 600 MG CAPS Take 600 mg by mouth 2 (two) times daily.     traMADol (ULTRAM) 50 MG tablet Take 1 tablet (50 mg total) by mouth every 8 (eight) hours as needed. 90 tablet 5   No current facility-administered medications on file prior to visit.        ROS:  All others reviewed and negative.  Objective        PE:  BP 120/72 (BP Location: Right Arm, Patient Position: Sitting, Cuff Size: Normal)   Pulse 80   Temp 98.8 F (37.1 C) (Oral)   Ht 5\' 4"  (1.626 m)   Wt 216 lb (98 kg)   SpO2 98%   BMI 37.08 kg/m                 Constitutional: Pt appears in NAD               HENT: Head: NCAT.                Right Ear: External ear normal.                 Left Ear: External  ear normal.  Eyes: . Pupils are equal, round, and reactive to light. Conjunctivae and EOM are normal               Nose: without d/c or deformity               Neck: Neck supple. Gross normal ROM               Cardiovascular: Normal rate and regular rhythm.                 Pulmonary/Chest: Effort normal and breath sounds without rales or wheezing.                Abd:  Soft, NT, ND, + BS, no organomegaly               Neurological: Pt is alert. At baseline orientation, motor grossly intact               Skin: Skin is warm. No rashes, no other new lesions, LE edema - 1-2 + edema bilateral               Psychiatric: Pt behavior is normal without agitation   Micro: none  Cardiac tracings I have personally interpreted today:  none  Pertinent Radiological findings (summarize): none   Lab Results  Component Value Date   WBC 4.2 07/01/2023   HGB 9.6 (L) 07/01/2023   HCT 30.5 (L) 07/01/2023   PLT 166.0 07/01/2023   GLUCOSE 158 (H) 07/01/2023   CHOL 145 07/01/2023   TRIG 103.0 07/01/2023   HDL 63.20 07/01/2023   LDLDIRECT 107.5 07/21/2013   LDLCALC 61 07/01/2023   ALT 17 07/01/2023   AST 29 07/01/2023   NA 140 07/01/2023   K 4.5 07/01/2023   CL 104 07/01/2023   CREATININE 1.08 07/01/2023   BUN 34 (H) 07/01/2023   CO2 28 07/01/2023   TSH 2.80 07/01/2023   INR 1.1 (H) 06/16/2023   HGBA1C 7.8 (H) 07/01/2023   MICROALBUR 1.4 07/01/2023   Assessment/Plan:  Kelsey Keller is a 82 y.o. White or Caucasian [1] female with  has a past medical history of ALLERGIC RHINITIS (04/29/2007), Allergy, Basal cell carcinoma (03/24/2018), BCC (basal cell carcinoma) (10/18/2019), Cataract, DIABETES MELLITUS, TYPE II (06/23/2007), Elevated liver enzymes (03/2017), Elevated tumor markers, GERD (gastroesophageal reflux disease), Heart murmur, HIATAL HERNIA, HX OF (04/29/2007), History of hiatal hernia, HYPERLIPIDEMIA (04/29/2007), HYPERTENSION (04/29/2007), HYPOKALEMIA (05/02/2009),  HYPOTHYROIDISM (04/29/2007), LOW BACK PAIN (04/29/2007), Neuromuscular disorder (HCC), OSTEOARTHRITIS, KNEE, LEFT (06/23/2007), OSTEOPOROSIS NOS (05/12/2007), and SPINAL STENOSIS, LUMBAR (04/29/2007).  Peripheral edema Ok to restart lasix 20 mg prn -   Diabetes Lab Results  Component Value Date   HGBA1C 7.8 (H) 07/01/2023   uncontrolled, pt to continue current medical treatment metformin 1000 bid, actos 30 every day but increased farxiga 10 qd   Essential hypertension BP Readings from Last 3 Encounters:  07/01/23 120/72  06/16/23 130/80  03/13/23 122/67   Stable, pt to continue medical treatment tenormin 50 every day, hct 25 qd   Vitamin D deficiency Last vitamin D Lab Results  Component Value Date   VD25OH 38.53 07/01/2023   Low, to start oral replacement   CKD (chronic kidney disease) stage 3, GFR 30-59 ml/min (HCC) Lab Results  Component Value Date   CREATININE 1.08 07/01/2023   Stable overall, cont to avoid nephrotoxins   B12 deficiency Lab Results  Component Value Date   VITAMINB12 635 07/01/2023   Stable, cont oral replacement - b12 1000 mcg  qd  Followup: Return in about 6 months (around 12/30/2023).  Oliver Barre, MD 07/02/2023 9:03 PM [facility]

## 2023-07-01 NOTE — Patient Instructions (Signed)
Ok to increase the farxiga to 10 mg per day  Ok to take the lasix 20 mg as needed for increased leg swelling  Please continue all other medications as before, and refills have been done if requested.  Please have the pharmacy call with any other refills you may need.  Please continue your efforts at being more active, low cholesterol diet, and weight control.  Please keep your appointments with your specialists as you may have planned  Please go to the LAB at the blood drawing area for the tests to be done  You will be contacted by phone if any changes need to be made immediately.  Otherwise, you will receive a letter about your results with an explanation, but please check with MyChart first.  Please make an Appointment to return in 6 months, or sooner if needed, also with Lab Appointment for testing done 3-5 days before at the FIRST FLOOR Lab (so this is for TWO appointments - please see the scheduling desk as you leave)

## 2023-07-01 NOTE — Assessment & Plan Note (Signed)
Ok to restart lasix 20 mg prn -

## 2023-07-02 ENCOUNTER — Encounter: Payer: Self-pay | Admitting: Internal Medicine

## 2023-07-02 NOTE — Assessment & Plan Note (Signed)
BP Readings from Last 3 Encounters:  07/01/23 120/72  06/16/23 130/80  03/13/23 122/67   Stable, pt to continue medical treatment tenormin 50 every day, hct 25 qd

## 2023-07-02 NOTE — Assessment & Plan Note (Signed)
Lab Results  Component Value Date   VITAMINB12 635 07/01/2023   Stable, cont oral replacement - b12 1000 mcg qd

## 2023-07-02 NOTE — Assessment & Plan Note (Signed)
Lab Results  Component Value Date   CREATININE 1.08 07/01/2023   Stable overall, cont to avoid nephrotoxins

## 2023-07-02 NOTE — Assessment & Plan Note (Signed)
Last vitamin D Lab Results  Component Value Date   VD25OH 38.53 07/01/2023   Low, to start oral replacement

## 2023-07-02 NOTE — Addendum Note (Signed)
Addended by: Corwin Levins on: 07/02/2023 09:04 PM   Modules accepted: Level of Service

## 2023-07-02 NOTE — Assessment & Plan Note (Signed)
Lab Results  Component Value Date   HGBA1C 7.8 (H) 07/01/2023   uncontrolled, pt to continue current medical treatment metformin 1000 bid, actos 30 every day but increased farxiga 10 qd

## 2023-07-19 DIAGNOSIS — J069 Acute upper respiratory infection, unspecified: Secondary | ICD-10-CM | POA: Diagnosis not present

## 2023-07-19 DIAGNOSIS — R0981 Nasal congestion: Secondary | ICD-10-CM | POA: Diagnosis not present

## 2023-07-19 DIAGNOSIS — R051 Acute cough: Secondary | ICD-10-CM | POA: Diagnosis not present

## 2023-07-24 ENCOUNTER — Ambulatory Visit: Payer: Self-pay | Admitting: Internal Medicine

## 2023-07-24 NOTE — Telephone Encounter (Signed)
 Copied from CRM [id number]. Topic: Clinical - Pink Word Triage >> Jul 24, 2023 11:17 AM Leotis ORN wrote: Reason for Triage: PT was seen  at urgent care 07/18/23 for cold symptom, Rx for tessalon and norel ad. Appointment was offered, no availabilities, pt is requesting a callback [phone]   Chief Complaint: Diarrhea Symptoms: Loose Stools and Abdominal Cramping Frequency: Acute Pertinent Negatives: Patient denies Fever, consistent pain Disposition: [] ED /[] Urgent Care (no appt availability in office) / [x] Appointment(In office/virtual)/ []  [facility]/ [] Home Care/ [] Refused Recommended Disposition /[]  Mobile Bus/ []  Follow-up with PCP Additional Notes: MN is a 83 year old female being triaged today for acute diarrhea that started last night. The patient has not been treated recently with any antibiotics and reports having two loose stools today. The patient denies any other gastrointestinal symptoms. Scheduled for virtual visit today.    Reason for Disposition  [1] Mild diarrhea (e.g., 1-3 or more stools than normal in past 24 hours) without known cause AND [2] present >  7 days  Answer Assessment - Initial Assessment Questions 1. DIARRHEA SEVERITY: How bad is the diarrhea? How many more stools have you had in the past 24 hours than normal?    - NO DIARRHEA (SCALE 0)   - MILD (SCALE 1-3): Few loose or mushy BMs; increase of 1-3 stools over normal daily number of stools; mild increase in ostomy output.   -  MODERATE (SCALE 4-7): Increase of 4-6 stools daily over normal; moderate increase in ostomy output.   -  SEVERE (SCALE 8-10; OR WORST POSSIBLE): Increase of 7 or more stools daily over normal; moderate increase in ostomy output; incontinence.     2  2. ONSET: When did the diarrhea begin?      Yesterday  3. BM CONSISTENCY: How loose or watery is the diarrhea?      Loose  4. VOMITING: Are you also vomiting? If Yes, ask: How many times in the past 24  hours?      No  5. ABDOMEN PAIN: Are you having any abdomen pain? If Yes, ask: What does it feel like? (e.g., crampy, dull, intermittent, constant)      Cramping  6. ABDOMEN PAIN SEVERITY: If present, ask: How bad is the pain?  (e.g., Scale 1-10; mild, moderate, or severe)   - MILD (1-3): doesn't interfere with normal activities, abdomen soft and not tender to touch    - MODERATE (4-7): interferes with normal activities or awakens from sleep, abdomen tender to touch    - SEVERE (8-10): excruciating pain, doubled over, unable to do any normal activities       No consistent pain ] 7. ORAL INTAKE: If vomiting, Have you been able to drink liquids? How much liquids have you had in the past 24 hours?     Able to keep liquids down  8. HYDRATION: Any signs of dehydration? (e.g., dry mouth [not just dry lips], too weak to stand, dizziness, new weight loss) When did you last urinate?     No  9. EXPOSURE: Have you traveled to a foreign country recently? Have you been exposed to anyone with diarrhea? Could you have eaten any food that was spoiled?     No  10. ANTIBIOTIC USE: Are you taking antibiotics now or have you taken antibiotics in the past 2 months?       No . 11. OTHER SYMPTOMS: Do you have any other symptoms? (e.g., fever, blood in stool)  Mucus in stool  Protocols used: [facility]

## 2023-07-25 ENCOUNTER — Telehealth (INDEPENDENT_AMBULATORY_CARE_PROVIDER_SITE_OTHER): Payer: HMO | Admitting: Nurse Practitioner

## 2023-07-25 DIAGNOSIS — K529 Noninfective gastroenteritis and colitis, unspecified: Secondary | ICD-10-CM

## 2023-07-25 NOTE — Assessment & Plan Note (Signed)
 Acute Seems to be resolving Overall history seems consistent with viral infection that is now improving.  Patient encouraged to focus on hydrating with frequent small sips of fluids and to start incorporating bland foods slowly back into her diet to see how she tolerates them.  As long as the diarrhea does not return and she is not experiencing vomiting patient does not need to be seen in person at this time, she was told if symptoms worsen she needs to be seen in the emergency department or urgent care.  She reports her understanding.

## 2023-07-25 NOTE — Progress Notes (Addendum)
   Established Patient Office Visit  An audio/visual tele-health visit was completed today for this patient. I connected with  Kelsey Keller on 07/30/23 utilizing audio/visual technology and verified that I am speaking with the correct person using two identifiers. The patient was located at their home, and I was located at home during the encounter. I discussed the limitations of evaluation and management by telemedicine. The patient expressed understanding and agreed to proceed.     Subjective   Patient ID: Kelsey Keller, female    DOB: February 21, 1941  Age: 83 y.o. MRN: [id number]  Chief Complaint  Patient presents with   Diarrhea    Patient arrives today for virtual visit for the above.  More than half of the visit was using audio and visual technology, there was a issue with the audio technology about three quarters of the way through the visit so we ended up closing out the audiovisual component and ended up completing the visit via audio only telephone.  She reports that about 1 week ago she was seen in urgent care for upper respiratory infections symptoms.  It was identified that this was likely viral in origin and she was treated with cough suppressant via [employer].  She reports overall cough is much improved and is basically resolved.  However about 2 days ago she started experiencing diarrhea.  Diarrhea has subsided since yesterday has been about 24 hours since it has stopped.  She still experiences a little bit of abdominal pain and bloating but overall is feeling much better.  She did take Imodium.  She almost canceled today's visit but decided to keep it as a precaution.    Review of Systems  Respiratory:  Negative for cough.   Gastrointestinal:  Negative for abdominal pain, nausea and vomiting.      Objective:     There were no vitals taken for this visit.   Physical Exam Comprehensive physical exam not completed today as office visit was conducted remotely.   Appears well over video, no signs of acute distress.  Patient was alert and oriented, and appeared to have appropriate judgment.   No results found for any visits on 07/25/23.    The ASCVD Risk score (Arnett DK, et al., 2019) failed to calculate for the following reasons:   The 2019 ASCVD risk score is only valid for ages 45 to 87    Assessment & Plan:   Problem List Items Addressed This Visit       Digestive   Gastroenteritis - Primary   Acute Seems to be resolving Overall history seems consistent with viral infection that is now improving.  Patient encouraged to focus on hydrating with frequent small sips of fluids and to start incorporating bland foods slowly back into her diet to see how she tolerates them.  As long as the diarrhea does not return and she is not experiencing vomiting patient does not need to be seen in person at this time, she was told if symptoms worsen she needs to be seen in the emergency department or urgent care.  She reports her understanding.       No follow-ups on file.    Lauraine FORBES Pereyra, NP

## 2023-08-04 DIAGNOSIS — H401131 Primary open-angle glaucoma, bilateral, mild stage: Secondary | ICD-10-CM | POA: Diagnosis not present

## 2023-08-15 ENCOUNTER — Ambulatory Visit: Payer: Medicare HMO | Admitting: Physician Assistant

## 2023-08-18 ENCOUNTER — Ambulatory Visit: Payer: HMO | Admitting: Physician Assistant

## 2023-08-26 DIAGNOSIS — M48061 Spinal stenosis, lumbar region without neurogenic claudication: Secondary | ICD-10-CM | POA: Diagnosis not present

## 2023-08-26 DIAGNOSIS — G894 Chronic pain syndrome: Secondary | ICD-10-CM | POA: Diagnosis not present

## 2023-08-26 DIAGNOSIS — M47816 Spondylosis without myelopathy or radiculopathy, lumbar region: Secondary | ICD-10-CM | POA: Diagnosis not present

## 2023-08-26 DIAGNOSIS — G5703 Lesion of sciatic nerve, bilateral lower limbs: Secondary | ICD-10-CM | POA: Diagnosis not present

## 2023-08-26 DIAGNOSIS — Z1330 Encounter for screening examination for mental health and behavioral disorders, unspecified: Secondary | ICD-10-CM | POA: Diagnosis not present

## 2023-09-18 DIAGNOSIS — M792 Neuralgia and neuritis, unspecified: Secondary | ICD-10-CM | POA: Diagnosis not present

## 2023-09-18 DIAGNOSIS — E1142 Type 2 diabetes mellitus with diabetic polyneuropathy: Secondary | ICD-10-CM | POA: Diagnosis not present

## 2023-09-18 DIAGNOSIS — M19071 Primary osteoarthritis, right ankle and foot: Secondary | ICD-10-CM | POA: Diagnosis not present

## 2023-09-18 DIAGNOSIS — M21962 Unspecified acquired deformity of left lower leg: Secondary | ICD-10-CM | POA: Diagnosis not present

## 2023-09-18 DIAGNOSIS — L84 Corns and callosities: Secondary | ICD-10-CM | POA: Diagnosis not present

## 2023-09-18 DIAGNOSIS — I739 Peripheral vascular disease, unspecified: Secondary | ICD-10-CM | POA: Diagnosis not present

## 2023-09-18 DIAGNOSIS — M19072 Primary osteoarthritis, left ankle and foot: Secondary | ICD-10-CM | POA: Diagnosis not present

## 2023-09-18 DIAGNOSIS — B351 Tinea unguium: Secondary | ICD-10-CM | POA: Diagnosis not present

## 2023-09-18 DIAGNOSIS — M21961 Unspecified acquired deformity of right lower leg: Secondary | ICD-10-CM | POA: Diagnosis not present

## 2023-09-18 DIAGNOSIS — L603 Nail dystrophy: Secondary | ICD-10-CM | POA: Diagnosis not present

## 2023-09-18 NOTE — Progress Notes (Signed)
 Chief Complaint:follow-up Primary GI Doctor:Dr. Mansouraty  HPI:  Kelsey Keller is a pleasant 83 year old white female, established with Dr. Meridee Score, who comes in today for follow-up.  She was last seen in the office in December 2024 with Amy, PA.   She had previously been a patient of Dr. Christella Hartigan and carries diagnosis of autoimmune liver disease with cirrhosis, secondary to burned-out AIH. She last had EGD in 2018 which did not show any evidence of portal hypertension. She has not required steroid therapy. At the time of her last visit repeat EGD had been discussed, as was vaccination for hepatitis A/B.  Patient did not want to proceed with either of those recommendations at that time. She was anticipating an upcoming back surgery and said that she had a lot of ongoing problems at that time.   She did have upper abdominal ultrasound (October 2024) that shows a 13 mm gallstone and increased hepatic echogenicity, no focal hepatic lesion noted.  Ultrasound did not definitely call cirrhosis. Her last labs were done in June 2024 with normal LFTs at that time CBC with hemoglobin 9.3/hematocrit 30/MCV 81/platelets 148.  06/16/23 Last visit with Amy, PA she discussed concerns with developing IBS with persistent diarrhea. Intermittent diarrhea over the past 8 to 9 months had a more persistent episode lasting for a couple of weeks recently which has since resolved and is now having intermittent loose stools. Etiology is not clear, doubt infectious by her description. Plan :Recommended EGD, declined. For intermittent diarrhea will give her a trial of Benefiber 1 scoop daily in a glass of water, and she was given samples of IBgard today to try to p.o. twice daily.   Interval History     Patient presents today for follow-up for altered bowel habits. She reports she started Metamucil 1 gummy in the morning and evening per Amy's recommendations at last appointment. She states since starting the fiber her bowels  have been "less loose" and she has had more constipation. So she started eating grapes so she goes more often. No blood in stool. 06/16/23 labs: INR 1.1/PT 11.4, AFP 1.9.   Patient also has history of GERD and asymptomatic on Omeprazole 40 mg po daily.  Patient denies dysphagia. Patient denies nausea, vomiting, or weight loss. She is working with [employer] and lost 50lbs.   Wt Readings from Last 3 Encounters:  09/23/23 208 lb 12.8 oz (94.7 kg)  07/01/23 216 lb (98 kg)  06/16/23 216 lb (98 kg)    Past Medical History:  Diagnosis Date   ALLERGIC RHINITIS 04/29/2007   Allergy    Basal cell carcinoma 03/24/2018   sclerosis- upper bridge of nose (MOHS)   BCC (basal cell carcinoma) 10/18/2019   right supra orbital region- Cx3 37fu   Cataract    both eyes   DIABETES MELLITUS, TYPE II 06/23/2007   type 2   Elevated liver enzymes 03/2017   Elevated tumor markers    GERD (gastroesophageal reflux disease)    Heart murmur    per patient   HIATAL HERNIA, HX OF 04/29/2007   History of hiatal hernia    HYPERLIPIDEMIA 04/29/2007   diet controlled on CoQ10   HYPERTENSION 04/29/2007   HYPOKALEMIA 05/02/2009   HYPOTHYROIDISM 04/29/2007   LOW BACK PAIN 04/29/2007   Neuromuscular disorder (HCC)    diabetic neuropathy in both legs   OSTEOARTHRITIS, KNEE, LEFT 06/23/2007   OSTEOPOROSIS NOS 05/12/2007   SPINAL STENOSIS, LUMBAR 04/29/2007    Past Surgical History:  Procedure Laterality Date  BACK SURGERY N/A 2014 x 2   T1 TO S 1    BACK SURGERY     CATARACT EXTRACTION W/ INTRAOCULAR LENS  IMPLANT, BILATERAL Bilateral 05/2011   doing well   COLONOSCOPY WITH PROPOFOL N/A 04/10/2017   Procedure: COLONOSCOPY WITH PROPOFOL;  Surgeon: Rachael Fee, MD;  Location: [facility];  Service: Endoscopy;  Laterality: N/A;   COLONSCOPY  2011   JOINT REPLACEMENT     TOTAL KNEE ARTHROPLASTY  2006   right   TOTAL KNEE ARTHROPLASTY Left 2012    Current Outpatient Medications  Medication Sig  Dispense Refill   aspirin 81 MG EC tablet Take 81 mg by mouth at bedtime.     atenolol (TENORMIN) 50 MG tablet Take 1 tablet (50 mg total) by mouth daily. 90 tablet 3   blood glucose meter kit and supplies KIT Dispense with patient  Insurance; Use up to four times daily as directed. E11.9 1 each 0   Blood Glucose Monitoring Suppl (ONE TOUCH ULTRA 2) w/Device KIT Use as directed up to 4 times per day E11.9 1 each 0   Cholecalciferol (VITAMIN D3) 50 MCG (2000 UT) TABS Take 2,000 Units by mouth daily.     Co-Enzyme Q-10 100 MG CAPS Take 100 mg by mouth daily.     cyanocobalamin 1000 MCG tablet Take 1,000 mcg by mouth daily.     dapagliflozin propanediol (FARXIGA) 10 MG TABS tablet Take 1 tablet (10 mg total) by mouth daily before breakfast. 90 tablet 3   fluticasone (FLONASE) 50 MCG/ACT nasal spray SPRAY 2 SPRAYS INTO EACH NOSTRIL EVERY DAY (Patient taking differently: Place 2 sprays into both nostrils daily as needed for allergies.) 48 mL 3   glucose blood (ONETOUCH VERIO) test strip USE TO CHECK BLOOD SUGARS TWICE A DAY 200 strip 0   hydrochlorothiazide (HYDRODIURIL) 25 MG tablet Take 1 tablet (25 mg total) by mouth daily. 90 tablet 3   Lancets MISC Use to check blood sugars twice a day 200 each 0   latanoprost (XALATAN) 0.005 % ophthalmic solution SMARTSIG:1 Drop(s) In Eye(s) Every Evening     levothyroxine (SYNTHROID) 125 MCG tablet TAKE 1 TABLET BY MOUTH EVERY DAY BEFORE BREAKFAST 90 tablet 3   Magnesium 250 MG TABS Take 250 mg by mouth daily.     meloxicam (MOBIC) 7.5 MG tablet TAKE 1 TABLET BY MOUTH EVERY DAY AS NEEDED FOR PAIN 90 tablet 1   Menthol, Topical Analgesic, (BENGAY EX) Apply 1 Application topically daily as needed (arthritis pain).     metFORMIN (GLUCOPHAGE) 1000 MG tablet Take 1 tablet (1,000 mg total) by mouth 2 (two) times daily with a meal. 180 tablet 3   omeprazole (PRILOSEC) 40 MG capsule Take 1 capsule (40 mg total) by mouth at bedtime. 90 capsule 3   pioglitazone (ACTOS)  30 MG tablet Take 1 tablet (30 mg total) by mouth daily. 90 tablet 3   Red Yeast Rice Extract 600 MG CAPS Take 600 mg by mouth 2 (two) times daily.     traMADol (ULTRAM) 50 MG tablet Take 1 tablet (50 mg total) by mouth every 8 (eight) hours as needed. 90 tablet 5   No current facility-administered medications for this visit.   Allergies as of 09/23/2023   (No Known Allergies)   Family History  Problem Relation Age of Onset   Arthritis Mother    Hypertension Mother    Coronary artery disease Father    Arthritis Brother    Stroke Brother  Hyperlipidemia Brother    COPD Brother    Coronary artery disease Other        Female 1st degree relative   Diabetes Other        1st degree relative   Colon polyps Other    Colon cancer Neg Hx    Esophageal cancer Neg Hx    Rectal cancer Neg Hx    Stomach cancer Neg Hx    Inflammatory bowel disease Neg Hx    Liver disease Neg Hx    Pancreatic cancer Neg Hx    Review of Systems:    Constitutional: No weight loss, fever, chills, weakness or fatigue HEENT: Eyes: No change in vision               Ears, Nose, Throat:  No change in hearing or congestion Skin: No rash or itching Cardiovascular: No chest pain, chest pressure or palpitations   Respiratory: No SOB or cough Gastrointestinal: See HPI and otherwise negative Genitourinary: No dysuria or change in urinary frequency Neurological: No headache, dizziness or syncope Musculoskeletal: No new muscle or joint pain Hematologic: No bleeding or bruising Psychiatric: No history of depression or anxiety   Physical Exam:  Vital signs: BP 112/66   Pulse (!) 105   Ht 5\' 4"  (1.626 m)   Wt 208 lb 12.8 oz (94.7 kg)   BMI 35.84 kg/m   Constitutional:   Pleasant  female appears to be in NAD, Well developed, Well nourished, alert and cooperative Eyes:   PEERL, EOMI. No icterus. Conjunctiva pink. Throat: Oral cavity and pharynx without inflammation, swelling or lesion.  Respiratory: Respirations  even and unlabored. Lungs clear to auscultation bilaterally.   No wheezes, crackles, or rhonchi.  Cardiovascular: Normal S1, S2. Regular rate and rhythm. No peripheral edema, cyanosis or pallor.  Gastrointestinal:  Soft, nondistended, nontender. No rebound or guarding. Normal bowel sounds. No appreciable masses or hepatomegaly. Rectal:  Not performed.  Msk:  Symmetrical without gross deformities. Without edema, no deformity or joint abnormality.  Neurologic:  Alert and  oriented x4;  grossly normal neurologically.  Skin:   Dry and intact without significant lesions or rashes. Psychiatric: Oriented to person, place and time. Demonstrates good judgement and reason without abnormal affect or behaviors.  RELEVANT LABS AND IMAGING: CBC    Latest Ref Rng & Units 07/01/2023   10:43 AM 01/07/2023    3:19 PM 10/08/2022    1:52 PM  CBC  WBC 4.0 - 10.5 K/uL 4.2  4.4  5.7   Hemoglobin 12.0 - 15.0 g/dL 9.6  9.3  9.6   Hematocrit 36.0 - 46.0 % 30.5  30.0  29.7   Platelets 150.0 - 400.0 K/uL 166.0  148.0  216.0      CMP     Latest Ref Rng & Units 07/01/2023   10:43 AM 01/07/2023    3:19 PM 10/08/2022    1:52 PM  CMP  Glucose 70 - 99 mg/dL 409  811  914   BUN 6 - 23 mg/dL 34  24  26   Creatinine 0.40 - 1.20 mg/dL 7.82  9.56  2.13   Sodium 135 - 145 mEq/L 140  139  136   Potassium 3.5 - 5.1 mEq/L 4.5  4.5  4.3   Chloride 96 - 112 mEq/L 104  103  98   CO2 19 - 32 mEq/L 28  29  31    Calcium 8.4 - 10.5 mg/dL 9.8  08.6  9.7   Total Protein 6.0 -  8.3 g/dL 6.4  6.6    Total Bilirubin 0.2 - 1.2 mg/dL 1.0  1.1    Alkaline Phos 39 - 117 U/L 73  90    AST 0 - 37 U/L 29  22    ALT 0 - 35 U/L 17  13       Lab Results  Component Value Date   TSH 2.80 07/01/2023  07/01/23 labs show- AST 29, ALT 17, Alk phosp 73, Total bili 1.0 08/14/22 echo- Left ventricular ejection fraction, by estimation, is 60 to 65%.  Computed MELD 3.0 unavailable. One or more values for this score either were not found within the  given timeframe or did not fit some other criterion. Computed MELD-Na unavailable. One or more values for this score either were not found within the given timeframe or did not fit some other criterion.    Assessment: Encounter Diagnoses  Name Primary?   Cirrhosis of liver without ascites, unspecified hepatic cirrhosis type (HCC) Yes   Autoimmune hepatitis (HCC)    Diarrhea, unspecified type    Gastroesophageal reflux disease with esophagitis without hemorrhage    83 year old white female with cirrhosis felt secondary to burned-out autoimmune hepatitis. Has not required steroid therapy. Normal LFTs in December. Ultrasound October 2024 with increased echogenicity, no focal hepatic lesion. There is evidence of a small gallstone but without evidence of the gallstone causing blockages at this time. Dr. Meridee Score would like to do [facility] screening with a CT Liver protocol since she has not had this done previously and every 2-3 years, it is good to get better cross-sectional imaging to make sure we are not missing something. Back in 2018 when she had her last endoscopic evaluation there was no evidence of portal hypertension. She is overdue for screening and agrees to endoscopic procedure today. She has never been vaccinated against hepatitis A or hepatitis B and declines again today. I cannot calculate her MELD score. She is not interested today in getting additional lab tests, she is due for labs with her PCP in June. 06/16/23 labs: INR 1.1/PT 11.4, AFP 1.9.    For the altered bowel habits she seems to be happy with adding the fiber supplementation, we discussed trying once a day to see if this lessens the constipation.  Plan: - Recommend Metamucil once daily to see if that helps lessen constipation -She is also overdue for portal hypertension evaluation with endoscopy, agrees to proceed. Schedule EGD with Dr. Meridee Score. The risks and benefits of EGD with possible biopsies and esophageal dilation were  discussed with the patient who agrees to proceed.  -66-month HCC screening with a CT-Liver triple phase protocol per Dr. Meridee Score - Recommend she be vaccinated against hepatitis A or hepatitis B, declines today. -Proceed with labwork with PCP as scheduled in June.  Thank you for the courtesy of this consult. Please call me with any questions or concerns.  -Follow-up Dr. Meridee Score in 6 months  Mackson Botz, [facility] 09/23/2023, 1:38 PM  Cc: Corwin Levins, MD

## 2023-09-23 ENCOUNTER — Encounter: Payer: Self-pay | Admitting: Gastroenterology

## 2023-09-23 ENCOUNTER — Ambulatory Visit: Payer: HMO | Admitting: Gastroenterology

## 2023-09-23 VITALS — BP 112/66 | HR 105 | Ht 64.0 in | Wt 208.8 lb

## 2023-09-23 DIAGNOSIS — K754 Autoimmune hepatitis: Secondary | ICD-10-CM | POA: Diagnosis not present

## 2023-09-23 DIAGNOSIS — K2100 Gastro-esophageal reflux disease with esophagitis, without bleeding: Secondary | ICD-10-CM | POA: Diagnosis not present

## 2023-09-23 DIAGNOSIS — K7460 Unspecified cirrhosis of liver: Secondary | ICD-10-CM | POA: Diagnosis not present

## 2023-09-23 DIAGNOSIS — R197 Diarrhea, unspecified: Secondary | ICD-10-CM | POA: Diagnosis not present

## 2023-09-23 NOTE — Progress Notes (Signed)
 Attending Physician's Attestation   I have reviewed the chart.   I agree with the Advanced Practitioner's note, impression, and recommendations with any updates as below. Glad to hear that she is improving with the addition of the fiber supplementation.  Agree that upper endoscopy is reasonable to ensure evidence of overall amount of portal hypertension.  Can hold on colonoscopy for now unless something else develops or changes.   Corliss Parish, MD [facility] Advanced Endoscopy Office # [phone]

## 2023-09-23 NOTE — Patient Instructions (Addendum)
 Recommend decreasing Metamucil to 1 gummy in the morning  You have been scheduled for an abdominal CT scan at [facility] (1st floor of hospital) on 09/30/23 at 3:00pm. Please arrive 30 minutes prior to your appointment for registration. Make certain not to have anything to eat or drink 6 hours prior to your appointment. Should you need to reschedule your appointment, please contact radiology at [phone]. This test typically takes about 30 minutes to perform.  You have been scheduled for an endoscopy. Please follow written instructions given to you at your visit today.  If you use inhalers (even only as needed), please bring them with you on the day of your procedure.  If you take any of the following medications, they will need to be adjusted prior to your procedure:   DO NOT TAKE 7 DAYS PRIOR TO TEST- Trulicity (dulaglutide) Ozempic, Wegovy (semaglutide) Mounjaro (tirzepatide) Bydureon Bcise (exanatide extended release)  DO NOT TAKE 1 DAY PRIOR TO YOUR TEST Rybelsus (semaglutide) Adlyxin (lixisenatide) Victoza (liraglutide) Byetta (exanatide) ___________________________________________________________________________  _______________________________________________________  If your blood pressure at your visit was 140/90 or greater, please contact your primary care physician to follow up on this.  _______________________________________________________  If you are age 74 or older, your body mass index should be between 23-30. Your Body mass index is 35.84 kg/m. If this is out of the aforementioned range listed, please consider follow up with your Primary Care Provider.  If you are age 68 or younger, your body mass index should be between 19-25. Your Body mass index is 35.84 kg/m. If this is out of the aformentioned range listed, please consider follow up with your Primary Care Provider.   ________________________________________________________  The [facility]  providers would like to encourage you to use [facility] to communicate with providers for non-urgent requests or questions.  Due to long hold times on the telephone, sending your provider a message by [facility] may be a faster and more efficient way to get a response.  Please allow 48 business hours for a response.  Please remember that this is for non-urgent requests.  _______________________________________________________ Thank you for trusting me with your gastrointestinal care!   Margarite Gouge May, NP

## 2023-09-27 DIAGNOSIS — L603 Nail dystrophy: Secondary | ICD-10-CM | POA: Diagnosis not present

## 2023-09-30 ENCOUNTER — Ambulatory Visit (HOSPITAL_COMMUNITY)
Admission: RE | Admit: 2023-09-30 | Discharge: 2023-09-30 | Disposition: A | Discharge status: Home / Self Care | Source: Ambulatory Visit | Attending: Gastroenterology | Admitting: Gastroenterology

## 2023-09-30 DIAGNOSIS — K8020 Calculus of gallbladder without cholecystitis without obstruction: Secondary | ICD-10-CM | POA: Diagnosis not present

## 2023-09-30 DIAGNOSIS — K2100 Gastro-esophageal reflux disease with esophagitis, without bleeding: Secondary | ICD-10-CM | POA: Diagnosis not present

## 2023-09-30 DIAGNOSIS — K754 Autoimmune hepatitis: Secondary | ICD-10-CM | POA: Insufficient documentation

## 2023-09-30 DIAGNOSIS — R197 Diarrhea, unspecified: Secondary | ICD-10-CM | POA: Insufficient documentation

## 2023-09-30 DIAGNOSIS — K7689 Other specified diseases of liver: Secondary | ICD-10-CM | POA: Diagnosis not present

## 2023-09-30 DIAGNOSIS — K7460 Unspecified cirrhosis of liver: Secondary | ICD-10-CM | POA: Diagnosis not present

## 2023-09-30 MED ORDER — IOHEXOL 300 MG/ML  SOLN
100.0000 mL | Freq: Once | INTRAMUSCULAR | Status: AC | PRN
  Administered 2023-09-30: 100 mL via INTRAVENOUS

## 2023-10-07 DIAGNOSIS — G5703 Lesion of sciatic nerve, bilateral lower limbs: Secondary | ICD-10-CM | POA: Diagnosis not present

## 2023-10-24 ENCOUNTER — Encounter: Payer: Self-pay | Admitting: Gastroenterology

## 2023-10-28 DIAGNOSIS — M19072 Primary osteoarthritis, left ankle and foot: Secondary | ICD-10-CM | POA: Diagnosis not present

## 2023-10-28 DIAGNOSIS — M792 Neuralgia and neuritis, unspecified: Secondary | ICD-10-CM | POA: Diagnosis not present

## 2023-10-28 DIAGNOSIS — E1142 Type 2 diabetes mellitus with diabetic polyneuropathy: Secondary | ICD-10-CM | POA: Diagnosis not present

## 2023-10-28 DIAGNOSIS — M21962 Unspecified acquired deformity of left lower leg: Secondary | ICD-10-CM | POA: Diagnosis not present

## 2023-10-28 DIAGNOSIS — M19071 Primary osteoarthritis, right ankle and foot: Secondary | ICD-10-CM | POA: Diagnosis not present

## 2023-10-28 DIAGNOSIS — B351 Tinea unguium: Secondary | ICD-10-CM | POA: Diagnosis not present

## 2023-10-28 DIAGNOSIS — L603 Nail dystrophy: Secondary | ICD-10-CM | POA: Diagnosis not present

## 2023-10-28 DIAGNOSIS — M21961 Unspecified acquired deformity of right lower leg: Secondary | ICD-10-CM | POA: Diagnosis not present

## 2023-10-28 DIAGNOSIS — I739 Peripheral vascular disease, unspecified: Secondary | ICD-10-CM | POA: Diagnosis not present

## 2023-10-28 DIAGNOSIS — L84 Corns and callosities: Secondary | ICD-10-CM | POA: Diagnosis not present

## 2023-10-29 ENCOUNTER — Telehealth: Payer: Self-pay

## 2023-10-29 NOTE — Telephone Encounter (Signed)
-----   Message from Devin Foerster May sent at 10/29/2023  1:17 PM EDT ----- Regarding: FW: imaging Good afternoon-  Can you put in imaging and labwork as requested by Dr. Brice Campi please.  Deanna, NP-C ----- Message ----- From: Normie Becton., MD Sent: 10/29/2023   6:24 AM EDT To: Devin Foerster May, NP Subject: RE: imaging                                    DJM, Thanks for this update. If not already done, lets make sure that we have a liver ultrasound for [facility] screening in 6 months recall and have her do a CBC/CMP/INR/AFP around that same time. You can forward that to my team if needed or if you have already done it just let me know. Thanks. GM ----- Message ----- From: May, Deanna J, NP Sent: 10/27/2023   8:11 AM EDT To: Normie Becton., MD Subject: imaging                                        Radames Buff, NP ----- Message ----- From: Dannis Dy, Rad Results In Sent: 10/15/2023  10:56 PM EDT To: Devin Foerster May, NP

## 2023-10-29 NOTE — Telephone Encounter (Signed)
 Will contact pt as requested when she is due for follow up.

## 2023-11-04 ENCOUNTER — Encounter: Payer: Self-pay | Admitting: Gastroenterology

## 2023-11-04 ENCOUNTER — Ambulatory Visit: Admitting: Gastroenterology

## 2023-11-04 VITALS — BP 119/72 | HR 81 | Temp 97.2°F | Resp 14 | Ht 64.0 in | Wt 208.0 lb

## 2023-11-04 DIAGNOSIS — K222 Esophageal obstruction: Secondary | ICD-10-CM | POA: Diagnosis not present

## 2023-11-04 DIAGNOSIS — K3189 Other diseases of stomach and duodenum: Secondary | ICD-10-CM | POA: Diagnosis not present

## 2023-11-04 DIAGNOSIS — I10 Essential (primary) hypertension: Secondary | ICD-10-CM | POA: Diagnosis not present

## 2023-11-04 DIAGNOSIS — Q399 Congenital malformation of esophagus, unspecified: Secondary | ICD-10-CM

## 2023-11-04 DIAGNOSIS — K449 Diaphragmatic hernia without obstruction or gangrene: Secondary | ICD-10-CM | POA: Diagnosis not present

## 2023-11-04 DIAGNOSIS — K2950 Unspecified chronic gastritis without bleeding: Secondary | ICD-10-CM

## 2023-11-04 DIAGNOSIS — K7460 Unspecified cirrhosis of liver: Secondary | ICD-10-CM | POA: Diagnosis not present

## 2023-11-04 DIAGNOSIS — K319 Disease of stomach and duodenum, unspecified: Secondary | ICD-10-CM

## 2023-11-04 DIAGNOSIS — E039 Hypothyroidism, unspecified: Secondary | ICD-10-CM | POA: Diagnosis not present

## 2023-11-04 DIAGNOSIS — Q402 Other specified congenital malformations of stomach: Secondary | ICD-10-CM

## 2023-11-04 DIAGNOSIS — E119 Type 2 diabetes mellitus without complications: Secondary | ICD-10-CM | POA: Diagnosis not present

## 2023-11-04 MED ORDER — OMEPRAZOLE 40 MG PO CPDR: 40.0000 mg | DELAYED_RELEASE_CAPSULE | Freq: Two times a day (BID) | ORAL | 5 refills | Status: DC

## 2023-11-04 MED ORDER — SODIUM CHLORIDE 0.9 % IV SOLN: 500.0000 mL | Freq: Once | INTRAVENOUS | Status: DC

## 2023-11-04 NOTE — Progress Notes (Signed)
 Pt's states no medical or surgical changes since previsit or office visit.

## 2023-11-04 NOTE — Progress Notes (Signed)
 A/o x 3, VSS, gd SR's, pleased with anesthesia, report to RN

## 2023-11-04 NOTE — Progress Notes (Signed)
 GASTROENTEROLOGY PROCEDURE H&P NOTE   Primary Care Physician: Roslyn Coombe, MD  HPI: Kelsey Keller is a 83 y.o. female who presents for EGD for evaluation/screening varices.  Past Medical History:  Diagnosis Date   ALLERGIC RHINITIS 04/29/2007   Allergy    Basal cell carcinoma 03/24/2018   sclerosis- upper bridge of nose (MOHS)   BCC (basal cell carcinoma) 10/18/2019   right supra orbital region- Cx3 81fu   Cataract    both eyes   DIABETES MELLITUS, TYPE II 06/23/2007   type 2   Elevated liver enzymes 03/2017   Elevated tumor markers    GERD (gastroesophageal reflux disease)    Heart murmur    per patient   HIATAL HERNIA, HX OF 04/29/2007   History of hiatal hernia    HYPERLIPIDEMIA 04/29/2007   diet controlled on CoQ10   HYPERTENSION 04/29/2007   HYPOKALEMIA 05/02/2009   HYPOTHYROIDISM 04/29/2007   LOW BACK PAIN 04/29/2007   Neuromuscular disorder (HCC)    diabetic neuropathy in both legs   OSTEOARTHRITIS, KNEE, LEFT 06/23/2007   OSTEOPOROSIS NOS 05/12/2007   SPINAL STENOSIS, LUMBAR 04/29/2007   Past Surgical History:  Procedure Laterality Date   BACK SURGERY N/A 2014 x 2   T1 TO S 1    BACK SURGERY     CATARACT EXTRACTION W/ INTRAOCULAR LENS  IMPLANT, BILATERAL Bilateral 05/2011   doing well   COLONOSCOPY WITH PROPOFOL  N/A 04/10/2017   Procedure: COLONOSCOPY WITH PROPOFOL ;  Surgeon: Janel Medford, MD;  Location: [facility];  Service: Endoscopy;  Laterality: N/A;   COLONSCOPY  2011   JOINT REPLACEMENT     TOTAL KNEE ARTHROPLASTY  2006   right   TOTAL KNEE ARTHROPLASTY Left 2012   Current Outpatient Medications  Medication Sig Dispense Refill   aspirin 81 MG EC tablet Take 81 mg by mouth at bedtime.     atenolol  (TENORMIN ) 50 MG tablet Take 1 tablet (50 mg total) by mouth daily. 90 tablet 3   blood glucose meter kit and supplies KIT Dispense with patient  Insurance; Use up to four times daily as directed. E11.9 1 each 0   Blood Glucose Monitoring  Suppl (ONE TOUCH ULTRA 2) w/Device KIT Use as directed up to 4 times per day E11.9 1 each 0   Cholecalciferol (VITAMIN D3) 50 MCG (2000 UT) TABS Take 2,000 Units by mouth daily.     Co-Enzyme Q-10 100 MG CAPS Take 100 mg by mouth daily.     cyanocobalamin  1000 MCG tablet Take 1,000 mcg by mouth daily.     dapagliflozin  propanediol (FARXIGA ) 10 MG TABS tablet Take 1 tablet (10 mg total) by mouth daily before breakfast. 90 tablet 3   fluticasone  (FLONASE ) 50 MCG/ACT nasal spray SPRAY 2 SPRAYS INTO EACH NOSTRIL EVERY DAY (Patient taking differently: Place 2 sprays into both nostrils daily as needed for allergies.) 48 mL 3   glucose blood (ONETOUCH VERIO) test strip USE TO CHECK BLOOD SUGARS TWICE A DAY 200 strip 0   hydrochlorothiazide  (HYDRODIURIL ) 25 MG tablet Take 1 tablet (25 mg total) by mouth daily. 90 tablet 3   Lancets MISC Use to check blood sugars twice a day 200 each 0   latanoprost (XALATAN) 0.005 % ophthalmic solution SMARTSIG:1 Drop(s) In Eye(s) Every Evening     levothyroxine  (SYNTHROID ) 125 MCG tablet TAKE 1 TABLET BY MOUTH EVERY DAY BEFORE BREAKFAST 90 tablet 3   Magnesium  250 MG TABS Take 250 mg by mouth daily.  meloxicam  (MOBIC ) 7.5 MG tablet TAKE 1 TABLET BY MOUTH EVERY DAY AS NEEDED FOR PAIN 90 tablet 1   Menthol , Topical Analgesic, (BENGAY EX) Apply 1 Application topically daily as needed (arthritis pain).     metFORMIN  (GLUCOPHAGE ) 1000 MG tablet Take 1 tablet (1,000 mg total) by mouth 2 (two) times daily with a meal. 180 tablet 3   omeprazole  (PRILOSEC) 40 MG capsule Take 1 capsule (40 mg total) by mouth at bedtime. 90 capsule 3   pioglitazone  (ACTOS ) 30 MG tablet Take 1 tablet (30 mg total) by mouth daily. 90 tablet 3   Red Yeast Rice Extract 600 MG CAPS Take 600 mg by mouth 2 (two) times daily.     traMADol  (ULTRAM ) 50 MG tablet Take 1 tablet (50 mg total) by mouth every 8 (eight) hours as needed. 90 tablet 5   No current facility-administered medications for this visit.     Current Outpatient Medications:    aspirin 81 MG EC tablet, Take 81 mg by mouth at bedtime., Disp: , Rfl:    atenolol  (TENORMIN ) 50 MG tablet, Take 1 tablet (50 mg total) by mouth daily., Disp: 90 tablet, Rfl: 3   blood glucose meter kit and supplies KIT, Dispense with patient  Insurance; Use up to four times daily as directed. E11.9, Disp: 1 each, Rfl: 0   Blood Glucose Monitoring Suppl (ONE TOUCH ULTRA 2) w/Device KIT, Use as directed up to 4 times per day E11.9, Disp: 1 each, Rfl: 0   Cholecalciferol (VITAMIN D3) 50 MCG (2000 UT) TABS, Take 2,000 Units by mouth daily., Disp: , Rfl:    Co-Enzyme Q-10 100 MG CAPS, Take 100 mg by mouth daily., Disp: , Rfl:    cyanocobalamin  1000 MCG tablet, Take 1,000 mcg by mouth daily., Disp: , Rfl:    dapagliflozin  propanediol (FARXIGA ) 10 MG TABS tablet, Take 1 tablet (10 mg total) by mouth daily before breakfast., Disp: 90 tablet, Rfl: 3   fluticasone  (FLONASE ) 50 MCG/ACT nasal spray, SPRAY 2 SPRAYS INTO EACH NOSTRIL EVERY DAY (Patient taking differently: Place 2 sprays into both nostrils daily as needed for allergies.), Disp: 48 mL, Rfl: 3   glucose blood (ONETOUCH VERIO) test strip, USE TO CHECK BLOOD SUGARS TWICE A DAY, Disp: 200 strip, Rfl: 0   hydrochlorothiazide  (HYDRODIURIL ) 25 MG tablet, Take 1 tablet (25 mg total) by mouth daily., Disp: 90 tablet, Rfl: 3   Lancets MISC, Use to check blood sugars twice a day, Disp: 200 each, Rfl: 0   latanoprost (XALATAN) 0.005 % ophthalmic solution, SMARTSIG:1 Drop(s) In Eye(s) Every Evening, Disp: , Rfl:    levothyroxine  (SYNTHROID ) 125 MCG tablet, TAKE 1 TABLET BY MOUTH EVERY DAY BEFORE BREAKFAST, Disp: 90 tablet, Rfl: 3   Magnesium  250 MG TABS, Take 250 mg by mouth daily., Disp: , Rfl:    meloxicam  (MOBIC ) 7.5 MG tablet, TAKE 1 TABLET BY MOUTH EVERY DAY AS NEEDED FOR PAIN, Disp: 90 tablet, Rfl: 1   Menthol , Topical Analgesic, (BENGAY EX), Apply 1 Application topically daily as needed (arthritis pain)., Disp: ,  Rfl:    metFORMIN  (GLUCOPHAGE ) 1000 MG tablet, Take 1 tablet (1,000 mg total) by mouth 2 (two) times daily with a meal., Disp: 180 tablet, Rfl: 3   omeprazole  (PRILOSEC) 40 MG capsule, Take 1 capsule (40 mg total) by mouth at bedtime., Disp: 90 capsule, Rfl: 3   pioglitazone  (ACTOS ) 30 MG tablet, Take 1 tablet (30 mg total) by mouth daily., Disp: 90 tablet, Rfl: 3   Red  Yeast Rice Extract 600 MG CAPS, Take 600 mg by mouth 2 (two) times daily., Disp: , Rfl:    traMADol  (ULTRAM ) 50 MG tablet, Take 1 tablet (50 mg total) by mouth every 8 (eight) hours as needed., Disp: 90 tablet, Rfl: 5 No Known Allergies Family History  Problem Relation Age of Onset   Arthritis Mother    Hypertension Mother    Coronary artery disease Father    Arthritis Brother    Stroke Brother    Hyperlipidemia Brother    COPD Brother    Coronary artery disease Other        Female 1st degree relative   Diabetes Other        1st degree relative   Colon polyps Other    Colon cancer Neg Hx    Esophageal cancer Neg Hx    Rectal cancer Neg Hx    Stomach cancer Neg Hx    Inflammatory bowel disease Neg Hx    Liver disease Neg Hx    Pancreatic cancer Neg Hx    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  Tobacco Use   Smoking status: Never   Smokeless tobacco: Never  Vaping Use   Vaping status: Never Used  Substance and Sexual Activity   Alcohol  use: Yes    Alcohol /week: 0.0 standard drinks of alcohol     Comment: One or two drinks in month   Drug use: No   Sexual activity: Not Currently  Other Topics Concern   Not on file  Social History Narrative   Not on file   Social Drivers of Health   Financial Resource Strain: Low Risk  (08/26/2023)   Received from [facility]   Overall  Financial Resource Strain (CARDIA)    Difficulty of Paying Living Expenses: Not very hard  Food Insecurity: No Food Insecurity (08/26/2023)   Received from [facility]   Hunger Vital Sign    Worried About Running Out of Food in the Last Year: Never true    Ran Out of Food in the Last Year: Never true  Transportation Needs: No Transportation Needs (08/26/2023)   Received from [facility] - Transportation    Lack of Transportation (Medical): No    Lack of Transportation (Non-Medical): No  Physical Activity: Inactive (06/27/2023)   Exercise Vital Sign    Days of Exercise per Week: 0 days    Minutes of Exercise per Session: 0 min  Stress: No Stress Concern Present (06/27/2023)   [employer] of Occupational Health - Occupational Stress Questionnaire    Feeling of Stress : Only a little  Social Connections: Moderately Integrated (06/27/2023)   Social Connection and Isolation Panel [NHANES]    Frequency of Communication with Friends and Family: More than three times a week    Frequency of Social Gatherings with Friends and Family: Three times a week    Attends Religious Services: More than 4 times per year    Active Member of Clubs or Organizations: Yes    Attends Banker Meetings: More than 4 times per year    Marital Status: Divorced  Intimate Partner Violence: Not At Risk (03/17/2023)   Received from [facility]   HITS  Over the last 12 months how often did your partner physically hurt you?: Never    Over the last 12 months how often did your partner insult you or talk down to you?: Never    Over the last 12 months how often did your partner threaten you with physical harm?: Never    Over the last 12 months how often did your partner scream or curse at you?: Never    Physical Exam: There were no vitals filed for this visit. There is no height or weight on file to calculate BMI. GEN: NAD EYE: Sclerae anicteric ENT: MMM CV: Non-tachycardic GI:  Soft, NT/ND NEURO:  Alert & Oriented x 3  Lab Results: No results for input(s): "WBC", "HGB", "HCT", "PLT" in the last 72 hours. BMET No results for input(s): "NA", "K", "CL", "CO2", "GLUCOSE", "BUN", "CREATININE", "CALCIUM" in the last 72 hours. LFT No results for input(s): "PROT", "ALBUMIN ", "AST", "ALT", "ALKPHOS", "BILITOT", "BILIDIR", "IBILI" in the last 72 hours. PT/INR No results for input(s): "LABPROT", "INR" in the last 72 hours.   Impression / Plan: This is a 83 y.o.female who presents for EGD for evaluation/screening varices.  The risks and benefits of endoscopic evaluation/treatment were discussed with the patient and/or family; these include but are not limited to the risk of perforation, infection, bleeding, missed lesions, lack of diagnosis, severe illness requiring hospitalization, as well as anesthesia and sedation related illnesses.  The patient's history has been reviewed, patient examined, no change in status, and deemed stable for procedure.  The patient and/or family is agreeable to proceed.    Yong Henle, MD [facility] Office # [phone]

## 2023-11-04 NOTE — Op Note (Signed)
 [facility] Patient Name: Kelsey Keller Procedure Date: 11/04/2023 9:15 AM MRN: [id number] Endoscopist: Yong Henle , MD, [id number] Age: 83 Referring MD:  Date of Birth: 20-May-1941 Gender: Female Account #: [id number] Procedure:                Upper GI endoscopy Indications:              Cirrhosis rule out esophageal varices Medicines:                Monitored Anesthesia Care Procedure:                Pre-Anesthesia Assessment:                           - Prior to the procedure, a History and Physical                            was performed, and patient medications and                            allergies were reviewed. The patient's tolerance of                            previous anesthesia was also reviewed. The risks                            and benefits of the procedure and the sedation                            options and risks were discussed with the patient.                            All questions were answered, and informed consent                            was obtained. Prior Anticoagulants: The patient has                            taken no anticoagulant or antiplatelet agents. ASA                            Grade Assessment: III - A patient with severe                            systemic disease. After reviewing the risks and                            benefits, the patient was deemed in satisfactory                            condition to undergo the procedure.                           After obtaining informed consent, the endoscope was  passed under direct vision. Throughout the                            procedure, the patient's blood pressure, pulse, and                            oxygen saturations were monitored continuously. The                            Olympus Scope [id number] was introduced through the                            mouth, and advanced to the second part of duodenum.                            The  upper GI endoscopy was accomplished without                            difficulty. The patient tolerated the procedure. Scope In: Scope Out: Findings:                 The distal esophagus was moderately tortuous.                           No gross mucosal lesions were noted in the entire                            esophagus.                           The Z-line was regular and was found 37 cm from the                            incisors.                           A non-obstructing Schatzki ring was found at the                            gastroesophageal junction.                           A 2 cm hiatal hernia was present.                           Scattered moderate inflammation characterized by                            congestion (edema), erythema and linear erosions                            was found in the gastric antrum.                           No other gross lesions were noted in the entire  examined stomach. Biopsies were taken with a cold                            forceps for histology and Helicobacter pylori                            testing.                           A single 3 mm sessile polyp with no bleeding was                            found in the duodenal bulb. The polyp was removed                            with a cold biopsy forceps. Resection and retrieval                            were complete.                           No other gross lesions were noted in the duodenal                            bulb, in the first portion of the duodenum and in                            the second portion of the duodenum. Complications:            No immediate complications. Estimated Blood Loss:     Estimated blood loss was minimal. Impression:               - Tortuous esophagus.                           - No gross mucosal lesions in the entire esophagus.                           - Z-line regular, 37 cm from the incisors.                            - Non-obstructing Schatzki ring.                           - 2 cm hiatal hernia.                           - Antral gastritis. No other gross lesions in the                            entire stomach. Biopsied.                           - A single duodenal polyp in the duodenal bulb.  Resected and retrieved.                           - No other gross lesions in the duodenal bulb, in                            the first portion of the duodenum and in the second                            portion of the duodenum. Recommendation:           - The patient will be observed post-procedure,                            until all discharge criteria are met.                           - Discharge patient to home.                           - Patient has a contact number available for                            emergencies. The signs and symptoms of potential                            delayed complications were discussed with the                            patient. Return to normal activities tomorrow.                            Written discharge instructions were provided to the                            patient.                           - Resume previous diet.                           - Increase Omeprazole  to 40 mg twice daily. If no                            anticoagulation is considered, then after 2 months                            can go back to once daily. If anticoagulation is                            considered or going to be pursued, then consider                            keeping her on Omeprazole  40 mg twice daily.                           -  Observe patient's clinical course.                           - Continue present medications.                           - Await pathology results.                           - Spoke with [facility] for followup                            in what appears to be the patient being in                             Rate-Controlled Atrial Fibrillation on her current                            Atenolol  dosing. From a GI perspective, she does                            not have varices in esophagus or stomach that would                            preclude anticoagulation if indicated (though                            patient feels that she may not want anticoagulation                            if it were to be offered. They will reach out to                            patient to coordinate follow-up.                           - Repeat EGD in 3-years for variceal screening.                           - The findings and recommendations were discussed                            with the patient.                           - The findings and recommendations were discussed                            with the patient's family. Yong Henle, MD 11/04/2023 9:54:12 AM

## 2023-11-04 NOTE — Progress Notes (Signed)
 Called to room to assist during endoscopic procedure.  Patient ID and intended procedure confirmed with present staff. Received instructions for my participation in the procedure from the performing physician.

## 2023-11-04 NOTE — Patient Instructions (Signed)

## 2023-11-05 ENCOUNTER — Telehealth: Payer: Self-pay

## 2023-11-05 NOTE — Telephone Encounter (Signed)
 Post procedure follow up call, no answer

## 2023-11-07 LAB — SURGICAL PATHOLOGY

## 2023-11-08 ENCOUNTER — Encounter: Payer: Self-pay | Admitting: Gastroenterology

## 2023-11-11 ENCOUNTER — Ambulatory Visit (HOSPITAL_COMMUNITY): Admitting: Internal Medicine

## 2023-11-12 ENCOUNTER — Other Ambulatory Visit: Payer: Self-pay

## 2023-11-12 ENCOUNTER — Other Ambulatory Visit: Payer: Self-pay | Admitting: Internal Medicine

## 2023-11-13 ENCOUNTER — Other Ambulatory Visit: Payer: Self-pay | Admitting: Internal Medicine

## 2023-11-24 DIAGNOSIS — G894 Chronic pain syndrome: Secondary | ICD-10-CM | POA: Diagnosis not present

## 2023-11-24 DIAGNOSIS — G5703 Lesion of sciatic nerve, bilateral lower limbs: Secondary | ICD-10-CM | POA: Diagnosis not present

## 2023-12-02 DIAGNOSIS — H401131 Primary open-angle glaucoma, bilateral, mild stage: Secondary | ICD-10-CM | POA: Diagnosis not present

## 2023-12-09 ENCOUNTER — Ambulatory Visit (INDEPENDENT_AMBULATORY_CARE_PROVIDER_SITE_OTHER)

## 2023-12-09 VITALS — BP 122/78 | Ht 64.0 in | Wt 205.4 lb

## 2023-12-09 DIAGNOSIS — Z0000 Encounter for general adult medical examination without abnormal findings: Secondary | ICD-10-CM

## 2023-12-09 NOTE — Progress Notes (Signed)
 Subjective:   Kelsey Keller is a 83 y.o. who presents for a Medicare Wellness preventive visit.  As a reminder, Annual Wellness Visits don't include a physical exam, and some assessments may be limited, especially if this visit is performed virtually. We may recommend an in-person follow-up visit with your provider if needed.  Visit Complete: In person  Persons Participating in Visit: Patient.  AWV Questionnaire: Yes: Patient Medicare AWV questionnaire was completed by the patient on 12/06/2023; I have confirmed that all information answered by patient is correct and no changes since this date.  Cardiac Risk Factors include: advanced age (>83men, >29 women);diabetes mellitus;dyslipidemia;hypertension;obesity (BMI >30kg/m2)     Objective:     Today's Vitals   12/09/23 0808  BP: 122/78  Weight: 205 lb 6.4 oz (93.2 kg)  Height: 5\' 4"  (1.626 m)   Body mass index is 35.26 kg/m.     12/09/2023    8:04 AM 03/13/2023   10:15 AM 09/30/2022    3:00 PM 09/25/2022    8:49 AM 09/25/2022    8:43 AM 08/14/2022   11:11 AM 06/26/2022    6:45 AM  Advanced Directives  Does Patient Have a Medical Advance Directive? Yes No Yes  Yes Yes Yes  Type of Estate agent of [place];Living will  Healthcare Power of [employer] of [place];Living will Living will;Healthcare Power of [employer] Power of [place];Living will  Does patient want to make changes to medical advance directive? No - Patient declined  No - Patient declined No - Patient declined No - Patient declined  No - Patient declined  Copy of Healthcare Power of Attorney in Chart? Yes - validated most recent copy scanned in chart (See row information)   No - copy requested No - copy requested No - copy requested No - copy requested  Would patient like information on creating a medical advance directive?  No - Patient declined         Current Medications (verified) Outpatient Encounter Medications  as of 12/09/2023  Medication Sig   aspirin 81 MG EC tablet Take 81 mg by mouth at bedtime.   atenolol  (TENORMIN ) 50 MG tablet Take 1 tablet (50 mg total) by mouth daily.   blood glucose meter kit and supplies KIT Dispense with patient  Insurance; Use up to four times daily as directed. E11.9   Blood Glucose Monitoring Suppl (ONE TOUCH ULTRA 2) w/Device KIT Use as directed up to 4 times per day E11.9   Cholecalciferol (VITAMIN D3) 50 MCG (2000 UT) TABS Take 2,000 Units by mouth daily.   Co-Enzyme Q-10 100 MG CAPS Take 100 mg by mouth daily.   cyanocobalamin  1000 MCG tablet Take 1,000 mcg by mouth daily.   dapagliflozin  propanediol (FARXIGA ) 10 MG TABS tablet Take 1 tablet (10 mg total) by mouth daily before breakfast.   fluticasone  (FLONASE ) 50 MCG/ACT nasal spray SPRAY 2 SPRAYS INTO EACH NOSTRIL EVERY DAY (Patient taking differently: Place 2 sprays into both nostrils daily as needed for allergies.)   glucose blood (ONETOUCH VERIO) test strip USE TO CHECK BLOOD SUGARS TWICE A DAY   hydrochlorothiazide  (HYDRODIURIL ) 25 MG tablet Take 1 tablet (25 mg total) by mouth daily.   Lancets MISC Use to check blood sugars twice a day   latanoprost (XALATAN) 0.005 % ophthalmic solution SMARTSIG:1 Drop(s) In Eye(s) Every Evening   levothyroxine  (SYNTHROID ) 125 MCG tablet TAKE 1 TABLET BY MOUTH EVERY DAY BEFORE BREAKFAST   Magnesium  250 MG TABS  Take 250 mg by mouth daily.   meloxicam  (MOBIC ) 7.5 MG tablet TAKE 1 TABLET BY MOUTH EVERY DAY AS NEEDED FOR PAIN   Menthol , Topical Analgesic, (BENGAY EX) Apply 1 Application topically daily as needed (arthritis pain).   metFORMIN  (GLUCOPHAGE ) 1000 MG tablet Take 1 tablet (1,000 mg total) by mouth 2 (two) times daily with a meal.   omeprazole  (PRILOSEC) 40 MG capsule Take 1 capsule (40 mg total) by mouth in the morning and at bedtime. Twice a day for 2 months, then decrease to once daily. If started on blood thinners continue twice daily.   pioglitazone  (ACTOS ) 30 MG  tablet Take 1 tablet (30 mg total) by mouth daily.   Red Yeast Rice Extract 600 MG CAPS Take 600 mg by mouth 2 (two) times daily.   traMADol  (ULTRAM ) 50 MG tablet Take 1 tablet (50 mg total) by mouth every 8 (eight) hours as needed.   No facility-administered encounter medications on file as of 12/09/2023.    Allergies (verified) Patient has no known allergies.   History: Past Medical History:  Diagnosis Date   ALLERGIC RHINITIS 04/29/2007   Allergy    Basal cell carcinoma 03/24/2018   sclerosis- upper bridge of nose (MOHS)   BCC (basal cell carcinoma) 10/18/2019   right supra orbital region- Cx3 61fu   Cataract    both eyes   DIABETES MELLITUS, TYPE II 06/23/2007   type 2   Elevated liver enzymes 03/2017   Elevated tumor markers    GERD (gastroesophageal reflux disease)    Heart murmur    per patient   HIATAL HERNIA, HX OF 04/29/2007   History of hiatal hernia    HYPERLIPIDEMIA 04/29/2007   diet controlled on CoQ10   HYPERTENSION 04/29/2007   HYPOKALEMIA 05/02/2009   HYPOTHYROIDISM 04/29/2007   LOW BACK PAIN 04/29/2007   Neuromuscular disorder (HCC)    diabetic neuropathy in both legs   OSTEOARTHRITIS, KNEE, LEFT 06/23/2007   OSTEOPOROSIS NOS 05/12/2007   SPINAL STENOSIS, LUMBAR 04/29/2007   Past Surgical History:  Procedure Laterality Date   BACK SURGERY N/A 2014 x 2   T1 TO S 1    BACK SURGERY     CATARACT EXTRACTION W/ INTRAOCULAR LENS  IMPLANT, BILATERAL Bilateral 05/2011   doing well   COLONOSCOPY WITH PROPOFOL  N/A 04/10/2017   Procedure: COLONOSCOPY WITH PROPOFOL ;  Surgeon: Janel Medford, MD;  Location: [facility];  Service: Endoscopy;  Laterality: N/A;   COLONSCOPY  2011   JOINT REPLACEMENT     TOTAL KNEE ARTHROPLASTY  2006   right   TOTAL KNEE ARTHROPLASTY Left 2012   Family History  Problem Relation Age of Onset   Arthritis Mother    Hypertension Mother    Coronary artery disease Father    Arthritis Brother    Stroke Brother     Hyperlipidemia Brother    COPD Brother    Coronary artery disease Other        Female 1st degree relative   Diabetes Other        1st degree relative   Colon polyps Other    Colon cancer Neg Hx    Esophageal cancer Neg Hx    Rectal cancer Neg Hx    Stomach cancer Neg Hx    Inflammatory bowel disease Neg Hx    Liver disease Neg Hx    Pancreatic cancer Neg Hx    Social History   Socioeconomic History   Marital status: Divorced    Spouse  name: Not on file   Number of children: 1   Years of education: Not on file   Highest education level: GED or equivalent  Occupational History   Occupation: teller at [employer]    Employer: RETIRED  Tobacco Use   Smoking status: Never   Smokeless tobacco: Never  Vaping Use   Vaping status: Never Used  Substance and Sexual Activity   Alcohol  use: Yes    Alcohol /week: 0.0 standard drinks of alcohol     Comment: One or two drinks in month   Drug use: No   Sexual activity: Not Currently  Other Topics Concern   Not on file  Social History Narrative   Not on file   Social Drivers of Health   Financial Resource Strain: Low Risk  (12/09/2023)   Overall Financial Resource Strain (CARDIA)    Difficulty of Paying Living Expenses: Not very hard  Food Insecurity: No Food Insecurity (12/09/2023)   Hunger Vital Sign    Worried About Running Out of Food in the Last Year: Never true    Ran Out of Food in the Last Year: Never true  Transportation Needs: No Transportation Needs (12/09/2023)   PRAPARE - Administrator, Civil Service (Medical): No    Lack of Transportation (Non-Medical): No  Physical Activity: Insufficiently Active (12/09/2023)   Exercise Vital Sign    Days of Exercise per Week: 7 days    Minutes of Exercise per Session: 10 min  Stress: No Stress Concern Present (12/09/2023)   [employer] of Occupational Health - Occupational Stress Questionnaire    Feeling of Stress : Only a little  Social Connections: Moderately Integrated  (12/09/2023)   Social Connection and Isolation Panel [NHANES]    Frequency of Communication with Friends and Family: More than three times a week    Frequency of Social Gatherings with Friends and Family: Three times a week    Attends Religious Services: More than 4 times per year    Active Member of Clubs or Organizations: Yes    Attends Engineer, structural: More than 4 times per year    Marital Status: Divorced    Tobacco Counseling Counseling given: No    Clinical Intake:  Pre-visit preparation completed: Yes  Pain : No/denies pain     BMI - recorded: 35.26 Nutritional Risks: None Diabetes: Yes CBG done?: Yes CBG resulted in Enter/ Edit results?: Yes (fasting - 151) Did pt. bring in CBG monitor from home?: No  Lab Results  Component Value Date   HGBA1C 7.8 (H) 07/01/2023   HGBA1C 7.8 (H) 01/07/2023   HGBA1C 8.0 (H) 09/09/2022     How often do you need to have someone help you when you read instructions, pamphlets, or other written materials from your doctor or pharmacy?: 1 - Never  Interpreter Needed?: No  Information entered by :: Kandy Orris, CMA   Activities of Daily Living     12/06/2023   10:57 AM 03/13/2023   10:00 AM  In your present state of health, do you have any difficulty performing the following activities:  Hearing? 0 0  Vision? 0 0  Difficulty concentrating or making decisions? 0 0  Walking or climbing stairs? 1 1  Comment uses a walker   Dressing or bathing? 0 0  Doing errands, shopping? 1 1  Preparing Food and eating ? N N  Using the Toilet? N N  In the past six months, have you accidently leaked urine? Colie Dawes  Comment  wears a depend   Do you have problems with loss of bowel control? Colie Dawes  Comment wears a depend   Managing your Medications? N N  Managing your Finances? N N  Housekeeping or managing your Housekeeping? Colie Dawes    Patient Care Team: Roslyn Coombe, MD as PCP - General Lucendia Rusk, MD as PCP - Cardiology  (Cardiology) [place], Heather, [place] (Optometry) Mansouraty, Albino Alu., MD as Consulting Physician (Gastroenterology) [place], Peter M, MD as Consulting Physician (Cardiology) Domenica Fried, MD as Referring Physician (Pain Medicine) Elna Haggis, MD as Consulting Physician (Neurosurgery) Radene Buffalo, DPM as Consulting Physician (Podiatry)  Indicate any recent Medical Services you may have received from other than [facility] providers in the past year (date may be approximate).     Assessment:    This is a routine wellness examination for [facility].  Hearing/Vision screen Hearing Screening - Comments:: Denies hearing difficulties   Vision Screening - Comments:: Wears rx glasses - up to date with routine eye exams with Dr Heather [place]   Goals Addressed               This Visit's Progress     Weight (lb) < 200 lb (90.7 kg) (pt-stated)   205 lb 6.4 oz (93.2 kg)     Patient stated she wants to lose weight - about 20lbs with [employer].       Depression Screen     12/09/2023    8:14 AM 07/01/2023    9:46 AM 03/13/2023   10:17 AM 01/07/2023    2:01 PM 09/12/2022    9:02 AM 02/26/2022   10:31 AM 09/06/2021   10:03 AM  PHQ 2/9 Scores  PHQ - 2 Score 0 0 0 0 0 0 0  PHQ- 9 Score 3  0  0      Fall Risk     12/09/2023    8:13 AM 12/06/2023   10:57 AM 07/01/2023    9:46 AM 03/13/2023   10:00 AM 03/08/2023    8:51 PM  Fall Risk   Falls in the past year? 0 0 1 0 0  Number falls in past yr: 0 0 0 0 0  Injury with Fall? 0 0 0 0   Risk for fall due to : No Fall Risks  History of fall(s) No Fall Risks   Follow up Falls evaluation completed;Falls prevention discussed  Falls evaluation completed Falls prevention discussed     MEDICARE RISK AT HOME:  Medicare Risk at Home Any stairs in or around the home?: Yes (outside) If so, are there any without handrails?: No Home free of loose throw rugs in walkways, pet beds, electrical cords, etc?: Yes Adequate lighting in your home to reduce  risk of falls?: Yes Life alert?: No Use of a cane, walker or w/c?: Yes (cane/walker) Grab bars in the bathroom?: Yes Shower chair or bench in shower?: Yes Elevated toilet seat or a handicapped toilet?: Yes  TIMED UP AND GO:  Was the test performed?  No  Cognitive Function: 6CIT completed    10/19/2014    3:20 PM  MMSE - Mini Mental State Exam  Orientation to time 5  Orientation to Place 5  Registration 3  Attention/ Calculation 3  Language- name 2 objects 2  Language- repeat 1  Language- follow 3 step command 3  Language- read & follow direction 1  Write a sentence 1  Copy design 1        12/09/2023  8:19 AM 03/13/2023   10:00 AM 01/20/2020    8:56 AM  6CIT Screen  What Year? 0 points 0 points 0 points  What month? 0 points 0 points 0 points  What time? 0 points 0 points 3 points  Count back from 20 0 points 0 points 0 points  Months in reverse 0 points 0 points 0 points  Repeat phrase 0 points 0 points 0 points  Total Score 0 points 0 points 3 points    Immunizations Immunization History  Administered Date(s) Administered   Fluad Quad(high Dose 65+) 03/26/2021   Influenza, High Dose Seasonal PF 04/15/2018, 03/20/2019, 04/10/2020, 03/26/2021, 03/26/2022, 02/24/2023   Influenza-Unspecified 05/05/2017, 04/15/2018   Moderna Covid-19 Fall Seasonal Vaccine 12yrs & older 02/24/2023   PFIZER(Purple Top)SARS-COV-2 Vaccination 07/30/2019, 08/20/2019, 04/17/2020   PNEUMOCOCCAL CONJUGATE-20 12/11/2021   Pfizer Covid-19 Vaccine Bivalent Booster 10yrs & up 05/16/2021   Pneumococcal Conjugate-13 04/15/2013   Pneumococcal Polysaccharide-23 04/27/2008   Rsv, Bivalent, Protein Subunit Rsvpref,pf Pattricia Bores) 07/17/2022   Td 10/25/2008   Td (Adult), 2 Lf Tetanus Toxid, Preservative Free 10/25/2008   Tdap 01/26/2019   Unspecified SARS-COV-2 Vaccination 05/07/2022   Zoster Recombinant(Shingrix) 03/04/2022, 05/07/2022   Zoster, Live 10/20/2007    Screening Tests Health  Maintenance  Topic Date Due   COVID-19 Vaccine (7 - 2024-25 season) 04/21/2023   FOOT EXAM  09/12/2023   HEMOGLOBIN A1C  12/30/2023   INFLUENZA VACCINE  02/13/2024   OPHTHALMOLOGY EXAM  06/15/2024   Diabetic kidney evaluation - eGFR measurement  06/30/2024   Diabetic kidney evaluation - Urine ACR  06/30/2024   Medicare Annual Wellness (AWV)  12/08/2024   DTaP/Tdap/Td (4 - Td or Tdap) 01/25/2029   Pneumonia Vaccine 24+ Years old  Completed   DEXA SCAN  Completed   Zoster Vaccines- Shingrix  Completed   HPV VACCINES  Aged Out   Meningococcal B Vaccine  Aged Out   Hepatitis C Screening  Discontinued    Health Maintenance  Health Maintenance Due  Topic Date Due   COVID-19 Vaccine (7 - 2024-25 season) 04/21/2023   FOOT EXAM  09/12/2023   Health Maintenance Items Addressed: 12/09/2023   Additional Screening:  Vision Screening: Recommended annual ophthalmology exams for early detection of glaucoma and other disorders of the eye.  Dental Screening: Recommended annual dental exams for proper oral hygiene  Community Resource Referral / Chronic Care Management: CRR required this visit?  No   CCM required this visit?  No   Plan:    I have personally reviewed and noted the following in the patient's chart:   Medical and social history Use of alcohol , tobacco or illicit drugs  Current medications and supplements including opioid prescriptions. Patient is currently taking opioid prescriptions. Information provided to patient regarding non-opioid alternatives. Patient advised to discuss non-opioid treatment plan with their provider. Functional ability and status Nutritional status Physical activity Advanced directives List of other physicians Hospitalizations, surgeries, and ER visits in previous 12 months Vitals Screenings to include cognitive, depression, and falls Referrals and appointments  In addition, I have reviewed and discussed with patient certain preventive  protocols, quality metrics, and best practice recommendations. A written personalized care plan for preventive services as well as general preventive health recommendations were provided to patient.   Patria Bookbinder, CMA   12/09/2023   After Visit Summary: (In Person-Declined) Patient declined AVS at this time.  Notes: Nothing significant to report at this time.

## 2023-12-09 NOTE — Patient Instructions (Addendum)
 Kelsey Keller , Thank you for taking time out of your busy schedule to complete your Annual Wellness Visit with me. I enjoyed our conversation and look forward to speaking with you again next year. I, as well as your care team,  appreciate your ongoing commitment to your health goals. Please review the following plan we discussed and let me know if I can assist you in the future. Your Game plan/ To Do List   Follow up Visits: Next Medicare AWV with our clinical staff: 12/13/2024   Have you seen your provider in the last 6 months (3 months if uncontrolled diabetes)? Yes Next Office Visit with your provider: 12/30/2023  Clinician Recommendations:  Aim for 30 minutes of exercise or brisk walking, 6-8 glasses of water, and 5 servings of fruits and vegetables each day.       This is a list of the screening recommended for you and due dates:  Health Maintenance  Topic Date Due   COVID-19 Vaccine (7 - 2024-25 season) 04/21/2023   Complete foot exam   09/12/2023   Hemoglobin A1C  12/30/2023   Flu Shot  02/13/2024   Eye exam for diabetics  06/15/2024   Yearly kidney function blood test for diabetes  06/30/2024   Yearly kidney health urinalysis for diabetes  06/30/2024   Medicare Annual Wellness Visit  12/08/2024   DTaP/Tdap/Td vaccine (4 - Td or Tdap) 01/25/2029   Pneumonia Vaccine  Completed   DEXA scan (bone density measurement)  Completed   Zoster (Shingles) Vaccine  Completed   HPV Vaccine  Aged Out   Meningitis B Vaccine  Aged Out   Hepatitis C Screening  Discontinued    Advanced directives: (In Chart) A copy of your advanced directives are scanned into your chart should your provider ever need it. Advance Care Planning is important because it:  [x]  Makes sure you receive the medical care that is consistent with your values, goals, and preferences  [x]  It provides guidance to your family and loved ones and reduces their decisional burden about whether or not they are making the right  decisions based on your wishes.  Follow the link provided in your after visit summary or read over the paperwork we have mailed to you to help you started getting your Advance Directives in place. If you need assistance in completing these, please reach out to us  so that we can help you!    Managing Pain Without Opioids Opioids are strong medicines used to treat moderate to severe pain. For some people, especially those who have long-term (chronic) pain, opioids may not be the best choice for pain management due to: Side effects like nausea, constipation, and sleepiness. The risk of addiction (opioid use disorder). The longer you take opioids, the greater your risk of addiction. Pain that lasts for more than 3 months is called chronic pain. Managing chronic pain usually requires more than one approach and is often provided by a team of health care providers working together (multidisciplinary approach). Pain management may be done at a pain management center or pain clinic. How to manage pain without the use of opioids Use non-opioid medicines Non-opioid medicines for pain may include: Over-the-counter or prescription non-steroidal anti-inflammatory drugs (NSAIDs). These may be the first medicines used for pain. They work well for muscle and bone pain, and they reduce swelling. Acetaminophen . This over-the-counter medicine may work well for milder pain but not swelling. Antidepressants. These may be used to treat chronic pain. A certain type of  antidepressant (tricyclics) is often used. These medicines are given in lower doses for pain than when used for depression. Anticonvulsants. These are usually used to treat seizures but may also reduce nerve (neuropathic) pain. Muscle relaxants. These relieve pain caused by sudden muscle tightening (spasms). You may also use a pain medicine that is applied to the skin as a patch, cream, or gel (topical analgesic), such as a numbing medicine. These may cause  fewer side effects than medicines taken by mouth. Do certain therapies as directed Some therapies can help with pain management. They include: Physical therapy. You will do exercises to gain strength and flexibility. A physical therapist may teach you exercises to move and stretch parts of your body that are weak, stiff, or painful. You can learn these exercises at physical therapy visits and practice them at home. Physical therapy may also involve: Massage. Heat wraps or applying heat or cold to affected areas. Electrical signals that interrupt pain signals (transcutaneous electrical nerve stimulation, TENS). Weak lasers that reduce pain and swelling (low-level laser therapy). Signals from your body that help you learn to regulate pain (biofeedback). Occupational therapy. This helps you to learn ways to function at home and work with less pain. Recreational therapy. This involves trying new activities or hobbies, such as a physical activity or drawing. Mental health therapy, including: Cognitive behavioral therapy (CBT). This helps you learn coping skills for dealing with pain. Acceptance and commitment therapy (ACT) to change the way you think and react to pain. Relaxation therapies, including muscle relaxation exercises and mindfulness-based stress reduction. Pain management counseling. This may be individual, family, or group counseling.  Receive medical treatments Medical treatments for pain management include: Nerve block injections. These may include a pain blocker and anti-inflammatory medicines. You may have injections: Near the spine to relieve chronic back or neck pain. Into joints to relieve back or joint pain. Into nerve areas that supply a painful area to relieve body pain. Into muscles (trigger point injections) to relieve some painful muscle conditions. A medical device placed near your spine to help block pain signals and relieve nerve pain or chronic back pain (spinal cord  stimulation device). Acupuncture. Follow these instructions at home Medicines Take over-the-counter and prescription medicines only as told by your health care provider. If you are taking pain medicine, ask your health care providers about possible side effects to watch out for. Do not drive or use heavy machinery while taking prescription opioid pain medicine. Lifestyle  Do not use drugs or alcohol  to reduce pain. If you drink alcohol , limit how much you have to: 0-1 drink a day for women who are not pregnant. 0-2 drinks a day for men. Know how much alcohol  is in a drink. In the [place] one drink equals one 12 oz bottle of beer (355 mL), one 5 oz glass of wine (148 mL), or one 1 oz glass of hard liquor (44 mL). Do not use any products that contain nicotine or tobacco. These products include cigarettes, chewing tobacco, and vaping devices, such as e-cigarettes. If you need help quitting, ask your health care provider. Eat a healthy diet and maintain a healthy weight. Poor diet and excess weight may make pain worse. Eat foods that are high in fiber. These include fresh fruits and vegetables, whole grains, and beans. Limit foods that are high in fat and processed sugars, such as fried and sweet foods. Exercise regularly. Exercise lowers stress and may help relieve pain. Ask your health care provider what activities  and exercises are safe for you. If your health care provider approves, join an exercise class that combines movement and stress reduction. Examples include yoga and tai chi. Get enough sleep. Lack of sleep may make pain worse. Lower stress as much as possible. Practice stress reduction techniques as told by your therapist. General instructions Work with all your pain management providers to find the treatments that work best for you. You are an important member of your pain management team. There are many things you can do to reduce pain on your own. Consider joining an online or  in-person support group for people who have chronic pain. Keep all follow-up visits. This is important. Where to find more information You can find more information about managing pain without opioids from: American Academy of Pain Medicine: painmed.org Institute for Chronic Pain: instituteforchronicpain.org American Chronic Pain Association: theacpa.org Contact a health care provider if: You have side effects from pain medicine. Your pain gets worse or does not get better with treatments or home therapy. You are struggling with anxiety or depression. Summary Many types of pain can be managed without opioids. Chronic pain may respond better to pain management without opioids. Pain is best managed when you and a team of health care providers work together. Pain management without opioids may include non-opioid medicines, medical treatments, physical therapy, mental health therapy, and lifestyle changes. Tell your health care providers if your pain gets worse or is not being managed well enough. This information is not intended to replace advice given to you by your health care provider. Make sure you discuss any questions you have with your health care provider. Document Revised: 10/11/2020 Document Reviewed: 10/11/2020 Elsevier Patient Education  2024 [employer].

## 2023-12-10 NOTE — Progress Notes (Unsigned)
 Cardiology Office Note:    Date:  12/12/2023   ID:  Kelsey Keller 1941/06/24, MRN [id number]  PCP:  Kelsey Coombe, MD   [facility] Providers Cardiologist:  Avery Bodo, MD     Referring MD: Kelsey Coombe, MD   Chief Complaint  Patient presents with   Atrial Fibrillation    History of Present Illness:    Kelsey Keller is a 83 y.o. female is seen for follow up Afib. She is sister to a long time patient of mine- [employer]. She has a history of DM, HTN, HLD. Was seen by Dr Jacquelynn Matter during hospitalization for spine surgery in March 2024. Had paroxysmal AFib. Echo showed moderate LAE, normal LV function. Grade 2 diastolic dysfunction. Was managed with beta blocker and Eliquis . On follow up one month later was felt to be in NSR but no Ecg done.  Remote Myoview  in 2012 was normal.   She underwent upper EGD recently and was noted to be in Afib with controlled rate. She denies any symptoms of palpitations, dizziness, SOB. She was placed on Eliquis  last year but quit taking it after a couple of months because it made her feel like a "popsicle" - always cold. No history of TIA/CVA or bleeding. Takes meloxicam  on a regular basis- can't really tolerate pain without it. Does have some evidence of cirrhosis but recent EGD showed no varices. Mild antral gastritis. On Protonix  now. She has intentionally lost weight on weight watchers program.   Past Medical History:  Diagnosis Date   ALLERGIC RHINITIS 04/29/2007   Allergy    Basal cell carcinoma 03/24/2018   sclerosis- upper bridge of nose (MOHS)   BCC (basal cell carcinoma) 10/18/2019   right supra orbital region- Cx3 [id number]   Cataract    both eyes   DIABETES MELLITUS, TYPE II 06/23/2007   type 2   Elevated liver enzymes 03/2017   Elevated tumor markers    GERD (gastroesophageal reflux disease)    Heart murmur    per patient   HIATAL HERNIA, HX OF 04/29/2007   History of hiatal hernia    HYPERLIPIDEMIA 04/29/2007    diet controlled on CoQ10   HYPERTENSION 04/29/2007   HYPOKALEMIA 05/02/2009   HYPOTHYROIDISM 04/29/2007   LOW BACK PAIN 04/29/2007   Neuromuscular disorder (HCC)    diabetic neuropathy in both legs   OSTEOARTHRITIS, KNEE, LEFT 06/23/2007   OSTEOPOROSIS NOS 05/12/2007   SPINAL STENOSIS, LUMBAR 04/29/2007    Past Surgical History:  Procedure Laterality Date   BACK SURGERY N/A 2014 x 2   T1 TO S 1    BACK SURGERY     CATARACT EXTRACTION W/ INTRAOCULAR LENS  IMPLANT, BILATERAL Bilateral 05/2011   doing well   COLONOSCOPY WITH PROPOFOL  N/A 04/10/2017   Procedure: COLONOSCOPY WITH PROPOFOL ;  Surgeon: Janel Medford, MD;  Location: [facility];  Service: Endoscopy;  Laterality: N/A;   COLONSCOPY  2011   JOINT REPLACEMENT     TOTAL KNEE ARTHROPLASTY  2006   right   TOTAL KNEE ARTHROPLASTY Left 2012    Current Medications: Current Meds  Medication Sig   atenolol  (TENORMIN ) 50 MG tablet Take 1 tablet (50 mg total) by mouth daily.   blood glucose meter kit and supplies KIT Dispense with patient  Insurance; Use up to four times daily as directed. E11.9   Blood Glucose Monitoring Suppl (ONE TOUCH ULTRA 2) w/Device KIT Use as directed up to 4 times per day E11.9  Cholecalciferol (VITAMIN D3) 50 MCG (2000 UT) TABS Take 2,000 Units by mouth daily.   Co-Enzyme Q-10 100 MG CAPS Take 100 mg by mouth daily.   cyanocobalamin  1000 MCG tablet Take 1,000 mcg by mouth daily.   dapagliflozin  propanediol (FARXIGA ) 10 MG TABS tablet Take 1 tablet (10 mg total) by mouth daily before breakfast.   fluticasone  (FLONASE ) 50 MCG/ACT nasal spray SPRAY 2 SPRAYS INTO EACH NOSTRIL EVERY DAY (Patient taking differently: Place 2 sprays into both nostrils daily as needed for allergies.)   glucose blood (ONETOUCH VERIO) test strip USE TO CHECK BLOOD SUGARS TWICE A DAY   hydrochlorothiazide  (HYDRODIURIL ) 25 MG tablet Take 1 tablet (25 mg total) by mouth daily.   Lancets MISC Use to check blood sugars twice a day    latanoprost (XALATAN) 0.005 % ophthalmic solution SMARTSIG:1 Drop(s) In Eye(s) Every Evening   levothyroxine  (SYNTHROID ) 125 MCG tablet TAKE 1 TABLET BY MOUTH EVERY DAY BEFORE BREAKFAST   Magnesium  250 MG TABS Take 250 mg by mouth daily.   meloxicam  (MOBIC ) 7.5 MG tablet TAKE 1 TABLET BY MOUTH EVERY DAY AS NEEDED FOR PAIN   Menthol , Topical Analgesic, (BENGAY EX) Apply 1 Application topically daily as needed (arthritis pain).   metFORMIN  (GLUCOPHAGE ) 1000 MG tablet Take 1 tablet (1,000 mg total) by mouth 2 (two) times daily with a meal.   omeprazole  (PRILOSEC) 40 MG capsule Take 1 capsule (40 mg total) by mouth in the morning and at bedtime. Twice a day for 2 months, then decrease to once daily. If started on blood thinners continue twice daily.   pioglitazone  (ACTOS ) 30 MG tablet Take 1 tablet (30 mg total) by mouth daily.   Red Yeast Rice Extract 600 MG CAPS Take 600 mg by mouth 2 (two) times daily.   rivaroxaban (XARELTO) 20 MG TABS tablet Take 1 tablet (20 mg total) by mouth daily with supper.   traMADol  (ULTRAM ) 50 MG tablet Take 1 tablet (50 mg total) by mouth every 8 (eight) hours as needed.   [DISCONTINUED] aspirin 81 MG EC tablet Take 81 mg by mouth at bedtime.     Allergies:   Patient has no known allergies.   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  Tobacco Use   Smoking status: Never   Smokeless tobacco: Never  Vaping Use   Vaping status: Never Used  Substance and Sexual Activity   Alcohol  use: Yes    Alcohol /week: 0.0 standard drinks of alcohol     Comment: One or two drinks in month   Drug use: No   Sexual activity: Not Currently  Other Topics Concern   Not on file  Social History Narrative   Not on file   Social Drivers of Health    Financial Resource Strain: Low Risk  (12/09/2023)   Overall Financial Resource Strain (CARDIA)    Difficulty of Paying Living Expenses: Not very hard  Food Insecurity: No Food Insecurity (12/09/2023)   Hunger Vital Sign    Worried About Running Out of Food in the Last Year: Never true    Ran Out of Food in the Last Year: Never true  Transportation Needs: No Transportation Needs (12/09/2023)   PRAPARE - Transportation    Lack  of Transportation (Medical): No    Lack of Transportation (Non-Medical): No  Physical Activity: Insufficiently Active (12/09/2023)   Exercise Vital Sign    Days of Exercise per Week: 7 days    Minutes of Exercise per Session: 10 min  Stress: No Stress Concern Present (12/09/2023)   [employer] of Occupational Health - Occupational Stress Questionnaire    Feeling of Stress : Only a little  Social Connections: Moderately Integrated (12/09/2023)   Social Connection and Isolation Panel [NHANES]    Frequency of Communication with Friends and Family: More than three times a week    Frequency of Social Gatherings with Friends and Family: Three times a week    Attends Religious Services: More than 4 times per year    Active Member of Clubs or Organizations: Yes    Attends Engineer, structural: More than 4 times per year    Marital Status: Divorced     Family History: The patient's family history includes Arthritis in her brother and mother; COPD in her brother; Colon polyps in an other family member; Coronary artery disease in her father and another family member; Diabetes in an other family member; Hyperlipidemia in her brother; Hypertension in her mother; Stroke in her brother. There is no history of Colon cancer, Esophageal cancer, Rectal cancer, Stomach cancer, Inflammatory bowel disease, Liver disease, or Pancreatic cancer.  ROS:   Please see the history of present illness.     All other systems reviewed and are negative.  EKGs/Labs/Other Studies  Reviewed:    The following studies were reviewed today:  EKG Interpretation Date/Time:  Friday Dec 12 2023 11:47:10 EDT Ventricular Rate:  86 PR Interval:    QRS Duration:  82 QT Interval:  340 QTC Calculation: 406 R Axis:   -40  Text Interpretation: Atrial fibrillation Left axis deviation Low voltage QRS Cannot rule out Anteroseptal infarct (cited on or before 03-May-2004) When compared with ECG of 30-Sep-2022 14:26, Questionable change in initial forces of Anterior leads QT has shortened Confirmed by [place], Kaja Jackowski [phone]) on 12/12/2023 11:57:44 AM   Echo 08/14/22: IMPRESSIONS     1. Left ventricular ejection fraction, by estimation, is 60 to 65%. The  left ventricle has normal function. The left ventricle has no regional  wall motion abnormalities. Left ventricular diastolic parameters are  consistent with Grade II diastolic  dysfunction (pseudonormalization).   2. Right ventricular systolic function is normal. The right ventricular  size is normal. There is normal pulmonary artery systolic pressure.   3. Left atrial size was moderately dilated.   4. Right atrial size was mildly dilated.   5. The mitral valve is normal in structure. Trivial mitral valve  regurgitation. No evidence of mitral stenosis.   6. The aortic valve is calcified. There is moderate calcification of the  aortic valve. There is moderate thickening of the aortic valve. Aortic  valve regurgitation is trivial. Mild aortic valve stenosis. Aortic valve  mean gradient measures 16.3 mmHg.   7. The inferior vena cava is normal in size with greater than 50%  respiratory variability, suggesting right atrial pressure of 3 mmHg.   Comparison(s): No prior Echocardiogram.  EKG Interpretation Date/Time:  Friday Dec 12 2023 11:47:10 EDT Ventricular Rate:  86 PR Interval:    QRS Duration:  82 QT Interval:  340 QTC Calculation: 406 R Axis:   -40  Text Interpretation: Atrial fibrillation Left axis deviation Low voltage  QRS Cannot rule out Anteroseptal infarct (cited on or before 03-May-2004) When  compared with ECG of 30-Sep-2022 14:26, Questionable change in initial forces of Anterior leads QT has shortened Confirmed by [place], Timmey Lamba [phone]) on 12/12/2023 11:57:44 AM    Recent Labs: 07/01/2023: ALT 17; BUN 34; Creatinine, Ser 1.08; Hemoglobin 9.6; Platelets 166.0; Potassium 4.5; Sodium 140; TSH 2.80  Recent Lipid Panel    Component Value Date/Time   CHOL 145 07/01/2023 1043   TRIG 103.0 07/01/2023 1043   HDL 63.20 07/01/2023 1043   CHOLHDL 2 07/01/2023 1043   VLDL 20.6 07/01/2023 1043   LDLCALC 61 07/01/2023 1043   LDLCALC 75 01/26/2020 1141   LDLDIRECT 107.5 07/21/2013 1226     Risk Assessment/Calculations:    CHA2DS2-VASc Score = 5   This indicates a 7.2% annual risk of stroke. The patient's score is based upon: CHF History: 0 HTN History: 1 Diabetes History: 1 Stroke History: 0 Vascular Disease History: 0 Age Score: 2 Gender Score: 1               Physical Exam:    VS:  BP 120/68   Pulse 86   Ht 5\' 4"  (1.626 m)   Wt 205 lb 6.4 oz (93.2 kg)   SpO2 97%   BMI 35.26 kg/m     Wt Readings from Last 3 Encounters:  12/12/23 205 lb 6.4 oz (93.2 kg)  12/09/23 205 lb 6.4 oz (93.2 kg)  11/04/23 208 lb (94.3 kg)     GEN:  Well nourished, well developed in no acute distress HEENT: Normal NECK: No JVD; No carotid bruits LYMPHATICS: No lymphadenopathy CARDIAC: IRRR, harsh gr 2/6 systolic murmur RUSB. No  rubs, gallops RESPIRATORY:  Clear to auscultation without rales, wheezing or rhonchi  ABDOMEN: Soft, non-tender, non-distended MUSCULOSKELETAL:  No edema; No deformity  SKIN: Warm and dry NEUROLOGIC:  Alert and oriented x 3 PSYCHIATRIC:  Normal affect   ASSESSMENT:    1. Persistent atrial fibrillation (HCC)   2. Essential hypertension   3. Type 2 diabetes mellitus with other specified complication, without long-term current use of insulin  (HCC)    PLAN:    In order of  problems listed above:  Persistent Afib. I suspect she never converted after her hospitalization in April last year. HR controlled on atenolol . She is asymptomatic. I think restoration of NSR would be difficult given duration and LAE. Recommend rate control strategy. She has a high [place] vasc score of 5. Recommend anticoagulation. Will try Xarelto  20 mg daily rather than Eliquis . If unable to tolerate would consider referral for Watchman LAO device. Will stop ASA. GI prophylaxis with Protonix  given need for meloxicam . HTN  controlled DM per PCP Cirrhosis. No varices.  OSA           Medication Adjustments/Labs and Tests Ordered: Current medicines are reviewed at length with the patient today.  Concerns regarding medicines are outlined above.  Orders Placed This Encounter  Procedures   EKG 12-Lead   Meds ordered this encounter  Medications   rivaroxaban  (XARELTO ) 20 MG TABS tablet    Sig: Take 1 tablet (20 mg total) by mouth daily with supper.    Dispense:  90 tablet    Refill:  3    Patient Instructions  Medication Instructions:  Start Xarelto  20 mg at supper Continue all other medications *If you need a refill on your cardiac medications before your next appointment, please call your pharmacy*  Lab Work: None ordered  Testing/Procedures: None ordered  Follow-Up: At [facility], you and your health needs are our  priority.  As part of our continuing mission to provide you with exceptional heart care, our providers are all part of one team.  This team includes your primary Cardiologist (physician) and Advanced Practice Providers or APPs (Physician Assistants and Nurse Practitioners) who all work together to provide you with the care you need, when you need it.  Your next appointment:  6 months   Call in July to schedule Nov appointment     Provider:  Dr.Oluwadarasimi Favor      We recommend signing up for the patient portal called "MyChart".  Sign up information is provided  on this After Visit Summary.  MyChart is used to connect with patients for Virtual Visits (Telemedicine).  Patients are able to view lab/test results, encounter notes, upcoming appointments, etc.  Non-urgent messages can be sent to your provider as well.   To learn more about what you can do with MyChart, go to [email or web address].          Signed, Derrick Orris [place], MD  12/12/2023 3:14 PM    [facility]

## 2023-12-12 ENCOUNTER — Telehealth: Payer: Self-pay | Admitting: Internal Medicine

## 2023-12-12 ENCOUNTER — Ambulatory Visit: Attending: Cardiology | Admitting: Cardiology

## 2023-12-12 ENCOUNTER — Encounter: Payer: Self-pay | Admitting: Cardiology

## 2023-12-12 VITALS — BP 120/68 | HR 86 | Ht 64.0 in | Wt 205.4 lb

## 2023-12-12 DIAGNOSIS — I10 Essential (primary) hypertension: Secondary | ICD-10-CM

## 2023-12-12 DIAGNOSIS — I4819 Other persistent atrial fibrillation: Secondary | ICD-10-CM | POA: Diagnosis not present

## 2023-12-12 DIAGNOSIS — E1169 Type 2 diabetes mellitus with other specified complication: Secondary | ICD-10-CM

## 2023-12-12 MED ORDER — RIVAROXABAN 20 MG PO TABS
20.0000 mg | ORAL_TABLET | Freq: Every day | ORAL | 3 refills | Status: AC
Start: 2023-12-12 — End: ?

## 2023-12-12 NOTE — Telephone Encounter (Signed)
 Copied from CRM [id number]. Topic: Clinical - Request for Lab/Test Order >> Dec 12, 2023  2:36 PM Kelsey Keller wrote: Reason for CRM: Patient would like to know if  she needs labs drawn before she comes for her 6 m f.u on 12/30/2023?

## 2023-12-12 NOTE — Telephone Encounter (Signed)
 Ok sure and lab orders are already in place

## 2023-12-12 NOTE — Patient Instructions (Signed)
 Medication Instructions:  Start Xarelto 20 mg at supper Continue all other medications *If you need a refill on your cardiac medications before your next appointment, please call your pharmacy*  Lab Work: None ordered  Testing/Procedures: None ordered  Follow-Up: At [facility], you and your health needs are our priority.  As part of our continuing mission to provide you with exceptional heart care, our providers are all part of one team.  This team includes your primary Cardiologist (physician) and Advanced Practice Providers or APPs (Physician Assistants and Nurse Practitioners) who all work together to provide you with the care you need, when you need it.  Your next appointment:  6 months   Call in July to schedule Nov appointment     Provider:  Dr.Jordan      We recommend signing up for the patient portal called "MyChart".  Sign up information is provided on this After Visit Summary.  MyChart is used to connect with patients for Virtual Visits (Telemedicine).  Patients are able to view lab/test results, encounter notes, upcoming appointments, etc.  Non-urgent messages can be sent to your provider as well.   To learn more about what you can do with MyChart, go to [email or web address].

## 2023-12-15 NOTE — Telephone Encounter (Signed)
 Called and let Pt know

## 2023-12-17 ENCOUNTER — Other Ambulatory Visit (INDEPENDENT_AMBULATORY_CARE_PROVIDER_SITE_OTHER)

## 2023-12-17 DIAGNOSIS — E559 Vitamin D deficiency, unspecified: Secondary | ICD-10-CM

## 2023-12-17 DIAGNOSIS — E538 Deficiency of other specified B group vitamins: Secondary | ICD-10-CM

## 2023-12-17 DIAGNOSIS — E1165 Type 2 diabetes mellitus with hyperglycemia: Secondary | ICD-10-CM | POA: Diagnosis not present

## 2023-12-17 LAB — CBC WITH DIFFERENTIAL/PLATELET
Basophils Absolute: 0 10*3/uL (ref 0.0–0.1)
Basophils Relative: 1.1 % (ref 0.0–3.0)
Eosinophils Absolute: 0.2 10*3/uL (ref 0.0–0.7)
Eosinophils Relative: 4.5 % (ref 0.0–5.0)
HCT: 31 % — ABNORMAL LOW (ref 36.0–46.0)
Hemoglobin: 9.9 g/dL — ABNORMAL LOW (ref 12.0–15.0)
Lymphocytes Relative: 20.6 % (ref 12.0–46.0)
Lymphs Abs: 0.9 10*3/uL (ref 0.7–4.0)
MCHC: 32 g/dL (ref 30.0–36.0)
MCV: 82.5 fl (ref 78.0–100.0)
Monocytes Absolute: 0.5 10*3/uL (ref 0.1–1.0)
Monocytes Relative: 10.7 % (ref 3.0–12.0)
Neutro Abs: 2.7 10*3/uL (ref 1.4–7.7)
Neutrophils Relative %: 63.1 % (ref 43.0–77.0)
Platelets: 155 10*3/uL (ref 150.0–400.0)
RBC: 3.76 Mil/uL — ABNORMAL LOW (ref 3.87–5.11)
RDW: 19.1 % — ABNORMAL HIGH (ref 11.5–15.5)
WBC: 4.3 10*3/uL (ref 4.0–10.5)

## 2023-12-17 LAB — LIPID PANEL
Cholesterol: 129 mg/dL (ref 0–200)
HDL: 59.2 mg/dL (ref >39.00)
LDL Cholesterol: 54 mg/dL (ref 0–99)
NonHDL: 69.56
Total CHOL/HDL Ratio: 2
Triglycerides: 78 mg/dL (ref 0.0–149.0)
VLDL: 15.6 mg/dL (ref 0.0–40.0)

## 2023-12-17 LAB — BASIC METABOLIC PANEL WITH GFR
BUN: 33 mg/dL — ABNORMAL HIGH (ref 6–23)
CO2: 31 meq/L (ref 19–32)
Calcium: 9.6 mg/dL (ref 8.4–10.5)
Chloride: 99 meq/L (ref 96–112)
Creatinine, Ser: 1.12 mg/dL (ref 0.40–1.20)
GFR: 46.24 mL/min — ABNORMAL LOW (ref >60.00)
Glucose, Bld: 143 mg/dL — ABNORMAL HIGH (ref 70–99)
Potassium: 3.9 meq/L (ref 3.5–5.1)
Sodium: 137 meq/L (ref 135–145)

## 2023-12-17 LAB — URINALYSIS, ROUTINE W REFLEX MICROSCOPIC
Bilirubin Urine: NEGATIVE
Hgb urine dipstick: NEGATIVE
Ketones, ur: NEGATIVE
Leukocytes,Ua: NEGATIVE
Nitrite: NEGATIVE
RBC / HPF: NONE SEEN (ref 0–?)
Specific Gravity, Urine: 1.015 (ref 1.000–1.030)
Total Protein, Urine: NEGATIVE
Urine Glucose: >=1000 — AB
Urobilinogen, UA: 0.2 (ref 0.0–1.0)
pH: 6 (ref 5.0–8.0)

## 2023-12-17 LAB — VITAMIN D 25 HYDROXY (VIT D DEFICIENCY, FRACTURES): VITD: 39.53 ng/mL (ref 30.00–100.00)

## 2023-12-17 LAB — HEPATIC FUNCTION PANEL
ALT: 14 U/L (ref 0–35)
AST: 22 U/L (ref 0–37)
Albumin: 3.8 g/dL (ref 3.5–5.2)
Alkaline Phosphatase: 61 U/L (ref 39–117)
Bilirubin, Direct: 0.2 mg/dL (ref 0.0–0.3)
Total Bilirubin: 0.9 mg/dL (ref 0.2–1.2)
Total Protein: 6.5 g/dL (ref 6.0–8.3)

## 2023-12-17 LAB — VITAMIN B12: Vitamin B-12: 473 pg/mL (ref 211–911)

## 2023-12-17 LAB — TSH: TSH: 1.42 u[IU]/mL (ref 0.35–5.50)

## 2023-12-17 LAB — MICROALBUMIN / CREATININE URINE RATIO
Creatinine,U: 71.3 mg/dL
Microalb Creat Ratio: 34.2 mg/g — ABNORMAL HIGH (ref 0.0–30.0)
Microalb, Ur: 2.4 mg/dL — ABNORMAL HIGH (ref 0.0–1.9)

## 2023-12-17 LAB — HEMOGLOBIN A1C: Hgb A1c MFr Bld: 7.5 % — ABNORMAL HIGH (ref 4.6–6.5)

## 2023-12-18 ENCOUNTER — Encounter: Payer: Self-pay | Admitting: Internal Medicine

## 2023-12-18 ENCOUNTER — Ambulatory Visit: Admitting: Internal Medicine

## 2023-12-18 ENCOUNTER — Ambulatory Visit: Payer: Self-pay

## 2023-12-18 VITALS — BP 108/68 | HR 82 | Temp 97.9°F | Ht 64.0 in | Wt 208.0 lb

## 2023-12-18 DIAGNOSIS — E1165 Type 2 diabetes mellitus with hyperglycemia: Secondary | ICD-10-CM

## 2023-12-18 DIAGNOSIS — S91109A Unspecified open wound of unspecified toe(s) without damage to nail, initial encounter: Secondary | ICD-10-CM | POA: Insufficient documentation

## 2023-12-18 MED ORDER — DOXYCYCLINE HYCLATE 100 MG PO TABS: 100.0000 mg | ORAL_TABLET | Freq: Two times a day (BID) | ORAL | 1 refills | Status: DC

## 2023-12-18 MED ORDER — MUPIROCIN 2 % EX OINT: TOPICAL_OINTMENT | CUTANEOUS | 0 refills | Status: DC

## 2023-12-18 NOTE — Telephone Encounter (Signed)
 FYI Only or Action Required?: FYI only for provider  Patient was last seen in primary care on 07/25/2023 by Zorita Hiss, NP. Called Nurse Triage reporting Toe Pain and Toe Swelling. Symptoms began several days ago. Interventions attempted: Rest, hydration, or home remedies. Symptoms are: gradually worsening.  Triage Disposition: See Physician Within 24 Hours  Patient/caregiver understands and will follow disposition?: Yes                       Copied from CRM [id number]. Topic: Clinical - Red Word Triage >> Dec 18, 2023  9:41 AM Freya Jesus wrote: Red Word that prompted transfer to Nurse Triage: Patient stated  her toe is infected and it's been throbbing off and on. It also looks swollen and sore to touch. Patient is concerned because she is a diabetic. Reason for Disposition  [1] Swollen toe AND [2] no fever  (Exceptions: Just a localized bump from bunion, corns, insect bite, sting.)  Answer Assessment - Initial Assessment Questions 1. ONSET: "When did the pain start?"      Couple days - pt states she bumped into a chair in the middle of the night and symptoms have worsened  2. LOCATION: "Where is the pain located?"   (e.g., around nail, entire toe, at foot joint)      L second toe  3. PAIN: "How bad is the pain?"    (Scale 1-10; or mild, moderate, severe)   -  MILD (1-3): doesn't interfere with normal activities    -  MODERATE (4-7): interferes with normal activities (e.g., work or school) or awakens from sleep, limping    -  SEVERE (8-10): excruciating pain, unable to do any normal activities, unable to walk     7/10 4. APPEARANCE: "What does the toe look like?" (e.g., redness, swelling, bruising, pallor)     Pt states it looks infected, swollen, redness; "I took my thumb and rubbed, I saw some liquid", colorless 5. CAUSE: "What do you think is causing the toe pain?"     Bumped toe into a chair, hx of diabetes  6. OTHER SYMPTOMS: "Do you have any other symptoms?" (e.g.,  leg pain, rash, fever, numbness)     Throbbing pain, felt better sitting in a chair, very tender. Denies fever or chills. Denies N/V. Endorses neuropathy, numbness at baseline   Pt cleaned it, bandaged it  Protocols used: Toe Pain-A-AH

## 2023-12-18 NOTE — Assessment & Plan Note (Signed)
 Discussed the risk of foot  infection

## 2023-12-18 NOTE — Progress Notes (Signed)
 Subjective:  Patient ID: Kelsey Keller, female    DOB: 1941-05-19  Age: 83 y.o. MRN: [id number]  CC: Medical Management of Chronic Issues   HPI Kelsey Keller presents for painful toe since last night. She hit furniture leg w/her foot  Outpatient Medications Prior to Visit  Medication Sig Dispense Refill   atenolol  (TENORMIN ) 50 MG tablet Take 1 tablet (50 mg total) by mouth daily. 90 tablet 3   blood glucose meter kit and supplies KIT Dispense with patient  Insurance; Use up to four times daily as directed. E11.9 1 each 0   Blood Glucose Monitoring Suppl (ONE TOUCH ULTRA 2) w/Device KIT Use as directed up to 4 times per day E11.9 1 each 0   Cholecalciferol (VITAMIN D3) 50 MCG (2000 UT) TABS Take 2,000 Units by mouth daily.     Co-Enzyme Q-10 100 MG CAPS Take 100 mg by mouth daily.     cyanocobalamin  1000 MCG tablet Take 1,000 mcg by mouth daily.     dapagliflozin  propanediol (FARXIGA ) 10 MG TABS tablet Take 1 tablet (10 mg total) by mouth daily before breakfast. 90 tablet 3   fluticasone  (FLONASE ) 50 MCG/ACT nasal spray SPRAY 2 SPRAYS INTO EACH NOSTRIL EVERY DAY (Patient taking differently: Place 2 sprays into both nostrils daily as needed for allergies.) 48 mL 3   glucose blood (ONETOUCH VERIO) test strip USE TO CHECK BLOOD SUGARS TWICE A DAY 200 strip 0   hydrochlorothiazide  (HYDRODIURIL ) 25 MG tablet Take 1 tablet (25 mg total) by mouth daily. 90 tablet 3   Lancets MISC Use to check blood sugars twice a day 200 each 0   latanoprost (XALATAN) 0.005 % ophthalmic solution SMARTSIG:1 Drop(s) In Eye(s) Every Evening     levothyroxine  (SYNTHROID ) 125 MCG tablet TAKE 1 TABLET BY MOUTH EVERY DAY BEFORE BREAKFAST 90 tablet 3   Magnesium  250 MG TABS Take 250 mg by mouth daily.     meloxicam  (MOBIC ) 7.5 MG tablet TAKE 1 TABLET BY MOUTH EVERY DAY AS NEEDED FOR PAIN 90 tablet 1   Menthol , Topical Analgesic, (BENGAY EX) Apply 1 Application topically daily as needed (arthritis pain).      metFORMIN  (GLUCOPHAGE ) 1000 MG tablet Take 1 tablet (1,000 mg total) by mouth 2 (two) times daily with a meal. 180 tablet 3   omeprazole  (PRILOSEC) 40 MG capsule Take 1 capsule (40 mg total) by mouth in the morning and at bedtime. Twice a day for 2 months, then decrease to once daily. If started on blood thinners continue twice daily. 120 capsule 5   pioglitazone  (ACTOS ) 30 MG tablet Take 1 tablet (30 mg total) by mouth daily. 90 tablet 3   Red Yeast Rice Extract 600 MG CAPS Take 600 mg by mouth 2 (two) times daily.     rivaroxaban  (XARELTO ) 20 MG TABS tablet Take 1 tablet (20 mg total) by mouth daily with supper. 90 tablet 3   traMADol  (ULTRAM ) 50 MG tablet Take 1 tablet (50 mg total) by mouth every 8 (eight) hours as needed. 90 tablet 5   No facility-administered medications prior to visit.    ROS: Review of Systems  Constitutional:  Negative for activity change, appetite change, chills, fatigue and unexpected weight change.  HENT:  Negative for congestion, mouth sores and sinus pressure.   Eyes:  Negative for visual disturbance.  Respiratory:  Negative for cough and chest tightness.   Gastrointestinal:  Negative for abdominal pain and nausea.  Genitourinary:  Negative for difficulty urinating,  frequency and vaginal pain.  Musculoskeletal:  Negative for back pain and gait problem.  Skin:  Positive for wound. Negative for pallor and rash.  Neurological:  Negative for dizziness, tremors, weakness, numbness and headaches.  Psychiatric/Behavioral:  Negative for confusion and sleep disturbance. The patient is not nervous/anxious.     Objective:  BP 108/68   Pulse 82   Temp 97.9 F (36.6 C) (Oral)   Ht 5\' 4"  (1.626 m)   Wt 208 lb (94.3 kg)   SpO2 95%   BMI 35.70 kg/m   BP Readings from Last 3 Encounters:  12/18/23 108/68  12/12/23 120/68  12/09/23 122/78    Wt Readings from Last 3 Encounters:  12/18/23 208 lb (94.3 kg)  12/12/23 205 lb 6.4 oz (93.2 kg)  12/09/23 205 lb 6.4 oz  (93.2 kg)    Physical Exam Constitutional:      General: She is not in acute distress.    Appearance: She is well-developed. She is obese.  HENT:     Head: Normocephalic.     Right Ear: External ear normal.     Left Ear: External ear normal.     Nose: Nose normal.  Eyes:     General:        Right eye: No discharge.        Left eye: No discharge.     Conjunctiva/sclera: Conjunctivae normal.     Pupils: Pupils are equal, round, and reactive to light.  Neck:     Thyroid : No thyromegaly.     Vascular: No JVD.     Trachea: No tracheal deviation.  Cardiovascular:     Rate and Rhythm: Normal rate and regular rhythm.     Heart sounds: Normal heart sounds.  Pulmonary:     Effort: No respiratory distress.     Breath sounds: No stridor. No wheezing.  Abdominal:     General: Bowel sounds are normal. There is no distension.     Palpations: Abdomen is soft. There is no mass.     Tenderness: There is no abdominal tenderness. There is no guarding or rebound.  Musculoskeletal:        General: No tenderness.     Cervical back: Normal range of motion and neck supple. No rigidity.     Right lower leg: Edema present.     Left lower leg: Edema present.  Lymphadenopathy:     Cervical: No cervical adenopathy.  Skin:    Findings: Lesion present. No erythema or rash.  Neurological:     Cranial Nerves: No cranial nerve deficit.     Motor: No abnormal muscle tone.     Coordination: Coordination normal.     Deep Tendon Reflexes: Reflexes normal.  Psychiatric:        Behavior: Behavior normal.        Thought Content: Thought content normal.        Judgment: Judgment normal.   Trace edema B LEs No bone pain No erythema  Lab Results  Component Value Date   WBC 4.3 12/17/2023   HGB 9.9 (L) 12/17/2023   HCT 31.0 (L) 12/17/2023   PLT 155.0 12/17/2023   GLUCOSE 143 (H) 12/17/2023   CHOL 129 12/17/2023   TRIG 78.0 12/17/2023   HDL 59.20 12/17/2023   LDLDIRECT 107.5 07/21/2013   LDLCALC  54 12/17/2023   ALT 14 12/17/2023   AST 22 12/17/2023   NA 137 12/17/2023   K 3.9 12/17/2023   CL 99 12/17/2023   CREATININE  1.12 12/17/2023   BUN 33 (H) 12/17/2023   CO2 31 12/17/2023   TSH 1.42 12/17/2023   INR 1.1 (H) 06/16/2023   HGBA1C 7.5 (H) 12/17/2023   MICROALBUR 2.4 (H) 12/17/2023    CT LIVER ABDOMEN W WO CONTRAST Result Date: 10/15/2023 CLINICAL DATA:  Cirrhosis EXAM: CT ABDOMEN WITHOUT AND WITH CONTRAST TECHNIQUE: Multidetector CT imaging of the abdomen was performed following the standard protocol before and following the bolus administration of intravenous contrast. RADIATION DOSE REDUCTION: This exam was performed according to the departmental dose-optimization program which includes automated exposure control, adjustment of the mA and/or kV according to patient size and/or use of iterative reconstruction technique. CONTRAST:  OMNIPAQUE  IOHEXOL  300 MG/ML  SOLN COMPARISON:  MRI abdomen dated 10/15/2017. FINDINGS: Lower chest: Lung bases are clear. Hepatobiliary: Nodular hepatic contour, reflecting cirrhosis. No suspicious/enhancing hepatic lesions. Multiple layering small gallstones (series 7/image 40), without associated inflammatory changes. No intrahepatic or extrahepatic ductal dilatation. Pancreas: Within normal limits. Spleen: Within normal limits. Adrenals/Urinary Tract: Adrenal glands are within normal limits. Kidneys are within normal limits. No renal calculi or hydronephrosis. Stomach/Bowel: Stomach is within normal limits. Visualized bowel is unremarkable. Vascular/Lymphatic: No evidence of abdominal aortic aneurysm. Atherosclerotic calcifications of the abdominal aorta and branch vessels, although vessels remain patent. Splenorenal shunt.  Portal vein is patent. Other: No abdominal ascites. Musculoskeletal: Thoracolumbar spine fixation hardware, incompletely visualized. Degenerative changes of the visualized thoracolumbar spine. IMPRESSION: Cirrhosis. No findings  suspicious for HCC. Portal vein is patent.  Splenorenal shunt.  No abdominal ascites. Cholelithiasis, without associated inflammatory changes. Electronically Signed   By: Zadie Herter M.D.   On: 10/15/2023 22:53    Assessment & Plan:   Problem List Items Addressed This Visit     Diabetes (HCC)   Discussed the risk of foot  infection      Open toe wound - Primary   New Bactroban qd Doxy x 5 days just in case Bandaid No signs of fx or infection RTC 7-10 d         Meds ordered this encounter  Medications   doxycycline (VIBRA-TABS) 100 MG tablet    Sig: Take 1 tablet (100 mg total) by mouth 2 (two) times daily.    Dispense:  10 tablet    Refill:  1   mupirocin ointment (BACTROBAN) 2 %    Sig: On leg wound w/dressing change qd or bid    Dispense:  30 g    Refill:  0      Follow-up: Return for f/u with PCP.  Anitra Barn, MD

## 2023-12-18 NOTE — Assessment & Plan Note (Signed)
 New Bactroban qd Doxy x 5 days just in case Bandaid No signs of fx or infection RTC 7-10 d

## 2023-12-30 ENCOUNTER — Ambulatory Visit: Payer: Medicare HMO | Admitting: Internal Medicine

## 2023-12-30 ENCOUNTER — Encounter: Payer: Self-pay | Admitting: Internal Medicine

## 2023-12-30 ENCOUNTER — Ambulatory Visit (INDEPENDENT_AMBULATORY_CARE_PROVIDER_SITE_OTHER)

## 2023-12-30 VITALS — BP 128/76 | HR 78 | Temp 98.2°F | Ht 64.0 in | Wt 202.0 lb

## 2023-12-30 DIAGNOSIS — E1165 Type 2 diabetes mellitus with hyperglycemia: Secondary | ICD-10-CM | POA: Diagnosis not present

## 2023-12-30 DIAGNOSIS — Z0001 Encounter for general adult medical examination with abnormal findings: Secondary | ICD-10-CM

## 2023-12-30 DIAGNOSIS — K754 Autoimmune hepatitis: Secondary | ICD-10-CM | POA: Diagnosis not present

## 2023-12-30 DIAGNOSIS — Z23 Encounter for immunization: Secondary | ICD-10-CM

## 2023-12-30 DIAGNOSIS — I10 Essential (primary) hypertension: Secondary | ICD-10-CM | POA: Diagnosis not present

## 2023-12-30 DIAGNOSIS — N1831 Chronic kidney disease, stage 3a: Secondary | ICD-10-CM | POA: Diagnosis not present

## 2023-12-30 DIAGNOSIS — E538 Deficiency of other specified B group vitamins: Secondary | ICD-10-CM

## 2023-12-30 DIAGNOSIS — E559 Vitamin D deficiency, unspecified: Secondary | ICD-10-CM | POA: Diagnosis not present

## 2023-12-30 DIAGNOSIS — Z0000 Encounter for general adult medical examination without abnormal findings: Secondary | ICD-10-CM | POA: Diagnosis not present

## 2023-12-30 DIAGNOSIS — Z7984 Long term (current) use of oral hypoglycemic drugs: Secondary | ICD-10-CM

## 2023-12-30 DIAGNOSIS — E7800 Pure hypercholesterolemia, unspecified: Secondary | ICD-10-CM | POA: Diagnosis not present

## 2023-12-30 DIAGNOSIS — R21 Rash and other nonspecific skin eruption: Secondary | ICD-10-CM

## 2023-12-30 DIAGNOSIS — S99922A Unspecified injury of left foot, initial encounter: Secondary | ICD-10-CM | POA: Diagnosis not present

## 2023-12-30 DIAGNOSIS — M79675 Pain in left toe(s): Secondary | ICD-10-CM

## 2023-12-30 DIAGNOSIS — M19072 Primary osteoarthritis, left ankle and foot: Secondary | ICD-10-CM | POA: Diagnosis not present

## 2023-12-30 NOTE — Patient Instructions (Addendum)
 You had the Hep A and Hep B shots today; please return for your other shots as recommended  Please continue all other medications as before, and refills have been done if requested.  Please have the pharmacy call with any other refills you may need.  Please continue your efforts at being more active, low cholesterol diet, and weight control.  You are otherwise up to date with prevention measures today.  Please keep your appointments with your specialists as you may have planned  You will be contacted regarding the referral for: [facility]  Please go to the XRAY Department in the first floor for the x-ray testing  You will be contacted by phone if any changes need to be made immediately.  Otherwise, you will receive a letter about your results with an explanation, but please check with MyChart first.  Your June 4 lab testing was stable  Please make an Appointment to return in 6 months, or sooner if needed, also with Lab Appointment for testing done 3-5 days before at the FIRST FLOOR Lab (so this is for TWO appointments - please see the scheduling desk as you leave)

## 2023-12-30 NOTE — Progress Notes (Signed)
 Patient ID: Kelsey Keller, female   DOB: 1941/06/15, 83 y.o.   MRN: [id number]         Chief Complaint:: wellness exam and 2nd toe left foot pain, bilateral leg rash, low vit d, hld, htn, dm, ckd3a, b12 deficiency       HPI:  Kelsey Keller is a 83 y.o. female here for wellness exam; up to date                        Also hit toe with blister infected now improved with doxycylcine , pt had deferred film but ok to do today due to persistent pain and swelling.  . Now on xarelto  20 every day with hx of afib likely onoing since her last surgury., due for f/u at 6 mo with cardiology.   Was advised also to have hep A and B vax's due to recent diagnosis liver disease.   Also has an unusual splotchy rash several lesions occur to both legs in the past wk, slight itching and no pain or fever, asking for derm referrral.  Pt denies chest pain, increased sob or doe, wheezing, orthopnea, PND, increased LE swelling, palpitations, dizziness or syncope.   Pt denies polydipsia, polyuria, or new focal neuro s/s.      Wt Readings from Last 3 Encounters:  12/30/23 202 lb (91.6 kg)  12/18/23 208 lb (94.3 kg)  12/12/23 205 lb 6.4 oz (93.2 kg)   BP Readings from Last 3 Encounters:  12/30/23 128/76  12/18/23 108/68  12/12/23 120/68   Immunization History  Administered Date(s) Administered   Fluad Quad(high Dose 65+) 03/26/2021   Hep A / Hep B 12/30/2023   Influenza, High Dose Seasonal PF 04/15/2018, 03/20/2019, 04/10/2020, 03/26/2021, 03/26/2022, 02/24/2023   Influenza-Unspecified 05/05/2017, 04/15/2018   Moderna Covid-19 Fall Seasonal Vaccine 27yrs & older 02/24/2023   PFIZER(Purple Top)SARS-COV-2 Vaccination 07/30/2019, 08/20/2019, 04/17/2020   PNEUMOCOCCAL CONJUGATE-20 12/11/2021   Pfizer Covid-19 Vaccine Bivalent Booster 85yrs & up 05/16/2021   Pneumococcal Conjugate-13 04/15/2013   Pneumococcal Polysaccharide-23 04/27/2008   Rsv, Bivalent, Protein Subunit Rsvpref,pf Marlow) 07/17/2022   Td  10/25/2008   Td (Adult), 2 Lf Tetanus Toxid, Preservative Free 10/25/2008   Tdap 01/26/2019   Unspecified SARS-COV-2 Vaccination 05/07/2022   Zoster Recombinant(Shingrix) 03/04/2022, 05/07/2022   Zoster, Live 10/20/2007   Health Maintenance Due  Topic Date Due   COVID-19 Vaccine (7 - 2024-25 season) 04/21/2023      Past Medical History:  Diagnosis Date   ALLERGIC RHINITIS 04/29/2007   Allergy    Basal cell carcinoma 03/24/2018   sclerosis- upper bridge of nose (MOHS)   BCC (basal cell carcinoma) 10/18/2019   right supra orbital region- Cx3 77fu   Cataract    both eyes   DIABETES MELLITUS, TYPE II 06/23/2007   type 2   Elevated liver enzymes 03/2017   Elevated tumor markers    GERD (gastroesophageal reflux disease)    Heart murmur    per patient   HIATAL HERNIA, HX OF 04/29/2007   History of hiatal hernia    HYPERLIPIDEMIA 04/29/2007   diet controlled on CoQ10   HYPERTENSION 04/29/2007   HYPOKALEMIA 05/02/2009   HYPOTHYROIDISM 04/29/2007   LOW BACK PAIN 04/29/2007   Neuromuscular disorder (HCC)    diabetic neuropathy in both legs   OSTEOARTHRITIS, KNEE, LEFT 06/23/2007   OSTEOPOROSIS NOS 05/12/2007   SPINAL STENOSIS, LUMBAR 04/29/2007   Past Surgical History:  Procedure Laterality Date   BACK SURGERY  N/A 2014 x 2   T1 TO S 1    BACK SURGERY     CATARACT EXTRACTION W/ INTRAOCULAR LENS  IMPLANT, BILATERAL Bilateral 05/2011   doing well   COLONOSCOPY WITH PROPOFOL  N/A 04/10/2017   Procedure: COLONOSCOPY WITH PROPOFOL ;  Surgeon: Kelsey Toribio SQUIBB, MD;  Location: [facility];  Service: Endoscopy;  Laterality: N/A;   COLONSCOPY  2011   JOINT REPLACEMENT     TOTAL KNEE ARTHROPLASTY  2006   right   TOTAL KNEE ARTHROPLASTY Left 2012    reports that she has never smoked. She has never used smokeless tobacco. She reports current alcohol  use. She reports that she does not use drugs. family history includes Arthritis in her brother and mother; COPD in her brother; Colon  polyps in an other family member; Coronary artery disease in her father and another family member; Diabetes in an other family member; Hyperlipidemia in her brother; Hypertension in her mother; Stroke in her brother. No Known Allergies Current Outpatient Medications on File Prior to Visit  Medication Sig Dispense Refill   atenolol  (TENORMIN ) 50 MG tablet Take 1 tablet (50 mg total) by mouth daily. 90 tablet 3   blood glucose meter kit and supplies KIT Dispense with patient  Insurance; Use up to four times daily as directed. E11.9 1 each 0   Blood Glucose Monitoring Suppl (ONE TOUCH ULTRA 2) w/Device KIT Use as directed up to 4 times per day E11.9 1 each 0   Cholecalciferol (VITAMIN D3) 50 MCG (2000 UT) TABS Take 2,000 Units by mouth daily.     Co-Enzyme Q-10 100 MG CAPS Take 100 mg by mouth daily.     cyanocobalamin  1000 MCG tablet Take 1,000 mcg by mouth daily.     dapagliflozin  propanediol (FARXIGA ) 10 MG TABS tablet Take 1 tablet (10 mg total) by mouth daily before breakfast. 90 tablet 3   fluticasone  (FLONASE ) 50 MCG/ACT nasal spray SPRAY 2 SPRAYS INTO EACH NOSTRIL EVERY DAY (Patient taking differently: Place 2 sprays into both nostrils daily as needed for allergies.) 48 mL 3   glucose blood (ONETOUCH VERIO) test strip USE TO CHECK BLOOD SUGARS TWICE A DAY 200 strip 0   hydrochlorothiazide  (HYDRODIURIL ) 25 MG tablet Take 1 tablet (25 mg total) by mouth daily. 90 tablet 3   Lancets MISC Use to check blood sugars twice a day 200 each 0   latanoprost (XALATAN) 0.005 % ophthalmic solution SMARTSIG:1 Drop(s) In Eye(s) Every Evening     levothyroxine  (SYNTHROID ) 125 MCG tablet TAKE 1 TABLET BY MOUTH EVERY DAY BEFORE BREAKFAST 90 tablet 3   Magnesium  250 MG TABS Take 250 mg by mouth daily.     meloxicam  (MOBIC ) 7.5 MG tablet TAKE 1 TABLET BY MOUTH EVERY DAY AS NEEDED FOR PAIN 90 tablet 1   Menthol , Topical Analgesic, (BENGAY EX) Apply 1 Application topically daily as needed (arthritis pain).      metFORMIN  (GLUCOPHAGE ) 1000 MG tablet Take 1 tablet (1,000 mg total) by mouth 2 (two) times daily with a meal. 180 tablet 3   mupirocin  ointment (BACTROBAN ) 2 % On leg wound w/dressing change qd or bid 30 g 0   omeprazole  (PRILOSEC) 40 MG capsule Take 1 capsule (40 mg total) by mouth in the morning and at bedtime. Twice a day for 2 months, then decrease to once daily. If started on blood thinners continue twice daily. 120 capsule 5   pioglitazone  (ACTOS ) 30 MG tablet Take 1 tablet (30 mg total) by mouth daily.  90 tablet 3   Red Yeast Rice Extract 600 MG CAPS Take 600 mg by mouth 2 (two) times daily.     rivaroxaban  (XARELTO ) 20 MG TABS tablet Take 1 tablet (20 mg total) by mouth daily with supper. 90 tablet 3   traMADol  (ULTRAM ) 50 MG tablet Take 1 tablet (50 mg total) by mouth every 8 (eight) hours as needed. 90 tablet 5   No current facility-administered medications on file prior to visit.        ROS:  All others reviewed and negative.  Objective        PE:  BP 128/76 (BP Location: Right Arm, Patient Position: Sitting, Cuff Size: Normal)   Pulse 78   Temp 98.2 F (36.8 C) (Oral)   Ht 5' 4 (1.626 m)   Wt 202 lb (91.6 kg)   SpO2 98%   BMI 34.67 kg/m                 Constitutional: Pt appears in NAD               HENT: Head: NCAT.                Right Ear: External ear normal.                 Left Ear: External ear normal.                Eyes: . Pupils are equal, round, and reactive to light. Conjunctivae and EOM are normal               Nose: without d/c or deformity               Neck: Neck supple. Gross normal ROM               Cardiovascular: Normal rate and regular rhythm.                 Pulmonary/Chest: Effort normal and breath sounds without rales or wheezing.                Abd:  Soft, NT, ND, + BS, no organomegaly               Neurological: Pt is alert. At baseline orientation, motor grossly intact               Skin: Skin is warm. , LE edema - none, bilat Les with  splotchy nontender rash erythematous non raised;   2nd to e left foot with 1-2+ swelling, tender               Psychiatric: Pt behavior is normal without agitation   Micro: none  Cardiac tracings I have personally interpreted today:  none  Pertinent Radiological findings (summarize): none   Lab Results  Component Value Date   WBC 4.3 12/17/2023   HGB 9.9 (L) 12/17/2023   HCT 31.0 (L) 12/17/2023   PLT 155.0 12/17/2023   GLUCOSE 143 (H) 12/17/2023   CHOL 129 12/17/2023   TRIG 78.0 12/17/2023   HDL 59.20 12/17/2023   LDLDIRECT 107.5 07/21/2013   LDLCALC 54 12/17/2023   ALT 14 12/17/2023   AST 22 12/17/2023   NA 137 12/17/2023   K 3.9 12/17/2023   CL 99 12/17/2023   CREATININE 1.12 12/17/2023   BUN 33 (H) 12/17/2023   CO2 31 12/17/2023   TSH 1.42 12/17/2023   INR 1.1 (H) 06/16/2023   HGBA1C 7.5 (H) 12/17/2023  MICROALBUR 2.4 (H) 12/17/2023   Assessment/Plan:  Kelsey Keller is a 83 y.o. White or Caucasian [1] female with  has a past medical history of ALLERGIC RHINITIS (04/29/2007), Allergy, Basal cell carcinoma (03/24/2018), BCC (basal cell carcinoma) (10/18/2019), Cataract, DIABETES MELLITUS, TYPE II (06/23/2007), Elevated liver enzymes (03/2017), Elevated tumor markers, GERD (gastroesophageal reflux disease), Heart murmur, HIATAL HERNIA, HX OF (04/29/2007), History of hiatal hernia, HYPERLIPIDEMIA (04/29/2007), HYPERTENSION (04/29/2007), HYPOKALEMIA (05/02/2009), HYPOTHYROIDISM (04/29/2007), LOW BACK PAIN (04/29/2007), Neuromuscular disorder (HCC), OSTEOARTHRITIS, KNEE, LEFT (06/23/2007), OSTEOPOROSIS NOS (05/12/2007), and SPINAL STENOSIS, LUMBAR (04/29/2007).  Autoimmune hepatitis (HCC) Pt to start hep a/hep b vax series  Encounter for well adult exam with abnormal findings Age and sex appropriate education and counseling updated with regular exercise and diet Referrals for preventative services - none needed Immunizations addressed - for start hep A and hep B vax  series Smoking counseling  - none needed Evidence for depression or other mood disorder - none significant Most recent labs reviewed. I have personally reviewed and have noted: 1) the patient's medical and social history 2) The patient's current medications and supplements 3) The patient's height, weight, and BMI have been recorded in the chart   Vitamin D  deficiency Last vitamin D  Lab Results  Component Value Date   VD25OH 39.53 12/17/2023   Low, to start oral replacement   Hyperlipidemia Lab Results  Component Value Date   LDLCALC 54 12/17/2023   Stable, pt to continue current statin  -  diet, wt control   Essential hypertension BP Readings from Last 3 Encounters:  12/30/23 128/76  12/18/23 108/68  12/12/23 120/68   Stable, pt to continue medical treatment tenormin  50 every day, hct 25 qd   Diabetes Lab Results  Component Value Date   HGBA1C 7.5 (H) 12/17/2023   Stable, pt to continue current medical treatment farxiga  10 every day, actos  30 every day, metformin  1000 bid   CKD (chronic kidney disease) stage 3, GFR 30-59 ml/min (HCC) Lab Results  Component Value Date   CREATININE 1.12 12/17/2023   Stable overall, cont to avoid nephrotoxins   B12 deficiency Lab Results  Component Value Date   VITAMINB12 473 12/17/2023   Stable, cont oral replacement - b12 1000 mcg qd   Rash Etiology unclear, for referral dermatology dr hall  Toe pain, left S/p trauma x 2 wks, remains pain and swelling - for left toe xray  Followup: Return in about 6 months (around 06/30/2024).  Lynwood Rush, MD 01/01/2024 8:16 PM [facility]

## 2024-01-01 ENCOUNTER — Encounter: Payer: Self-pay | Admitting: Internal Medicine

## 2024-01-01 DIAGNOSIS — M79675 Pain in left toe(s): Secondary | ICD-10-CM | POA: Insufficient documentation

## 2024-01-01 DIAGNOSIS — R21 Rash and other nonspecific skin eruption: Secondary | ICD-10-CM | POA: Insufficient documentation

## 2024-01-01 NOTE — Assessment & Plan Note (Signed)
 Etiology unclear, for referral dermatology dr hall

## 2024-01-01 NOTE — Assessment & Plan Note (Signed)
 Lab Results  Component Value Date   CREATININE 1.12 12/17/2023   Stable overall, cont to avoid nephrotoxins

## 2024-01-01 NOTE — Assessment & Plan Note (Signed)
 S/p trauma x 2 wks, remains pain and swelling - for left toe xray

## 2024-01-01 NOTE — Assessment & Plan Note (Signed)
 Lab Results  Component Value Date   HGBA1C 7.5 (H) 12/17/2023   Stable, pt to continue current medical treatment farxiga  10 every day, actos  30 every day, metformin  1000 bid

## 2024-01-01 NOTE — Assessment & Plan Note (Signed)
 Lab Results  Component Value Date   LDLCALC 54 12/17/2023   Stable, pt to continue current statin  -  diet, wt control

## 2024-01-01 NOTE — Assessment & Plan Note (Signed)
 Age and sex appropriate education and counseling updated with regular exercise and diet Referrals for preventative services - none needed Immunizations addressed - for start hep A and hep B vax series Smoking counseling  - none needed Evidence for depression or other mood disorder - none significant Most recent labs reviewed. I have personally reviewed and have noted: 1) the patient's medical and social history 2) The patient's current medications and supplements 3) The patient's height, weight, and BMI have been recorded in the chart

## 2024-01-01 NOTE — Assessment & Plan Note (Signed)
 BP Readings from Last 3 Encounters:  12/30/23 128/76  12/18/23 108/68  12/12/23 120/68   Stable, pt to continue medical treatment tenormin  50 every day, hct 25 qd

## 2024-01-01 NOTE — Assessment & Plan Note (Signed)
 Pt to start hep a/hep b vax series

## 2024-01-01 NOTE — Assessment & Plan Note (Signed)
 Last vitamin D  Lab Results  Component Value Date   VD25OH 39.53 12/17/2023   Low, to start oral replacement

## 2024-01-01 NOTE — Assessment & Plan Note (Signed)
 Lab Results  Component Value Date   VITAMINB12 473 12/17/2023   Stable, cont oral replacement - b12 1000 mcg qd

## 2024-01-03 ENCOUNTER — Other Ambulatory Visit: Payer: Self-pay | Admitting: Internal Medicine

## 2024-01-05 ENCOUNTER — Ambulatory Visit: Payer: Self-pay | Admitting: Internal Medicine

## 2024-01-05 ENCOUNTER — Other Ambulatory Visit: Payer: Self-pay

## 2024-01-06 DIAGNOSIS — G5703 Lesion of sciatic nerve, bilateral lower limbs: Secondary | ICD-10-CM | POA: Diagnosis not present

## 2024-01-08 ENCOUNTER — Telehealth: Payer: Self-pay | Admitting: Internal Medicine

## 2024-01-08 MED ORDER — PIOGLITAZONE HCL 30 MG PO TABS: 30.0000 mg | ORAL_TABLET | Freq: Every day | ORAL | 3 refills | Status: DC

## 2024-01-08 MED ORDER — ATENOLOL 50 MG PO TABS: 50.0000 mg | ORAL_TABLET | Freq: Every day | ORAL | 3 refills | Status: AC

## 2024-01-08 MED ORDER — TRAMADOL HCL 50 MG PO TABS: 50.0000 mg | ORAL_TABLET | Freq: Three times a day (TID) | ORAL | 5 refills | Status: DC | PRN

## 2024-01-08 MED ORDER — MELOXICAM 7.5 MG PO TABS: ORAL_TABLET | ORAL | 1 refills | Status: DC

## 2024-01-08 MED ORDER — HYDROCHLOROTHIAZIDE 25 MG PO TABS: 25.0000 mg | ORAL_TABLET | Freq: Every day | ORAL | 3 refills | Status: DC

## 2024-01-08 MED ORDER — METFORMIN HCL 1000 MG PO TABS: 1000.0000 mg | ORAL_TABLET | Freq: Two times a day (BID) | ORAL | 3 refills | Status: DC

## 2024-01-08 MED ORDER — OMEPRAZOLE 40 MG PO CPDR: 40.0000 mg | DELAYED_RELEASE_CAPSULE | Freq: Two times a day (BID) | ORAL | 5 refills | Status: DC

## 2024-01-08 MED ORDER — DAPAGLIFLOZIN PROPANEDIOL 10 MG PO TABS: 10.0000 mg | ORAL_TABLET | Freq: Every day | ORAL | 3 refills | Status: AC

## 2024-01-08 NOTE — Telephone Encounter (Signed)
 Copied from CRM [id number]. Topic: Clinical - Medication Refill >> Jan 08, 2024  9:30 AM Donna E wrote: Medication:  -meloxicam  (MOBIC ) 7.5 MG tablet -omeprazole  (PRILOSEC) 40 MG capsule  -pioglitazone  (ACTOS ) 30 MG tablet -metFORMIN  (GLUCOPHAGE ) 1000 MG tablet -atenolol  (TENORMIN ) 50 MG tablet -hydrochlorothiazide  (HYDRODIURIL ) 25 MG tablet  -traMADol  (ULTRAM ) 50 MG tablet - this has been discontinued please call patient regarding this.  -dapagliflozin  propanediol (FARXIGA ) 10 MG TABS tablet - this has been discontinued please call patient regarding this.  Has the patient contacted their pharmacy? No Patient spoke to Dr Norleen about these medication during last office visit  This is the patient's preferred pharmacy:   [id number] - [place], [place] - [address] AT [address] OF [facility] [address] [place] [address] Phone: [phone] Fax: [phone]   Is this the correct pharmacy for this prescription? Yes If no, delete pharmacy and type the correct one.   Has the prescription been filled recently? Yes  Is the patient out of the medication? No  Has the patient been seen for an appointment in the last year OR does the patient have an upcoming appointment? Yes  Can we respond through MyChart? Yes  Agent: Please be advised that Rx refills may take up to 3 business days. We ask that you follow-up with your pharmacy.  Patient phone [phone]

## 2024-01-13 DIAGNOSIS — H401131 Primary open-angle glaucoma, bilateral, mild stage: Secondary | ICD-10-CM | POA: Diagnosis not present

## 2024-02-02 DIAGNOSIS — M9901 Segmental and somatic dysfunction of cervical region: Secondary | ICD-10-CM | POA: Diagnosis not present

## 2024-02-02 DIAGNOSIS — M9902 Segmental and somatic dysfunction of thoracic region: Secondary | ICD-10-CM | POA: Diagnosis not present

## 2024-02-02 DIAGNOSIS — M9904 Segmental and somatic dysfunction of sacral region: Secondary | ICD-10-CM | POA: Diagnosis not present

## 2024-02-02 DIAGNOSIS — M9903 Segmental and somatic dysfunction of lumbar region: Secondary | ICD-10-CM | POA: Diagnosis not present

## 2024-02-03 DIAGNOSIS — L821 Other seborrheic keratosis: Secondary | ICD-10-CM | POA: Diagnosis not present

## 2024-02-03 DIAGNOSIS — L818 Other specified disorders of pigmentation: Secondary | ICD-10-CM | POA: Diagnosis not present

## 2024-02-03 DIAGNOSIS — Z1283 Encounter for screening for malignant neoplasm of skin: Secondary | ICD-10-CM | POA: Diagnosis not present

## 2024-02-03 DIAGNOSIS — I872 Venous insufficiency (chronic) (peripheral): Secondary | ICD-10-CM | POA: Diagnosis not present

## 2024-02-03 DIAGNOSIS — D485 Neoplasm of uncertain behavior of skin: Secondary | ICD-10-CM | POA: Diagnosis not present

## 2024-02-03 DIAGNOSIS — D225 Melanocytic nevi of trunk: Secondary | ICD-10-CM | POA: Diagnosis not present

## 2024-02-04 DIAGNOSIS — M9901 Segmental and somatic dysfunction of cervical region: Secondary | ICD-10-CM | POA: Diagnosis not present

## 2024-02-04 DIAGNOSIS — M9904 Segmental and somatic dysfunction of sacral region: Secondary | ICD-10-CM | POA: Diagnosis not present

## 2024-02-04 DIAGNOSIS — M9903 Segmental and somatic dysfunction of lumbar region: Secondary | ICD-10-CM | POA: Diagnosis not present

## 2024-02-04 DIAGNOSIS — M9902 Segmental and somatic dysfunction of thoracic region: Secondary | ICD-10-CM | POA: Diagnosis not present

## 2024-02-09 DIAGNOSIS — M5134 Other intervertebral disc degeneration, thoracic region: Secondary | ICD-10-CM | POA: Diagnosis not present

## 2024-02-09 DIAGNOSIS — M5451 Vertebrogenic low back pain: Secondary | ICD-10-CM | POA: Diagnosis not present

## 2024-02-09 DIAGNOSIS — M62830 Muscle spasm of back: Secondary | ICD-10-CM | POA: Diagnosis not present

## 2024-02-09 DIAGNOSIS — M5135 Other intervertebral disc degeneration, thoracolumbar region: Secondary | ICD-10-CM | POA: Diagnosis not present

## 2024-02-09 DIAGNOSIS — M546 Pain in thoracic spine: Secondary | ICD-10-CM | POA: Diagnosis not present

## 2024-02-09 DIAGNOSIS — M9901 Segmental and somatic dysfunction of cervical region: Secondary | ICD-10-CM | POA: Diagnosis not present

## 2024-02-09 DIAGNOSIS — M9903 Segmental and somatic dysfunction of lumbar region: Secondary | ICD-10-CM | POA: Diagnosis not present

## 2024-02-09 DIAGNOSIS — M9902 Segmental and somatic dysfunction of thoracic region: Secondary | ICD-10-CM | POA: Diagnosis not present

## 2024-02-09 DIAGNOSIS — M542 Cervicalgia: Secondary | ICD-10-CM | POA: Diagnosis not present

## 2024-02-09 DIAGNOSIS — M51362 Other intervertebral disc degeneration, lumbar region with discogenic back pain and lower extremity pain: Secondary | ICD-10-CM | POA: Diagnosis not present

## 2024-02-09 DIAGNOSIS — M9904 Segmental and somatic dysfunction of sacral region: Secondary | ICD-10-CM | POA: Diagnosis not present

## 2024-02-12 DIAGNOSIS — M9902 Segmental and somatic dysfunction of thoracic region: Secondary | ICD-10-CM | POA: Diagnosis not present

## 2024-02-12 DIAGNOSIS — M9901 Segmental and somatic dysfunction of cervical region: Secondary | ICD-10-CM | POA: Diagnosis not present

## 2024-02-12 DIAGNOSIS — M542 Cervicalgia: Secondary | ICD-10-CM | POA: Diagnosis not present

## 2024-02-12 DIAGNOSIS — M5134 Other intervertebral disc degeneration, thoracic region: Secondary | ICD-10-CM | POA: Diagnosis not present

## 2024-02-12 DIAGNOSIS — M9904 Segmental and somatic dysfunction of sacral region: Secondary | ICD-10-CM | POA: Diagnosis not present

## 2024-02-12 DIAGNOSIS — M9903 Segmental and somatic dysfunction of lumbar region: Secondary | ICD-10-CM | POA: Diagnosis not present

## 2024-02-12 DIAGNOSIS — M62830 Muscle spasm of back: Secondary | ICD-10-CM | POA: Diagnosis not present

## 2024-02-12 DIAGNOSIS — M5135 Other intervertebral disc degeneration, thoracolumbar region: Secondary | ICD-10-CM | POA: Diagnosis not present

## 2024-02-12 DIAGNOSIS — M51362 Other intervertebral disc degeneration, lumbar region with discogenic back pain and lower extremity pain: Secondary | ICD-10-CM | POA: Diagnosis not present

## 2024-02-12 DIAGNOSIS — M5451 Vertebrogenic low back pain: Secondary | ICD-10-CM | POA: Diagnosis not present

## 2024-02-12 DIAGNOSIS — M546 Pain in thoracic spine: Secondary | ICD-10-CM | POA: Diagnosis not present

## 2024-02-13 ENCOUNTER — Telehealth: Payer: Self-pay

## 2024-02-13 ENCOUNTER — Other Ambulatory Visit (HOSPITAL_COMMUNITY): Payer: Self-pay

## 2024-02-13 NOTE — Telephone Encounter (Signed)
 Pharmacy Patient Advocate Encounter   Received notification from Pt Calls Messages that prior authorization for Tramadol  50mg  tabs is required/requested.   Insurance verification completed.   The patient is insured through [facility] ADVANTAGE/RX ADVANCE .   Per test claim: The current 30 day co-pay is, $0.  No PA needed at this time. This test claim was processed through [facility]- copay amounts may vary at other pharmacies due to pharmacy/plan contracts, or as the patient moves through the different stages of their insurance plan.

## 2024-02-13 NOTE — Telephone Encounter (Signed)
 Good afternoon!   We had received a fax for patient's Tramadol  from [facility]. They are requiring a prior authorization on that med before approving more than a 7 day fill. Can you start the process for this?

## 2024-02-23 DIAGNOSIS — M9901 Segmental and somatic dysfunction of cervical region: Secondary | ICD-10-CM | POA: Diagnosis not present

## 2024-02-23 DIAGNOSIS — M9902 Segmental and somatic dysfunction of thoracic region: Secondary | ICD-10-CM | POA: Diagnosis not present

## 2024-02-23 DIAGNOSIS — M9904 Segmental and somatic dysfunction of sacral region: Secondary | ICD-10-CM | POA: Diagnosis not present

## 2024-02-23 DIAGNOSIS — M9903 Segmental and somatic dysfunction of lumbar region: Secondary | ICD-10-CM | POA: Diagnosis not present

## 2024-02-24 DIAGNOSIS — E119 Type 2 diabetes mellitus without complications: Secondary | ICD-10-CM | POA: Diagnosis not present

## 2024-02-24 LAB — HM DIABETES EYE EXAM

## 2024-02-26 DIAGNOSIS — M9902 Segmental and somatic dysfunction of thoracic region: Secondary | ICD-10-CM | POA: Diagnosis not present

## 2024-02-26 DIAGNOSIS — M9904 Segmental and somatic dysfunction of sacral region: Secondary | ICD-10-CM | POA: Diagnosis not present

## 2024-02-26 DIAGNOSIS — M9903 Segmental and somatic dysfunction of lumbar region: Secondary | ICD-10-CM | POA: Diagnosis not present

## 2024-02-26 DIAGNOSIS — M9901 Segmental and somatic dysfunction of cervical region: Secondary | ICD-10-CM | POA: Diagnosis not present

## 2024-03-02 ENCOUNTER — Other Ambulatory Visit: Payer: Self-pay | Admitting: Internal Medicine

## 2024-03-02 DIAGNOSIS — M9902 Segmental and somatic dysfunction of thoracic region: Secondary | ICD-10-CM | POA: Diagnosis not present

## 2024-03-02 DIAGNOSIS — Z1231 Encounter for screening mammogram for malignant neoplasm of breast: Secondary | ICD-10-CM

## 2024-03-02 DIAGNOSIS — M9903 Segmental and somatic dysfunction of lumbar region: Secondary | ICD-10-CM | POA: Diagnosis not present

## 2024-03-02 DIAGNOSIS — M9901 Segmental and somatic dysfunction of cervical region: Secondary | ICD-10-CM | POA: Diagnosis not present

## 2024-03-02 DIAGNOSIS — M9904 Segmental and somatic dysfunction of sacral region: Secondary | ICD-10-CM | POA: Diagnosis not present

## 2024-03-09 DIAGNOSIS — M9901 Segmental and somatic dysfunction of cervical region: Secondary | ICD-10-CM | POA: Diagnosis not present

## 2024-03-09 DIAGNOSIS — M9903 Segmental and somatic dysfunction of lumbar region: Secondary | ICD-10-CM | POA: Diagnosis not present

## 2024-03-09 DIAGNOSIS — M9902 Segmental and somatic dysfunction of thoracic region: Secondary | ICD-10-CM | POA: Diagnosis not present

## 2024-03-09 DIAGNOSIS — M9904 Segmental and somatic dysfunction of sacral region: Secondary | ICD-10-CM | POA: Diagnosis not present

## 2024-03-11 DIAGNOSIS — M9901 Segmental and somatic dysfunction of cervical region: Secondary | ICD-10-CM | POA: Diagnosis not present

## 2024-03-11 DIAGNOSIS — M9904 Segmental and somatic dysfunction of sacral region: Secondary | ICD-10-CM | POA: Diagnosis not present

## 2024-03-11 DIAGNOSIS — M9903 Segmental and somatic dysfunction of lumbar region: Secondary | ICD-10-CM | POA: Diagnosis not present

## 2024-03-11 DIAGNOSIS — M9902 Segmental and somatic dysfunction of thoracic region: Secondary | ICD-10-CM | POA: Diagnosis not present

## 2024-03-16 DIAGNOSIS — M9904 Segmental and somatic dysfunction of sacral region: Secondary | ICD-10-CM | POA: Diagnosis not present

## 2024-03-16 DIAGNOSIS — L570 Actinic keratosis: Secondary | ICD-10-CM | POA: Diagnosis not present

## 2024-03-16 DIAGNOSIS — X32XXXA Exposure to sunlight, initial encounter: Secondary | ICD-10-CM | POA: Diagnosis not present

## 2024-03-16 DIAGNOSIS — M9902 Segmental and somatic dysfunction of thoracic region: Secondary | ICD-10-CM | POA: Diagnosis not present

## 2024-03-16 DIAGNOSIS — M9903 Segmental and somatic dysfunction of lumbar region: Secondary | ICD-10-CM | POA: Diagnosis not present

## 2024-03-16 DIAGNOSIS — L821 Other seborrheic keratosis: Secondary | ICD-10-CM | POA: Diagnosis not present

## 2024-03-16 DIAGNOSIS — M9901 Segmental and somatic dysfunction of cervical region: Secondary | ICD-10-CM | POA: Diagnosis not present

## 2024-03-16 DIAGNOSIS — I872 Venous insufficiency (chronic) (peripheral): Secondary | ICD-10-CM | POA: Diagnosis not present

## 2024-03-18 DIAGNOSIS — M9901 Segmental and somatic dysfunction of cervical region: Secondary | ICD-10-CM | POA: Diagnosis not present

## 2024-03-18 DIAGNOSIS — M9904 Segmental and somatic dysfunction of sacral region: Secondary | ICD-10-CM | POA: Diagnosis not present

## 2024-03-18 DIAGNOSIS — M9902 Segmental and somatic dysfunction of thoracic region: Secondary | ICD-10-CM | POA: Diagnosis not present

## 2024-03-18 DIAGNOSIS — M9903 Segmental and somatic dysfunction of lumbar region: Secondary | ICD-10-CM | POA: Diagnosis not present

## 2024-03-23 DIAGNOSIS — M9903 Segmental and somatic dysfunction of lumbar region: Secondary | ICD-10-CM | POA: Diagnosis not present

## 2024-03-23 DIAGNOSIS — M9904 Segmental and somatic dysfunction of sacral region: Secondary | ICD-10-CM | POA: Diagnosis not present

## 2024-03-23 DIAGNOSIS — M9902 Segmental and somatic dysfunction of thoracic region: Secondary | ICD-10-CM | POA: Diagnosis not present

## 2024-03-23 DIAGNOSIS — M9901 Segmental and somatic dysfunction of cervical region: Secondary | ICD-10-CM | POA: Diagnosis not present

## 2024-03-25 DIAGNOSIS — M9902 Segmental and somatic dysfunction of thoracic region: Secondary | ICD-10-CM | POA: Diagnosis not present

## 2024-03-25 DIAGNOSIS — M9904 Segmental and somatic dysfunction of sacral region: Secondary | ICD-10-CM | POA: Diagnosis not present

## 2024-03-25 DIAGNOSIS — M9901 Segmental and somatic dysfunction of cervical region: Secondary | ICD-10-CM | POA: Diagnosis not present

## 2024-03-25 DIAGNOSIS — M9903 Segmental and somatic dysfunction of lumbar region: Secondary | ICD-10-CM | POA: Diagnosis not present

## 2024-03-26 ENCOUNTER — Ambulatory Visit
Admission: RE | Admit: 2024-03-26 | Discharge: 2024-03-26 | Disposition: A | Discharge status: Home / Self Care | Source: Ambulatory Visit | Attending: Internal Medicine | Admitting: Internal Medicine

## 2024-03-26 DIAGNOSIS — Z1231 Encounter for screening mammogram for malignant neoplasm of breast: Secondary | ICD-10-CM | POA: Diagnosis not present

## 2024-03-30 DIAGNOSIS — M9903 Segmental and somatic dysfunction of lumbar region: Secondary | ICD-10-CM | POA: Diagnosis not present

## 2024-03-30 DIAGNOSIS — M9904 Segmental and somatic dysfunction of sacral region: Secondary | ICD-10-CM | POA: Diagnosis not present

## 2024-03-30 DIAGNOSIS — M9902 Segmental and somatic dysfunction of thoracic region: Secondary | ICD-10-CM | POA: Diagnosis not present

## 2024-03-30 DIAGNOSIS — M9901 Segmental and somatic dysfunction of cervical region: Secondary | ICD-10-CM | POA: Diagnosis not present

## 2024-04-01 ENCOUNTER — Ambulatory Visit

## 2024-04-01 DIAGNOSIS — Z23 Encounter for immunization: Secondary | ICD-10-CM

## 2024-04-01 NOTE — Progress Notes (Signed)
 Patient here for Hep A/ B vaccine. Patient tolerated well with no complications

## 2024-04-06 DIAGNOSIS — M9904 Segmental and somatic dysfunction of sacral region: Secondary | ICD-10-CM | POA: Diagnosis not present

## 2024-04-06 DIAGNOSIS — M9903 Segmental and somatic dysfunction of lumbar region: Secondary | ICD-10-CM | POA: Diagnosis not present

## 2024-04-06 DIAGNOSIS — M9901 Segmental and somatic dysfunction of cervical region: Secondary | ICD-10-CM | POA: Diagnosis not present

## 2024-04-06 DIAGNOSIS — M9902 Segmental and somatic dysfunction of thoracic region: Secondary | ICD-10-CM | POA: Diagnosis not present

## 2024-04-08 ENCOUNTER — Encounter: Payer: Self-pay | Admitting: Pharmacist

## 2024-04-08 NOTE — Progress Notes (Signed)
 Pharmacy Quality Measure Review  This patient is appearing on a report for being at risk of failing the adherence measure for diabetes medications this calendar year.   Medication: metformin  and pioglitazone  Last fill date: 04/03/24 for 90 day supply  Insurance report was not up to date. No action needed at this time.   Darrelyn Drum, PharmD, BCPS, CPP Clinical Pharmacist Practitioner [facility], [facility], [facility] [phone]

## 2024-04-13 DIAGNOSIS — M9904 Segmental and somatic dysfunction of sacral region: Secondary | ICD-10-CM | POA: Diagnosis not present

## 2024-04-13 DIAGNOSIS — M9901 Segmental and somatic dysfunction of cervical region: Secondary | ICD-10-CM | POA: Diagnosis not present

## 2024-04-13 DIAGNOSIS — M9902 Segmental and somatic dysfunction of thoracic region: Secondary | ICD-10-CM | POA: Diagnosis not present

## 2024-04-13 DIAGNOSIS — M9903 Segmental and somatic dysfunction of lumbar region: Secondary | ICD-10-CM | POA: Diagnosis not present

## 2024-04-16 NOTE — Telephone Encounter (Signed)
 Error

## 2024-04-20 DIAGNOSIS — M9903 Segmental and somatic dysfunction of lumbar region: Secondary | ICD-10-CM | POA: Diagnosis not present

## 2024-04-20 DIAGNOSIS — M9901 Segmental and somatic dysfunction of cervical region: Secondary | ICD-10-CM | POA: Diagnosis not present

## 2024-04-20 DIAGNOSIS — M9902 Segmental and somatic dysfunction of thoracic region: Secondary | ICD-10-CM | POA: Diagnosis not present

## 2024-04-20 DIAGNOSIS — M9904 Segmental and somatic dysfunction of sacral region: Secondary | ICD-10-CM | POA: Diagnosis not present

## 2024-04-27 DIAGNOSIS — M9902 Segmental and somatic dysfunction of thoracic region: Secondary | ICD-10-CM | POA: Diagnosis not present

## 2024-04-27 DIAGNOSIS — M9904 Segmental and somatic dysfunction of sacral region: Secondary | ICD-10-CM | POA: Diagnosis not present

## 2024-04-27 DIAGNOSIS — M9901 Segmental and somatic dysfunction of cervical region: Secondary | ICD-10-CM | POA: Diagnosis not present

## 2024-04-27 DIAGNOSIS — M9903 Segmental and somatic dysfunction of lumbar region: Secondary | ICD-10-CM | POA: Diagnosis not present

## 2024-04-29 ENCOUNTER — Telehealth: Payer: Self-pay

## 2024-04-29 DIAGNOSIS — K7460 Unspecified cirrhosis of liver: Secondary | ICD-10-CM

## 2024-04-29 NOTE — Telephone Encounter (Signed)
-----   Message from Nurse Caitlen Worth P sent at 10/29/2023  1:34 PM EDT -----  liver ultrasound for [facility] screening in 6 months recall and have her do a CBC/CMP/INR/AFP

## 2024-04-29 NOTE — Telephone Encounter (Signed)
 The pt has been advised and agrees to the US  and labs.  Orders entered and order for US  sent to the schedulers

## 2024-05-03 ENCOUNTER — Other Ambulatory Visit: Payer: Self-pay

## 2024-05-03 ENCOUNTER — Other Ambulatory Visit: Payer: Self-pay | Admitting: Internal Medicine

## 2024-05-03 ENCOUNTER — Other Ambulatory Visit (INDEPENDENT_AMBULATORY_CARE_PROVIDER_SITE_OTHER)

## 2024-05-03 DIAGNOSIS — K7460 Unspecified cirrhosis of liver: Secondary | ICD-10-CM

## 2024-05-03 DIAGNOSIS — M9904 Segmental and somatic dysfunction of sacral region: Secondary | ICD-10-CM | POA: Diagnosis not present

## 2024-05-03 DIAGNOSIS — M9903 Segmental and somatic dysfunction of lumbar region: Secondary | ICD-10-CM | POA: Diagnosis not present

## 2024-05-03 DIAGNOSIS — M9901 Segmental and somatic dysfunction of cervical region: Secondary | ICD-10-CM | POA: Diagnosis not present

## 2024-05-03 DIAGNOSIS — M9902 Segmental and somatic dysfunction of thoracic region: Secondary | ICD-10-CM | POA: Diagnosis not present

## 2024-05-03 LAB — CBC WITH DIFFERENTIAL/PLATELET
Basophils Absolute: 0 K/uL (ref 0.0–0.1)
Basophils Relative: 0.7 % (ref 0.0–3.0)
Eosinophils Absolute: 0.1 K/uL (ref 0.0–0.7)
Eosinophils Relative: 2.2 % (ref 0.0–5.0)
HCT: 30.5 % — ABNORMAL LOW (ref 36.0–46.0)
Hemoglobin: 9.5 g/dL — ABNORMAL LOW (ref 12.0–15.0)
Lymphocytes Relative: 18.1 % (ref 12.0–46.0)
Lymphs Abs: 0.9 K/uL (ref 0.7–4.0)
MCHC: 31.2 g/dL (ref 30.0–36.0)
MCV: 81.4 fl (ref 78.0–100.0)
Monocytes Absolute: 0.4 K/uL (ref 0.1–1.0)
Monocytes Relative: 7.7 % (ref 3.0–12.0)
Neutro Abs: 3.4 K/uL (ref 1.4–7.7)
Neutrophils Relative %: 71.3 % (ref 43.0–77.0)
Platelets: 156 K/uL (ref 150.0–400.0)
RBC: 3.74 Mil/uL — ABNORMAL LOW (ref 3.87–5.11)
RDW: 17.9 % — ABNORMAL HIGH (ref 11.5–15.5)
WBC: 4.8 K/uL (ref 4.0–10.5)

## 2024-05-03 LAB — COMPREHENSIVE METABOLIC PANEL WITH GFR
ALT: 14 U/L (ref 0–35)
AST: 20 U/L (ref 0–37)
Albumin: 4 g/dL (ref 3.5–5.2)
Alkaline Phosphatase: 58 U/L (ref 39–117)
BUN: 32 mg/dL — ABNORMAL HIGH (ref 6–23)
CO2: 28 meq/L (ref 19–32)
Calcium: 9.6 mg/dL (ref 8.4–10.5)
Chloride: 104 meq/L (ref 96–112)
Creatinine, Ser: 1.08 mg/dL (ref 0.40–1.20)
GFR: 48.17 mL/min — ABNORMAL LOW
Glucose, Bld: 157 mg/dL — ABNORMAL HIGH (ref 70–99)
Potassium: 4 meq/L (ref 3.5–5.1)
Sodium: 141 meq/L (ref 135–145)
Total Bilirubin: 0.9 mg/dL (ref 0.2–1.2)
Total Protein: 6.6 g/dL (ref 6.0–8.3)

## 2024-05-03 LAB — PROTIME-INR
INR: 1.6 ratio — ABNORMAL HIGH (ref 0.8–1.0)
Prothrombin Time: 16.5 s — ABNORMAL HIGH (ref 9.6–13.1)

## 2024-05-04 ENCOUNTER — Ambulatory Visit: Payer: Self-pay | Admitting: Gastroenterology

## 2024-05-05 ENCOUNTER — Other Ambulatory Visit: Payer: Self-pay

## 2024-05-05 DIAGNOSIS — K7460 Unspecified cirrhosis of liver: Secondary | ICD-10-CM

## 2024-05-05 DIAGNOSIS — K754 Autoimmune hepatitis: Secondary | ICD-10-CM

## 2024-05-07 LAB — AFP TUMOR MARKER: AFP-Tumor Marker: 1.3 ng/mL

## 2024-05-10 ENCOUNTER — Other Ambulatory Visit

## 2024-05-10 DIAGNOSIS — K7460 Unspecified cirrhosis of liver: Secondary | ICD-10-CM | POA: Diagnosis not present

## 2024-05-10 DIAGNOSIS — K754 Autoimmune hepatitis: Secondary | ICD-10-CM

## 2024-05-10 DIAGNOSIS — M9902 Segmental and somatic dysfunction of thoracic region: Secondary | ICD-10-CM | POA: Diagnosis not present

## 2024-05-10 DIAGNOSIS — M9903 Segmental and somatic dysfunction of lumbar region: Secondary | ICD-10-CM | POA: Diagnosis not present

## 2024-05-10 DIAGNOSIS — M9901 Segmental and somatic dysfunction of cervical region: Secondary | ICD-10-CM | POA: Diagnosis not present

## 2024-05-10 DIAGNOSIS — M9904 Segmental and somatic dysfunction of sacral region: Secondary | ICD-10-CM | POA: Diagnosis not present

## 2024-05-10 LAB — FOLATE: Folate: 17.8 ng/mL (ref >5.9)

## 2024-05-10 LAB — IBC + FERRITIN
Ferritin: 13.4 ng/mL (ref 10.0–291.0)
Iron: 30 ug/dL — ABNORMAL LOW (ref 42–145)
Saturation Ratios: 7.2 % — ABNORMAL LOW (ref 20.0–50.0)
TIBC: 414.4 ug/dL (ref 250.0–450.0)
Transferrin: 296 mg/dL (ref 212.0–360.0)

## 2024-05-11 ENCOUNTER — Ambulatory Visit (HOSPITAL_COMMUNITY)
Admission: RE | Admit: 2024-05-11 | Discharge: 2024-05-11 | Disposition: A | Discharge status: Home / Self Care | Source: Ambulatory Visit | Attending: Gastroenterology | Admitting: Gastroenterology

## 2024-05-11 ENCOUNTER — Ambulatory Visit: Payer: Self-pay | Admitting: Gastroenterology

## 2024-05-11 DIAGNOSIS — R161 Splenomegaly, not elsewhere classified: Secondary | ICD-10-CM | POA: Diagnosis not present

## 2024-05-11 DIAGNOSIS — K8020 Calculus of gallbladder without cholecystitis without obstruction: Secondary | ICD-10-CM | POA: Diagnosis not present

## 2024-05-11 DIAGNOSIS — K7460 Unspecified cirrhosis of liver: Secondary | ICD-10-CM | POA: Insufficient documentation

## 2024-05-11 DIAGNOSIS — K862 Cyst of pancreas: Secondary | ICD-10-CM | POA: Diagnosis not present

## 2024-05-11 LAB — IRON, TOTAL/TOTAL IRON BINDING CAP
%SAT: 9 % — ABNORMAL LOW (ref 16–45)
Iron: 36 ug/dL — ABNORMAL LOW (ref 45–160)
TIBC: 382 ug/dL (ref 250–450)

## 2024-05-14 ENCOUNTER — Other Ambulatory Visit: Payer: Self-pay

## 2024-05-14 DIAGNOSIS — D509 Iron deficiency anemia, unspecified: Secondary | ICD-10-CM

## 2024-05-14 MED ORDER — FERROUS GLUCONATE 324 (38 FE) MG PO TABS: 324.0000 mg | ORAL_TABLET | Freq: Every day | ORAL | 2 refills | Status: DC

## 2024-05-18 DIAGNOSIS — M51362 Other intervertebral disc degeneration, lumbar region with discogenic back pain and lower extremity pain: Secondary | ICD-10-CM | POA: Diagnosis not present

## 2024-05-18 DIAGNOSIS — M5134 Other intervertebral disc degeneration, thoracic region: Secondary | ICD-10-CM | POA: Diagnosis not present

## 2024-05-18 DIAGNOSIS — M9901 Segmental and somatic dysfunction of cervical region: Secondary | ICD-10-CM | POA: Diagnosis not present

## 2024-05-18 DIAGNOSIS — M5135 Other intervertebral disc degeneration, thoracolumbar region: Secondary | ICD-10-CM | POA: Diagnosis not present

## 2024-05-25 DIAGNOSIS — M9903 Segmental and somatic dysfunction of lumbar region: Secondary | ICD-10-CM | POA: Diagnosis not present

## 2024-05-25 DIAGNOSIS — M9904 Segmental and somatic dysfunction of sacral region: Secondary | ICD-10-CM | POA: Diagnosis not present

## 2024-05-25 DIAGNOSIS — M9901 Segmental and somatic dysfunction of cervical region: Secondary | ICD-10-CM | POA: Diagnosis not present

## 2024-05-25 DIAGNOSIS — M9902 Segmental and somatic dysfunction of thoracic region: Secondary | ICD-10-CM | POA: Diagnosis not present

## 2024-06-01 DIAGNOSIS — M9904 Segmental and somatic dysfunction of sacral region: Secondary | ICD-10-CM | POA: Diagnosis not present

## 2024-06-01 DIAGNOSIS — M9903 Segmental and somatic dysfunction of lumbar region: Secondary | ICD-10-CM | POA: Diagnosis not present

## 2024-06-01 DIAGNOSIS — M9901 Segmental and somatic dysfunction of cervical region: Secondary | ICD-10-CM | POA: Diagnosis not present

## 2024-06-01 DIAGNOSIS — M9902 Segmental and somatic dysfunction of thoracic region: Secondary | ICD-10-CM | POA: Diagnosis not present

## 2024-06-02 DIAGNOSIS — H401131 Primary open-angle glaucoma, bilateral, mild stage: Secondary | ICD-10-CM | POA: Diagnosis not present

## 2024-06-04 ENCOUNTER — Ambulatory Visit: Payer: Self-pay | Admitting: Gastroenterology

## 2024-06-04 ENCOUNTER — Encounter: Payer: Self-pay | Admitting: Gastroenterology

## 2024-06-04 VITALS — BP 128/60 | HR 46 | Ht 64.0 in | Wt 204.6 lb

## 2024-06-04 DIAGNOSIS — K7460 Unspecified cirrhosis of liver: Secondary | ICD-10-CM | POA: Diagnosis not present

## 2024-06-04 DIAGNOSIS — K862 Cyst of pancreas: Secondary | ICD-10-CM | POA: Diagnosis not present

## 2024-06-04 DIAGNOSIS — D509 Iron deficiency anemia, unspecified: Secondary | ICD-10-CM

## 2024-06-04 MED ORDER — NA SULFATE-K SULFATE-MG SULF 17.5-3.13-1.6 GM/177ML PO SOLN: 1.0000 | ORAL | 0 refills | Status: DC

## 2024-06-04 NOTE — Progress Notes (Unsigned)
 GASTROENTEROLOGY OUTPATIENT CLINIC VISIT   Primary Care Provider Norleen Lynwood ORN, MD [address] [place] [address] [phone]  Patient Profile: Kelsey Keller is a 83 y.o. female with a pmh significant for hypertension, hyperlipidemia, hypothyroidism, diabetes, cataracts, skin cancers BCC, osteoarthritis, osteoporosis, spinal stenosis, cirrhosis (felt secondary to burned-out AIH without overt portal hypertension changes), history of PUD (DUs).  The patient presents to the [facility] for an evaluation and management of problem(s) noted below:  Problem List No diagnosis found.   History of Present Illness Please see prior GI notes for full details of HPI.  Interval History The patient returns for follow-up.  She is a patient of Dr. Teressa who has been followed over the course of the last 6 years for abnormal LFTs that eventually led to diagnosis of cirrhosis based on biopsies and was felt to likely be secondary to burned-out AIH.  She has not been on prednisone  therapy.  Back in 2018 when she had her last endoscopic evaluation there was no evidence of portal hypertension.  She has never been vaccinated against hepatitis A or hepatitis B and is not sure if she wants to have that currently.  She has a lot on her plate as she has a surgery coming up next few weeks on her back.  The patient denies any issues with jaundice, scleral icterus, generalized pruritus, darkened/amber urine, clay-colored stools, LE edema, hematemesis, coffee-ground emesis, abdominal distention, confusion.  She is not sure if she wanted to want to undergo repeat endoscopic evaluation but is willing to consider in the future.  Agree that she underwent a abdominal ultrasound yesterday with the results still pending at the time of clinic visit.  GI Review of Systems Positive as above Negative for dysphagia, odynophagia, nausea, vomiting, pain, alteration of bowel habits, melena,  hematochezia  Review of Systems General: Denies fevers/chills/weight loss unintentionally Cardiovascular: Denies chest pain Pulmonary: Denies shortness of breath Gastroenterological: See HPI Genitourinary: Denies darkened urine  Hematological: Denies easy bruising/bleeding Dermatological: Denies jaundice Psychological: Mood is stable   Medications Current Outpatient Medications  Medication Sig Dispense Refill   atenolol  (TENORMIN ) 50 MG tablet Take 1 tablet (50 mg total) by mouth daily. 90 tablet 3   augmented betamethasone dipropionate (DIPROLENE-AF) 0.05 % cream Apply 1 Application topically 2 (two) times daily.     blood glucose meter kit and supplies KIT Dispense with patient  Insurance; Use up to four times daily as directed. E11.9 1 each 0   Blood Glucose Monitoring Suppl (ONE TOUCH ULTRA 2) w/Device KIT Use as directed up to 4 times per day E11.9 1 each 0   brimonidine (ALPHAGAN) 0.2 % ophthalmic solution 1 drop 2 (two) times daily.     Cholecalciferol (VITAMIN D3) 50 MCG (2000 UT) TABS Take 2,000 Units by mouth daily.     Co-Enzyme Q-10 100 MG CAPS Take 100 mg by mouth daily.     cyanocobalamin  1000 MCG tablet Take 1,000 mcg by mouth daily.     dapagliflozin  propanediol (FARXIGA ) 10 MG TABS tablet Take 1 tablet (10 mg total) by mouth daily before breakfast. 90 tablet 3   ferrous gluconate  (FERGON) 324 MG tablet Take 1 tablet (324 mg total) by mouth daily with breakfast. 30 tablet 2   fluticasone  (FLONASE ) 50 MCG/ACT nasal spray SPRAY 2 SPRAYS INTO EACH NOSTRIL EVERY DAY 48 mL 3   glucose blood (ONETOUCH VERIO) test strip USE TO CHECK BLOOD SUGARS TWICE A DAY 200 strip 0  hydrochlorothiazide  (HYDRODIURIL ) 25 MG tablet Take 1 tablet (25 mg total) by mouth daily. 90 tablet 3   Lancets MISC Use to check blood sugars twice a day 200 each 0   latanoprost (XALATAN) 0.005 % ophthalmic solution SMARTSIG:1 Drop(s) In Eye(s) Every Evening     levothyroxine  (SYNTHROID ) 125 MCG tablet TAKE  1 TABLET BY MOUTH EVERY DAY BEFORE BREAKFAST 90 tablet 3   Magnesium  250 MG TABS Take 250 mg by mouth daily.     meloxicam  (MOBIC ) 7.5 MG tablet TAKE 1 TABLET BY MOUTH EVERY DAY AS NEEDED FOR PAIN 90 tablet 1   Menthol , Topical Analgesic, (BENGAY EX) Apply 1 Application topically daily as needed (arthritis pain).     metFORMIN  (GLUCOPHAGE ) 1000 MG tablet Take 1 tablet (1,000 mg total) by mouth 2 (two) times daily with a meal. 180 tablet 3   omeprazole  (PRILOSEC) 40 MG capsule Take 1 capsule (40 mg total) by mouth in the morning and at bedtime. Twice a day for 2 months, then decrease to once daily. If started on blood thinners continue twice daily. 120 capsule 5   pioglitazone  (ACTOS ) 30 MG tablet Take 1 tablet (30 mg total) by mouth daily. 90 tablet 3   Red Yeast Rice Extract 600 MG CAPS Take 600 mg by mouth 2 (two) times daily.     rivaroxaban  (XARELTO ) 20 MG TABS tablet Take 1 tablet (20 mg total) by mouth daily with supper. 90 tablet 3   traMADol  (ULTRAM ) 50 MG tablet Take 1 tablet (50 mg total) by mouth every 8 (eight) hours as needed. 90 tablet 5   No current facility-administered medications for this visit.    Allergies No Known Allergies  Histories Past Medical History:  Diagnosis Date   A-fib (HCC)    ALLERGIC RHINITIS 04/29/2007   Allergy    Basal cell carcinoma 03/24/2018   sclerosis- upper bridge of nose (MOHS)   BCC (basal cell carcinoma) 10/18/2019   right supra orbital region- Cx3 71fu   Cataract    both eyes   DIABETES MELLITUS, TYPE II 06/23/2007   type 2   Elevated liver enzymes 03/2017   Elevated tumor markers    GERD (gastroesophageal reflux disease)    Heart murmur    per patient   HIATAL HERNIA, HX OF 04/29/2007   History of hiatal hernia    HYPERLIPIDEMIA 04/29/2007   diet controlled on CoQ10   HYPERTENSION 04/29/2007   HYPOKALEMIA 05/02/2009   HYPOTHYROIDISM 04/29/2007   LOW BACK PAIN 04/29/2007   Neuromuscular disorder (HCC)    diabetic neuropathy  in both legs   OSTEOARTHRITIS, KNEE, LEFT 06/23/2007   OSTEOPOROSIS NOS 05/12/2007   SPINAL STENOSIS, LUMBAR 04/29/2007   Past Surgical History:  Procedure Laterality Date   BACK SURGERY N/A 2014 x 2   T1 TO S 1    BACK SURGERY     CATARACT EXTRACTION W/ INTRAOCULAR LENS  IMPLANT, BILATERAL Bilateral 05/2011   doing well   COLONOSCOPY WITH PROPOFOL  N/A 04/10/2017   Procedure: COLONOSCOPY WITH PROPOFOL ;  Surgeon: Teressa Toribio SQUIBB, MD;  Location: [facility];  Service: Endoscopy;  Laterality: N/A;   COLONSCOPY  2011   JOINT REPLACEMENT     TOTAL KNEE ARTHROPLASTY  2006   right   TOTAL KNEE ARTHROPLASTY Left 2012   Social History   Socioeconomic History   Marital status: Divorced    Spouse name: Not on file   Number of children: 1   Years of education: Not on file  Highest education level: GED or equivalent  Occupational History   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  Tobacco Use   Smoking status: Never   Smokeless tobacco: Never  Vaping Use   Vaping status: Never Used  Substance and Sexual Activity   Alcohol  use: Yes    Alcohol /week: 0.0 standard drinks of alcohol     Comment: One or two drinks in month   Drug use: No   Sexual activity: Not Currently  Other Topics Concern   Not on file  Social History Narrative   Not on file   Social Drivers of Health   Financial Resource Strain: Low Risk  (12/29/2023)   Overall Financial Resource Strain (CARDIA)    Difficulty of Paying Living Expenses: Not hard at all  Food Insecurity: No Food Insecurity (06/04/2024)   Hunger Vital Sign    Worried About Running Out of Food in the Last Year: Never true    Ran Out of Food in the Last Year: Never true  Transportation Needs: No Transportation Needs (06/04/2024)   PRAPARE - Administrator, Civil Service (Medical): No    Lack of Transportation (Non-Medical): No  Physical Activity: Inactive (12/29/2023)   Exercise Vital Sign    Days of Exercise per Week: 0  days    Minutes of Exercise per Session: Not on file  Stress: No Stress Concern Present (12/29/2023)   [employer] of Occupational Health - Occupational Stress Questionnaire    Feeling of Stress: Not at all  Social Connections: Moderately Integrated (12/29/2023)   Social Connection and Isolation Panel    Frequency of Communication with Friends and Family: More than three times a week    Frequency of Social Gatherings with Friends and Family: Three times a week    Attends Religious Services: More than 4 times per year    Active Member of Clubs or Organizations: Yes    Attends Banker Meetings: 1 to 4 times per year    Marital Status: Divorced  Intimate Partner Violence: Not At Risk (12/09/2023)   Humiliation, Afraid, Rape, and Kick questionnaire    Fear of Current or Ex-Partner: No    Emotionally Abused: No    Physically Abused: No    Sexually Abused: No   Family History  Problem Relation Age of Onset   Arthritis Mother    Hypertension Mother    Coronary artery disease Father    Arthritis Brother    Stroke Brother    Hyperlipidemia Brother    COPD Brother    Coronary artery disease Other        Female 1st degree relative   Diabetes Other        1st degree relative   Colon polyps Other    Colon cancer Neg Hx    Esophageal cancer Neg Hx    Rectal cancer Neg Hx    Stomach cancer Neg Hx    Inflammatory bowel disease Neg Hx    Liver disease Neg Hx    Pancreatic cancer Neg Hx    BRCA 1/2 Neg Hx    Breast cancer Neg Hx    I have reviewed her medical, social, and family history in detail and updated the electronic medical record as necessary.    PHYSICAL EXAMINATION  BP 128/60   Pulse (!) 46   Ht 5' 4 (1.626 m)   Wt 204 lb 9 oz (92.8 kg)   BMI 35.11 kg/m  Wt Readings from Last 3 Encounters:  06/04/24 204 lb 9 oz (92.8 kg)  12/30/23 202 lb (91.6 kg)  12/18/23 208 lb (94.3 kg)  GEN: NAD, appears stated age, doesn't appear chronically ill PSYCH:  Cooperative, without pressured speech EYE: Conjunctivae pink, sclerae anicteric ENT: MMM CV: Nontachycardic RESP: No audible wheezing GI: NABS, soft, NT/ND, without rebound MSK/EXT: Trace bilateral pedal edema SKIN: No jaundice NEURO:  Alert & Oriented x 3, no focal deficits   REVIEW OF DATA  I reviewed the following data at the time of this encounter:  GI Procedures and Studies  Reviewed 2018 EGD and colonoscopy reports  Laboratory Studies  Reviewed those in epic  MELD 3.0: 12 at 05/03/2024 10:54 AM MELD-Na: 13 at 05/03/2024 10:54 AM Calculated from: Serum Creatinine: 1.08 mg/dL at 89/79/7974 89:45 AM Serum Sodium: 141 mEq/L (Using max of 137 mEq/L) at 05/03/2024 10:54 AM Total Bilirubin: 0.9 mg/dL (Using min of 1 mg/dL) at 89/79/7974 89:45 AM Serum Albumin : 4 g/dL (Using max of 3.5 g/dL) at 89/79/7974 89:45 AM INR(ratio): 1.6 ratio at 05/03/2024 10:54 AM Age at listing (hypothetical): 83 years Sex: Female at 05/03/2024 10:54 AM  Imaging Studies  September 17, 2022 abdominal ultrasound Still pending read  September 2023 right upper quadrant ultrasound IMPRESSION: Morphologic changes compatible with cirrhosis.  No focal lesion   ASSESSMENT  Ms. Orne is a 83 y.o. female with a pmh significant for hypertension, hyperlipidemia, hypothyroidism, diabetes, cataracts, skin cancers BCC, osteoarthritis, osteoporosis, spinal stenosis, cirrhosis (felt secondary to burned-out AIH without overt portal hypertension changes), history of PUD (DUs).  The patient is seen today for evaluation and management of:  No diagnosis found.  The patient is hemodynamically and clinically stable.  The patient's last set of liver biochemical tests done in February were normal.  Unfortunately there was no INR performed and has not had 1 in the last few months.  I cannot calculate her MELD score.  She is not currently interested in any additional laboratories at this time other than what she will need for  her upcoming back surgery.  I would say that overall however she looks to be a Karolynn Morones A cirrhotic as long as her INR is not greater than 2.3, which I would not expect based on her current clinical status.  I did recommend hepatitis A and hepatitis B vaccines but she is not interested at currently but may want to consider that in the future at follow-up.  She is also overdue for portal hypertension evaluation with endoscopy, again she does not want to move forward with this currently but may consider this in follow-up.  I told her we would update her once the results of her ultrasound returned but hopefully did not show any evidence of HCC.  I offered her to do an AFP as well but she did not want to have that laboratory performed currently.  I would recommend that we redo that at her follow-up HCC screening, pending her current ultrasound looks okay.  Will make no other medication adjustments or changes at this time.  Will be available for her as needed.  All patient questions were answered to the best of my ability, and the patient agrees to the aforementioned plan of action with follow-up as indicated.   PLAN  #ESLD Management Volume -None -Obtain standing weight at least 3 times weekly -1500-2000 mg Na diet Infection -No evidence of SBP on last imaging we will wait to see this current imaging but likely will not need prophylaxis Bleeding -Last EGD 2018 without varices  so is overdue but patient does not want to pursue this currently we will continue to discuss in future Encephalopathy -None Screening -Follow-up yesterday's abdominal ultrasound Transplant -Based on her multiple medical admitted for disease and age, would not recommend transplant evaluation Vaccination -HAV Immunity, HBV Immunity is recommended and she will consider this at follow-up -Recommend yearly influenza -Recommend Pneumococcal vaccines Other -Recommended additional laboratories to complete MELD score evaluation as  well as AFP but patient deferred -Promote intake of 1.5 g/kg/day of Protein (Ensures/Boosts at each meal) -Continue PPI with history of DU and with patient still on NSAIDs   No orders of the defined types were placed in this encounter.   New Prescriptions   No medications on file   Modified Medications   No medications on file    Planned Follow Up No follow-ups on file.   Total Time in Face-to-Face and in Coordination of Care for patient including independent/personal interpretation/review of prior testing, medical history, examination, medication adjustment, communicating results with the patient directly, and documentation within the EHR is 25 minutes.   Aloha Finner, MD  Gastroenterology [facility] Endoscopy Office # [phone]

## 2024-06-04 NOTE — Patient Instructions (Signed)
 We have sent the following medications to your pharmacy for you to pick up at your convenience: Suprep   You have been scheduled for a colonoscopy. Please follow written instructions given to you at your visit today.   If you use inhalers (even only as needed), please bring them with you on the day of your procedure.  DO NOT TAKE 7 DAYS PRIOR TO TEST- Trulicity (dulaglutide) Ozempic, Wegovy (semaglutide) Mounjaro, Zepbound (tirzepatide) Bydureon Bcise (exanatide extended release)  DO NOT TAKE 1 DAY PRIOR TO YOUR TEST Rybelsus (semaglutide) Adlyxin (lixisenatide) Victoza (liraglutide) Byetta (exanatide) ___________________________________________________________________________   Thank you for choosing me and [facility].  Dr. Wilhelmenia

## 2024-06-06 ENCOUNTER — Encounter: Payer: Self-pay | Admitting: Gastroenterology

## 2024-06-06 DIAGNOSIS — K862 Cyst of pancreas: Secondary | ICD-10-CM | POA: Insufficient documentation

## 2024-06-07 NOTE — Progress Notes (Unsigned)
 [facility]   [phone] [phone]   Clinic New consult Note   Patient Care Team: Norleen Lynwood ORN, MD as PCP - General Dann, Candyce RAMAN, MD as PCP - Cardiology (Cardiology) Oman, Heather, [place] (Optometry) Mansouraty, Aloha Raddle., MD as Consulting Physician (Gastroenterology) Jordan, Peter M, MD as Consulting Physician (Cardiology) Rosella Lenis, MD as Referring Physician (Pain Medicine) Colon Shove, MD as Consulting Physician (Neurosurgery) Zan Factor, DPM as Consulting Physician (Podiatry) 06/08/2024  CHIEF COMPLAINTS/PURPOSE OF CONSULTATION:  Iron deficiency anemia, referred by GI Dr. Wilhelmenia   HISTORY OF PRESENTING ILLNESS:  Kelsey Keller 83 y.o. female with PMH including atherosclerosis, aortic stenosis, Afib, HTN, asthma, autoimmune hepatitis, gastroenteritis, cirrhosis, IBS, DM, hypothyroidism, neuropathy, B12 deficiency, CKD, and arthritis is here because of anemia. She was first found to have anemia Hgb 11.5 on 09/15/20 which has progressed over time (09/18/21 10.8, 08/14/22 10.1).  She donated blood until 2018 with cirrhosis diagnosis.  She had acute on chronic anemia Hgb 7.2 on 09/30/22 during hospitalization for multilevel spinal decompression. She recovered to 9.6 on 10/08/22 with iron panel showing ferritin 65, serum iron 46, 13% saturation, and TIBC 333. Hgb remained in the 9 range for the duration of 2024 up to present.  She underwent EGD 11/04/2023 to rule out esophageal varices, results showed a 2 cm hiatal hernia and antral gastritis. Most recent labs 05/04/23 Hgb 9.5, normal MCV, normal WBC/plt. Further work up shows serum iron 30, 7% saturation, ferritin 13, and TIBC 414; normal folate, B12 level 12/17/23 normal 473.  She was referred by GI due to persistent iron deficiency anemia.  Socially she is divorced, 2 children, independent with ADLs and lives alone.  She is up-to-date on age-appropriate cancer screenings.  Rarely drinks  alcohol , denies tobacco or other drug use.  Personal history of skin cancer, her brother had prostate cancer and what sounds like a chronic blood cancer possibly CLL or multiple myeloma.   Kelsey Keller presents by herself, feeling tired in general since diagnosed with A-fib.  She is sedentary.  Denies obvious bleeding.  Denies recent infection.  She has a chronic cough, no chest pain or dyspnea.  Denies fever, night sweats, or unintentional weight loss.  She has lost 50 pounds over 1-1/2 years with weight watchers.  Denies pica.  She has urinary incontinence, bowel movements are urgent with mucus.  Has been on oral iron for couple weeks, tolerating well without GI side effects.    MEDICAL HISTORY:  Past Medical History:  Diagnosis Date   A-fib [facility])    ALLERGIC RHINITIS 04/29/2007   Allergy    Basal cell carcinoma 03/24/2018   sclerosis- upper bridge of nose (MOHS)   BCC (basal cell carcinoma) 10/18/2019   right supra orbital region- Cx3 21fu   Cataract    both eyes   DIABETES MELLITUS, TYPE II 06/23/2007   type 2   Elevated liver enzymes 03/2017   Elevated tumor markers    GERD (gastroesophageal reflux disease)    Heart murmur    per patient   HIATAL HERNIA, HX OF 04/29/2007   History of hiatal hernia    HYPERLIPIDEMIA 04/29/2007   diet controlled on CoQ10   HYPERTENSION 04/29/2007   HYPOKALEMIA 05/02/2009   HYPOTHYROIDISM 04/29/2007   LOW BACK PAIN 04/29/2007   Neuromuscular disorder (HCC)    diabetic neuropathy in both legs   OSTEOARTHRITIS, KNEE, LEFT 06/23/2007   OSTEOPOROSIS NOS 05/12/2007   SPINAL STENOSIS, LUMBAR 04/29/2007    SURGICAL  HISTORY: Past Surgical History:  Procedure Laterality Date   BACK SURGERY N/A 2014 x 2   T1 TO S 1    BACK SURGERY     CATARACT EXTRACTION W/ INTRAOCULAR LENS  IMPLANT, BILATERAL Bilateral 05/2011   doing well   COLONOSCOPY WITH PROPOFOL  N/A 04/10/2017   Procedure: COLONOSCOPY WITH PROPOFOL ;  Surgeon: Teressa Toribio SQUIBB, MD;   Location: [facility];  Service: Endoscopy;  Laterality: N/A;   COLONSCOPY  2011   JOINT REPLACEMENT     TOTAL KNEE ARTHROPLASTY  2006   right   TOTAL KNEE ARTHROPLASTY Left 2012    SOCIAL HISTORY: 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  Tobacco Use   Smoking status: Never   Smokeless tobacco: Never  Vaping Use   Vaping status: Never Used  Substance and Sexual Activity   Alcohol  use: Yes    Alcohol /week: 0.0 standard drinks of alcohol     Comment: One or two drinks in month   Drug use: No   Sexual activity: Not Currently  Other Topics Concern   Not on file  Social History Narrative   Not on file   Social Drivers of Health   Financial Resource Strain: Low Risk  (12/29/2023)   Overall Financial Resource Strain (CARDIA)    Difficulty of Paying Living Expenses: Not hard at all  Food Insecurity: No Food Insecurity (06/08/2024)   Hunger Vital Sign    Worried About Running Out of Food in the Last Year: Never true    Ran Out of Food in the Last Year: Never true  Transportation Needs: No Transportation Needs (06/08/2024)   PRAPARE - Administrator, Civil Service (Medical): No    Lack of Transportation (Non-Medical): No  Physical Activity: Inactive (12/29/2023)   Exercise Vital Sign    Days of Exercise per Week: 0 days    Minutes of Exercise per Session: Not on file  Stress: No Stress Concern Present (12/29/2023)   [employer] of Occupational Health - Occupational Stress Questionnaire    Feeling of Stress: Not at all  Social Connections: Moderately Integrated (12/29/2023)   Social Connection and Isolation Panel    Frequency of Communication with Friends and Family: More than three times a week    Frequency of Social Gatherings  with Friends and Family: Three times a week    Attends Religious Services: More than 4 times per year    Active Member of Clubs or Organizations: Yes    Attends Banker Meetings: 1 to 4 times per year    Marital Status: Divorced  Intimate Partner Violence: Unknown (06/08/2024)   Humiliation, Afraid, Rape, and Kick questionnaire    Fear of Current or Ex-Partner: Not on file    Emotionally Abused: No    Physically Abused: No    Sexually Abused: No    FAMILY HISTORY: Family History  Problem Relation Age of Onset   Arthritis Mother    Hypertension Mother    Coronary artery disease Father    Arthritis Brother    Stroke Brother    Hyperlipidemia Brother    COPD Brother    Coronary artery disease Other        Female 1st degree relative  Diabetes Other        1st degree relative   Colon polyps Other    Colon cancer Neg Hx    Esophageal cancer Neg Hx    Rectal cancer Neg Hx    Stomach cancer Neg Hx    Inflammatory bowel disease Neg Hx    Liver disease Neg Hx    Pancreatic cancer Neg Hx    BRCA 1/2 Neg Hx    Breast cancer Neg Hx     ALLERGIES:  has no known allergies.  MEDICATIONS:  Current Outpatient Medications  Medication Sig Dispense Refill   atenolol  (TENORMIN ) 50 MG tablet Take 1 tablet (50 mg total) by mouth daily. 90 tablet 3   augmented betamethasone dipropionate (DIPROLENE-AF) 0.05 % cream Apply 1 Application topically 2 (two) times daily.     blood glucose meter kit and supplies KIT Dispense with patient  Insurance; Use up to four times daily as directed. E11.9 1 each 0   Blood Glucose Monitoring Suppl (ONE TOUCH ULTRA 2) w/Device KIT Use as directed up to 4 times per day E11.9 1 each 0   brimonidine (ALPHAGAN) 0.2 % ophthalmic solution 1 drop 2 (two) times daily.     Cholecalciferol (VITAMIN D3) 50 MCG (2000 UT) TABS Take 2,000 Units by mouth daily.     Co-Enzyme Q-10 100 MG CAPS Take 100 mg by mouth daily.     cyanocobalamin  1000 MCG tablet Take 1,000  mcg by mouth daily.     dapagliflozin  propanediol (FARXIGA ) 10 MG TABS tablet Take 1 tablet (10 mg total) by mouth daily before breakfast. 90 tablet 3   ferrous gluconate  (FERGON) 324 MG tablet Take 1 tablet (324 mg total) by mouth daily with breakfast. 30 tablet 2   fluticasone  (FLONASE ) 50 MCG/ACT nasal spray SPRAY 2 SPRAYS INTO EACH NOSTRIL EVERY DAY 48 mL 3   glucose blood (ONETOUCH VERIO) test strip USE TO CHECK BLOOD SUGARS TWICE A DAY 200 strip 0   hydrochlorothiazide  (HYDRODIURIL ) 25 MG tablet Take 1 tablet (25 mg total) by mouth daily. 90 tablet 3   Lancets MISC Use to check blood sugars twice a day 200 each 0   latanoprost (XALATAN) 0.005 % ophthalmic solution SMARTSIG:1 Drop(s) In Eye(s) Every Evening     levothyroxine  (SYNTHROID ) 125 MCG tablet TAKE 1 TABLET BY MOUTH EVERY DAY BEFORE BREAKFAST 90 tablet 3   Magnesium  250 MG TABS Take 250 mg by mouth daily.     meloxicam  (MOBIC ) 7.5 MG tablet TAKE 1 TABLET BY MOUTH EVERY DAY AS NEEDED FOR PAIN 90 tablet 1   Menthol , Topical Analgesic, (BENGAY EX) Apply 1 Application topically daily as needed (arthritis pain).     metFORMIN  (GLUCOPHAGE ) 1000 MG tablet Take 1 tablet (1,000 mg total) by mouth 2 (two) times daily with a meal. 180 tablet 3   Na Sulfate-K Sulfate-Mg Sulfate concentrate (SUPREP BOWEL PREP KIT) 17.5-3.13-1.6 GM/177ML SOLN Take 1 kit (354 mLs total) by mouth as directed. For colonoscopy prep 354 mL 0   omeprazole  (PRILOSEC) 40 MG capsule Take 1 capsule (40 mg total) by mouth in the morning and at bedtime. Twice a day for 2 months, then decrease to once daily. If started on blood thinners continue twice daily. 120 capsule 5   pioglitazone  (ACTOS ) 30 MG tablet Take 1 tablet (30 mg total) by mouth daily. 90 tablet 3   Red Yeast Rice Extract 600 MG CAPS Take 600 mg by mouth 2 (two) times daily.     rivaroxaban  (  XARELTO ) 20 MG TABS tablet Take 1 tablet (20 mg total) by mouth daily with supper. 90 tablet 3   traMADol  (ULTRAM ) 50 MG  tablet Take 1 tablet (50 mg total) by mouth every 8 (eight) hours as needed. 90 tablet 5   No current facility-administered medications for this visit.    REVIEW OF SYSTEMS:   Constitutional: Denies fevers, chills or abnormal night sweats (+) intentional weight loss Eyes: Denies blurriness of vision, double vision or watery eyes Ears, nose, mouth, throat, and face: Denies mucositis or sore throat Respiratory: Denies cough, dyspnea or wheezes Cardiovascular: Denies palpitation, chest discomfort or lower extremity swelling (+) A-fib on anticoagulation Gastrointestinal:  Denies nausea, heartburn, hematochezia, or change in bowel habits Skin: Denies abnormal skin rashes (+) bruising MSK: (+) Arthritis with multiple back surgeries Lymphatics: Denies new lymphadenopathy or easy bruising Neurological:Denies numbness, tingling or new weaknesses (+) neuropathy Behavioral/Psych: Mood is stable, no new changes  All other systems were reviewed with the patient and are negative.  PHYSICAL EXAMINATION:  Vitals:   06/08/24 1226  BP: 138/78  Pulse: 86  Resp: 17  Temp: 97.6 F (36.4 C)  SpO2: 96%   Filed Weights   06/08/24 1226  Weight: 205 lb 11.2 oz (93.3 kg)    GENERAL:alert, no distress and comfortable SKIN: skin color, texture, turgor are normal, no rashes or significant lesions.  Gothard ecchymoses over the forearms and mid chest EYES: sclera clear NECK:  without nodularity LYMPH:  no palpable lymphadenopathy in the cervical, axillary or inguinal LUNGS: clear with normal breathing effort HEART: A-fib, regular rate, murmur noted, no lower extremity edema ABDOMEN:abdomen soft, non-tender and normal bowel sounds Musculoskeletal:no cyanosis of digits and no clubbing  NEURO alert & oriented x 3 with fluent speech   LABORATORY DATA:  I have reviewed the data as listed    Latest Ref Rng & Units 05/03/2024   10:54 AM 12/17/2023    9:54 AM 07/01/2023   10:43 AM  CBC  WBC 4.0 - 10.5  K/uL 4.8  4.3  4.2   Hemoglobin 12.0 - 15.0 g/dL 9.5  9.9  9.6   Hematocrit 36.0 - 46.0 % 30.5  31.0  30.5   Platelets 150.0 - 400.0 K/uL 156.0  155.0  166.0        Latest Ref Rng & Units 05/03/2024   10:54 AM 12/17/2023    9:54 AM 07/01/2023   10:43 AM  CMP  Glucose 70 - 99 mg/dL 842  856  841   BUN 6 - 23 mg/dL 32  33  34   Creatinine 0.40 - 1.20 mg/dL 8.91  8.87  8.91   Sodium 135 - 145 mEq/L 141  137  140   Potassium 3.5 - 5.1 mEq/L 4.0  3.9  4.5   Chloride 96 - 112 mEq/L 104  99  104   CO2 19 - 32 mEq/L 28  31  28    Calcium 8.4 - 10.5 mg/dL 9.6  9.6  9.8   Total Protein 6.0 - 8.3 g/dL 6.6  6.5  6.4   Total Bilirubin 0.2 - 1.2 mg/dL 0.9  0.9  1.0   Alkaline Phos 39 - 117 U/L 58  61  73   AST 0 - 37 U/L 20  22  29    ALT 0 - 35 U/L 14  14  17       RADIOGRAPHIC STUDIES: I have personally reviewed the radiological images as listed and agreed with the findings in the report.  US  Abdomen Complete Result Date: 05/12/2024 CLINICAL DATA:  Cirrhosis EXAM: ABDOMEN ULTRASOUND COMPLETE COMPARISON:  05/12/2023, 09/17/2022, 07/27/2019 FINDINGS: Gallbladder: Gallstones. Normal wall thickness. Negative sonographic Murphy Common bile duct: Diameter: 4.5 mm Liver: Coarse nodular hepatic echotexture. No focal hepatic mass. Portal vein is patent on color Doppler imaging with normal direction of blood flow towards the liver. IVC: No abnormality visualized. Pancreas: Small cyst at the proximal pancreas measuring 9 x 7 x 7 mm, previously 9 mm. Spleen: Mild splenomegaly with volume of 410 mL Right Kidney: Length: 10 cm. Echogenicity within normal limits. No mass or hydronephrosis visualized. Left Kidney: Length: 9.5 cm. Echogenicity within normal limits. No mass or hydronephrosis visualized. Abdominal aorta: No aneurysm visualized. Other findings: None. IMPRESSION: 1. Cirrhotic liver without focal hepatic mass. 2. Mild splenomegaly. 3. Cholelithiasis. 4. Stable 9 mm cystic structure within the body of the  pancreas. Electronically Signed   By: Luke Bun M.D.   On: 05/12/2024 21:53    ASSESSMENT & PLAN: 83 year old female   Anemia -We reviewed her medical record in detail with the patient. She developed a mild progressive anemia since 2022, with acute on chronic episode during back surgery in 2024. Anemia recovered to 9 range postoperative which has been her baseline since then -She recently developed mild iron deficiency, ferritin 13, 9% saturation. EGD showed hiatal hernia and chronic gastritis. Her iron deficiency is likely secondary to microscopic bleeding from anticoagulation and poor absorption from hernia/gastritis  -She has been on oral iron with vitamin C for 2 weeks, tolerating well. She is fatigued but otherwise not especially symptomatic from anemia -We recommend to increase oral iron to BID for 2-3 months and proceed with colonoscopy per Dr. Wilhelmenia as scheduled in 07/2024.  -She likely also has a component of anemia of chronic disease due to her multiple other medical comorbidities.  -A primary bone marrow disorder such as MDS has not been ruled out, but we are not recommending bone marrow biopsy at this time. If Hgb drops <8 we will consider bone marrow biopsy and ESA -She will return for lab in 8-12 weeks, to see how she is responding to oral iron. If she is not tolerating or responding well, we will consider IV iron -F/up in 3 months  -Pt seen with Dr. Lanny   No problem-specific Assessment & Plan notes found for this encounter.    All questions were answered. The patient knows to call the clinic with any problems, questions or concerns. I spent {CHL ONC TIME VISIT - DTPQU:[id number]} counseling the patient face to face. The total time spent in the appointment was {CHL ONC TIME VISIT - DTPQU:[id number]} and more than 50% was on counseling.     Kelsey Keller K Kelsey Crihfield, NP 06/08/24 6:40 PM

## 2024-06-08 ENCOUNTER — Inpatient Hospital Stay: Attending: Nurse Practitioner | Admitting: Nurse Practitioner

## 2024-06-08 ENCOUNTER — Telehealth: Payer: Self-pay

## 2024-06-08 ENCOUNTER — Inpatient Hospital Stay

## 2024-06-08 VITALS — BP 138/78 | HR 86 | Temp 97.6°F | Resp 17 | Wt 205.7 lb

## 2024-06-08 DIAGNOSIS — R32 Unspecified urinary incontinence: Secondary | ICD-10-CM | POA: Insufficient documentation

## 2024-06-08 DIAGNOSIS — Z8042 Family history of malignant neoplasm of prostate: Secondary | ICD-10-CM | POA: Diagnosis not present

## 2024-06-08 DIAGNOSIS — D509 Iron deficiency anemia, unspecified: Secondary | ICD-10-CM | POA: Diagnosis not present

## 2024-06-08 DIAGNOSIS — Z85828 Personal history of other malignant neoplasm of skin: Secondary | ICD-10-CM | POA: Diagnosis not present

## 2024-06-08 DIAGNOSIS — D649 Anemia, unspecified: Secondary | ICD-10-CM

## 2024-06-08 DIAGNOSIS — K449 Diaphragmatic hernia without obstruction or gangrene: Secondary | ICD-10-CM | POA: Diagnosis not present

## 2024-06-08 DIAGNOSIS — K2951 Unspecified chronic gastritis with bleeding: Secondary | ICD-10-CM | POA: Insufficient documentation

## 2024-06-08 NOTE — Progress Notes (Signed)
 Cardiology Office Note:    Date:  06/14/2024   ID:  Kelsey Keller, DOB May 09, 1941, MRN [id number]  PCP:  Kelsey Lynwood ORN, MD   Kelsey Keller Cardiologist:  Kelsey Reek, MD     Referring MD: Kelsey Lynwood ORN, MD   Chief Complaint  Patient presents with   Atrial Fibrillation    History of Present Illness:    Kelsey Keller is a 83 y.o. female is seen for follow up Afib. She is sister to a long time patient of mine- Kelsey Keller. She has a history of DM, HTN, HLD. Was seen by Dr Keller during hospitalization for spine surgery in March 2024. Had paroxysmal AFib. Echo showed moderate LAE, normal LV function. Grade 2 diastolic dysfunction. Was managed with beta blocker and Eliquis . On follow up one month later was felt to be in NSR but no Ecg done.  Remote Myoview  in 2012 was normal.   She underwent upper EGD  in April 2025 and was noted to be in Afib with controlled rate. She denies any symptoms of palpitations, dizziness, SOB. She was placed on Eliquis  last year but quit taking it after a couple of months because it made her feel like a popsicle - always cold. No history of TIA/CVA or bleeding. Takes meloxicam  on a regular basis- can't really tolerate pain without it. Does have some evidence of cirrhosis but recent EGD showed no varices. Mild antral gastritis. On Protonix  now.   She is followed by hematology and GI for iron deficiency anemia. Planning on colonoscopy next month. Still unaware of Afib. No SOB. No obvious bleeding and tolerating Xarelto  well.   Past Medical History:  Diagnosis Date   A-fib Kelsey Keller)    ALLERGIC RHINITIS 04/29/2007   Allergy    Basal cell carcinoma 03/24/2018   sclerosis- upper bridge of nose (MO[facility])   BCC (basal cell carcinoma) 10/18/2019   right supra orbital region- Cx3 68fu   Cataract    both eyes   DIABETES MELLITUS, TYPE II 06/23/2007   type 2   Elevated liver enzymes 03/2017   Elevated tumor markers    GERD (gastroesophageal  reflux disease)    Heart murmur    per patient   HIATAL HERNIA, HX OF 04/29/2007   History of hiatal hernia    HYPERLIPIDEMIA 04/29/2007   diet controlled on CoQ10   HYPERTENSION 04/29/2007   HYPOKALEMIA 05/02/2009   HYPOTHYROIDISM 04/29/2007   LOW BACK PAIN 04/29/2007   Neuromuscular disorder (HCC)    diabetic neuropathy in both legs   OSTEOARTHRITIS, KNEE, LEFT 06/23/2007   OSTEOPOROSIS NOS 05/12/2007   SPINAL STENOSIS, LUMBAR 04/29/2007    Past Surgical History:  Procedure Laterality Date   BACK SURGERY N/A 2014 x 2   T1 TO S 1    BACK SURGERY     CATARACT EXTRACTION W/ INTRAOCULAR LENS  IMPLANT, BILATERAL Bilateral 05/2011   doing well   COLONOSCOPY WITH PROPOFOL  N/A 04/10/2017   Procedure: COLONOSCOPY WITH PROPOFOL ;  Surgeon: Kelsey Toribio SQUIBB, MD;  Location: [facility];  Service: Endoscopy;  Laterality: N/A;   COLONSCOPY  2011   JOINT REPLACEMENT     TOTAL KNEE ARTHROPLASTY  2006   right   TOTAL KNEE ARTHROPLASTY Left 2012    Current Medications: Current Meds  Medication Sig   atenolol  (TENORMIN ) 50 MG tablet Take 1 tablet (50 mg total) by mouth daily.   augmented betamethasone dipropionate (DIPROLENE-AF) 0.05 % cream Apply 1 Application topically 2 (two) times  daily.   blood glucose meter kit and supplies KIT Dispense with patient  Insurance; Use up to four times daily as directed. E11.9   Blood Glucose Monitoring Suppl (ONE TOUCH ULTRA 2) w/Device KIT Use as directed up to 4 times per day E11.9   brimonidine (ALPHAGAN) 0.2 % ophthalmic solution 1 drop 2 (two) times daily.   Cholecalciferol (VITAMIN D3) 50 MCG (2000 UT) TABS Take 2,000 Units by mouth daily.   cyanocobalamin  1000 MCG tablet Take 1,000 mcg by mouth daily.   dapagliflozin  propanediol (FARXIGA ) 10 MG TABS tablet Take 1 tablet (10 mg total) by mouth daily before breakfast.   ferrous gluconate  (FERGON) 324 MG tablet Take 1 tablet (324 mg total) by mouth daily with breakfast.   fluticasone  (FLONASE ) 50  MCG/ACT nasal spray SPRAY 2 SPRAYS INTO EACH NOSTRIL EVERY DAY   glucose blood (ONETOUCH VERIO) test strip USE TO CHECK BLOOD SUGARS TWICE A DAY   hydrochlorothiazide  (HYDRODIURIL ) 25 MG tablet Take 1 tablet (25 mg total) by mouth daily.   Lancets MISC Use to check blood sugars twice a day   latanoprost (XALATAN) 0.005 % ophthalmic solution SMARTSIG:1 Drop(s) In Eye(s) Every Evening   levothyroxine  (SYNTHROID ) 125 MCG tablet TAKE 1 TABLET BY MOUTH EVERY DAY BEFORE BREAKFAST   Magnesium  250 MG TABS Take 250 mg by mouth daily.   meloxicam  (MOBIC ) 7.5 MG tablet TAKE 1 TABLET BY MOUTH EVERY DAY AS NEEDED FOR PAIN   Menthol , Topical Analgesic, (BENGAY EX) Apply 1 Application topically daily as needed (arthritis pain).   metFORMIN  (GLUCOPHAGE ) 1000 MG tablet Take 1 tablet (1,000 mg total) by mouth 2 (two) times daily with a meal.   Na Sulfate-K Sulfate-Mg Sulfate concentrate (SUPREP BOWEL PREP KIT) 17.5-3.13-1.6 GM/177ML SOLN Take 1 kit (354 mLs total) by mouth as directed. For colonoscopy prep   omeprazole  (PRILOSEC) 40 MG capsule Take 1 capsule (40 mg total) by mouth in the morning and at bedtime. Twice a day for 2 months, then decrease to once daily. If started on blood thinners continue twice daily.   pioglitazone  (ACTOS ) 30 MG tablet Take 1 tablet (30 mg total) by mouth daily.   rivaroxaban  (XARELTO ) 20 MG TABS tablet Take 1 tablet (20 mg total) by mouth daily with supper.   traMADol  (ULTRAM ) 50 MG tablet Take 1 tablet (50 mg total) by mouth every 8 (eight) hours as needed.     Allergies:   Patient has no known allergies.   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  Tobacco Use   Smoking status: Never   Smokeless tobacco: Never  Vaping Use   Vaping status:  Never Used  Substance and Sexual Activity   Alcohol  use: Yes    Alcohol /week: 0.0 standard drinks of alcohol     Comment: One or two drinks in month   Drug use: No   Sexual activity: Not Currently  Other Topics Concern   Not on file  Social History Narrative   Not on file   Social Drivers of Health   Financial Resource Strain: Low Risk  (12/29/2023)   Overall Financial Resource Strain (CARDIA)    Difficulty of Paying Living Expenses: Not hard at all  Food Insecurity: No Food Insecurity (06/08/2024)  Hunger Vital Sign    Worried About Running Out of Food in the Last Year: Never true    Ran Out of Food in the Last Year: Never true  Transportation Needs: No Transportation Needs (06/08/2024)   PRAPARE - Administrator, Civil Service (Medical): No    Lack of Transportation (Non-Medical): No  Physical Activity: Inactive (12/29/2023)   Exercise Vital Sign    Days of Exercise per Week: 0 days    Minutes of Exercise per Session: Not on file  Stress: No Stress Concern Present (12/29/2023)   [employer] of Occupational Health - Occupational Stress Questionnaire    Feeling of Stress: Not at all  Social Connections: Moderately Integrated (12/29/2023)   Social Connection and Isolation Panel    Frequency of Communication with Friends and Family: More than three times a week    Frequency of Social Gatherings with Friends and Family: Three times a week    Attends Religious Services: More than 4 times per year    Active Member of Clubs or Organizations: Yes    Attends Banker Meetings: 1 to 4 times per year    Marital Status: Divorced     Family History: The patient's family history includes Arthritis in her brother and mother; COPD in her brother; Colon polyps in an other family member; Coronary artery disease in her father and another family member; Diabetes in an other family member; Hyperlipidemia in her brother; Hypertension in her mother; Stroke in her  brother. There is no history of Colon cancer, Esophageal cancer, Rectal cancer, Stomach cancer, Inflammatory bowel disease, Liver disease, Pancreatic cancer, BRCA 1/2, or Breast cancer.  ROS:   Please see the history of present illness.     All other systems reviewed and are negative.  EKGs/Labs/Other Studies Reviewed:    The following studies were reviewed today:  EKG Interpretation Date/Time:  Monday June 14 2024 10:13:00 EST Ventricular Rate:  80 PR Interval:    QRS Duration:  80 QT Interval:  366 QTC Calculation: 422 R Axis:   -49  Text Interpretation: Atrial fibrillation Low voltage QRS Left anterior fascicular block Cannot rule out Anteroseptal infarct (cited on or before 03-May-2004) When compared with ECG of 12-Dec-2023 11:47, No significant change was found Confirmed by Matix Henshaw [phone]) on 06/14/2024 10:14:01 AM   EKG Interpretation Date/Time:  Monday June 14 2024 10:13:00 EST Ventricular Rate:  80 PR Interval:    QRS Duration:  80 QT Interval:  366 QTC Calculation: 422 R Axis:   -49  Text Interpretation: Atrial fibrillation Low voltage QRS Left anterior fascicular block Cannot rule out Anteroseptal infarct (cited on or before 03-May-2004) When compared with ECG of 12-Dec-2023 11:47, No significant change was found Confirmed by Leith Szafranski [phone]) on 06/14/2024 10:14:01 AM   Echo 08/14/22: IMPRESSIONS     1. Left ventricular ejection fraction, by estimation, is 60 to 65%. The  left ventricle has normal function. The left ventricle has no regional  wall motion abnormalities. Left ventricular diastolic parameters are  consistent with Grade II diastolic  dysfunction (pseudonormalization).   2. Right ventricular systolic function is normal. The right ventricular  size is normal. There is normal pulmonary artery systolic pressure.   3. Left atrial size was moderately dilated.   4. Right atrial size was mildly dilated.   5. The mitral valve is normal in  structure. Trivial mitral valve  regurgitation. No evidence of mitral stenosis.   6. The aortic valve is calcified.  There is moderate calcification of the  aortic valve. There is moderate thickening of the aortic valve. Aortic  valve regurgitation is trivial. Mild aortic valve stenosis. Aortic valve  mean gradient measures 16.3 mmHg.   7. The inferior vena cava is normal in size with greater than 50%  respiratory variability, suggesting right atrial pressure of 3 mmHg.   Comparison(s): No prior Echocardiogram.  EKG Interpretation Date/Time:  Monday June 14 2024 10:13:00 EST Ventricular Rate:  80 PR Interval:    QRS Duration:  80 QT Interval:  366 QTC Calculation: 422 R Axis:   -49  Text Interpretation: Atrial fibrillation Low voltage QRS Left anterior fascicular block Cannot rule out Anteroseptal infarct (cited on or before 03-May-2004) When compared with ECG of 12-Dec-2023 11:47, No significant change was found Confirmed by Lauri Purdum [phone]) on 06/14/2024 10:14:01 AM    Recent Labs: 12/17/2023: TSH 1.42 05/03/2024: ALT 14; BUN 32; Creatinine, Ser 1.08; Hemoglobin 9.5; Platelets 156.0; Potassium 4.0; Sodium 141  Recent Lipid Panel    Component Value Date/Time   CHOL 129 12/17/2023 0954   TRIG 78.0 12/17/2023 0954   HDL 59.20 12/17/2023 0954   CHOLHDL 2 12/17/2023 0954   VLDL 15.6 12/17/2023 0954   LDLCALC 54 12/17/2023 0954   LDLCALC 75 01/26/2020 1141   LDLDIRECT 107.5 07/21/2013 1226     Risk Assessment/Calculations:    CHA2DS2-VASc Score = 6   This indicates a 9.7% annual risk of stroke. The patient's score is based upon: CHF History: 0 HTN History: 1 Diabetes History: 1 Stroke History: 0 Vascular Disease History: 1 Age Score: 2 Gender Score: 1               Physical Exam:    VS:  BP (!) 110/57   Pulse 80   Ht 5' 4 (1.626 m)   Wt 207 lb 12.8 oz (94.3 kg)   SpO2 94%   BMI 35.67 kg/m     Wt Readings from Last 3 Encounters:  06/14/24 207 lb  12.8 oz (94.3 kg)  06/08/24 205 lb 11.2 oz (93.3 kg)  06/04/24 204 lb 9 oz (92.8 kg)     GEN:  Well nourished, well developed in no acute distress HEENT: Normal NECK: No JVD; No carotid bruits LYMPHATICS: No lymphadenopathy CARDIAC: IRRR, harsh gr 2/6 systolic murmur RUSB. No  rubs, gallops RESPIRATORY:  Clear to auscultation without rales, wheezing or rhonchi  ABDOMEN: Soft, non-tender, non-distended MUSCULOSKELETAL:  No edema; No deformity  SKIN: Warm and dry NEUROLOGIC:  Alert and oriented x 3 PSYCHIATRIC:  Normal affect   ASSESSMENT:    1. Essential hypertension   2. Persistent atrial fibrillation (HCC)   3. Murmur   4. Paroxysmal atrial fibrillation (HCC)     PLAN:    In order of problems listed above:  Persistent Afib. I suspect she never converted after her hospitalization in April last year. HR controlled on atenolol . She is asymptomatic. I think restoration of NSR would be difficult given duration and LAE. Recommend rate control strategy. She has a high CHAD vasc score of 5. Recommend anticoagulation. Will continue Xarelto . I would have a low threshold for  referral for Watchman LAO device if she has any bleeding or any findings associated with high risk bleeding on GI evaluation.  GI prophylaxis with Protonix  given need for meloxicam . She is clear for colonoscopy - may hold Xarelto  2 days prior. HTN  controlled DM per PCP Cirrhosis. No varices.  OSA  Medication Adjustments/Labs and Tests Ordered: Current medicines are reviewed at length with the patient today.  Concerns regarding medicines are outlined above.  Orders Placed This Encounter  Procedures   EKG 12-Lead   No orders of the defined types were placed in this encounter.   There are no Patient Instructions on file for this visit.   Signed, Jahna Liebert, MD  06/14/2024 10:23 AM    [facility]

## 2024-06-08 NOTE — Telephone Encounter (Signed)
 Request for surgical clearance:     Endoscopy Procedure  What type of surgery is being performed?    Colonoscopy   When is this surgery scheduled?     08/03/24  What type of clearance is required ?   Pharmacy  Are there any medications that need to be held prior to surgery and how long? Xarelto  x2 days prior to procedure   Practice name and name of physician performing surgery?      [facility]  What is your office phone and fax number?      Phone- [phone]  Fax- [phone]  Anesthesia type (None, local, MAC, general) ?       MAC  Please route your response to Blondie Barks, CMA

## 2024-06-09 NOTE — Telephone Encounter (Signed)
 Patient with diagnosis of Afib on Xarelto   for anticoagulation.    Procedure: Colonoscopy  Date of procedure: 08/03/2024   CHA2DS2-VASc Score = 5   This indicates a 7.2% annual risk of stroke. The patient's score is based upon: CHF History: 0 HTN History: 1 Diabetes History: 1 Stroke History: 0 Vascular Disease History: 0 Age Score: 2 Gender Score: 1     CrCl 60 mL/min  Platelet count 156 K   Patient has not had an Afib/aflutter ablation in the last 3 months, DCCV within the last 4 weeks or a watchman implanted in the last 45 days   Per office protocol, patient can hold Xarelto   for 2 days prior to procedure.   Patient will not need bridging with Lovenox (enoxaparin) around procedure.  **This guidance is not considered finalized until pre-operative APP has relayed final recommendations.**

## 2024-06-09 NOTE — Telephone Encounter (Signed)
   Patient Name: Kelsey Keller  DOB: August 19, 1940 MRN: [id number]  Primary Cardiologist: Candyce Reek, MD  Clinical pharmacists have reviewed the patient's past medical history, labs, and current medications as part of preoperative protocol coverage. The following recommendations have been made:  Procedure: Colonoscopy  Date of procedure: 08/03/2024  CHA2DS2-VASc Score = 5   This indicates a 7.2% annual risk of stroke. The patient's score is based upon: CHF History: 0 HTN History: 1 Diabetes History: 1 Stroke History: 0 Vascular Disease History: 0 Age Score: 2 Gender Score: 1   CrCl 60 mL/min  Platelet count 156 K    Patient has not had an Afib/aflutter ablation in the last 3 months, DCCV within the last 4 weeks or a watchman implanted in the last 45 days    Per office protocol, patient can hold Xarelto   for 2 days prior to procedure.  Please resume as soon as safe to do so from a bleeding standpoint. Patient will not need bridging with Lovenox (enoxaparin) around procedure.  I will route this recommendation to the requesting party via Epic fax function and remove from pre-op pool.  Please call with questions.  Aaryn Sermon D Chela Sutphen, NP 06/09/2024, 9:55 AM

## 2024-06-14 ENCOUNTER — Ambulatory Visit: Attending: Cardiology | Admitting: Cardiology

## 2024-06-14 ENCOUNTER — Encounter: Payer: Self-pay | Admitting: Cardiology

## 2024-06-14 VITALS — BP 110/57 | HR 80 | Ht 64.0 in | Wt 207.8 lb

## 2024-06-14 DIAGNOSIS — I4819 Other persistent atrial fibrillation: Secondary | ICD-10-CM | POA: Diagnosis not present

## 2024-06-14 DIAGNOSIS — I10 Essential (primary) hypertension: Secondary | ICD-10-CM | POA: Diagnosis not present

## 2024-06-14 DIAGNOSIS — I480 Paroxysmal atrial fibrillation: Secondary | ICD-10-CM

## 2024-06-14 DIAGNOSIS — R011 Cardiac murmur, unspecified: Secondary | ICD-10-CM

## 2024-06-14 NOTE — Patient Instructions (Signed)
 Medication Instructions:  Continue same medications *If you need a refill on your cardiac medications before your next appointment, please call your pharmacy*  Lab Work: None ordered  Testing/Procedures: None ordered  Follow-Up: At [facility], you and your health needs are our priority.  As part of our continuing mission to provide you with exceptional heart care, our providers are all part of one team.  This team includes your primary Cardiologist (physician) and Advanced Practice Providers or APPs (Physician Assistants and Nurse Practitioners) who all work together to provide you with the care you need, when you need it.  Your next appointment:  6 months  Call in March to schedule June appointment     Provider:  Dr.Jordan   We recommend signing up for the patient portal called MyChart.  Sign up information is provided on this After Visit Summary.  MyChart is used to connect with patients for Virtual Visits (Telemedicine).  Patients are able to view lab/test results, encounter notes, upcoming appointments, etc.  Non-urgent messages can be sent to your provider as well.   To learn more about what you can do with MyChart, go to [email or web address].

## 2024-06-17 NOTE — Telephone Encounter (Signed)
 Per Cardiology-   Per office protocol, patient can hold Xarelto   for 2 days prior to procedure.  Please resume as soon as safe to do so from a bleeding standpoint. Patient will not need bridging with Lovenox (enoxaparin) around procedure. Patient has been informed and voiced understanding.

## 2024-06-29 ENCOUNTER — Telehealth: Payer: Self-pay | Admitting: Gastroenterology

## 2024-06-29 NOTE — Telephone Encounter (Signed)
 Inbound call from patient stating that she is needing a refill of Fergon. Patient states her doctor at the cancer center wants her to take one in the morning and one at night up until her colonoscopy. Patient is requesting a call once medication has been sent. Please advise.

## 2024-07-05 ENCOUNTER — Other Ambulatory Visit

## 2024-07-05 DIAGNOSIS — E538 Deficiency of other specified B group vitamins: Secondary | ICD-10-CM | POA: Diagnosis not present

## 2024-07-05 DIAGNOSIS — E559 Vitamin D deficiency, unspecified: Secondary | ICD-10-CM | POA: Diagnosis not present

## 2024-07-05 DIAGNOSIS — E1165 Type 2 diabetes mellitus with hyperglycemia: Secondary | ICD-10-CM

## 2024-07-05 LAB — LIPID PANEL
Cholesterol: 143 mg/dL (ref 28–200)
HDL: 57.5 mg/dL
LDL Cholesterol: 61 mg/dL (ref 10–99)
NonHDL: 85.65
Total CHOL/HDL Ratio: 2
Triglycerides: 125 mg/dL (ref 10.0–149.0)
VLDL: 25 mg/dL (ref 0.0–40.0)

## 2024-07-05 LAB — BASIC METABOLIC PANEL WITH GFR
BUN: 31 mg/dL — ABNORMAL HIGH (ref 6–23)
CO2: 28 meq/L (ref 19–32)
Calcium: 9.8 mg/dL (ref 8.4–10.5)
Chloride: 105 meq/L (ref 96–112)
Creatinine, Ser: 1.17 mg/dL (ref 0.40–1.20)
GFR: 43.71 mL/min — ABNORMAL LOW
Glucose, Bld: 143 mg/dL — ABNORMAL HIGH (ref 70–99)
Potassium: 4.1 meq/L (ref 3.5–5.1)
Sodium: 140 meq/L (ref 135–145)

## 2024-07-05 LAB — HEPATIC FUNCTION PANEL
ALT: 11 U/L (ref 3–35)
AST: 21 U/L (ref 5–37)
Albumin: 4.1 g/dL (ref 3.5–5.2)
Alkaline Phosphatase: 80 U/L (ref 39–117)
Bilirubin, Direct: 0.2 mg/dL (ref 0.1–0.3)
Total Bilirubin: 0.7 mg/dL (ref 0.2–1.2)
Total Protein: 6.9 g/dL (ref 6.0–8.3)

## 2024-07-05 LAB — VITAMIN D 25 HYDROXY (VIT D DEFICIENCY, FRACTURES): VITD: 40.19 ng/mL (ref 30.00–100.00)

## 2024-07-05 LAB — HEMOGLOBIN A1C: Hgb A1c MFr Bld: 6.5 % (ref 4.6–6.5)

## 2024-07-05 LAB — VITAMIN B12: Vitamin B-12: 553 pg/mL (ref 211–911)

## 2024-07-06 MED ORDER — FERROUS GLUCONATE 324 (38 FE) MG PO TABS: 324.0000 mg | ORAL_TABLET | Freq: Two times a day (BID) | ORAL | 0 refills | Status: AC

## 2024-07-06 NOTE — Telephone Encounter (Signed)
 Called and spoke with patient. She states hem/oncology would like for her to increase iron to BID. Prescription was changed to BID. Informed patient that she will need to hold iron 1 week prior to procedure. If she develops constipation, then she needs to go back to once daily dosing and let our office know. Patient voiced understanding.

## 2024-07-21 ENCOUNTER — Ambulatory Visit: Admitting: Internal Medicine

## 2024-07-21 ENCOUNTER — Encounter: Payer: Self-pay | Admitting: Internal Medicine

## 2024-07-21 VITALS — BP 136/78 | HR 72 | Temp 98.1°F | Ht 64.0 in | Wt 203.0 lb

## 2024-07-21 DIAGNOSIS — Z23 Encounter for immunization: Secondary | ICD-10-CM | POA: Diagnosis not present

## 2024-07-21 DIAGNOSIS — E039 Hypothyroidism, unspecified: Secondary | ICD-10-CM | POA: Diagnosis not present

## 2024-07-21 DIAGNOSIS — N1831 Chronic kidney disease, stage 3a: Secondary | ICD-10-CM | POA: Diagnosis not present

## 2024-07-21 DIAGNOSIS — I10 Essential (primary) hypertension: Secondary | ICD-10-CM | POA: Diagnosis not present

## 2024-07-21 DIAGNOSIS — E7800 Pure hypercholesterolemia, unspecified: Secondary | ICD-10-CM

## 2024-07-21 DIAGNOSIS — E1122 Type 2 diabetes mellitus with diabetic chronic kidney disease: Secondary | ICD-10-CM | POA: Diagnosis not present

## 2024-07-21 DIAGNOSIS — Z7984 Long term (current) use of oral hypoglycemic drugs: Secondary | ICD-10-CM | POA: Diagnosis not present

## 2024-07-21 DIAGNOSIS — E559 Vitamin D deficiency, unspecified: Secondary | ICD-10-CM

## 2024-07-21 DIAGNOSIS — E538 Deficiency of other specified B group vitamins: Secondary | ICD-10-CM | POA: Diagnosis not present

## 2024-07-21 MED ORDER — TRAMADOL HCL 50 MG PO TABS: 50.0000 mg | ORAL_TABLET | Freq: Three times a day (TID) | ORAL | 5 refills | Status: AC | PRN

## 2024-07-21 MED ORDER — METFORMIN HCL 1000 MG PO TABS: 1000.0000 mg | ORAL_TABLET | Freq: Two times a day (BID) | ORAL | 3 refills | Status: AC

## 2024-07-21 MED ORDER — PIOGLITAZONE HCL 30 MG PO TABS: 30.0000 mg | ORAL_TABLET | Freq: Every day | ORAL | 3 refills | Status: AC

## 2024-07-21 MED ORDER — HYDROCHLOROTHIAZIDE 25 MG PO TABS: 25.0000 mg | ORAL_TABLET | Freq: Every day | ORAL | 3 refills | Status: AC

## 2024-07-21 MED ORDER — LEVOTHYROXINE SODIUM 125 MCG PO TABS: ORAL_TABLET | ORAL | 3 refills | Status: AC

## 2024-07-21 MED ORDER — OMEPRAZOLE 40 MG PO CPDR: 40.0000 mg | DELAYED_RELEASE_CAPSULE | Freq: Two times a day (BID) | ORAL | 5 refills | Status: AC

## 2024-07-21 NOTE — Assessment & Plan Note (Signed)
 Last vitamin D  Lab Results  Component Value Date   VD25OH 40.19 07/05/2024   Stable, cont oral replacement

## 2024-07-21 NOTE — Assessment & Plan Note (Signed)
 Lab Results  Component Value Date   VITAMINB12 553 07/05/2024   Stable, cont oral replacement - b12 1000 mcg qd

## 2024-07-21 NOTE — Patient Instructions (Signed)
 You had the Hep A and B shot #3 today  Please continue all other medications as before, and refills have been done if requested.  Please have the pharmacy call with any other refills you may need.  Please continue your efforts at being more active, low cholesterol diet, and weight control.  Please keep your appointments with your specialists as you may have planned  Your lab work was excellent!  Please make an Appointment to return in 6 months, or sooner if needed

## 2024-07-21 NOTE — Progress Notes (Signed)
 Patient ID: Kelsey Keller, female   DOB: [birth year], 84 y.o.   MRN: [id number]        Chief Complaint: follow up HTN, HLD, DM, low vit d       HPI:  Kelsey Keller is a 84 y.o. female here overall doing ok, Pt denies chest pain, increased sob or doe, wheezing, orthopnea, PND, increased LE swelling, palpitations, dizziness or syncope.   Pt denies polydipsia, polyuria, or new focal neuro s/s.    Pt denies fever, wt loss, night sweats, loss of appetite, or other constitutional symptoms  Due for Hep A/B shot #3 today       Wt Readings from Last 3 Encounters:  07/21/24 203 lb (92.1 kg)  06/14/24 207 lb 12.8 oz (94.3 kg)  06/08/24 205 lb 11.2 oz (93.3 kg)   BP Readings from Last 3 Encounters:  07/21/24 136/78  06/14/24 (!) 110/57  06/08/24 138/78         Past Medical History:  Diagnosis Date   A-fib [facility])    ALLERGIC RHINITIS 04/29/2007   Allergy    Basal cell carcinoma 03/24/2018   sclerosis- upper bridge of nose ([facility])   BCC (basal cell carcinoma) 10/18/2019   right supra orbital region- Cx3 42fu   Cataract    both eyes   DIABETES MELLITUS, TYPE II 06/23/2007   type 2   Elevated liver enzymes 03/2017   Elevated tumor markers    GERD (gastroesophageal reflux disease)    Heart murmur    per patient   HIATAL HERNIA, HX OF 04/29/2007   History of hiatal hernia    HYPERLIPIDEMIA 04/29/2007   diet controlled on CoQ10   HYPERTENSION 04/29/2007   HYPOKALEMIA 05/02/2009   HYPOTHYROIDISM 04/29/2007   LOW BACK PAIN 04/29/2007   Neuromuscular disorder (HCC)    diabetic neuropathy in both legs   OSTEOARTHRITIS, KNEE, LEFT 06/23/2007   OSTEOPOROSIS NOS 05/12/2007   SPINAL STENOSIS, LUMBAR 04/29/2007   Past Surgical History:  Procedure Laterality Date   BACK SURGERY N/A 2014 x 2   T1 TO S 1    BACK SURGERY     CATARACT EXTRACTION W/ INTRAOCULAR LENS  IMPLANT, BILATERAL Bilateral 05/2011   doing well   COLONOSCOPY WITH PROPOFOL  N/A 04/10/2017   Procedure: COLONOSCOPY WITH  PROPOFOL ;  Surgeon: Teressa Toribio SQUIBB, MD;  Location: [facility];  Service: Endoscopy;  Laterality: N/A;   COLONSCOPY  2011   JOINT REPLACEMENT     TOTAL KNEE ARTHROPLASTY  2006   right   TOTAL KNEE ARTHROPLASTY Left 2012    reports that she has never smoked. She has never used smokeless tobacco. She reports current alcohol  use. She reports that she does not use drugs. family history includes Arthritis in her brother and mother; COPD in her brother; Colon polyps in an other family member; Coronary artery disease in her father and another family member; Diabetes in an other family member; Hyperlipidemia in her brother; Hypertension in her mother; Stroke in her brother. Allergies[1] Medications Ordered Prior to Encounter[2]      ROS:  All others reviewed and negative.  Objective        PE:  BP 136/78 (BP Location: Left Arm, Patient Position: Sitting, Cuff Size: Normal)   Pulse 72   Temp 98.1 F (36.7 C) (Oral)   Ht 5' 4 (1.626 m)   Wt 203 lb (92.1 kg)   SpO2 95%   BMI 34.84 kg/m  Constitutional: Pt appears in NAD               HENT: Head: NCAT.                Right Ear: External ear normal.                 Left Ear: External ear normal.                Eyes: . Pupils are equal, round, and reactive to light. Conjunctivae and EOM are normal               Nose: without d/c or deformity               Neck: Neck supple. Gross normal ROM               Cardiovascular: Normal rate and regular rhythm.                 Pulmonary/Chest: Effort normal and breath sounds without rales or wheezing.                Abd:  Soft, NT, ND, + BS, no organomegaly               Neurological: Pt is alert. At baseline orientation, motor grossly intact               Skin: Skin is warm. No rashes, no other new lesions, LE edema - none               Psychiatric: Pt behavior is normal without agitation   Micro: none  Cardiac tracings I have personally interpreted today:  none  Pertinent  Radiological findings (summarize): none   Lab Results  Component Value Date   WBC 4.8 05/03/2024   HGB 9.5 (L) 05/03/2024   HCT 30.5 (L) 05/03/2024   PLT 156.0 05/03/2024   GLUCOSE 143 (H) 07/05/2024   CHOL 143 07/05/2024   TRIG 125.0 07/05/2024   HDL 57.50 07/05/2024   LDLDIRECT 107.5 07/21/2013   LDLCALC 61 07/05/2024   ALT 11 07/05/2024   AST 21 07/05/2024   NA 140 07/05/2024   K 4.1 07/05/2024   CL 105 07/05/2024   CREATININE 1.17 07/05/2024   BUN 31 (H) 07/05/2024   CO2 28 07/05/2024   TSH 1.42 12/17/2023   INR 1.6 (H) 05/03/2024   HGBA1C 6.5 07/05/2024   MICROALBUR 2.4 (H) 12/17/2023   Assessment/Plan:  Kelsey Keller is a 84 y.o. White or Caucasian [1] female with  has a past medical history of A-fib [facility]), ALLERGIC RHINITIS (04/29/2007), Allergy, Basal cell carcinoma (03/24/2018), BCC (basal cell carcinoma) (10/18/2019), Cataract, DIABETES MELLITUS, TYPE II (06/23/2007), Elevated liver enzymes (03/2017), Elevated tumor markers, GERD (gastroesophageal reflux disease), Heart murmur, HIATAL HERNIA, HX OF (04/29/2007), History of hiatal hernia, HYPERLIPIDEMIA (04/29/2007), HYPERTENSION (04/29/2007), HYPOKALEMIA (05/02/2009), HYPOTHYROIDISM (04/29/2007), LOW BACK PAIN (04/29/2007), Neuromuscular disorder (HCC), OSTEOARTHRITIS, KNEE, LEFT (06/23/2007), OSTEOPOROSIS NOS (05/12/2007), and SPINAL STENOSIS, LUMBAR (04/29/2007).  Diabetes mellitus with chronic kidney disease (HCC) Without insulin , with CKD3a  Lab Results  Component Value Date   HGBA1C 6.5 07/05/2024   Stable, pt to continue current medical treatment farxiga  10 every day, metformin  1000 bid, actos  30 qd   Vitamin D  deficiency Last vitamin D  Lab Results  Component Value Date   VD25OH 40.19 07/05/2024   Stable, cont oral replacement   Hypothyroidism Lab Results  Component Value Date   TSH 1.42 12/17/2023   Stable, pt  to continue levothyroxine  125 mcg qd   Hyperlipidemia Lab Results  Component  Value Date   LDLCALC 61 07/05/2024   Stable, pt to continue current statin  - diet, wt control   Essential hypertension BP Readings from Last 3 Encounters:  07/21/24 136/78  06/14/24 (!) 110/57  06/08/24 138/78   Stable, pt to continue medical treatment tenormin  50 mg every day, hct 25 mg qd   B12 deficiency Lab Results  Component Value Date   VITAMINB12 553 07/05/2024   Stable, cont oral replacement - b12 1000 mcg qd  Followup: Return in about 6 months (around 01/18/2025).  Lynwood Rush, MD 07/21/2024 9:06 PM [facility]     [1] No Known Allergies [2]  Current Outpatient Medications on File Prior to Visit  Medication Sig Dispense Refill   atenolol  (TENORMIN ) 50 MG tablet Take 1 tablet (50 mg total) by mouth daily. 90 tablet 3   augmented betamethasone dipropionate (DIPROLENE-AF) 0.05 % cream Apply 1 Application topically 2 (two) times daily.     blood glucose meter kit and supplies KIT Dispense with patient  Insurance; Use up to four times daily as directed. E11.9 1 each 0   Blood Glucose Monitoring Suppl (ONE TOUCH ULTRA 2) w/Device KIT Use as directed up to 4 times per day E11.9 1 each 0   brimonidine (ALPHAGAN) 0.2 % ophthalmic solution 1 drop 2 (two) times daily.     Cholecalciferol (VITAMIN D3) 50 MCG (2000 UT) TABS Take 2,000 Units by mouth daily.     cyanocobalamin  1000 MCG tablet Take 1,000 mcg by mouth daily.     dapagliflozin  propanediol (FARXIGA ) 10 MG TABS tablet Take 1 tablet (10 mg total) by mouth daily before breakfast. 90 tablet 3   ferrous gluconate  (FERGON) 324 MG tablet Take 1 tablet (324 mg total) by mouth 2 (two) times daily with a meal. 180 tablet 0   fluticasone  (FLONASE ) 50 MCG/ACT nasal spray SPRAY 2 SPRAYS INTO EACH NOSTRIL EVERY DAY 48 mL 3   glucose blood (ONETOUCH VERIO) test strip USE TO CHECK BLOOD SUGARS TWICE A DAY 200 strip 0   Lancets MISC Use to check blood sugars twice a  day 200 each 0   latanoprost (XALATAN) 0.005 % ophthalmic solution SMARTSIG:1 Drop(s) In Eye(s) Every Evening     Magnesium  250 MG TABS Take 250 mg by mouth daily.     meloxicam  (MOBIC ) 7.5 MG tablet TAKE 1 TABLET BY MOUTH EVERY DAY AS NEEDED FOR PAIN 90 tablet 1   Menthol , Topical Analgesic, (BENGAY EX) Apply 1 Application topically daily as needed (arthritis pain).     Na Sulfate-K Sulfate-Mg Sulfate concentrate (SUPREP BOWEL PREP KIT) 17.5-3.13-1.6 GM/177ML SOLN Take 1 kit (354 mLs total) by mouth as directed. For colonoscopy prep 354 mL 0   rivaroxaban  (XARELTO ) 20 MG TABS tablet Take 1 tablet (20 mg total) by mouth daily with supper. 90 tablet 3   No current facility-administered medications on file prior to visit.

## 2024-07-21 NOTE — Assessment & Plan Note (Signed)
 Lab Results  Component Value Date   LDLCALC 61 07/05/2024   Stable, pt to continue current statin  - diet, wt control

## 2024-07-21 NOTE — Assessment & Plan Note (Addendum)
 Without insulin , with CKD3a  Lab Results  Component Value Date   HGBA1C 6.5 07/05/2024   Stable, pt to continue current medical treatment farxiga  10 every day, metformin  1000 bid, actos  30 qd

## 2024-07-21 NOTE — Assessment & Plan Note (Signed)
 Lab Results  Component Value Date   TSH 1.42 12/17/2023   Stable, pt to continue levothyroxine  125 mcg qd

## 2024-07-21 NOTE — Assessment & Plan Note (Signed)
 BP Readings from Last 3 Encounters:  07/21/24 136/78  06/14/24 (!) 110/57  06/08/24 138/78   Stable, pt to continue medical treatment tenormin  50 mg every day, hct 25 mg qd

## 2024-07-27 ENCOUNTER — Encounter: Payer: Self-pay | Admitting: Gastroenterology

## 2024-08-03 ENCOUNTER — Ambulatory Visit: Admitting: Gastroenterology

## 2024-08-03 ENCOUNTER — Encounter: Payer: Self-pay | Admitting: Gastroenterology

## 2024-08-03 VITALS — BP 120/63 | HR 77 | Temp 97.2°F | Resp 11 | Ht 64.0 in | Wt 204.0 lb

## 2024-08-03 DIAGNOSIS — D509 Iron deficiency anemia, unspecified: Secondary | ICD-10-CM

## 2024-08-03 DIAGNOSIS — Q439 Congenital malformation of intestine, unspecified: Secondary | ICD-10-CM | POA: Diagnosis not present

## 2024-08-03 DIAGNOSIS — D120 Benign neoplasm of cecum: Secondary | ICD-10-CM

## 2024-08-03 DIAGNOSIS — K644 Residual hemorrhoidal skin tags: Secondary | ICD-10-CM | POA: Diagnosis not present

## 2024-08-03 DIAGNOSIS — K641 Second degree hemorrhoids: Secondary | ICD-10-CM | POA: Diagnosis not present

## 2024-08-03 DIAGNOSIS — K635 Polyp of colon: Secondary | ICD-10-CM | POA: Diagnosis not present

## 2024-08-03 DIAGNOSIS — D121 Benign neoplasm of appendix: Secondary | ICD-10-CM

## 2024-08-03 DIAGNOSIS — D122 Benign neoplasm of ascending colon: Secondary | ICD-10-CM

## 2024-08-03 MED ORDER — SODIUM CHLORIDE 0.9 % IV SOLN: 500.0000 mL | INTRAVENOUS | Status: DC

## 2024-08-03 NOTE — Progress Notes (Signed)
 To pacu, VSS. Report to [email or web address]

## 2024-08-03 NOTE — Op Note (Addendum)
 [facility] Patient Name: Kelsey Keller Procedure Date: 08/03/2024 1:54 PM MRN: [id number] Endoscopist: Aloha Finner , MD, [id number] Age: 84 Referring MD:  Date of Birth: 1940/09/03 Gender: Female Account #: [id number] Procedure:                Colonoscopy Indications:              Iron deficiency anemia Medicines:                Monitored Anesthesia Care Procedure:                Pre-Anesthesia Assessment:                           - Prior to the procedure, a History and Physical                            was performed, and patient medications and                            allergies were reviewed. The patient's tolerance of                            previous anesthesia was also reviewed. The risks                            and benefits of the procedure and the sedation                            options and risks were discussed with the patient.                            All questions were answered, and informed consent                            was obtained. Prior Anticoagulants: The patient has                            taken Xarelto  (rivaroxaban ), last dose was 2 days                            prior to procedure. ASA Grade Assessment: III - A                            patient with severe systemic disease. After                            reviewing the risks and benefits, the patient was                            deemed in satisfactory condition to undergo the                            procedure.  After obtaining informed consent, the colonoscope                            was passed under direct vision. Throughout the                            procedure, the patient's blood pressure, pulse, and                            oxygen saturations were monitored continuously. The                            Olympus Scope SN: [id number] was introduced through                            the anus and advanced to the the cecum, identified                             by appendiceal orifice and ileocecal valve. The                            colonoscopy was somewhat difficult due to a                            tortuous colon. Successful completion of the                            procedure was aided by changing the patient's                            position, using manual pressure, straightening and                            shortening the scope to obtain bowel loop reduction                            and using scope torsion. The patient tolerated the                            procedure. The quality of the bowel preparation was                            adequate. The ileocecal valve, appendiceal orifice,                            and rectum were photographed. Scope In: 2:13:11 PM Scope Out: 2:33:14 PM Scope Withdrawal Time: 0 hours 13 minutes 45 seconds  Total Procedure Duration: 0 hours 20 minutes 3 seconds  Findings:                 The digital rectal exam findings include                            hemorrhoids. Pertinent negatives include no  palpable rectal lesions.                           The left colon was moderately tortuous.                           Four sessile polyps were found in the ascending                            colon (2), cecum (1) and appendiceal orifice (1 -                            this did not invade deeply into the orifice                            itself). The polyps were 4 to 14 mm in size. These                            polyps were removed with a cold snare. Resection                            and retrieval were complete.                           Normal mucosa was found in the entire colon                            otherwise.                           Non-bleeding non-thrombosed external and internal                            hemorrhoids were found during retroflexion, during                            perianal exam and during digital exam. The                             hemorrhoids were Grade II (internal hemorrhoids                            that prolapse but reduce spontaneously). Complications:            No immediate complications. Estimated Blood Loss:     Estimated blood loss was minimal. Impression:               - Hemorrhoids found on digital rectal exam.                           - Tortuous left colon.                           - Four 4 to 14 mm polyps in the ascending colon, in  the cecum and at the appendiceal orifice, removed                            with a cold snare. Resected and retrieved.                           - Normal mucosa in the entire examined colon                            otherwise.                           - Non-bleeding non-thrombosed external and internal                            hemorrhoids. Recommendation:           - The patient will be observed post-procedure,                            until all discharge criteria are met.                           - Discharge patient to home.                           - Patient has a contact number available for                            emergencies. The signs and symptoms of potential                            delayed complications were discussed with the                            patient. Return to normal activities tomorrow.                            Written discharge instructions were provided to the                            patient.                           - May restart Xarelto  on 1/22 PM to decrease risk                            of bleeding post-intervention.                           - Use FiberCon 1-2 tablets PO daily.                           - Continue present medications otherwise.                           - Await  pathology results.                           - Recommend that VCE be considered for this patient                            for further evaluation of IDA as source not noted                             on today's examination.                           - Repeat colonoscopy in 3 years for surveillance                            would normally be recommended but with patient's                            age and comorbidities, unlikely further procedures                            will be recommended. Pediatric colonoscope could be                            considered however.                           - The findings and recommendations were discussed                            with the patient.                           - The findings and recommendations were discussed                            with the patient's family. Aloha Finner, MD 08/03/2024 2:41:48 PM

## 2024-08-03 NOTE — Progress Notes (Signed)
 Called to room to assist during endoscopic procedure.  Patient ID and intended procedure confirmed with present staff. Received instructions for my participation in the procedure from the performing physician.

## 2024-08-03 NOTE — Patient Instructions (Signed)
 RESTART Xarelto  on 1/22 PM to decrease risk of bleeding post-intervention.  Use FiberCon 1-2 tablets by mouth daily.  YOU HAD AN ENDOSCOPIC PROCEDURE TODAY AT THE [facility]:   Refer to the procedure report that was given to you for any specific questions about what was found during the examination.  If the procedure report does not answer your questions, please call your gastroenterologist to clarify.  If you requested that your care partner not be given the details of your procedure findings, then the procedure report has been included in a sealed envelope for you to review at your convenience later.  YOU SHOULD EXPECT: Some feelings of bloating in the abdomen. Passage of more gas than usual.  Walking can help get rid of the air that was put into your GI tract during the procedure and reduce the bloating. If you had a lower endoscopy (such as a colonoscopy or flexible sigmoidoscopy) you may notice spotting of blood in your stool or on the toilet paper. If you underwent a bowel prep for your procedure, you may not have a normal bowel movement for a few days.  Please Note:  You might notice some irritation and congestion in your nose or some drainage.  This is from the oxygen used during your procedure.  There is no need for concern and it should clear up in a day or so.  SYMPTOMS TO REPORT IMMEDIATELY:  Following lower endoscopy (colonoscopy or flexible sigmoidoscopy):  Excessive amounts of blood in the stool  Significant tenderness or worsening of abdominal pains  Swelling of the abdomen that is new, acute  Fever of 100F or higher  For urgent or emergent issues, a gastroenterologist can be reached at any hour by calling [phone]. Do not use MyChart messaging for urgent concerns.    DIET:  We do recommend a small meal at first, but then you may proceed to your regular diet.  Drink plenty of fluids but you should avoid alcoholic beverages for 24 hours.  ACTIVITY:  You  should plan to take it easy for the rest of today and you should NOT DRIVE or use heavy machinery until tomorrow (because of the sedation medicines used during the test).    FOLLOW UP: Our staff will call the number listed on your records the next business day following your procedure.  We will call around 7:15- 8:00 am to check on you and address any questions or concerns that you may have regarding the information given to you following your procedure. If we do not reach you, we will leave a message.     If any biopsies were taken you will be contacted by phone or by letter within the next 1-3 weeks.  Please call us  at [phone] if you have not heard about the biopsies in 3 weeks.    SIGNATURES/CONFIDENTIALITY: You and/or your care partner have signed paperwork which will be entered into your electronic medical record.  These signatures attest to the fact that that the information above on your After Visit Summary has been reviewed and is understood.  Full responsibility of the confidentiality of this discharge information lies with you and/or your care-partner.

## 2024-08-03 NOTE — Progress Notes (Signed)
 "  GASTROENTEROLOGY PROCEDURE H&P NOTE   Primary Care Physician: Norleen Lynwood ORN, MD  HPI: Kelsey Keller is a 84 y.o. female who presents for Colonoscopy for further evaluation of IDA.  Past Medical History:  Diagnosis Date   A-fib [facility])    ALLERGIC RHINITIS 04/29/2007   Allergy    Basal cell carcinoma 03/24/2018   sclerosis- upper bridge of nose ([facility])   BCC (basal cell carcinoma) 10/18/2019   right supra orbital region- Cx3 80fu   Cataract    both eyes   DIABETES MELLITUS, TYPE II 06/23/2007   type 2   Elevated liver enzymes 03/2017   Elevated tumor markers    GERD (gastroesophageal reflux disease)    Heart murmur    per patient   HIATAL HERNIA, HX OF 04/29/2007   History of hiatal hernia    HYPERLIPIDEMIA 04/29/2007   diet controlled on CoQ10   HYPERTENSION 04/29/2007   HYPOKALEMIA 05/02/2009   HYPOTHYROIDISM 04/29/2007   LOW BACK PAIN 04/29/2007   Neuromuscular disorder (HCC)    diabetic neuropathy in both legs   OSTEOARTHRITIS, KNEE, LEFT 06/23/2007   OSTEOPOROSIS NOS 05/12/2007   SPINAL STENOSIS, LUMBAR 04/29/2007   Past Surgical History:  Procedure Laterality Date   BACK SURGERY N/A 2014 x 2   T1 TO S 1    BACK SURGERY     CATARACT EXTRACTION W/ INTRAOCULAR LENS  IMPLANT, BILATERAL Bilateral 05/2011   doing well   COLONOSCOPY WITH PROPOFOL  N/A 04/10/2017   Procedure: COLONOSCOPY WITH PROPOFOL ;  Surgeon: Teressa Toribio SQUIBB, MD;  Location: [facility];  Service: Endoscopy;  Laterality: N/A;   COLONSCOPY  2011   JOINT REPLACEMENT     TOTAL KNEE ARTHROPLASTY  2006   right   TOTAL KNEE ARTHROPLASTY Left 2012   Current Outpatient Medications  Medication Sig Dispense Refill   atenolol  (TENORMIN ) 50 MG tablet Take 1 tablet (50 mg total) by mouth daily. 90 tablet 3   augmented betamethasone dipropionate (DIPROLENE-AF) 0.05 % cream Apply 1 Application topically 2 (two) times daily.     blood glucose meter kit and supplies KIT Dispense with patient  Insurance;  Use up to four times daily as directed. E11.9 1 each 0   Blood Glucose Monitoring Suppl (ONE TOUCH ULTRA 2) w/Device KIT Use as directed up to 4 times per day E11.9 1 each 0   brimonidine (ALPHAGAN) 0.2 % ophthalmic solution 1 drop 2 (two) times daily.     Cholecalciferol (VITAMIN D3) 50 MCG (2000 UT) TABS Take 2,000 Units by mouth daily.     cyanocobalamin  1000 MCG tablet Take 1,000 mcg by mouth daily.     dapagliflozin  propanediol (FARXIGA ) 10 MG TABS tablet Take 1 tablet (10 mg total) by mouth daily before breakfast. 90 tablet 3   ferrous gluconate  (FERGON) 324 MG tablet Take 1 tablet (324 mg total) by mouth 2 (two) times daily with a meal. 180 tablet 0   fluticasone  (FLONASE ) 50 MCG/ACT nasal spray SPRAY 2 SPRAYS INTO EACH NOSTRIL EVERY DAY 48 mL 3   glucose blood (ONETOUCH VERIO) test strip USE TO CHECK BLOOD SUGARS TWICE A DAY 200 strip 0   hydrochlorothiazide  (HYDRODIURIL ) 25 MG tablet Take 1 tablet (25 mg total) by mouth daily. 90 tablet 3   Lancets MISC Use to check blood sugars twice a day 200 each 0   latanoprost (XALATAN) 0.005 % ophthalmic solution SMARTSIG:1 Drop(s) In Eye(s) Every Evening     levothyroxine  (SYNTHROID ) 125 MCG tablet TAKE 1  TABLET BY MOUTH EVERY DAY BEFORE BREAKFAST 90 tablet 3   Magnesium  250 MG TABS Take 250 mg by mouth daily.     meloxicam  (MOBIC ) 7.5 MG tablet TAKE 1 TABLET BY MOUTH EVERY DAY AS NEEDED FOR PAIN 90 tablet 1   Menthol , Topical Analgesic, (BENGAY EX) Apply 1 Application topically daily as needed (arthritis pain).     metFORMIN  (GLUCOPHAGE ) 1000 MG tablet Take 1 tablet (1,000 mg total) by mouth 2 (two) times daily with a meal. 180 tablet 3   Na Sulfate-K Sulfate-Mg Sulfate concentrate (SUPREP BOWEL PREP KIT) 17.5-3.13-1.6 GM/177ML SOLN Take 1 kit (354 mLs total) by mouth as directed. For colonoscopy prep 354 mL 0   omeprazole  (PRILOSEC) 40 MG capsule Take 1 capsule (40 mg total) by mouth in the morning and at bedtime. Twice a day for 2 months, then  decrease to once daily. If started on blood thinners continue twice daily. 120 capsule 5   pioglitazone  (ACTOS ) 30 MG tablet Take 1 tablet (30 mg total) by mouth daily. 90 tablet 3   rivaroxaban  (XARELTO ) 20 MG TABS tablet Take 1 tablet (20 mg total) by mouth daily with supper. 90 tablet 3   traMADol  (ULTRAM ) 50 MG tablet Take 1 tablet (50 mg total) by mouth every 8 (eight) hours as needed. 90 tablet 5   No current facility-administered medications for this visit.   Current Medications[1] Allergies[2] Family History  Problem Relation Age of Onset   Arthritis Mother    Hypertension Mother    Coronary artery disease Father    Arthritis Brother    Stroke Brother    Hyperlipidemia Brother    COPD Brother    Coronary artery disease Other        Female 1st degree relative   Diabetes Other        1st degree relative   Colon polyps Other    Colon cancer Neg Hx    Esophageal cancer Neg Hx    Rectal cancer Neg Hx    Stomach cancer Neg Hx    Inflammatory bowel disease Neg Hx    Liver disease Neg Hx    Pancreatic cancer Neg Hx    BRCA 1/2 Neg Hx    Breast cancer Neg Hx    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  Tobacco Use   Smoking status: Never   Smokeless tobacco: Never  Vaping Use   Vaping status: Never Used  Substance and Sexual Activity   Alcohol  use: Yes    Alcohol /week: 0.0 standard drinks of alcohol     Comment: One or two drinks in month   Drug use: No   Sexual activity: Not Currently  Other Topics Concern   Not on file  Social History Narrative   Not on file   Social Drivers of Health   Tobacco Use: Low Risk (07/21/2024)   Patient History    Smoking Tobacco Use: Never    Smokeless Tobacco Use: Never    Passive Exposure: Not on file   Financial Resource Strain: Low Risk (07/17/2024)   Overall Financial Resource Strain (CARDIA)    Difficulty of Paying Living Expenses: Not very hard  Food Insecurity: No Food Insecurity (07/17/2024)   Epic  Worried About Programme Researcher, Broadcasting/film/video in the Last Year: Never true    Ran Out of Food in the Last Year: Never true  Transportation Needs: No Transportation Needs (07/17/2024)   Epic    Lack of Transportation (Medical): No    Lack of Transportation (Non-Medical): No  Physical Activity: Inactive (07/17/2024)   Exercise Vital Sign    Days of Exercise per Week: 0 days    Minutes of Exercise per Session: Not on file  Stress: No Stress Concern Present (07/17/2024)   [employer] of Occupational Health - Occupational Stress Questionnaire    Feeling of Stress: Not at all  Social Connections: Moderately Integrated (07/17/2024)   Social Connection and Isolation Panel    Frequency of Communication with Friends and Family: Not on file    Frequency of Social Gatherings with Friends and Family: More than three times a week    Attends Religious Services: More than 4 times per year    Active Member of Clubs or Organizations: Yes    Attends Banker Meetings: More than 4 times per year    Marital Status: Divorced  Intimate Partner Violence: Unknown (06/08/2024)   Epic    Fear of Current or Ex-Partner: Not on file    Emotionally Abused: No    Physically Abused: No    Sexually Abused: No  Depression (PHQ2-9): Low Risk (07/21/2024)   Depression (PHQ2-9)    PHQ-2 Score: 0  Alcohol  Screen: Low Risk (07/17/2024)   Alcohol  Screen    Last Alcohol  Screening Score (AUDIT): 1  Housing: Low Risk (07/17/2024)   Epic    Unable to Pay for Housing in the Last Year: No    Number of Times Moved in the Last Year: 0    Homeless in the Last Year: No  Utilities: Not At Risk (06/08/2024)   Epic    Threatened with loss of utilities: No  Health Literacy: Adequate Health Literacy (12/09/2023)   B1300 Health  Literacy    Frequency of need for help with medical instructions: Never    Physical Exam: There were no vitals filed for this visit. There is no height or weight on file to calculate BMI. GEN: NAD EYE: Sclerae anicteric ENT: MMM CV: Non-tachycardic GI: Soft, NT/ND NEURO:  Alert & Oriented x 3  Lab Results: No results for input(s): WBC, HGB, HCT, PLT in the last 72 hours. BMET No results for input(s): NA, K, CL, CO2, GLUCOSE, BUN, CREATININE, CALCIUM in the last 72 hours. LFT No results for input(s): PROT, ALBUMIN , AST, ALT, ALKPHOS, BILITOT, BILIDIR, IBILI in the last 72 hours. PT/INR No results for input(s): LABPROT, INR in the last 72 hours.   Impression / Plan: This is a 84 y.o.female who presents for Colonoscopy for further evaluation of IDA.  The risks and benefits of endoscopic evaluation/treatment were discussed with the patient and/or family; these include but are not limited to the risk of perforation, infection, bleeding, missed lesions, lack of diagnosis, severe illness requiring hospitalization, as well as anesthesia and sedation related illnesses.  The patient's history has been reviewed, patient examined, no change in status, and deemed stable for procedure.  The patient and/or family was provided an opportunity to ask questions and all were answered.  The patient and/or family is agreeable to proceed.    Aloha Finner, MD  Gastroenterology Advanced Endoscopy Office # [phone]    [1]  Current Outpatient Medications:    atenolol  (TENORMIN ) 50 MG tablet, Take 1 tablet (50 mg  total) by mouth daily., Disp: 90 tablet, Rfl: 3   augmented betamethasone dipropionate (DIPROLENE-AF) 0.05 % cream, Apply 1 Application topically 2 (two) times daily., Disp: , Rfl:    blood glucose meter kit and supplies KIT, Dispense with patient  Insurance; Use up to four times daily as directed. E11.9, Disp: 1 each, Rfl: 0   Blood  Glucose Monitoring Suppl (ONE TOUCH ULTRA 2) w/Device KIT, Use as directed up to 4 times per day E11.9, Disp: 1 each, Rfl: 0   brimonidine (ALPHAGAN) 0.2 % ophthalmic solution, 1 drop 2 (two) times daily., Disp: , Rfl:    Cholecalciferol (VITAMIN D3) 50 MCG (2000 UT) TABS, Take 2,000 Units by mouth daily., Disp: , Rfl:    cyanocobalamin  1000 MCG tablet, Take 1,000 mcg by mouth daily., Disp: , Rfl:    dapagliflozin  propanediol (FARXIGA ) 10 MG TABS tablet, Take 1 tablet (10 mg total) by mouth daily before breakfast., Disp: 90 tablet, Rfl: 3   ferrous gluconate  (FERGON) 324 MG tablet, Take 1 tablet (324 mg total) by mouth 2 (two) times daily with a meal., Disp: 180 tablet, Rfl: 0   fluticasone  (FLONASE ) 50 MCG/ACT nasal spray, SPRAY 2 SPRAYS INTO EACH NOSTRIL EVERY DAY, Disp: 48 mL, Rfl: 3   glucose blood (ONETOUCH VERIO) test strip, USE TO CHECK BLOOD SUGARS TWICE A DAY, Disp: 200 strip, Rfl: 0   hydrochlorothiazide  (HYDRODIURIL ) 25 MG tablet, Take 1 tablet (25 mg total) by mouth daily., Disp: 90 tablet, Rfl: 3   Lancets MISC, Use to check blood sugars twice a day, Disp: 200 each, Rfl: 0   latanoprost (XALATAN) 0.005 % ophthalmic solution, SMARTSIG:1 Drop(s) In Eye(s) Every Evening, Disp: , Rfl:    levothyroxine  (SYNTHROID ) 125 MCG tablet, TAKE 1 TABLET BY MOUTH EVERY DAY BEFORE BREAKFAST, Disp: 90 tablet, Rfl: 3   Magnesium  250 MG TABS, Take 250 mg by mouth daily., Disp: , Rfl:    meloxicam  (MOBIC ) 7.5 MG tablet, TAKE 1 TABLET BY MOUTH EVERY DAY AS NEEDED FOR PAIN, Disp: 90 tablet, Rfl: 1   Menthol , Topical Analgesic, (BENGAY EX), Apply 1 Application topically daily as needed (arthritis pain)., Disp: , Rfl:    metFORMIN  (GLUCOPHAGE ) 1000 MG tablet, Take 1 tablet (1,000 mg total) by mouth 2 (two) times daily with a meal., Disp: 180 tablet, Rfl: 3   Na Sulfate-K Sulfate-Mg Sulfate concentrate (SUPREP BOWEL PREP KIT) 17.5-3.13-1.6 GM/177ML SOLN, Take 1 kit (354 mLs total) by mouth as directed. For  colonoscopy prep, Disp: 354 mL, Rfl: 0   omeprazole  (PRILOSEC) 40 MG capsule, Take 1 capsule (40 mg total) by mouth in the morning and at bedtime. Twice a day for 2 months, then decrease to once daily. If started on blood thinners continue twice daily., Disp: 120 capsule, Rfl: 5   pioglitazone  (ACTOS ) 30 MG tablet, Take 1 tablet (30 mg total) by mouth daily., Disp: 90 tablet, Rfl: 3   rivaroxaban  (XARELTO ) 20 MG TABS tablet, Take 1 tablet (20 mg total) by mouth daily with supper., Disp: 90 tablet, Rfl: 3   traMADol  (ULTRAM ) 50 MG tablet, Take 1 tablet (50 mg total) by mouth every 8 (eight) hours as needed., Disp: 90 tablet, Rfl: 5 [2] No Known Allergies  "

## 2024-08-04 ENCOUNTER — Telehealth: Payer: Self-pay

## 2024-08-04 ENCOUNTER — Other Ambulatory Visit: Payer: Self-pay

## 2024-08-04 ENCOUNTER — Other Ambulatory Visit: Payer: Self-pay | Admitting: Internal Medicine

## 2024-08-04 NOTE — Telephone Encounter (Signed)
" °  Follow up Call-     08/03/2024    1:25 PM 11/04/2023    9:08 AM  Call back number  Post procedure Call Back phone  # [phone]  Permission to leave phone message Yes Yes     Patient questions:  Do you have a fever, pain , or abdominal swelling? No. Pain Score  0 *  Have you tolerated food without any problems? Yes.    Have you been able to return to your normal activities? Yes.    Do you have any questions about your discharge instructions: Diet   No. Medications  No. Follow up visit  No.  Do you have questions or concerns about your Care? No.  Actions: * If pain score is 4 or above: No action needed, pain <4.   "

## 2024-08-06 ENCOUNTER — Ambulatory Visit: Payer: Self-pay | Admitting: Gastroenterology

## 2024-08-06 LAB — SURGICAL PATHOLOGY

## 2024-08-06 NOTE — Telephone Encounter (Signed)
 Incoming call from pt requesting to discuss results. Pt stated she read them but does not understand. Please advise. Thank you.

## 2024-08-06 NOTE — Telephone Encounter (Signed)
 Patient notified that her pathology was benign.  She is notified that Dr. Wilhelmenia will send her a letter once he takes a look at it.  She thanked me for the call

## 2024-08-31 ENCOUNTER — Inpatient Hospital Stay: Attending: Nurse Practitioner

## 2024-09-07 ENCOUNTER — Inpatient Hospital Stay: Admitting: Nurse Practitioner

## 2024-12-13 ENCOUNTER — Ambulatory Visit

## 2025-01-18 ENCOUNTER — Encounter: Admitting: Family Medicine
